# Patient Record
Sex: Female | Born: 1945 | Race: White | Hispanic: No | Marital: Married | Smoking: Never smoker
Health system: Southern US, Community
[De-identification: ages and names within clinical notes are randomized; demographics above are authoritative.]

## PROBLEM LIST (undated history)

## (undated) DIAGNOSIS — E7800 Pure hypercholesterolemia, unspecified: Secondary | ICD-10-CM

## (undated) DIAGNOSIS — C801 Malignant (primary) neoplasm, unspecified: Secondary | ICD-10-CM

## (undated) DIAGNOSIS — S85001A Unspecified injury of popliteal artery, right leg, initial encounter: Secondary | ICD-10-CM

## (undated) DIAGNOSIS — R7303 Prediabetes: Secondary | ICD-10-CM

## (undated) DIAGNOSIS — K631 Perforation of intestine (nontraumatic): Secondary | ICD-10-CM

## (undated) DIAGNOSIS — I480 Paroxysmal atrial fibrillation: Secondary | ICD-10-CM

## (undated) DIAGNOSIS — I10 Essential (primary) hypertension: Secondary | ICD-10-CM

## (undated) DIAGNOSIS — IMO0002 Reserved for concepts with insufficient information to code with codable children: Secondary | ICD-10-CM

## (undated) DIAGNOSIS — E876 Hypokalemia: Secondary | ICD-10-CM

## (undated) DIAGNOSIS — I4891 Unspecified atrial fibrillation: Secondary | ICD-10-CM

## (undated) DIAGNOSIS — R600 Localized edema: Secondary | ICD-10-CM

## (undated) DIAGNOSIS — S83106A Unspecified dislocation of unspecified knee, initial encounter: Secondary | ICD-10-CM

## (undated) DIAGNOSIS — E669 Obesity, unspecified: Secondary | ICD-10-CM

## (undated) DIAGNOSIS — A4902 Methicillin resistant Staphylococcus aureus infection, unspecified site: Secondary | ICD-10-CM

## (undated) DIAGNOSIS — K5792 Diverticulitis of intestine, part unspecified, without perforation or abscess without bleeding: Secondary | ICD-10-CM

## (undated) DIAGNOSIS — E785 Hyperlipidemia, unspecified: Secondary | ICD-10-CM

## (undated) DIAGNOSIS — K219 Gastro-esophageal reflux disease without esophagitis: Secondary | ICD-10-CM

## (undated) DIAGNOSIS — S85009A Unspecified injury of popliteal artery, unspecified leg, initial encounter: Secondary | ICD-10-CM

## (undated) DIAGNOSIS — I82409 Acute embolism and thrombosis of unspecified deep veins of unspecified lower extremity: Secondary | ICD-10-CM

## (undated) DIAGNOSIS — R519 Headache, unspecified: Secondary | ICD-10-CM

## (undated) DIAGNOSIS — E119 Type 2 diabetes mellitus without complications: Secondary | ICD-10-CM

## (undated) DIAGNOSIS — D72829 Elevated white blood cell count, unspecified: Secondary | ICD-10-CM

## (undated) DIAGNOSIS — M7989 Other specified soft tissue disorders: Secondary | ICD-10-CM

## (undated) DIAGNOSIS — R87619 Unspecified abnormal cytological findings in specimens from cervix uteri: Secondary | ICD-10-CM

## (undated) DIAGNOSIS — M1990 Unspecified osteoarthritis, unspecified site: Secondary | ICD-10-CM

## (undated) DIAGNOSIS — R739 Hyperglycemia, unspecified: Secondary | ICD-10-CM

## (undated) HISTORY — DX: Other specified soft tissue disorders: M79.89

## (undated) HISTORY — PX: NASAL SINUS SURGERY: SHX719

## (undated) HISTORY — DX: Diverticulitis of intestine, part unspecified, without perforation or abscess without bleeding: K57.92

## (undated) HISTORY — PX: FINGER SURGERY: SHX640

## (undated) HISTORY — DX: Unspecified injury of popliteal artery, right leg, initial encounter: S85.001A

## (undated) HISTORY — PX: TONSILLECTOMY: SUR1361

## (undated) HISTORY — DX: Localized edema: R60.0

## (undated) HISTORY — DX: Paroxysmal atrial fibrillation: I48.0

## (undated) HISTORY — DX: Pure hypercholesterolemia, unspecified: E78.00

## (undated) HISTORY — DX: Elevated white blood cell count, unspecified: D72.829

## (undated) HISTORY — PX: OTHER SURGICAL HISTORY: SHX169

## (undated) HISTORY — DX: Perforation of intestine (nontraumatic): K63.1

## (undated) HISTORY — DX: Unspecified injury of popliteal artery, unspecified leg, initial encounter: S85.009A

## (undated) HISTORY — PX: NEUROMA SURGERY: SHX722

## (undated) HISTORY — DX: Essential (primary) hypertension: I10

## (undated) HISTORY — DX: Reserved for concepts with insufficient information to code with codable children: IMO0002

## (undated) HISTORY — DX: Type 2 diabetes mellitus without complications: E11.9

## (undated) HISTORY — PX: MELANOMA EXCISION: SHX5266

## (undated) HISTORY — PX: ADENOIDECTOMY: SUR15

## (undated) HISTORY — PX: CATARACT EXTRACTION, BILATERAL: SHX1313

## (undated) HISTORY — DX: Unspecified atrial fibrillation: I48.91

## (undated) HISTORY — DX: Hyperglycemia, unspecified: R73.9

## (undated) HISTORY — DX: Malignant (primary) neoplasm, unspecified: C80.1

## (undated) HISTORY — DX: Hyperlipidemia, unspecified: E78.5

## (undated) HISTORY — PX: ABDOMINAL HYSTERECTOMY: SHX81

## (undated) HISTORY — DX: Unspecified abnormal cytological findings in specimens from cervix uteri: R87.619

## (undated) HISTORY — DX: Unspecified dislocation of unspecified knee, initial encounter: S83.106A

## (undated) HISTORY — DX: Obesity, unspecified: E66.9

## (undated) HISTORY — DX: Hypokalemia: E87.6

## (undated) HISTORY — DX: Unspecified osteoarthritis, unspecified site: M19.90

## (undated) HISTORY — PX: CHOLECYSTECTOMY: SHX55

---

## 1998-01-11 ENCOUNTER — Ambulatory Visit (HOSPITAL_COMMUNITY): Admission: RE | Admit: 1998-01-11 | Discharge: 1998-01-11 | Payer: Self-pay | Admitting: Gastroenterology

## 1998-01-20 ENCOUNTER — Observation Stay (HOSPITAL_COMMUNITY): Admission: EM | Admit: 1998-01-20 | Discharge: 1998-01-20 | Payer: Self-pay | Admitting: Emergency Medicine

## 1998-11-12 ENCOUNTER — Other Ambulatory Visit: Admission: RE | Admit: 1998-11-12 | Discharge: 1998-11-12 | Payer: Self-pay | Admitting: Gynecology

## 1999-04-18 ENCOUNTER — Encounter: Admission: RE | Admit: 1999-04-18 | Discharge: 1999-04-18 | Payer: Self-pay | Admitting: Gynecology

## 1999-04-18 ENCOUNTER — Encounter: Payer: Self-pay | Admitting: Gynecology

## 1999-05-07 ENCOUNTER — Encounter: Admission: RE | Admit: 1999-05-07 | Discharge: 1999-05-07 | Payer: Self-pay | Admitting: Family Medicine

## 1999-05-07 ENCOUNTER — Encounter: Payer: Self-pay | Admitting: Family Medicine

## 1999-06-10 ENCOUNTER — Other Ambulatory Visit: Admission: RE | Admit: 1999-06-10 | Discharge: 1999-06-10 | Payer: Self-pay | Admitting: Gynecology

## 1999-07-14 ENCOUNTER — Encounter: Payer: Self-pay | Admitting: *Deleted

## 1999-07-14 ENCOUNTER — Encounter: Admission: RE | Admit: 1999-07-14 | Discharge: 1999-07-14 | Payer: Self-pay | Admitting: *Deleted

## 2000-06-04 ENCOUNTER — Encounter: Admission: RE | Admit: 2000-06-04 | Discharge: 2000-06-04 | Payer: Self-pay | Admitting: Gynecology

## 2000-06-04 ENCOUNTER — Encounter: Payer: Self-pay | Admitting: Gynecology

## 2000-06-09 ENCOUNTER — Other Ambulatory Visit: Admission: RE | Admit: 2000-06-09 | Discharge: 2000-06-09 | Payer: Self-pay | Admitting: Gynecology

## 2001-06-07 ENCOUNTER — Encounter: Admission: RE | Admit: 2001-06-07 | Discharge: 2001-06-07 | Payer: Self-pay | Admitting: Gynecology

## 2001-06-07 ENCOUNTER — Encounter: Payer: Self-pay | Admitting: Gynecology

## 2001-06-21 ENCOUNTER — Other Ambulatory Visit: Admission: RE | Admit: 2001-06-21 | Discharge: 2001-06-21 | Payer: Self-pay | Admitting: Gynecology

## 2001-11-11 ENCOUNTER — Encounter (INDEPENDENT_AMBULATORY_CARE_PROVIDER_SITE_OTHER): Payer: Self-pay | Admitting: Specialist

## 2001-11-11 ENCOUNTER — Observation Stay (HOSPITAL_COMMUNITY): Admission: RE | Admit: 2001-11-11 | Discharge: 2001-11-12 | Payer: Self-pay

## 2002-06-09 ENCOUNTER — Encounter: Admission: RE | Admit: 2002-06-09 | Discharge: 2002-06-09 | Payer: Self-pay | Admitting: Gynecology

## 2002-06-09 ENCOUNTER — Encounter: Payer: Self-pay | Admitting: Gynecology

## 2002-07-25 ENCOUNTER — Other Ambulatory Visit: Admission: RE | Admit: 2002-07-25 | Discharge: 2002-07-25 | Payer: Self-pay | Admitting: Gynecology

## 2002-09-28 ENCOUNTER — Encounter: Payer: Self-pay | Admitting: Gynecology

## 2002-09-28 ENCOUNTER — Encounter: Admission: RE | Admit: 2002-09-28 | Discharge: 2002-09-28 | Payer: Self-pay | Admitting: Gynecology

## 2003-06-26 ENCOUNTER — Encounter: Admission: RE | Admit: 2003-06-26 | Discharge: 2003-06-26 | Payer: Self-pay | Admitting: Gynecology

## 2003-07-06 ENCOUNTER — Encounter: Admission: RE | Admit: 2003-07-06 | Discharge: 2003-07-06 | Payer: Self-pay | Admitting: Gynecology

## 2003-07-23 ENCOUNTER — Ambulatory Visit (HOSPITAL_COMMUNITY): Admission: RE | Admit: 2003-07-23 | Discharge: 2003-07-23 | Payer: Self-pay | Admitting: Gastroenterology

## 2003-09-11 ENCOUNTER — Other Ambulatory Visit: Admission: RE | Admit: 2003-09-11 | Discharge: 2003-09-11 | Payer: Self-pay | Admitting: Gynecology

## 2004-07-09 ENCOUNTER — Encounter: Admission: RE | Admit: 2004-07-09 | Discharge: 2004-07-09 | Payer: Self-pay | Admitting: Gynecology

## 2004-09-17 ENCOUNTER — Other Ambulatory Visit: Admission: RE | Admit: 2004-09-17 | Discharge: 2004-09-17 | Payer: Self-pay | Admitting: Gynecology

## 2005-07-29 ENCOUNTER — Encounter: Admission: RE | Admit: 2005-07-29 | Discharge: 2005-07-29 | Payer: Self-pay | Admitting: Gynecology

## 2005-11-18 ENCOUNTER — Other Ambulatory Visit: Admission: RE | Admit: 2005-11-18 | Discharge: 2005-11-18 | Payer: Self-pay | Admitting: Gynecology

## 2006-06-16 ENCOUNTER — Emergency Department (HOSPITAL_COMMUNITY): Admission: EM | Admit: 2006-06-16 | Discharge: 2006-06-17 | Payer: Self-pay | Admitting: Emergency Medicine

## 2006-08-20 ENCOUNTER — Encounter: Admission: RE | Admit: 2006-08-20 | Discharge: 2006-08-20 | Payer: Self-pay | Admitting: Family Medicine

## 2006-12-23 ENCOUNTER — Other Ambulatory Visit: Admission: RE | Admit: 2006-12-23 | Discharge: 2006-12-23 | Payer: Self-pay | Admitting: Gynecology

## 2007-09-01 ENCOUNTER — Encounter: Admission: RE | Admit: 2007-09-01 | Discharge: 2007-09-01 | Payer: Self-pay | Admitting: Family Medicine

## 2007-12-14 ENCOUNTER — Other Ambulatory Visit: Admission: RE | Admit: 2007-12-14 | Discharge: 2007-12-14 | Payer: Self-pay | Admitting: Obstetrics and Gynecology

## 2008-09-04 ENCOUNTER — Encounter: Admission: RE | Admit: 2008-09-04 | Discharge: 2008-09-04 | Payer: Self-pay | Admitting: Family Medicine

## 2008-12-27 ENCOUNTER — Inpatient Hospital Stay (HOSPITAL_COMMUNITY): Admission: RE | Admit: 2008-12-27 | Discharge: 2008-12-31 | Payer: Self-pay | Admitting: Orthopedic Surgery

## 2008-12-27 HISTORY — PX: TOTAL KNEE ARTHROPLASTY: SHX125

## 2009-05-22 ENCOUNTER — Encounter: Admission: RE | Admit: 2009-05-22 | Discharge: 2009-05-22 | Payer: Self-pay | Admitting: Obstetrics and Gynecology

## 2009-06-20 ENCOUNTER — Inpatient Hospital Stay (HOSPITAL_COMMUNITY): Admission: RE | Admit: 2009-06-20 | Discharge: 2009-06-23 | Payer: Self-pay | Admitting: Orthopedic Surgery

## 2009-06-20 HISTORY — PX: TOTAL KNEE ARTHROPLASTY: SHX125

## 2009-09-10 ENCOUNTER — Encounter: Admission: RE | Admit: 2009-09-10 | Discharge: 2009-09-10 | Payer: Self-pay | Admitting: Obstetrics and Gynecology

## 2010-02-19 ENCOUNTER — Ambulatory Visit: Payer: Self-pay | Admitting: Cardiovascular Disease

## 2010-06-24 ENCOUNTER — Telehealth (INDEPENDENT_AMBULATORY_CARE_PROVIDER_SITE_OTHER): Payer: Self-pay | Admitting: Radiology

## 2010-06-25 ENCOUNTER — Encounter (HOSPITAL_COMMUNITY)
Admission: RE | Admit: 2010-06-25 | Discharge: 2010-07-08 | Payer: Self-pay | Source: Home / Self Care | Attending: Cardiovascular Disease | Admitting: Cardiovascular Disease

## 2010-06-25 ENCOUNTER — Encounter: Payer: Self-pay | Admitting: Internal Medicine

## 2010-06-25 ENCOUNTER — Ambulatory Visit: Admission: RE | Admit: 2010-06-25 | Discharge: 2010-06-25 | Payer: Self-pay | Source: Home / Self Care

## 2010-06-26 ENCOUNTER — Ambulatory Visit: Admission: RE | Admit: 2010-06-26 | Discharge: 2010-06-26 | Payer: Self-pay | Source: Home / Self Care

## 2010-07-08 ENCOUNTER — Ambulatory Visit: Payer: Self-pay | Admitting: Cardiovascular Disease

## 2010-07-10 NOTE — Assessment & Plan Note (Signed)
Summary: Cardiology Nuclear Testing  Nuclear Med Background Indications for Stress Test: Evaluation for Ischemia, Surgical Clearance  Indications Comments: Pending hysterectomy on 07/22/10 by Dr. Meredeth Ide (GYN)  History: Echo, Myocardial Perfusion Study  History Comments: '03 MPS nml.-EF=63%  '08Echo=55-60%/mild AS. HX. PAF  Symptoms: Chest Pain, Chest Tightness, Chest Tightness with Exertion, DOE, Nausea, Rapid HR, SOB  Symptoms Comments: Rt sided CP, last episode one week ago.   Nuclear Pre-Procedure Cardiac Risk Factors: Hypertension, NIDDM Caffeine/Decaff Intake: None NPO After: 7:30 PM Lungs: Clear IV 0.9% NS with Angio Cath: 22g     IV Site: R Antecubital IV Started by: Irean Hong, RN Chest Size (in) 46     Cup Size D     Height (in): 70 Weight (lb): 283 BMI: 40.75  Nuclear Med Study 1 or 2 day study:  2 day     Stress Test Type:  Treadmill/Lexiscan Reading MD:  Dietrich Pates, MD     Referring MD:  Katherina Right Resting Radionuclide:  Technetium 9m Tetrofosmin     Resting Radionuclide Dose:  33 mCi  Stress Radionuclide:  Technetium 9m Tetrofosmin     Stress Radionuclide Dose:  33 mCi   Stress Protocol      Max HR:  115 bpm     Predicted Max HR:  157 bpm  Max Systolic BP: 157 mm Hg     Percent Max HR:  73.25 %Rate Pressure Product:  16109  Lexiscan: 0.4 mg   Stress Test Technologist:  Irean Hong,  RN     Nuclear Technologist:  Doyne Keel, CNMT  Rest Procedure  Myocardial perfusion imaging was performed at rest 45 minutes following the intravenous administration of Technetium 16m Tetrofosmin.  Stress Procedure  The patient received IV Lexiscan 0.4 mg over 15-seconds with concurrent low level exercise and then Technetium 95m Tetrofosmin was injected at 30-seconds. There were nonspecific T wave changes. The patient had mild right sided chest pain with lexiscan. Quantitative spect images were obtained after a 45 minute delay.  QPS Raw Data Images:  Images  were motion corrected.  SOft tissue (diaphragm, breast) surround heart. Stress Images:  Defect in the inferior wall (base, mid, distal); minimally apex.  Otherwise normal perfusion. Rest Images:  Improvemnet with increased counts in the inferior wall (mid (minimally), distal and apex. Transient Ischemic Dilatation:  .91  (Normal <1.22)  Lung/Heart Ratio:  .31  (Normal <0.45)  Quantitative Gated Spect Images QGS EDV:  115 ml QGS ESV:  41 ml QGS EF:  64 % QGS cine images:  Normal wall motion.   Overall Impression  Exercise Capacity: Lexiscan with low level exercise BP Response: Normal blood pressure response. Clinical Symptoms: Right sided chest pain. ECG Impression: No significant ST segment change suggestive of ischemia. Overall Impression Comments: Inferior changes consistent with soft tissue attenuation and possible subendocardial scar with concomitant mid/distal inferior and apical ischemia.   Appended Document: Cardiology Nuclear Testing COPY SENT TO DR. Melburn Popper

## 2010-07-10 NOTE — Progress Notes (Signed)
Summary: nuc pre-procedure  Phone Note Outgoing Call   Call placed by: Domenic Polite, CNMT,  June 24, 2010 3:59 PM Call placed to: Patient Reason for Call: Confirm/change Appt Summary of Call: Left message with information on Myoview Information Sheet (see scanned document for details).      Nuclear Med Background Indications for Stress Test: Evaluation for Ischemia   History: Echo, Myocardial Perfusion Study  History Comments: '03 MPS nml.-EF=63%; '08 Echo=55-60%/mild AS; PAF     Nuclear Pre-Procedure Cardiac Risk Factors: Hypertension

## 2010-07-11 ENCOUNTER — Inpatient Hospital Stay (HOSPITAL_BASED_OUTPATIENT_CLINIC_OR_DEPARTMENT_OTHER)
Admission: RE | Admit: 2010-07-11 | Discharge: 2010-07-11 | Disposition: A | Discharge status: Home / Self Care | Payer: BC Managed Care – PPO | Attending: Cardiovascular Disease | Admitting: Cardiovascular Disease

## 2010-07-11 DIAGNOSIS — R079 Chest pain, unspecified: Secondary | ICD-10-CM | POA: Insufficient documentation

## 2010-07-11 DIAGNOSIS — E669 Obesity, unspecified: Secondary | ICD-10-CM | POA: Insufficient documentation

## 2010-07-11 DIAGNOSIS — I10 Essential (primary) hypertension: Secondary | ICD-10-CM | POA: Insufficient documentation

## 2010-07-11 DIAGNOSIS — Z96659 Presence of unspecified artificial knee joint: Secondary | ICD-10-CM | POA: Insufficient documentation

## 2010-07-11 DIAGNOSIS — R9430 Abnormal result of cardiovascular function study, unspecified: Secondary | ICD-10-CM | POA: Insufficient documentation

## 2010-07-11 DIAGNOSIS — I4891 Unspecified atrial fibrillation: Secondary | ICD-10-CM | POA: Insufficient documentation

## 2010-07-11 HISTORY — PX: CARDIAC CATHETERIZATION: SHX172

## 2010-07-14 LAB — POCT I-STAT GLUCOSE
Glucose, Bld: 110 mg/dL — ABNORMAL HIGH (ref 70–99)
Operator id: 221371

## 2010-07-15 ENCOUNTER — Encounter (HOSPITAL_COMMUNITY)
Admission: RE | Admit: 2010-07-15 | Discharge: 2010-07-15 | Disposition: A | Discharge status: Home / Self Care | Payer: BC Managed Care – PPO | Source: Ambulatory Visit

## 2010-07-15 DIAGNOSIS — Z01812 Encounter for preprocedural laboratory examination: Secondary | ICD-10-CM | POA: Insufficient documentation

## 2010-07-15 LAB — COMPREHENSIVE METABOLIC PANEL
ALT: 26 U/L (ref 0–35)
AST: 24 U/L (ref 0–37)
Albumin: 3.9 g/dL (ref 3.5–5.2)
Alkaline Phosphatase: 75 U/L (ref 39–117)
Calcium: 9.6 mg/dL (ref 8.4–10.5)
GFR calc Af Amer: >60 mL/min (ref >60)
Potassium: 3.1 mEq/L — ABNORMAL LOW (ref 3.5–5.1)
Sodium: 138 mEq/L (ref 135–145)
Total Protein: 6.9 g/dL (ref 6.0–8.3)

## 2010-07-15 LAB — CBC
MCHC: 33.5 g/dL (ref 30.0–36.0)
Platelets: 236 10*3/uL (ref 150–400)
RDW: 14.5 % (ref 11.5–15.5)
WBC: 6.2 10*3/uL (ref 4.0–10.5)

## 2010-07-15 LAB — SURGICAL PCR SCREEN: Staphylococcus aureus: NEGATIVE

## 2010-07-18 NOTE — H&P (Signed)
NAME:  AALINA, BREGE NO.:  [id number]  MEDICAL RECORD NO.:  [id number]           PATIENT TYPE:  LOCATION:                                 FACILITY:  PHYSICIAN:  Vesta Mixer, M.D. DATE OF BIRTH:  [birth year]  DATE OF ADMISSION:  07/11/2010 DATE OF DISCHARGE:                             HISTORY & PHYSICAL   [name] is a middle-aged female with a history of some chest discomfort.  She recently had an abnormal stress Myoview study and is now admitted for cardiac catheterization.  [name] is a middle-aged female with a history of hypertension, obesity, arthritis, chest pain, intermittent atrial fibrillation, and a knee replacement.  She has been having some episodes of right chest and right shoulder pain.  The pain radiates out to her right arm.  It also is associated with some back pain.  She denies any shortness of breath. She has had some nausea.  The pain also makes her fatigued, and she just does not feel right.  She is scheduled to have a hysterectomy next month.  We scheduled her for a stress Myoview study for the purpose of preoperative evaluation and also to evaluate her episodes of chest pain. The stress Myoview study revealed inferior wall ischemia.  She has normal left ventricular systolic function with an ejection fraction of 64%.  CURRENT MEDICATIONS: 1. Maxzide 75 mg/50 mg once a day. 2. Calcium and vitamin D twice a day. 3. Aspirin 325 mg a day. 4. Multivitamin once a day. 5. Fish oil once a day. 6. Diltiazem slow release 240 mg a day. 7. Vitamin D 50,000 International Units a week.  ALLERGIES:  She is allergic to SULFA.  PLAVIX causes itching. ERYTHROMYCIN causes nausea.  PAST MEDICAL HISTORY: 1. Hypertension. 2. Obesity. 3. Arthritis. 4. History of chest pain. 5. Intermittent atrial fibrillation. 6. History of left knee replacement.  FAMILY HISTORY:  Her father died at age 33 of emphysema.  Her mother has a history of  pacemaker.  SOCIAL HISTORY:  The patient is a nonsmoker.  She is a [employer] scheme manager.  REVIEW OF SYSTEMS:  Reported in the HPI.  All other systems are negative.  PHYSICAL EXAMINATION:  GENERAL:  She is a middle-aged female, in no acute distress.  She is alert and oriented x3.  Mood and affect are normal. VITAL SIGNS:  Her weight is 283.  Her blood pressure is 130/78.  Her heart rate is 76. HEENT AND NECK:  Her carotids exam is normal.  She has no bruits.  Her neck is supple.  Her sclerae are nonicteric.  There is no thyromegaly. No JVD. LUNGS:  Clear. HEART:  Regular rate S1 and S2.  There are no murmurs.  The PMI is nondisplaced. ABDOMEN:  Good bowel sounds and is nontender.  There is no hepatosplenomegaly. EXTREMITIES:  She has no edema.  There are no palpable cords.  Her pulses are very deep. NEUROLOGIC:  Cranial nerves II through XII are intact, and motor and sensory function intact.  Stress Myoview reveals inferior wall ischemia.  She has normal left ventricular systolic function.  Laboratory  data is pending.  [name] presents with a rather atypical episode of chest pain but an abnormal stress Myoview study.  I cannot completely exclude diaphragmatic attenuation.  We will schedule her for heart catheterization for further evaluation.  I discussed the risks, benefits, and options of heart catheterization.  She understands and agrees to proceed.     Vesta Mixer, M.D.     PJN/MEDQ  D:  07/08/2010  T:  07/09/2010  Job:  [id number]  cc:   Windle Guard, M.D.  Electronically Signed by Kristeen Miss M.D. on 07/18/2010 04:50:57 PM

## 2010-07-20 NOTE — Procedures (Signed)
  NAME:  TIPHANY, FAYSON NO.:  [id number]  MEDICAL RECORD NO.:  [id number]           PATIENT TYPE:  LOCATION:                                 FACILITY:  PHYSICIAN:  Vesta Mixer, M.D. DATE OF BIRTH:  April 10, 1946  DATE OF PROCEDURE:  07/11/2010 DATE OF DISCHARGE:                           CARDIAC CATHETERIZATION   [name] is a middle-aged female with a history of atrial fibrillation, hypertension, obesity, and status post knee replacement. She has had some episodes of chest pain.  She is scheduled to have a hysterectomy.  She had a preoperative stress Myoview study which revealed some abnormalities.  She is scheduled for heart catheterization for further evaluation.  PROCEDURE:  Left heart catheterization with coronary angiography.  The right femoral artery was easily cannulated using the modified Seldinger technique.  HEMODYNAMICS:  The LV pressure is 138/13.  The aortic pressure is 139/75.  ANGIOGRAPHY: 1. Left main:  The left main is smooth and normal. 2. Left anterior descending artery:  The LAD is smooth and normal.     There is a large first diagonal artery which is also smooth and     normal. 3. Ramus intermediate branch:  The ramus intermediate branch is smooth     and normal. 4. Left circumflex artery:  Left circumflex artery is smooth and     normal.  It primarily provides flow to an obtuse marginal branch.     The continuation branch is a very tiny branch that does not give     off any additional marginal branches. 5. Right coronary artery:  The right coronary artery is smooth and     normal.  It is dominant.  The posterior descending artery and the     posterolateral segment artery are normal. 6. Left ventriculogram:  The left ventriculogram was performed in 30     RAO position.  It reveals normal left ventricular systolic     function.  The ejection fraction is around 60% to 65%.  COMPLICATIONS:  None.  CONCLUSION: 1. Smooth and  normal coronary arteries. 2. Normal left ventricular systolic function.  She should be at low     risk for her upcoming surgery.     Vesta Mixer, M.D.     PJN/MEDQ  D:  07/11/2010  T:  07/11/2010  Job:  [id number]  cc:   Windle Guard, M.D.  Electronically Signed by Kristeen Miss M.D. on 07/18/2010 04:50:56 PM

## 2010-07-22 ENCOUNTER — Other Ambulatory Visit: Payer: Self-pay | Admitting: Obstetrics and Gynecology

## 2010-07-22 ENCOUNTER — Ambulatory Visit (HOSPITAL_COMMUNITY)
Admission: RE | Admit: 2010-07-22 | Discharge: 2010-07-23 | Disposition: A | Discharge status: Home / Self Care | Payer: BC Managed Care – PPO | Source: Ambulatory Visit | Attending: Obstetrics and Gynecology | Admitting: Obstetrics and Gynecology

## 2010-07-22 DIAGNOSIS — I4891 Unspecified atrial fibrillation: Secondary | ICD-10-CM | POA: Insufficient documentation

## 2010-07-22 DIAGNOSIS — I10 Essential (primary) hypertension: Secondary | ICD-10-CM | POA: Insufficient documentation

## 2010-07-22 DIAGNOSIS — N812 Incomplete uterovaginal prolapse: Secondary | ICD-10-CM | POA: Insufficient documentation

## 2010-07-22 DIAGNOSIS — E669 Obesity, unspecified: Secondary | ICD-10-CM | POA: Insufficient documentation

## 2010-07-22 DIAGNOSIS — Z01812 Encounter for preprocedural laboratory examination: Secondary | ICD-10-CM | POA: Insufficient documentation

## 2010-07-22 HISTORY — PX: CYSTOCELE REPAIR: SHX163

## 2010-07-22 HISTORY — PX: TOTAL VAGINAL HYSTERECTOMY: SHX2548

## 2010-07-22 LAB — BASIC METABOLIC PANEL
Calcium: 9.2 mg/dL (ref 8.4–10.5)
GFR calc Af Amer: >60 mL/min (ref >60)
GFR calc non Af Amer: >60 mL/min (ref >60)
Glucose, Bld: 154 mg/dL — ABNORMAL HIGH (ref 70–99)
Sodium: 139 mEq/L (ref 135–145)

## 2010-07-22 LAB — GLUCOSE, CAPILLARY

## 2010-07-22 LAB — HEMOGLOBIN AND HEMATOCRIT, BLOOD
HCT: 42.9 % (ref 36.0–46.0)
Hemoglobin: 13.9 g/dL (ref 12.0–15.0)

## 2010-07-23 LAB — BASIC METABOLIC PANEL
BUN: 7 mg/dL (ref 6–23)
CO2: 35 mEq/L — ABNORMAL HIGH (ref 19–32)
Calcium: 9 mg/dL (ref 8.4–10.5)
Chloride: 101 mEq/L (ref 96–112)
Creatinine, Ser: 0.73 mg/dL (ref 0.4–1.2)
GFR calc Af Amer: >60 mL/min (ref >60)
GFR calc non Af Amer: >60 mL/min (ref >60)
Glucose, Bld: 121 mg/dL — ABNORMAL HIGH (ref 70–99)
Potassium: 3.2 mEq/L — ABNORMAL LOW (ref 3.5–5.1)
Sodium: 140 mEq/L (ref 135–145)

## 2010-07-23 LAB — CBC
HCT: 37.8 % (ref 36.0–46.0)
Platelets: 188 10*3/uL (ref 150–400)
RBC: 4.17 MIL/uL (ref 3.87–5.11)
RDW: 13.8 % (ref 11.5–15.5)
WBC: 10.1 10*3/uL (ref 4.0–10.5)

## 2010-07-23 NOTE — Op Note (Signed)
NAMENA, WALDRIP NO.:  [id number]  MEDICAL RECORD NO.:  [id number]           PATIENT TYPE:  I  LOCATION:  9304                          FACILITY:  [facility]  PHYSICIAN:  Cynthia P. Romine, M.D.DATE OF BIRTH:  05-Sep-1945  DATE OF PROCEDURE:  07/22/2010 DATE OF DISCHARGE:                              OPERATIVE REPORT   PREOPERATIVE DIAGNOSIS:  Pelvic prolapse.  POSTOPERATIVE DIAGNOSIS:  Pelvic prolapse, pathology pending.  PROCEDURE:  Laparoscopically-assisted total vaginal hysterectomy with bilateral salpingo-oophorectomy by Dr. Tresa Res.  Pelvic repair done by Dr. Lorin Picket MacDiarmid to be dictated separately.  SURGEON:  Cynthia P. Romine, MD  ASSISTANT:  Lum Keas, MD  ANESTHESIA:  General endotracheal.  ESTIMATED BLOOD LOSS:  150 mL.  COMPLICATIONS:  None.  PROCEDURE:  The patient was taken to the operating room and after induction of adequate general anesthesia was placed in the low dorsal lithotomy position and prepped and draped in the usual fashion.  A posterior weighted and anterior Deaver was placed.  The cervix was grasped at the anterior lip with a single-tooth tenaculum.  A Hulka uterine manipulator was placed.  The bladder was then drained with a Foley catheter.  Attention was then turned to the abdomen.  An area vertically just below the umbilicus was anesthetized with 1% Xylocaine, incised with a knife.  The Veress needle was inserted into peritoneal space.  Proper placement was tested using the hanging drop technique. When the gas was turned on, the pressure was over 15 and it was felt that the Veress needle was probably in the subcutaneous space since this patient had a rather obese abdomen.  The Veress needle was removed and it was reinserted making very certain to keep the angle of the Veress needle rather vertical and at this time the Veress needle was indeed in the peritoneal space.  A hanging drop test was done and the gas  was turned on and pneumoperitoneum was achieved using 3 liters of CO2.  A 10/11 mm bladed trocar was then introduced, but likely Veress needle was again found in the preperitoneal space, it was removed and a long 11/12 mm trocar was reinserted with good placement in the peritoneal cavity and placement documented with the laparoscope.  The abdominal wall was then transilluminated and 2 small areas in the right and left lower quadrant were identified that were free of vessels that were anesthetized with 1% Xylocaine, incised with a knife, and two 5-mm trocars were inserted under direct visualization.  The pelvis was inspected.  The uterus, tubes, and ovaries were all freely mobile.  The anterior and posterior cul-de-sacs were normal.  The ureter on the patient's right was identified.  The infundibulopelvic ligament was put on stretch, cauterized with a gyrus and cut.  The mesosalpinx and the round ligament were likewise cauterized and cut sequentially.  The procedure was then repeated on the patient's left identifying the ureter, cauterizing and cutting the infundibulopelvic ligament, mesosalpinx, and round ligament.  A small amount of broad ligament was also taken down with the gyrus.  The pedicles were inspected.  There was some small amount  of bleeding from the round ligament on the patient's right and this was re-cauterized.  There was also a very small amount of bleeding from the infundibulopelvic on the patient's left, which was re- cauterized and the pedicles were absolutely hemostatic.  The pneumoperitoneum was allowed to partially escape and the pedicles were reinspected and were still hemostatic.  The instruments were all removed from the abdomen including the sleeves.  Before the umbilical sleeve was removed, the patient was given a couple of manual deep breaths and the abdomen was left with the belly gas.  The sleeve was removed.  The deep stitch was placed in the umbilicus and  then the skin and the umbilicus was closed with 4-0 Vicryl Rapide and then all incisions were closed with off the field.  Attention was then turned vaginally.  The Hulka manipulator was removed.  A posterior weighted and anterior Deaver were placed.  The cervix was grasped on its anterior lip with a Jacobs tenaculum.  The mucosa over the cervix was infiltrated with 1% Xylocaine with epinephrine and incised with a knife from 9-3 o'clock.  Mayo scissors were used to dissect the bladder off the cervix as was blunt dissection using a sponge.  A posterior colpotomy incision was made next and the banana was placed in the posterior peritoneal space.  The uterosacral ligaments were clamped, cut and tied as were the pedicles going up the cardinal ligament.  The uterine arteries on either side were clamped, cut, and doubly tied.  There was a small amount of broad ligament left on either side that was clamped, cut, and tied and the specimen was then removed, uterus, cervix, tubes, and ovaries.  The vaginal cuff was then run with a running locking suture of 0 Vicryl from 3 o'clock to 9 o'clock.  An antienterocele stitch was placed to bring the uterosacral ligaments together going into the posterior peritoneal space from vagina, suturing through the left uterosacral ligament, skimming across peritoneum, suturing through the right, uterosacral ligament coming out again in the midline and tying.  The wound was irrigated.  It was felt to be hemostatic and then Dr. Sherron Monday came in to proceed with his part of the procedure which will be dictated separately.  Estimated blood loss for this part of the procedure was 150 mL.  There were no complications, and sponge, needle, and instrument counts were correct.     Cynthia P. Romine, M.D.     CPR/MEDQ  D:  07/22/2010  T:  07/23/2010  Job:  [id number]  Electronically Signed by Meredeth Ide M.D. on 07/23/2010 10:38:21 AM

## 2010-07-28 NOTE — Op Note (Signed)
[name], [name] NO.:  [id number]  MEDICAL RECORD NO.:  [id number]           PATIENT TYPE:  I  LOCATION:  9304                          FACILITY:  [facility]  PHYSICIAN:  Martina Sinner, MD DATE OF BIRTH:  05-May-1946  DATE OF PROCEDURE:  07/22/2010 DATE OF DISCHARGE:                              OPERATIVE REPORT   ASSISTANT:  Delia Chimes, NP  PREOPERATIVE DIAGNOSES:  Prolapse, cystocele repair and small rectocele.  POSTOPERATIVE DIAGNOSES:  Prolapse, cystocele repair and small rectocele.  SURGERY:  Vault suspension, cystocele repair, graft and cystoscopy.  Ms. Kolk underwent a transvaginal hysterectomy by Dr. Tresa Res.  She had run the posterior cuff between approximately 4 and 8 o'clock.  She had commented that there was some bulging at the apex posteriorly, where she had done the [facility].  Leg position was good.  Preoperative antibiotics were given.  Preoperative laboratory tests were normal.  I placed 2 Allis clamps at 2 and 10 o'clock and made a midline incision appropriate depth after instilling 25 mL of lidocaine epinephrine mixture.  I dissected the underlying pubocervical fascia from the overlying thick vaginal wall mucosa to nearly the white line bilaterally with sharp and blunt dissection.  There was clumping of tissue near her right apex that they do not take down since it was more posterior.  I could finger dissect to the ischial spine and sacrospinous ligament bilaterally.  There was very little bleeding.  She really had a short anterior vaginal wall with a small central defect and I did a two-layer repair with running 2-0 Vicryl not imbricating the bladder neck.  I then cystoscoped the patient.  Cystoscopically, the cystocele repair looked good.  There was excellent blue jets bilaterally with no distortion of the ureteral orifices and no distortion to the floor of the bladder on either side.  With a Capio device, I placed  a 0 Ethibond one fingerbreadth medial to the ischial spine in a straight line between the 2 spines.  I did 2 or 3 rectal examinations and I was very happy that there was no rectal injury and the suture was outside the rectum with no blood in the gloves.  I placed a 0 Vicryl on the UR6 needle into the pelvic sidewall near the urethrovesical angle.  I used my typical 10 x 6 dermal graft and cut in the shape of a trapezoid and sewed in place not under tension.  It fit very nicely.  I trimmed a minimal amount of anterior vaginal wall mucosa and closed it with a running 2-0 Vicryl on a CT1 needle.  She had a very heaped up vaginal cuff and I closed this with 0 Vicryl on a CT1 needle.  I repeated the rectal examination and she had minimal rectocele.  She lost a little bit of vaginal length and I did not want to narrow her vagina down further.  She did not have a distal rectocele.  I was very pleased with the anatomic repair.  Vaginal pack with Estrace cream was applied.  Leg position was the same at the end of the case.  ______________________________ Martina Sinner, MD     SAM/MEDQ  D:  07/22/2010  T:  07/23/2010  Job:  [id number]  Electronically Signed by Alfredo Martinez MD on 07/28/2010 05:19:22 PM

## 2010-08-24 LAB — COMPREHENSIVE METABOLIC PANEL
ALT: 21 U/L (ref 0–35)
Alkaline Phosphatase: 74 U/L (ref 39–117)
CO2: 30 mEq/L (ref 19–32)
GFR calc non Af Amer: >60 mL/min (ref >60)
Glucose, Bld: 99 mg/dL (ref 70–99)
Potassium: 3.5 mEq/L (ref 3.5–5.1)
Sodium: 140 mEq/L (ref 135–145)
Total Bilirubin: 0.4 mg/dL (ref 0.3–1.2)

## 2010-08-24 LAB — CBC
HCT: 32.2 % — ABNORMAL LOW (ref 36.0–46.0)
HCT: 27.1 % — ABNORMAL LOW (ref 36.0–46.0)
Hemoglobin: 11 g/dL — ABNORMAL LOW (ref 12.0–15.0)
Hemoglobin: 14.1 g/dL (ref 12.0–15.0)
Hemoglobin: 9.7 g/dL — ABNORMAL LOW (ref 12.0–15.0)
Hemoglobin: 9.3 g/dL — ABNORMAL LOW (ref 12.0–15.0)
MCHC: 34 g/dL (ref 30.0–36.0)
MCHC: 34.2 g/dL (ref 30.0–36.0)
MCV: 88.5 fL (ref 78.0–100.0)
MCV: 88.9 fL (ref 78.0–100.0)
RBC: 3.22 MIL/uL — ABNORMAL LOW (ref 3.87–5.11)
RBC: 3.63 MIL/uL — ABNORMAL LOW (ref 3.87–5.11)
RBC: 4.65 MIL/uL (ref 3.87–5.11)
RBC: 3.07 MIL/uL — ABNORMAL LOW (ref 3.87–5.11)
RDW: 14.7 % (ref 11.5–15.5)
WBC: 9.7 10*3/uL (ref 4.0–10.5)

## 2010-08-24 LAB — PROTIME-INR
INR: 1.03 (ref 0.00–1.49)
INR: 1.51 — ABNORMAL HIGH (ref 0.00–1.49)
Prothrombin Time: 12.6 seconds (ref 11.6–15.2)
Prothrombin Time: 14.8 seconds (ref 11.6–15.2)

## 2010-08-24 LAB — GLUCOSE, CAPILLARY: Glucose-Capillary: 99 mg/dL (ref 70–99)

## 2010-08-24 LAB — BASIC METABOLIC PANEL
CO2: 28 mEq/L (ref 19–32)
CO2: 29 mEq/L (ref 19–32)
Chloride: 104 mEq/L (ref 96–112)
Chloride: 106 mEq/L (ref 96–112)
Creatinine, Ser: 0.75 mg/dL (ref 0.4–1.2)
GFR calc Af Amer: >60 mL/min (ref >60)
GFR calc Af Amer: >60 mL/min (ref >60)
Glucose, Bld: 132 mg/dL — ABNORMAL HIGH (ref 70–99)
Potassium: 4.2 mEq/L (ref 3.5–5.1)
Sodium: 138 mEq/L (ref 135–145)
Sodium: 141 mEq/L (ref 135–145)

## 2010-08-24 LAB — URINALYSIS, ROUTINE W REFLEX MICROSCOPIC
Glucose, UA: NEGATIVE mg/dL
Hgb urine dipstick: NEGATIVE
Ketones, ur: NEGATIVE mg/dL
Protein, ur: NEGATIVE mg/dL

## 2010-08-25 ENCOUNTER — Other Ambulatory Visit: Payer: Self-pay | Admitting: Obstetrics and Gynecology

## 2010-08-25 DIAGNOSIS — Z1231 Encounter for screening mammogram for malignant neoplasm of breast: Secondary | ICD-10-CM

## 2010-09-14 LAB — PROTIME-INR
INR: 0.9 (ref 0.00–1.49)
INR: 1 (ref 0.00–1.49)
INR: 1.2 (ref 0.00–1.49)
INR: 1.2 (ref 0.00–1.49)
INR: 1.4 (ref 0.00–1.49)
Prothrombin Time: 12.3 seconds (ref 11.6–15.2)
Prothrombin Time: 17.4 seconds — ABNORMAL HIGH (ref 11.6–15.2)

## 2010-09-14 LAB — GLUCOSE, CAPILLARY
Glucose-Capillary: 101 mg/dL — ABNORMAL HIGH (ref 70–99)
Glucose-Capillary: 113 mg/dL — ABNORMAL HIGH (ref 70–99)
Glucose-Capillary: 113 mg/dL — ABNORMAL HIGH (ref 70–99)
Glucose-Capillary: 114 mg/dL — ABNORMAL HIGH (ref 70–99)
Glucose-Capillary: 119 mg/dL — ABNORMAL HIGH (ref 70–99)
Glucose-Capillary: 124 mg/dL — ABNORMAL HIGH (ref 70–99)
Glucose-Capillary: 128 mg/dL — ABNORMAL HIGH (ref 70–99)
Glucose-Capillary: 137 mg/dL — ABNORMAL HIGH (ref 70–99)
Glucose-Capillary: 148 mg/dL — ABNORMAL HIGH (ref 70–99)

## 2010-09-14 LAB — COMPREHENSIVE METABOLIC PANEL
Alkaline Phosphatase: 67 U/L (ref 39–117)
BUN: 23 mg/dL (ref 6–23)
Chloride: 105 mEq/L (ref 96–112)
Glucose, Bld: 97 mg/dL (ref 70–99)
Potassium: 4.4 mEq/L (ref 3.5–5.1)
Total Bilirubin: 0.8 mg/dL (ref 0.3–1.2)

## 2010-09-14 LAB — CBC
HCT: 28.9 % — ABNORMAL LOW (ref 36.0–46.0)
HCT: 29.8 % — ABNORMAL LOW (ref 36.0–46.0)
HCT: 41.5 % (ref 36.0–46.0)
Hemoglobin: 10.3 g/dL — ABNORMAL LOW (ref 12.0–15.0)
Hemoglobin: 11.1 g/dL — ABNORMAL LOW (ref 12.0–15.0)
Hemoglobin: 14.2 g/dL (ref 12.0–15.0)
Hemoglobin: 9.8 g/dL — ABNORMAL LOW (ref 12.0–15.0)
MCHC: 34.1 g/dL (ref 30.0–36.0)
MCV: 91.6 fL (ref 78.0–100.0)
RBC: 3.26 MIL/uL — ABNORMAL LOW (ref 3.87–5.11)
RBC: 3.57 MIL/uL — ABNORMAL LOW (ref 3.87–5.11)
RDW: 13.4 % (ref 11.5–15.5)
RDW: 13.7 % (ref 11.5–15.5)
WBC: 7.2 10*3/uL (ref 4.0–10.5)
WBC: 9.4 10*3/uL (ref 4.0–10.5)

## 2010-09-14 LAB — BASIC METABOLIC PANEL
CO2: 28 mEq/L (ref 19–32)
CO2: 32 mEq/L (ref 19–32)
Calcium: 8.5 mg/dL (ref 8.4–10.5)
Calcium: 8.7 mg/dL (ref 8.4–10.5)
Calcium: 9 mg/dL (ref 8.4–10.5)
Chloride: 98 mEq/L (ref 96–112)
GFR calc Af Amer: >60 mL/min (ref >60)
GFR calc Af Amer: >60 mL/min (ref >60)
GFR calc Af Amer: >60 mL/min (ref >60)
GFR calc non Af Amer: >60 mL/min (ref >60)
GFR calc non Af Amer: >60 mL/min (ref >60)
Glucose, Bld: 125 mg/dL — ABNORMAL HIGH (ref 70–99)
Potassium: 3.2 mEq/L — ABNORMAL LOW (ref 3.5–5.1)
Potassium: 3.6 mEq/L (ref 3.5–5.1)
Potassium: 4.2 mEq/L (ref 3.5–5.1)
Sodium: 135 mEq/L (ref 135–145)
Sodium: 137 mEq/L (ref 135–145)
Sodium: 138 mEq/L (ref 135–145)

## 2010-09-14 LAB — URINALYSIS, ROUTINE W REFLEX MICROSCOPIC
Bilirubin Urine: NEGATIVE
Hgb urine dipstick: NEGATIVE
Ketones, ur: NEGATIVE mg/dL
Protein, ur: NEGATIVE mg/dL
Urobilinogen, UA: 0.2 mg/dL (ref 0.0–1.0)

## 2010-09-23 ENCOUNTER — Ambulatory Visit
Admission: RE | Admit: 2010-09-23 | Discharge: 2010-09-23 | Disposition: A | Discharge status: Home / Self Care | Payer: BC Managed Care – PPO | Source: Ambulatory Visit | Attending: Obstetrics and Gynecology | Admitting: Obstetrics and Gynecology

## 2010-09-23 DIAGNOSIS — Z1231 Encounter for screening mammogram for malignant neoplasm of breast: Secondary | ICD-10-CM

## 2010-10-21 NOTE — Op Note (Signed)
[name], [name] NO.:  [id number]   MEDICAL RECORD NO.:  [id number]          PATIENT TYPE:  INP   LOCATION:  5036                         FACILITY:  [facility]   PHYSICIAN:  Vania Rea. Supple, M.D.  DATE OF BIRTH:  08-29-1945   DATE OF PROCEDURE:  12/27/2008  DATE OF DISCHARGE:                               OPERATIVE REPORT   PREOPERATIVE DIAGNOSIS:  End-stage right knee osteoarthrosis.   POSTOPERATIVE DIAGNOSIS:  End-stage right knee osteoarthrosis.   PROCEDURE:  Cemented right total knee arthroplasty utilizing a DePuy  Sigma implant, size 3 femur, size 4 tibia, 15-mm thick rotating platform  polyethylene insert, and a 35-mm patellar button.   SURGEON:  Vania Rea. Supple, MD   ASSISTANT:  Lucita Lora. Shuford, PA-C   ANESTHESIA:  General endotracheal as well as a femoral nerve block.   TOURNIQUET TIME:  61 minutes.   ESTIMATED BLOOD LOSS:  150 mL.   DRAINS:  Hemovac x1.   HISTORY:  [name] is a 65 year old female who has had chronic and  progressively increasing bilateral knee pain secondary to end-stage  arthrosis.  Due to her ongoing pain and functional limitations, she is  brought to the operating room at this time for planned right total knee  arthroplasty.   Preoperatively, I counseled [name] on treatment options as well as  risks versus benefits thereof.  Possible surgical complications of  bleeding, infection, neurovascular injury, DVT, PE as well as persistent  pain and possible need for revision surgery were all reviewed.  She  understands and accepts and agrees with our planned procedure.   PROCEDURE IN DETAIL:  After undergoing routine preop evaluation, the  patient received prophylactic antibiotics.  Femoral nerve block was  established in holding area by the Anesthesia Department.  Placed supine  on the operative table and underwent smooth induction of a general  endotracheal anesthesia.  A Foley catheter was placed.  Tourniquet was  applied in the right thigh.  Right leg was sterilely prepped and draped  in standard fashion.  Time-out was called.  Leg was exsanguinated with a  tourniquet inflated to 350 mmHg.  An anterior midline incision was then  made approximately 20 cm in length centered over the patella.  Skin  flaps were mobilized.  Medial parapatellar arthrotomy was performed.  The patella was everted.  Fat pad excised.  Knee was flexed up.  Drill  was then used to gain access to the femoral medullary canal and  intramedullary canal guide was then passed.  We made a 11-mm resection 5  degrees valgus alignment from the distal femur.  The distal femur was  then sized with the size 3 having the best fit.  The size 3 cutting  guide was then placed and the anterior-posterior chamfer cuts were then  made with care taken to protect the periarticular soft tissues.  Trial  implant showed excellent fit.  Attention was then turned to the proximal  tibia where we removed the remnants of the menisci and cruciate  ligaments.  A femoral extramedullary guide was then placed and a 10-mm  resection was then performed measured from the lateral plateau.  The  proximal tibia was then incised with a size 4 providing the best  coverage.  Trial size 4 was pinned into position and trial reduction was  performed.  Of note, we did perform a significant medial release because  she had severe varus deformity and this allowed excellent correction  with symmetric extension gap.  Trial reductions showed implants with  good position, good stability, and excellent knee motion.  We then  completed preparation of the proximal tibia using the reamer and broach.  We then returned to the distal femur where the box cutting guide was  then used to create a box cut with oscillating saw.  The box trial was  then placed again showing excellent fit and soft tissue balance.  Attention was then turned to the patella where an oscillating saw was  used to  resect approximately 8.5 mm of bone and stabilizing drill holes  were then placed for the 35-mm patellar button.  At this point, we then  used an osteotome to remove osteophytes from posterior aspect of the  femoral condyles and residual soft tissue debrided from posterior  compartment was removed.  Final inspection and irrigation was completed  and pulsatile lavage irrigation was performed with meticulous cleaning  of all cut surfaces.  Cement was mixed at the appropriate consistency.  All implants were then cemented into position.  Meticulous removal of  all extra cement was completed.  We then performed additional trial  reductions and the 15-mm poly implant showed the best soft tissue  balance.  The final 15-mm tray was then introduced and final reduction  performed.  Knee was taken through range of motion showed again full  extension and excellent stability.  A Hemovac drain was brought out  superolaterally.  The tourniquet was let down.  Hemostasis was obtained.  The parapatellar arthrotomy was closed with a series of interrupted  figure-of-eight #1 Vicryl sutures, 2-0 Vicryl was used for the subcu  layer, and intracuticular 3-0 Monocryl to the skin, followed by Steri-  Strips.  Bulky dry dressing was wrapped about the knee and leg was  wrapped with an Ace bandage, thigh support stocking, and ice-packs.  The  patient was then awakened, extubated, and taken to the recovery room in  stable condition.      Vania Rea. Supple, M.D.  Electronically Signed     KMS/MEDQ  D:  12/27/2008  T:  12/28/2008  Job:  [id number]

## 2010-10-24 NOTE — Op Note (Signed)
NAME:  [name], BRAFFORD                          ACCOUNT NO.:  [id number]   MEDICAL RECORD NO.:  [id number]                   PATIENT TYPE:  AMB   LOCATION:  ENDO                                 FACILITY:  [facility]   PHYSICIAN:  John C. Madilyn Fireman, M.D.                 DATE OF BIRTH:  1945/08/15   DATE OF PROCEDURE:  07/23/2003  DATE OF DISCHARGE:                                 OPERATIVE REPORT   PROCEDURE:  Esophagogastroduodenoscopy.   INDICATIONS FOR PROCEDURE:  Patient with ongoing chest pain of unclear  etiology, who was also undergoing screening colonoscopy today.   DESCRIPTION OF PROCEDURE:  The patient was placed in the left lateral  decubitus position and placed on the pulse monitor with continuous low flow  oxygen delivered by nasal cannula.  She was sedated with 75 mcg IV Fentanyl  and 6 mg IV Versed. The Olympus video endoscope was advanced under direct  vision into the oropharynx and esophagus. The esophagus was straight and of  normal caliber with the squamocolumnar line at 38 cm.  There was no visible  hiatal hernia, ring, stricture or other abnormalities at the EG-junction.  Stomach was entered and a small amount of liquid secretions were suctioned  from the fundus.  Retroflex view of the cardia was unremarkable, the fundus,  body, antrum and pylorus. The duodenum was entered and both the bulb and  second portion were well inspected and appeared to be within normal limits.  The scope was then withdrawn, and the patient prepared for colonoscopy. She  tolerated the procedure well.  There were no immediate complications.   IMPRESSION:  Normal colonoscopy.   PLAN:  Will proceed with colonoscopy.                                               John C. Madilyn Fireman, M.D.    JCH/MEDQ  D:  07/23/2003  T:  07/23/2003  Job:  [id number]   cc:   Windle Guard, M.D.  [address]  [place], [place] [address]  Fax: [phone]

## 2010-10-24 NOTE — Op Note (Signed)
[facility]  Patient:    YOLANI, VO Visit Number: [id number] MRN: [id number]          Service Type: SUR Location: 4W [id number] [id number] Attending Physician:  Meredith Leeds Dictated by:   Zigmund Daniel, M.D. Proc. Date: 11/11/01 Admit Date:  11/11/2001 Discharge Date: 11/12/2001                             Operative Report  PREOPERATIVE DIAGNOSIS:  Symptomatic gallstones.  POSTOPERATIVE DIAGNOSIS:  Symptomatic gallstones.  OPERATION PERFORMED:  Laparoscopic cholecystectomy.  SURGEON:  Zigmund Daniel, M.D.  ANESTHESIA:  General.  DESCRIPTION OF PROCEDURE:  After the patient was monitored and anesthetized and had routine preparation and draping of the abdomen, I liberally infused local anesthetic just below the umbilicus and made a short transverse incision. I dissected down to the fascia and opened it longitudinally and then carefully opened the peritoneum bluntly. There were no significant adhesions present. I put an #0 Vicryl suture as a pursestring in the fascia and secured the Hasson cannula and inflated the abdomen with CO2. I then put in the laparoscope and saw no abnormalities evident in the abdomen. I anesthetized three additional port sites and after putting them in under direct vision, positioned the patient head up foot down and tilted to the left. This produced visibility of the gallbladder. I grasped the fundus of the gallbladder and elevated it toward the right shoulder and took down the few adhesions which were present and then grasped the infundibulum of the gallbladder and pulled it to the right. I dissected down the gallbladder until the cystic duct emerged and once it was clear that the cystic duct was emerging from the infundibulum of the gallbladder, I clipped it with four clips and cut between the two that were closest to the gallbladder. I then dissected a little bit medial to that and identified the cystic artery  and similarly clipped and divided it. I then used the hook cautery and spatula cautery instruments to separate the gallbladder from the liver gaining hemostasis with the cautery. Near the fundus of the gallbladder, I inadvertently made a small hole in it and removed as much bile as I could. I then detached the gallbladder from the liver, checked for bleeding and for leakage of bile and checked to see that the clips were secure. I then placed the gallbladder in a plastic pouch. I copiously irrigated the right upper quadrant until the irrigant came out clear when it was removed. I then removed the gallbladder from the body through the umbilical incision and tied the pursestring suture. I took one more look in the right upper quadrant and saw no continued bleeding or other problem. I removed the lateral ports under direct vision and then allowed the CO2 to escape and removed the epigastric port. I closed all of the skin incisions with intracuticular 4-0 Vicryl and Steri-Strips and applied bandages. The patient tolerated the operation well. Dictated by:   Zigmund Daniel, M.D. Attending Physician:  Meredith Leeds DD:  11/11/01 TD:  11/14/01 Job: [id number]

## 2010-10-24 NOTE — Op Note (Signed)
NAME:  [name], [name]                          ACCOUNT NO.:  [id number]   MEDICAL RECORD NO.:  [id number]                   PATIENT TYPE:  AMB   LOCATION:  ENDO                                 FACILITY:  [facility]   PHYSICIAN:  John C. Madilyn Fireman, M.D.                 DATE OF BIRTH:  December 11, 1945   DATE OF PROCEDURE:  07/23/2003  DATE OF DISCHARGE:                                 OPERATIVE REPORT   PROCEDURE:  Colonoscopy.   INDICATIONS FOR PROCEDURE:  Occasional rectal bleeding in a 65 year old  patient with no prior colon screening.   DESCRIPTION OF PROCEDURE:  The patient was placed in the left lateral  decubitus position and placed on the pulse monitor with continuous low flow  oxygen delivered by nasal cannula. She was sedated with 2 mg IV Fentanyl and  25 mcg IV Fentanyl, in addition to the medicine given for the previous EGD.  The Olympus video colonoscope was inserted into the rectum and advanced to  the cecum, confirmed by transillumination of McBurney's point and  visualization of the ileocecal valve and appendiceal orifice. The prep was  excellent. The cecum, ascending, transverse and descending colon all  appeared normal with no masses, polyps, diverticula or other mucosal  abnormalities.  In the sigmoid colon, there were some increased scattered  diverticula, no other abnormalities.  The rectum appeared normal, on  retroflex view of the anus, this revealed no obvious internal hemorrhoids.  Scope was then withdrawn, the patient returned to the recovery room in  stable condition. She tolerated the procedure well and there were no  immediate complications.   IMPRESSION:  Diverticulosis, otherwise normal study.   PLAN:  Next colon screening by sigmoidoscopy in 5 years.                                               John C. Madilyn Fireman, M.D.    JCH/MEDQ  D:  07/23/2003  T:  07/23/2003  Job:  [id number]   cc:   Windle Guard, M.D.  [address]  [place], [place] [address]  Fax:  [phone]

## 2010-10-24 NOTE — Discharge Summary (Signed)
[name], [name] NO.:  [id number]   MEDICAL RECORD NO.:  [id number]          PATIENT TYPE:  INP   LOCATION:  5036                         FACILITY:  [facility]   PHYSICIAN:  Vania Rea. Supple, M.D.  DATE OF BIRTH:  November 29, 1945   DATE OF ADMISSION:  12/27/2008  DATE OF DISCHARGE:  12/31/2008                               DISCHARGE SUMMARY   ADMISSION DIAGNOSES:  1. Right knee end-stage osteoarthrosis.  2. Hypertension.  3. History of atrial fibrillation, paroxysmal.  4. History of peripheral edema.   DISCHARGE DIAGNOSES:  1. Right knee end-stage osteoarthrosis.  2. Hypertension.  3. History of atrial fibrillation, paroxysmal.  4. History of peripheral edema.  5. Status post right total knee arthroplasty.   OPERATIONS:  Right total knee arthroplasty.   SURGEON:  Vania Rea. Supple, MD   ASSISTANT:  Lucita Lora. Shuford, PA-C   ANESTHESIA:  Under general anesthetic with a femoral nerve block.   CONSULTANT:  None.   BRIEF HISTORY:  [name] is a pleasant 65 year old female who has been  followed by [name] outpatient for ongoing management of her bilateral knee  osteoarthrosis.  Her right is currently more symptomatic than her left.  She has failed outpatient conservative measures and x-rays have  demonstrated bone-on-bone deformity.  At this time, we have discussed  total knee arthroplasty as well as risks and benefits at length and she  wished to proceed.   HOSPITAL COURSE:  The patient was admitted and underwent the above-named  procedure and tolerated this well.  All appropriate IV antibiotics and  analgesics were provided.  Postoperatively, she was placed on Coumadin  protocol for DVT and PE prophylaxis as well as placed on physical  therapy.  Weightbearing as tolerated with CPM usage and total knee  replacement protocol.  Postoperatively, she remained hemodynamically  stable.  She did have some mild elevated temperatures on postop day #3.  She had slow  progress initially with physical therapy, however, by  postoperative day 4, her fever had resolved and her mobilization had  improved significantly such that on December 31, 2008, she was afebrile, her  incision was clean and dry, and she had met all of her therapy goals.  At this time, she was felt to be stable for discharge to home.  Postoperatively, her hemoglobin remained stable at 9.4 on December 29, 2008.  Home health arrangements and occupational therapy were all arranged.  At  this time, she was felt medically and orthopedically stable for  discharge to home.   LABORATORY DATA:  Admission hemoglobin of 14.2, postoperatively 11.1,  9.8, and 10.3.  Chemistries within normal limits except for mild  hyperkalemia on admission at 3.2.  This normalized postoperatively.  Urinalysis was negative.  EKG shows normal sinus rhythm.   CONDITION ON DISCHARGE:  Stable, improved.   DISCHARGE MEDICATIONS AND PLANS:  The patient is being discharged to  home with Home Health PT, OT, and RN.  She should follow up with Dr.  Rennis Chris in 2 weeks.  Call [phone] to arrange for transportation.  She  may shower on day  5.  Dry dressing changes daily.  Prescriptions for  Percocet, Coumadin, and Robaxin are provided.  She may continue with the  over-the-counter Zyrtec for itching and stool softeners.  Resume her  other home medications and home diet.  Call the office for any other  problems.      Tracy A. Shuford, P.A.-C.      Vania Rea. Supple, M.D.  Electronically Signed    TAS/MEDQ  D:  01/10/2009  T:  01/11/2009  Job:  [id number]

## 2010-11-17 ENCOUNTER — Encounter: Payer: Self-pay | Admitting: Cardiovascular Disease

## 2011-01-12 ENCOUNTER — Other Ambulatory Visit: Payer: Self-pay | Admitting: *Deleted

## 2011-01-12 MED ORDER — DILTIAZEM HCL ER 240 MG PO CP24: 240.0000 mg | ORAL_CAPSULE | Freq: Every day | ORAL | Status: DC

## 2011-01-12 NOTE — Telephone Encounter (Signed)
Fax received from pharmacy. Refill completed. Jodette Nevaya Nagele RN  

## 2011-02-02 ENCOUNTER — Other Ambulatory Visit: Payer: Self-pay | Admitting: Cardiovascular Disease

## 2011-02-02 ENCOUNTER — Encounter: Payer: Self-pay | Admitting: *Deleted

## 2011-02-02 DIAGNOSIS — E669 Obesity, unspecified: Secondary | ICD-10-CM

## 2011-02-02 DIAGNOSIS — I480 Paroxysmal atrial fibrillation: Secondary | ICD-10-CM | POA: Insufficient documentation

## 2011-02-02 DIAGNOSIS — M7989 Other specified soft tissue disorders: Secondary | ICD-10-CM | POA: Insufficient documentation

## 2011-02-02 DIAGNOSIS — I4891 Unspecified atrial fibrillation: Secondary | ICD-10-CM

## 2011-02-02 DIAGNOSIS — I10 Essential (primary) hypertension: Secondary | ICD-10-CM

## 2011-02-02 HISTORY — DX: Other specified soft tissue disorders: M79.89

## 2011-02-02 HISTORY — DX: Paroxysmal atrial fibrillation: I48.0

## 2011-02-04 ENCOUNTER — Ambulatory Visit (INDEPENDENT_AMBULATORY_CARE_PROVIDER_SITE_OTHER): Payer: BC Managed Care – PPO | Admitting: Cardiovascular Disease

## 2011-02-04 ENCOUNTER — Other Ambulatory Visit (INDEPENDENT_AMBULATORY_CARE_PROVIDER_SITE_OTHER): Payer: BC Managed Care – PPO | Admitting: *Deleted

## 2011-02-04 ENCOUNTER — Encounter: Payer: Self-pay | Admitting: Cardiovascular Disease

## 2011-02-04 DIAGNOSIS — I10 Essential (primary) hypertension: Secondary | ICD-10-CM

## 2011-02-04 DIAGNOSIS — R739 Hyperglycemia, unspecified: Secondary | ICD-10-CM | POA: Insufficient documentation

## 2011-02-04 DIAGNOSIS — I4891 Unspecified atrial fibrillation: Secondary | ICD-10-CM

## 2011-02-04 DIAGNOSIS — E785 Hyperlipidemia, unspecified: Secondary | ICD-10-CM | POA: Insufficient documentation

## 2011-02-04 DIAGNOSIS — R609 Edema, unspecified: Secondary | ICD-10-CM

## 2011-02-04 DIAGNOSIS — R7309 Other abnormal glucose: Secondary | ICD-10-CM

## 2011-02-04 DIAGNOSIS — R600 Localized edema: Secondary | ICD-10-CM

## 2011-02-04 DIAGNOSIS — E669 Obesity, unspecified: Secondary | ICD-10-CM

## 2011-02-04 HISTORY — DX: Hyperlipidemia, unspecified: E78.5

## 2011-02-04 HISTORY — DX: Localized edema: R60.0

## 2011-02-04 HISTORY — DX: Hyperglycemia, unspecified: R73.9

## 2011-02-04 LAB — BASIC METABOLIC PANEL WITH GFR
BUN: 24 mg/dL — ABNORMAL HIGH (ref 6–23)
CO2: 31 meq/L (ref 19–32)
Calcium: 9.8 mg/dL (ref 8.4–10.5)
Chloride: 103 meq/L (ref 96–112)
Creatinine, Ser: 0.9 mg/dL (ref 0.4–1.2)
GFR: 70.51 mL/min
Glucose, Bld: 106 mg/dL — ABNORMAL HIGH (ref 70–99)
Potassium: 4.5 meq/L (ref 3.5–5.1)
Sodium: 143 meq/L (ref 135–145)

## 2011-02-04 LAB — HEPATIC FUNCTION PANEL
ALT: 23 U/L (ref 0–35)
AST: 23 U/L (ref 0–37)
Albumin: 4.1 g/dL (ref 3.5–5.2)
Alkaline Phosphatase: 85 U/L (ref 39–117)
Bilirubin, Direct: 0.1 mg/dL (ref 0.0–0.3)
Total Bilirubin: 0.5 mg/dL (ref 0.3–1.2)
Total Protein: 6.5 g/dL (ref 6.0–8.3)

## 2011-02-04 LAB — LIPID PANEL
Cholesterol: 184 mg/dL (ref 0–200)
HDL: 56.1 mg/dL
LDL Cholesterol: 113 mg/dL — ABNORMAL HIGH (ref 0–99)
Total CHOL/HDL Ratio: 3
Triglycerides: 77 mg/dL (ref 0.0–149.0)
VLDL: 15.4 mg/dL (ref 0.0–40.0)

## 2011-02-04 NOTE — Assessment & Plan Note (Addendum)
She's been noted to have some episodes of hyperglycemia at her medical doctor's office. Per his request we have ordered a hemoglobin A1c.

## 2011-02-04 NOTE — Progress Notes (Signed)
[name] Date of Birth  Sep 14, 1945 [facility] / [facility] [address]     [address] [place], [place]  [address] [phone]  Fax  [phone]  History of Present Illness:    Current Outpatient Prescriptions on File Prior to Visit  Medication Sig Dispense Refill  . aspirin 325 MG tablet Take 325 mg by mouth daily.        . Calcium Carbonate-Vit D-Min (GNP CALCIUM PLUS 600 +D PO) Take 1 tablet by mouth 2 (two) times daily.        Marland Kitchen diltiazem (DILACOR XR) 240 MG 24 hr capsule Take 1 capsule (240 mg total) by mouth daily.  90 capsule  1  . ergocalciferol (VITAMIN D2) 50000 UNITS capsule Take 50,000 Units by mouth once a week.        Marland Kitchen ibuprofen (ADVIL,MOTRIN) 200 MG tablet Take 600 mg by mouth as needed.        . multivitamin (THERAGRAN) per tablet Take 1 tablet by mouth daily.        . niacin 500 MG tablet Take 500 mg by mouth at bedtime.        . Olopatadine HCl (PATADAY OP) Apply 1 drop to eye daily.        . Omega-3 Fatty Acids (FISH OIL PO) Take 1 capsule by mouth daily.        . propranolol (INDERAL) 10 MG tablet Take 10 mg by mouth as needed.        . triamterene-hydrochlorothiazide (DYAZIDE) 50-25 MG per capsule Take 1 capsule by mouth daily.        Marland Kitchen omeprazole (PRILOSEC) 20 MG capsule Take 20 mg by mouth as needed.          Allergies  Allergen Reactions  . Erythromycin     REACTION: Reaction not known  . Plavix (Clopidogrel Bisulfate)   . Sulfonamide Derivatives     REACTION: Reaction not known    Past Medical History  Diagnosis Date  . Hypertension   . Atrial fibrillation   . Obesity   . Chest pain   . Arthritis     Past Surgical History  Procedure Date  . Cardiac catheterization 07/11/2010    Est. EF of 60-65% -- Smooth and normal coronary arteries  -- Normal LV systolic function   . Total knee arthroplasty 06/20/2009    left knee  . Total vaginal hysterectomy 07/22/2010    with bilateral salpingo-oophorectomy by Dr.  Tresa Res  . Cystocele repair 07/22/2010    Vault suspension, cystocele repair, graft and cystoscopy  . Total knee arthroplasty 12/27/2008    right knee    History  Smoking status  . Never Smoker   Smokeless tobacco  . Never Used    History  Alcohol Use No    Family History  Problem Relation Age of Onset  . Emphysema Father   . Atrial fibrillation Mother     has pacemaker  . Hypertension Brother     Reviw of Systems:  Reviewed in the HPI.  All other systems are negative.  Physical Exam: Pulse 58  Ht 5\' 8"  (1.727 m)  Wt 283 lb 9.6 oz (128.64 kg)  BMI 43.12 kg/m2 The patient is alert and oriented x 3.  The mood and affect are normal.   Skin: warm and dry.  Color is normal.    HEENT:   the sclera are nonicteric.  The mucous membranes are moist.  The carotids are 2+ without  bruits.  There is no thyromegaly.  There is no JVD.    Lungs: clear.  The chest wall is non tender.    Heart: regular rate with a normal S1 and S2.  There are no murmurs, gallops, or rubs. The PMI is not displaced.     Abdomen: good bowel sounds.  There is no guarding or rebound.  There is no hepatosplenomegaly or tenderness.  There are no masses.   Ext:   Trace edema  The legs are without rashes.  The distal pulses are intact.   Neuro:  Cranial nerves II - XII are intact.  Motor and sensory functions are intact.    The gait is normal.  ECG:  Assessment / Plan:

## 2011-02-04 NOTE — Assessment & Plan Note (Signed)
I think most of her leg edema is due to venous insufficiency because of her weight. I've encouraged her to exercise on a regular basis. She'll continue with her diuretics. We checked a basic metabolic profile today.

## 2011-02-04 NOTE — Assessment & Plan Note (Signed)
She remains in normal sinus rhythm. 

## 2011-02-04 NOTE — Assessment & Plan Note (Signed)
She has been doing fairly well. She's gained a little bit of weight since her surgery this past winter. She's been slowly losing weight. We are unable to measure her blood pressure today because of lack of a large cuff. I have encouraged her to continue to watch her salt intake and exercise.

## 2011-02-04 NOTE — Assessment & Plan Note (Signed)
We will be checking a lipid profile today.

## 2011-02-05 ENCOUNTER — Encounter: Payer: Self-pay | Admitting: Cardiovascular Disease

## 2011-07-16 ENCOUNTER — Telehealth: Payer: Self-pay | Admitting: Cardiovascular Disease

## 2011-07-16 MED ORDER — DILTIAZEM HCL ER 240 MG PO CP24: 240.0000 mg | ORAL_CAPSULE | Freq: Every day | ORAL | Status: DC

## 2011-07-16 NOTE — Telephone Encounter (Signed)
PT NEEDS REFILL OF DILITIAZEM [facility] FAXED Monday AND NOW OUT NEEDS ASAP

## 2011-08-20 ENCOUNTER — Other Ambulatory Visit: Payer: Self-pay | Admitting: Obstetrics and Gynecology

## 2011-08-20 DIAGNOSIS — Z1231 Encounter for screening mammogram for malignant neoplasm of breast: Secondary | ICD-10-CM

## 2011-09-24 ENCOUNTER — Ambulatory Visit
Admission: RE | Admit: 2011-09-24 | Discharge: 2011-09-24 | Disposition: A | Discharge status: Home / Self Care | Payer: Medicare Other | Source: Ambulatory Visit | Attending: Obstetrics and Gynecology | Admitting: Obstetrics and Gynecology

## 2011-09-24 DIAGNOSIS — Z1231 Encounter for screening mammogram for malignant neoplasm of breast: Secondary | ICD-10-CM

## 2012-01-29 ENCOUNTER — Other Ambulatory Visit: Payer: Self-pay | Admitting: Obstetrics and Gynecology

## 2012-01-29 DIAGNOSIS — M8580 Other specified disorders of bone density and structure, unspecified site: Secondary | ICD-10-CM

## 2012-02-03 ENCOUNTER — Ambulatory Visit
Admission: RE | Admit: 2012-02-03 | Discharge: 2012-02-03 | Disposition: A | Discharge status: Home / Self Care | Payer: Medicare Other | Source: Ambulatory Visit | Attending: Obstetrics and Gynecology | Admitting: Obstetrics and Gynecology

## 2012-02-03 DIAGNOSIS — M8580 Other specified disorders of bone density and structure, unspecified site: Secondary | ICD-10-CM

## 2012-05-23 ENCOUNTER — Encounter: Payer: Self-pay | Admitting: Cardiovascular Disease

## 2012-05-23 ENCOUNTER — Ambulatory Visit: Payer: Medicare Other | Attending: Orthopedic Surgery | Admitting: Physical Therapy

## 2012-05-23 DIAGNOSIS — M6281 Muscle weakness (generalized): Secondary | ICD-10-CM | POA: Insufficient documentation

## 2012-05-23 DIAGNOSIS — IMO0001 Reserved for inherently not codable concepts without codable children: Secondary | ICD-10-CM | POA: Insufficient documentation

## 2012-05-23 DIAGNOSIS — M25569 Pain in unspecified knee: Secondary | ICD-10-CM | POA: Insufficient documentation

## 2012-05-23 DIAGNOSIS — M25669 Stiffness of unspecified knee, not elsewhere classified: Secondary | ICD-10-CM | POA: Insufficient documentation

## 2012-05-23 DIAGNOSIS — R293 Abnormal posture: Secondary | ICD-10-CM | POA: Insufficient documentation

## 2012-05-25 ENCOUNTER — Ambulatory Visit: Payer: Medicare Other | Admitting: Physical Therapy

## 2012-05-30 ENCOUNTER — Ambulatory Visit: Payer: Medicare Other | Admitting: Physical Therapy

## 2012-06-03 ENCOUNTER — Ambulatory Visit: Payer: Medicare Other | Admitting: Rehabilitation

## 2012-06-06 ENCOUNTER — Ambulatory Visit: Payer: Medicare Other | Admitting: Physical Therapy

## 2012-06-09 ENCOUNTER — Ambulatory Visit: Payer: Medicare Other | Attending: Orthopedic Surgery | Admitting: Physical Therapy

## 2012-06-09 DIAGNOSIS — M25569 Pain in unspecified knee: Secondary | ICD-10-CM | POA: Insufficient documentation

## 2012-06-09 DIAGNOSIS — R293 Abnormal posture: Secondary | ICD-10-CM | POA: Insufficient documentation

## 2012-06-09 DIAGNOSIS — M6281 Muscle weakness (generalized): Secondary | ICD-10-CM | POA: Insufficient documentation

## 2012-06-09 DIAGNOSIS — IMO0001 Reserved for inherently not codable concepts without codable children: Secondary | ICD-10-CM | POA: Insufficient documentation

## 2012-06-09 DIAGNOSIS — M25669 Stiffness of unspecified knee, not elsewhere classified: Secondary | ICD-10-CM | POA: Insufficient documentation

## 2012-06-13 ENCOUNTER — Encounter: Payer: Medicare Other | Admitting: Physical Therapy

## 2012-06-16 ENCOUNTER — Ambulatory Visit: Payer: Medicare Other | Admitting: Physical Therapy

## 2012-06-23 ENCOUNTER — Encounter: Payer: Medicare Other | Admitting: Physical Therapy

## 2012-06-29 ENCOUNTER — Encounter: Payer: Self-pay | Admitting: Cardiovascular Disease

## 2012-06-29 ENCOUNTER — Ambulatory Visit (INDEPENDENT_AMBULATORY_CARE_PROVIDER_SITE_OTHER): Payer: Medicare Other | Admitting: Cardiovascular Disease

## 2012-06-29 VITALS — BP 148/96 | HR 66 | Ht 68.0 in | Wt 295.1 lb

## 2012-06-29 DIAGNOSIS — I4891 Unspecified atrial fibrillation: Secondary | ICD-10-CM

## 2012-06-29 DIAGNOSIS — I10 Essential (primary) hypertension: Secondary | ICD-10-CM

## 2012-06-29 DIAGNOSIS — E785 Hyperlipidemia, unspecified: Secondary | ICD-10-CM

## 2012-06-29 MED ORDER — PROPRANOLOL HCL 10 MG PO TABS: 10.0000 mg | ORAL_TABLET | ORAL | Status: DC | PRN

## 2012-06-29 NOTE — Assessment & Plan Note (Signed)
[name] feels well. She has not been as careful with her diet and has gained some weight. In addition, her blood pressure is elevated. She admits to eating extra salt in the past several days.  I've asked her to cut  back on her salt intake. I'll see her again in 6 months.

## 2012-06-29 NOTE — Patient Instructions (Addendum)
Use Niacin that has nicotinic acid   Nicotinamide does not cause fluhsing because it is not active and is not effective.  Your physician wants you to follow-up in: 6 months with ekg  You will receive a reminder letter in the mail two months in advance. If you don't receive a letter, please call our office to schedule the follow-up appointment.  Your physician recommends that you return for a FASTING lipid profile: 6 months so come fasting to appointment.  REDUCE HIGH SODIUM FOODS LIKE CANNED SOUP, GRAVY, SAUCES, READY PREPARED FOODS LIKE FROZEN FOODS; LEAN CUISINE, LASAGNA. BACON, SAUSAGE, LUNCH MEAT, FAST FOODS.Marland Kitchen   DASH Diet The DASH diet stands for "Dietary Approaches to Stop Hypertension." It is a healthy eating plan that has been shown to reduce high blood pressure (hypertension) in as little as 14 days, while also possibly providing other significant health benefits. These other health benefits include reducing the risk of breast cancer after menopause and reducing the risk of type 2 diabetes, heart disease, colon cancer, and stroke. Health benefits also include weight loss and slowing kidney failure in patients with chronic kidney disease.  DIET GUIDELINES  Limit salt (sodium). Your diet should contain less than 1500 mg of sodium daily.  Limit refined or processed carbohydrates. Your diet should include mostly whole grains. Desserts and added sugars should be used sparingly.  Include small amounts of heart-healthy fats. These types of fats include nuts, oils, and tub margarine. Limit saturated and trans fats. These fats have been shown to be harmful in the body. CHOOSING FOODS  The following food groups are based on a 2000 calorie diet. See your Registered Dietitian for individual calorie needs. Grains and Grain Products (6 to 8 servings daily)  Eat More Often: Whole-wheat bread, brown rice, whole-grain or wheat pasta, quinoa, popcorn without added fat or salt (air popped).  Eat Less  Often: White bread, white pasta, white rice, cornbread. Vegetables (4 to 5 servings daily)  Eat More Often: Fresh, frozen, and canned vegetables. Vegetables may be raw, steamed, roasted, or grilled with a minimal amount of fat.  Eat Less Often/Avoid: Creamed or fried vegetables. Vegetables in a cheese sauce. Fruit (4 to 5 servings daily)  Eat More Often: All fresh, canned (in natural juice), or frozen fruits. Dried fruits without added sugar. One hundred percent fruit juice ( cup [237 mL] daily).  Eat Less Often: Dried fruits with added sugar. Canned fruit in light or heavy syrup. [employer], Fish, and Poultry (2 servings or less daily. One serving is 3 to 4 oz [85-114 g]).  Eat More Often: Ninety percent or leaner ground beef, tenderloin, sirloin. Round cuts of beef, chicken breast, [place] breast. All fish. Grill, bake, or broil your meat. Nothing should be fried.  Eat Less Often/Avoid: Fatty cuts of meat, [place], or chicken leg, thigh, or wing. Fried cuts of meat or fish. Dairy (2 to 3 servings)  Eat More Often: Low-fat or fat-free milk, low-fat plain or light yogurt, reduced-fat or part-skim cheese.  Eat Less Often/Avoid: Milk (whole, 2%).Whole milk yogurt. Full-fat cheeses. Nuts, Seeds, and Legumes (4 to 5 servings per week)  Eat More Often: All without added salt.  Eat Less Often/Avoid: Salted nuts and seeds, canned beans with added salt. Fats and Sweets (limited)  Eat More Often: Vegetable oils, tub margarines without trans fats, sugar-free gelatin. Mayonnaise and salad dressings.  Eat Less Often/Avoid: Coconut oils, palm oils, butter, stick margarine, cream, half and half, cookies, candy, pie. FOR MORE INFORMATION  The Dash Diet Eating Plan: www.dashdiet.org Document Released: 05/14/2011 Document Revised: 08/17/2011 Document Reviewed: 05/14/2011 [facility] Patient Information 2013 [place], [place].

## 2012-06-29 NOTE — Assessment & Plan Note (Signed)
She has a history of atrial he remains in normal sinus rhythm as of now.

## 2012-06-29 NOTE — Progress Notes (Signed)
[name] Date of Birth  [birth year] [facility] [address]     [address] [place], [place]  [address] [phone]  Fax  [phone]  Problem list: 1. Hypertension 2. History of atrial fibrillation 3. Hyperlipidemia   History of Present Illness:  [name] is doing well.  She has decreased her dose of Maxzide and her BP has been elevated.  She has not been exercising on a regular basis but has been trying to exercise more since New's Years Day.  She has lost 6 pounds.  She has not had any chest pain or dyspnea.   She did feel a little bit of extra salt recently.   Current Outpatient Prescriptions on File Prior to Visit  Medication Sig Dispense Refill  . Calcium Carbonate-Vit D-Min (GNP CALCIUM PLUS 600 +D PO) Take 1 tablet by mouth 2 (two) times daily.        Marland Kitchen diltiazem (DILACOR XR) 240 MG 24 hr capsule Take 1 capsule (240 mg total) by mouth daily.  90 capsule  3  . ibuprofen (ADVIL,MOTRIN) 200 MG tablet Take 600 mg by mouth as needed.        . multivitamin (THERAGRAN) per tablet Take 1 tablet by mouth daily.        . niacin 500 MG tablet Take 500 mg by mouth at bedtime.        . Omega-3 Fatty Acids (FISH OIL PO) Take 1 capsule by mouth daily.        . propranolol (INDERAL) 10 MG tablet Take 1 tablet (10 mg total) by mouth as needed.  30 tablet  5    Allergies  Allergen Reactions  . Erythromycin     Nausea  . Plavix (Clopidogrel Bisulfate)   . Sulfonamide Derivatives     Rash    Past Medical History  Diagnosis Date  . Hypertension   . Atrial fibrillation   . Obesity   . Chest pain   . Arthritis     Past Surgical History  Procedure Date  . Cardiac catheterization 07/11/2010    Est. EF of 60-65% -- Smooth and normal coronary arteries  -- Normal LV systolic function   . Total knee arthroplasty 06/20/2009    left knee  . Total vaginal hysterectomy 07/22/2010    with bilateral salpingo-oophorectomy by Dr. Tresa Res  .  Cystocele repair 07/22/2010    Vault suspension, cystocele repair, graft and cystoscopy  . Total knee arthroplasty 12/27/2008    right knee    History  Smoking status  . Never Smoker   Smokeless tobacco  . Never Used    History  Alcohol Use No    Family History  Problem Relation Age of Onset  . Emphysema Father   . Atrial fibrillation Mother     has pacemaker  . Hypertension Brother     Reviw of Systems:  Reviewed in the HPI.  All other systems are negative.  Physical Exam: BP 148/96  Pulse 66  Ht 5\' 8"  (1.727 m)  Wt 295 lb 1.9 oz (133.866 kg)  BMI 44.87 kg/m2 The patient is alert and oriented x 3.  The mood and affect are normal.   Skin: warm and dry.  Color is normal.    HEENT:   the sclera are nonicteric.  The mucous membranes are moist.  The carotids are 2+ without bruits.  There is no thyromegaly.  There is no JVD.    Lungs: clear.  The chest  wall is non tender.    Heart: regular rate with a normal S1 and S2.  There are no murmurs, gallops, or rubs. The PMI is not displaced.     Abdomen: good bowel sounds.  There is no guarding or rebound.  There is no hepatosplenomegaly or tenderness.  There are no masses.   Ext:   Trace edema  The legs are without rashes.  The distal pulses are intact.   Neuro:  Cranial nerves II - XII are intact.  Motor and sensory functions are intact.    The gait is normal.  ECG: June 29, 2012: NSR at 66 bpm. No ST or T wave changes.  Assessment / Plan:

## 2012-07-07 ENCOUNTER — Other Ambulatory Visit: Payer: Self-pay | Admitting: *Deleted

## 2012-07-07 MED ORDER — DILTIAZEM HCL ER 240 MG PO CP24: 240.0000 mg | ORAL_CAPSULE | Freq: Every day | ORAL | Status: DC

## 2012-07-07 NOTE — Telephone Encounter (Signed)
Fax Received. Refill Completed. [name] (R.M.A)   

## 2012-07-25 ENCOUNTER — Other Ambulatory Visit (HOSPITAL_COMMUNITY): Payer: Self-pay | Admitting: Family Medicine

## 2012-07-25 DIAGNOSIS — R110 Nausea: Secondary | ICD-10-CM

## 2012-08-02 ENCOUNTER — Encounter (HOSPITAL_COMMUNITY)
Admission: RE | Admit: 2012-08-02 | Discharge: 2012-08-02 | Disposition: A | Discharge status: Home / Self Care | Payer: Medicare Other | Source: Ambulatory Visit | Attending: Family Medicine | Admitting: Family Medicine

## 2012-08-02 DIAGNOSIS — R110 Nausea: Secondary | ICD-10-CM | POA: Insufficient documentation

## 2012-08-02 DIAGNOSIS — Z9089 Acquired absence of other organs: Secondary | ICD-10-CM | POA: Insufficient documentation

## 2012-08-02 MED ORDER — TECHNETIUM TC 99M MEBROFENIN IV KIT
5.0000 | PACK | Freq: Once | INTRAVENOUS | Status: AC | PRN
  Administered 2012-08-02: 5 via INTRAVENOUS

## 2012-09-29 ENCOUNTER — Other Ambulatory Visit: Payer: Self-pay

## 2012-09-29 DIAGNOSIS — Z1231 Encounter for screening mammogram for malignant neoplasm of breast: Secondary | ICD-10-CM

## 2012-10-12 ENCOUNTER — Ambulatory Visit
Admission: RE | Admit: 2012-10-12 | Discharge: 2012-10-12 | Disposition: A | Discharge status: Home / Self Care | Payer: Medicare Other | Source: Ambulatory Visit

## 2012-10-12 DIAGNOSIS — Z1231 Encounter for screening mammogram for malignant neoplasm of breast: Secondary | ICD-10-CM

## 2012-12-28 ENCOUNTER — Other Ambulatory Visit: Payer: Medicare Other

## 2012-12-29 ENCOUNTER — Ambulatory Visit (INDEPENDENT_AMBULATORY_CARE_PROVIDER_SITE_OTHER): Payer: Medicare Other | Admitting: *Deleted

## 2012-12-29 DIAGNOSIS — I4891 Unspecified atrial fibrillation: Secondary | ICD-10-CM

## 2012-12-29 DIAGNOSIS — I10 Essential (primary) hypertension: Secondary | ICD-10-CM

## 2012-12-29 DIAGNOSIS — E785 Hyperlipidemia, unspecified: Secondary | ICD-10-CM

## 2012-12-29 DIAGNOSIS — R739 Hyperglycemia, unspecified: Secondary | ICD-10-CM

## 2012-12-29 DIAGNOSIS — R7309 Other abnormal glucose: Secondary | ICD-10-CM

## 2012-12-29 LAB — LIPID PANEL
Cholesterol: 180 mg/dL (ref 0–200)
HDL: 44.7 mg/dL (ref >39.00)
VLDL: 18.4 mg/dL (ref 0.0–40.0)

## 2012-12-29 LAB — BASIC METABOLIC PANEL
Calcium: 9.3 mg/dL (ref 8.4–10.5)
GFR: 69.16 mL/min (ref >60.00)
Glucose, Bld: 96 mg/dL (ref 70–99)
Potassium: 3.5 mEq/L (ref 3.5–5.1)
Sodium: 141 mEq/L (ref 135–145)

## 2012-12-29 LAB — HEPATIC FUNCTION PANEL
ALT: 24 U/L (ref 0–35)
AST: 22 U/L (ref 0–37)
Total Bilirubin: 0.8 mg/dL (ref 0.3–1.2)
Total Protein: 6.3 g/dL (ref 6.0–8.3)

## 2012-12-29 LAB — HEMOGLOBIN A1C: Hgb A1c MFr Bld: 5.8 % (ref 4.6–6.5)

## 2013-01-02 ENCOUNTER — Encounter: Payer: Self-pay | Admitting: Cardiovascular Disease

## 2013-01-02 ENCOUNTER — Ambulatory Visit (INDEPENDENT_AMBULATORY_CARE_PROVIDER_SITE_OTHER): Payer: Medicare Other | Admitting: Cardiovascular Disease

## 2013-01-02 VITALS — BP 118/76 | HR 66 | Ht 68.5 in | Wt 289.4 lb

## 2013-01-02 DIAGNOSIS — I4891 Unspecified atrial fibrillation: Secondary | ICD-10-CM

## 2013-01-02 DIAGNOSIS — E785 Hyperlipidemia, unspecified: Secondary | ICD-10-CM

## 2013-01-02 DIAGNOSIS — I10 Essential (primary) hypertension: Secondary | ICD-10-CM

## 2013-01-02 NOTE — Progress Notes (Signed)
[name] Date of Birth  Jan 17, 1946 [facility] / [facility] [address]     [address] [place], [place]  [address] [phone]  Fax  [phone]  Problem list: 1. Hypertension 2. History of atrial fibrillation 3. Hyperlipidemia   History of Present Illness:  [name] is doing well.  She has decreased her dose of Maxzide and her BP has been elevated.  She has not been exercising on a regular basis but has been trying to exercise more since New's Years Day.  She has lost 6 pounds.  She has not had any chest pain or dyspnea.   She did feel a little bit of extra salt recently.   January 02, 2013:  [name] has done well.  She had some various aches and pains (chest , back)   in Feb.  The pains improved with omeprazol but it caused excessive gas and bloating.  Her lipids have been minimally elevated.    Current Outpatient Prescriptions on File Prior to Visit  Medication Sig Dispense Refill  . Calcium Carbonate-Vit D-Min (GNP CALCIUM PLUS 600 +D PO) Take 1 tablet by mouth 2 (two) times daily.        . Cholecalciferol (VITAMIN D-3) 1000 UNITS CAPS Take 2,000 Units by mouth daily.      Marland Kitchen diltiazem (DILACOR XR) 240 MG 24 hr capsule Take 1 capsule (240 mg total) by mouth daily.  90 capsule  3  . ibuprofen (ADVIL,MOTRIN) 200 MG tablet Take 600 mg by mouth as needed.        Marland Kitchen KETOTIFEN FUMARATE OP Apply to eye 2 (two) times daily.      . multivitamin (THERAGRAN) per tablet Take 1 tablet by mouth daily.        . niacin 500 MG tablet Take 500 mg by mouth daily. Take 2 tabs at bedtime      . NON FORMULARY Non Flushing Niacin (inositel)      . Omega-3 Fatty Acids (FISH OIL PO) Take 1 capsule by mouth daily. 1200 mg daily      . propranolol (INDERAL) 10 MG tablet Take 1 tablet (10 mg total) by mouth as needed.  30 tablet  5  . triamterene-hydrochlorothiazide (MAXZIDE) 75-50 MG per tablet Take 1 tablet by mouth daily.       No current facility-administered medications  on file prior to visit.    Allergies  Allergen Reactions  . Erythromycin     Nausea  . Plavix (Clopidogrel Bisulfate)   . Sulfonamide Derivatives     Rash    Past Medical History  Diagnosis Date  . Hypertension   . Atrial fibrillation   . Obesity   . Chest pain   . Arthritis     Past Surgical History  Procedure Laterality Date  . Cardiac catheterization  07/11/2010    Est. EF of 60-65% -- Smooth and normal coronary arteries  -- Normal LV systolic function   . Total knee arthroplasty  06/20/2009    left knee  . Total vaginal hysterectomy  07/22/2010    with bilateral salpingo-oophorectomy by Dr. Tresa Res  . Cystocele repair  07/22/2010    Vault suspension, cystocele repair, graft and cystoscopy  . Total knee arthroplasty  12/27/2008    right knee    History  Smoking status  . Never Smoker   Smokeless tobacco  . Never Used    History  Alcohol Use No    Family History  Problem Relation Age of Onset  .  Emphysema Father   . Atrial fibrillation Mother     has pacemaker  . Hypertension Brother     Reviw of Systems:  Reviewed in the HPI.  All other systems are negative.  Physical Exam: BP 118/76  Pulse 66  Ht 5' 8.5" (1.74 m)  Wt 289 lb 6.4 oz (131.271 kg)  BMI 43.36 kg/m2 The patient is alert and oriented x 3.  The mood and affect are normal.   Skin: warm and dry.  Color is normal.    HEENT:   the sclera are nonicteric.  The mucous membranes are moist.  The carotids are 2+ without bruits.  There is no thyromegaly.  There is no JVD.    Lungs: clear.  The chest wall is non tender.    Heart: regular rate with a normal S1 and S2.  There are no murmurs, gallops, or rubs. The PMI is not displaced.     Abdomen: good bowel sounds.  There is no guarding or rebound.  There is no hepatosplenomegaly or tenderness.  There are no masses.   Ext:   Trace edema  The legs are without rashes.  The distal pulses are intact.   Neuro:  Cranial nerves II - XII are intact.   Motor and sensory functions are intact.    The gait is normal.  ECG: January 02, 2013:  NSR at 66.  normal ECG.  Assessment / Plan:

## 2013-01-02 NOTE — Assessment & Plan Note (Signed)
She has maintained NSR

## 2013-01-02 NOTE — Patient Instructions (Addendum)
Your physician wants you to follow-up in: 6 months You will receive a reminder letter in the mail two months in advance. If you don't receive a letter, please call our office to schedule the follow-up appointment.  Your physician recommends that you return for a FASTING lipid profile: 6 months   Your physician recommends that you continue on your current medications as directed. Please refer to the Current Medication list given to you today.  

## 2013-01-02 NOTE — Assessment & Plan Note (Signed)
Her lipids are mildly elevated.  She does not want to start atorvastatin at this time.  Will check lipids again in 6 months.

## 2013-01-02 NOTE — Assessment & Plan Note (Signed)
[name]'s BP is well controlled.  She should continue with her weight loss efforts.

## 2013-01-24 ENCOUNTER — Encounter: Payer: Self-pay | Admitting: Certified Nurse Midwife

## 2013-01-25 ENCOUNTER — Ambulatory Visit (INDEPENDENT_AMBULATORY_CARE_PROVIDER_SITE_OTHER): Payer: MEDICARE | Admitting: Certified Nurse Midwife

## 2013-01-25 ENCOUNTER — Encounter: Payer: Self-pay | Admitting: Certified Nurse Midwife

## 2013-01-25 VITALS — BP 122/78 | HR 60 | Resp 16 | Ht 68.25 in | Wt 295.0 lb

## 2013-01-25 DIAGNOSIS — Z0000 Encounter for general adult medical examination without abnormal findings: Secondary | ICD-10-CM

## 2013-01-25 DIAGNOSIS — Z01419 Encounter for gynecological examination (general) (routine) without abnormal findings: Secondary | ICD-10-CM

## 2013-01-25 DIAGNOSIS — E559 Vitamin D deficiency, unspecified: Secondary | ICD-10-CM

## 2013-01-25 LAB — POCT URINALYSIS DIPSTICK
Bilirubin, UA: NEGATIVE
Blood, UA: NEGATIVE
Ketones, UA: NEGATIVE
Nitrite, UA: NEGATIVE
Protein, UA: NEGATIVE
pH, UA: 5

## 2013-01-25 NOTE — Progress Notes (Signed)
67 y.o. G90P2002 Married Caucasian Fe here for annual exam.  Menopausal no HRT. Denies vaginal bleeding or vaginal dryness. Recent cardiologist visit all normal, except for elevated cholesterol, treating with diet. Colonoscopy due in one year per GI. No health issues today.   Patient's last menstrual period was 07/22/2010.          Sexually active: yes  The current method of family planning is status post hysterectomy.    Exercising: yes  stationary bike,house/yardwork Smoker:  no  Health Maintenance: Pap:  01-02-10 rare ASCUS-H Colpo-neg MMG: 10-13-11 Colonoscopy: 2003 BMD:  02-03-12 TDaP: 2005 Labs: Poct urine-neg Self breast exam: done occ   reports that she has never smoked. She has never used smokeless tobacco. She reports that she does not drink alcohol or use illicit drugs.  Past Medical History  Diagnosis Date  . Atrial fibrillation   . Obesity   . Chest pain   . Arthritis   . Hypercholesteremia   . Diabetes mellitus without complication   . Abnormal Pap smear     Rare ASCUS-H  . Hypertension     per pt no htn but needs meds for other reason    Past Surgical History  Procedure Laterality Date  . Cardiac catheterization  07/11/2010    Est. EF of 60-65% -- Smooth and normal coronary arteries  -- Normal LV systolic function   . Total knee arthroplasty  06/20/2009    left knee  . Total vaginal hysterectomy  07/22/2010    with bilateral salpingo-oophorectomy by Dr. Tresa Res  . Cystocele repair  07/22/2010    Vault suspension, cystocele repair, graft and cystoscopy  . Total knee arthroplasty  12/27/2008    right knee  . Tonsillectomy    . Nasal sinus surgery    . Cholecystectomy    . Neuroma surgery      rt foot    Current Outpatient Prescriptions  Medication Sig Dispense Refill  . aspirin 81 MG tablet Take 81 mg by mouth daily.      . Calcium Carbonate-Vit D-Min (GNP CALCIUM PLUS 600 +D PO) Take 1 tablet by mouth daily.       . Cholecalciferol (VITAMIN D PO) Take  4,000 Int'l Units by mouth daily.      Marland Kitchen diltiazem (DILACOR XR) 240 MG 24 hr capsule Take 1 capsule (240 mg total) by mouth daily.  90 capsule  3  . ibuprofen (ADVIL,MOTRIN) 200 MG tablet Take 600 mg by mouth as needed.        Marland Kitchen KETOTIFEN FUMARATE OP Apply to eye 2 (two) times daily.      . multivitamin (THERAGRAN) per tablet Take 1 tablet by mouth daily.        . niacin 500 MG tablet Take 500 mg by mouth daily.       . Omega-3 Fatty Acids (FISH OIL PO) Take 1 capsule by mouth daily. 1200 mg daily      . propranolol (INDERAL) 10 MG tablet Take 1 tablet (10 mg total) by mouth as needed.  30 tablet  5  . ranitidine (ZANTAC) 150 MG capsule Take 150 mg by mouth daily.      Marland Kitchen triamterene-hydrochlorothiazide (MAXZIDE) 75-50 MG per tablet Take 1 tablet by mouth daily.      Marland Kitchen amoxicillin (AMOXIL) 500 MG capsule        No current facility-administered medications for this visit.    Family History  Problem Relation Age of Onset  . Emphysema Father   .  Atrial fibrillation Mother     has pacemaker  . Diabetes Mother   . Hypertension Mother   . Other Brother     bulbar palsy    ROS:  Pertinent items are noted in HPI.  Otherwise, a comprehensive ROS was negative.  Exam:   BP 122/78  Pulse 60  Resp 16  Ht 5' 8.25" (1.734 m)  Wt 295 lb (133.811 kg)  BMI 44.5 kg/m2  LMP 07/22/2010 Height: 5' 8.25" (173.4 cm)  Ht Readings from Last 3 Encounters:  01/25/13 5' 8.25" (1.734 m)  01/02/13 5' 8.5" (1.74 m)  06/29/12 5\' 8"  (1.727 m)    General appearance: alert, cooperative and appears stated age Head: Normocephalic, without obvious abnormality, atraumatic Neck: no adenopathy, supple, symmetrical, trachea midline and thyroid normal to inspection and palpation Lungs: clear to auscultation bilaterally Breasts: normal appearance, no masses or tenderness, No nipple retraction or dimpling, No nipple discharge or bleeding, No axillary or supraclavicular adenopathy Heart: regular rate and  rhythm Abdomen: soft, non-tender; no masses,  no organomegaly Extremities: extremities normal, atraumatic, no cyanosis or edema Skin: Skin color, texture, turgor normal. No rashes or lesions Lymph nodes: Cervical, supraclavicular, and axillary nodes normal. No abnormal inguinal nodes palpated Neurologic: Grossly normal   Pelvic: External genitalia:  no lesions              Urethra:  normal appearing urethra with no masses, tenderness or lesions              Bartholin's and Skene's: normal                 Vagina: normal appearing vagina with normal color and discharge, no lesions              Cervix: absent              Pap taken: no Bimanual Exam:  Uterus:  uterus absent              Adnexa: no mass, fullness, tenderness and absent adnexa               Rectovaginal: Confirms               Anus:  normal sphincter tone, no lesions  A:  Well Woman with normal exam  Menopausal no HRT, LAVH/BSO and prolapse repair  History of Vitamin D deficiency   P:   Reviewed health and wellness pertinent to exam  Lab Vit. D  Pap smear as per guidelines   Mammogram yearly pap smear not taken today  counseled on breast self exam, mammography screening, adequate intake of calcium and vitamin D, diet and exercise, Kegel's exercises  return annually or prn  An After Visit Summary was printed and given to the patient.

## 2013-01-25 NOTE — Patient Instructions (Signed)

## 2013-01-26 ENCOUNTER — Telehealth: Payer: Self-pay

## 2013-01-26 LAB — VITAMIN D 25 HYDROXY (VIT D DEFICIENCY, FRACTURES): Vit D, 25-Hydroxy: 61 ng/mL (ref 30–89)

## 2013-01-26 NOTE — Telephone Encounter (Signed)
[email or web address]

## 2013-01-26 NOTE — Telephone Encounter (Signed)
Message copied by Eliezer Bottom on Thu Jan 26, 2013 10:49 AM ------      Message from: Verner Chol      Created: Thu Jan 26, 2013  8:30 AM       Notify patient Vitamin D very good needs to start on daily now 1000 IU Vit. D3  OTC  instead of RX ------

## 2013-01-27 ENCOUNTER — Telehealth: Payer: Self-pay | Admitting: Certified Nurse Midwife

## 2013-01-27 NOTE — Telephone Encounter (Signed)
Left message to call back  

## 2013-01-27 NOTE — Telephone Encounter (Signed)
Patient called about her results 

## 2013-01-27 NOTE — Telephone Encounter (Signed)
Patient calling about her results for her Vit D

## 2013-01-27 NOTE — Telephone Encounter (Signed)
Left message for call back.

## 2013-01-30 NOTE — Telephone Encounter (Signed)
Patient notified

## 2013-01-30 NOTE — Telephone Encounter (Signed)
Patient notified of results.

## 2013-01-30 NOTE — Telephone Encounter (Signed)
Patient ask you to call her back on her home phone and leave a message. She will be out running errands.

## 2013-01-31 NOTE — Progress Notes (Signed)
Note reviewed, agree with plan.  Anterrio Mccleery, MD  

## 2013-02-23 ENCOUNTER — Other Ambulatory Visit: Payer: Self-pay | Admitting: Family Medicine

## 2013-02-23 ENCOUNTER — Ambulatory Visit
Admission: RE | Admit: 2013-02-23 | Discharge: 2013-02-23 | Disposition: A | Discharge status: Home / Self Care | Payer: Medicare Other | Source: Ambulatory Visit | Attending: Family Medicine | Admitting: Family Medicine

## 2013-02-23 DIAGNOSIS — R609 Edema, unspecified: Secondary | ICD-10-CM

## 2013-06-20 ENCOUNTER — Other Ambulatory Visit: Payer: Self-pay | Admitting: Dermatology

## 2013-07-03 ENCOUNTER — Ambulatory Visit (INDEPENDENT_AMBULATORY_CARE_PROVIDER_SITE_OTHER): Payer: Medicare Other | Admitting: *Deleted

## 2013-07-03 DIAGNOSIS — I4891 Unspecified atrial fibrillation: Secondary | ICD-10-CM

## 2013-07-03 DIAGNOSIS — I10 Essential (primary) hypertension: Secondary | ICD-10-CM

## 2013-07-03 DIAGNOSIS — E785 Hyperlipidemia, unspecified: Secondary | ICD-10-CM

## 2013-07-03 LAB — LIPID PANEL
CHOLESTEROL: 166 mg/dL (ref 0–200)
HDL: 45.3 mg/dL (ref >39.00)
LDL Cholesterol: 102 mg/dL — ABNORMAL HIGH (ref 0–99)
TRIGLYCERIDES: 94 mg/dL (ref 0.0–149.0)
Total CHOL/HDL Ratio: 4
VLDL: 18.8 mg/dL (ref 0.0–40.0)

## 2013-07-05 ENCOUNTER — Other Ambulatory Visit: Payer: Self-pay | Admitting: Cardiovascular Disease

## 2013-07-05 ENCOUNTER — Ambulatory Visit (INDEPENDENT_AMBULATORY_CARE_PROVIDER_SITE_OTHER): Payer: Medicare Other | Admitting: Cardiovascular Disease

## 2013-07-05 ENCOUNTER — Encounter: Payer: Self-pay | Admitting: Cardiovascular Disease

## 2013-07-05 VITALS — BP 124/80 | HR 80 | Ht 69.5 in | Wt 293.0 lb

## 2013-07-05 DIAGNOSIS — I4891 Unspecified atrial fibrillation: Secondary | ICD-10-CM

## 2013-07-05 DIAGNOSIS — I10 Essential (primary) hypertension: Secondary | ICD-10-CM

## 2013-07-05 DIAGNOSIS — Z79899 Other long term (current) drug therapy: Secondary | ICD-10-CM

## 2013-07-05 NOTE — Assessment & Plan Note (Signed)
She remains in normal sinus rhythm.  Continue current medicines.

## 2013-07-05 NOTE — Assessment & Plan Note (Signed)
Her BP remains normal - she is on HCTZ that was started for leg swelling and she is on diltiazem for A-fib.

## 2013-07-05 NOTE — Assessment & Plan Note (Signed)
She's been working on an improved diet. She started exercising. Her LDL is now 106. We'll continue to follow this for now. She does not want to take a statin.

## 2013-07-05 NOTE — Patient Instructions (Signed)
Your physician wants you to follow-up in: 6 months  You will receive a reminder letter in the mail two months in advance. If you don't receive a letter, please call our office to schedule the follow-up appointment.   Your physician recommends that you return for a FASTING lipid profile: 6 months lipid/bmet  Your physician recommends that you continue on your current medications as directed. Please refer to the Current Medication list given to you today.

## 2013-07-05 NOTE — Progress Notes (Signed)
[name] Date of Birth  February 12, 1946 [facility] / [facility] [address]     [place], [place]  [address] [phone]  Fax  [phone]  Problem list: 1. Hypertension 2. History of atrial fibrillation 3. Hyperlipidemia   History of Present Illness:  [name] is doing well.  She has decreased her dose of Maxzide and her BP has been elevated.  She has not been exercising on a regular basis but has been trying to exercise more since New's Years Day.  She has lost 6 pounds.  She has not had any chest pain or dyspnea.   She did feel a little bit of extra salt recently.   January 02, 2013:  [name] has done well.  She had some various aches and pains (chest , back)   in Feb.  The pains improved with omeprazol but it caused excessive gas and bloating.  Her lipids have been minimally elevated.    07/05/2013:  [name] is doing well.   She has a sinus infection recently.   She   Current Outpatient Prescriptions on File Prior to Visit  Medication Sig Dispense Refill  . amoxicillin (AMOXIL) 500 MG capsule       . aspirin 81 MG tablet Take 81 mg by mouth daily.      . Calcium Carbonate-Vit D-Min (GNP CALCIUM PLUS 600 +D PO) Take 1 tablet by mouth daily.       . Cholecalciferol (VITAMIN D PO) Take 4,000 Int'l Units by mouth daily.      Marland Kitchen diltiazem (DILACOR XR) 240 MG 24 hr capsule Take 1 capsule (240 mg total) by mouth daily.  90 capsule  3  . ibuprofen (ADVIL,MOTRIN) 200 MG tablet Take 600 mg by mouth as needed.        Marland Kitchen KETOTIFEN FUMARATE OP Apply to eye 2 (two) times daily.      . multivitamin (THERAGRAN) per tablet Take 1 tablet by mouth daily.        . niacin 500 MG tablet Take 500 mg by mouth daily.       . Omega-3 Fatty Acids (FISH OIL PO) Take 1 capsule by mouth daily. 1200 mg daily      . propranolol (INDERAL) 10 MG tablet Take 1 tablet (10 mg total) by mouth as needed.  30 tablet  5  . ranitidine (ZANTAC) 150 MG capsule Take 150 mg by mouth  daily.      Marland Kitchen triamterene-hydrochlorothiazide (MAXZIDE) 75-50 MG per tablet Take 1 tablet by mouth daily.       No current facility-administered medications on file prior to visit.    Allergies  Allergen Reactions  . Erythromycin     Nausea  . Plavix [Clopidogrel Bisulfate]   . Sulfonamide Derivatives     Rash    Past Medical History  Diagnosis Date  . Atrial fibrillation   . Obesity   . Chest pain   . Arthritis   . Hypercholesteremia   . Diabetes mellitus without complication   . Abnormal Pap smear     Rare ASCUS-H  . Hypertension     per pt no htn but needs meds for other reason    Past Surgical History  Procedure Laterality Date  . Cardiac catheterization  07/11/2010    Est. EF of 60-65% -- Smooth and normal coronary arteries  -- Normal LV systolic function   . Total knee arthroplasty  06/20/2009    left knee  . Total vaginal  hysterectomy  07/22/2010    with bilateral salpingo-oophorectomy by Dr. Joan Flores  . Cystocele repair  07/22/2010    Vault suspension, cystocele repair, graft and cystoscopy  . Total knee arthroplasty  12/27/2008    right knee  . Tonsillectomy    . Nasal sinus surgery    . Cholecystectomy    . Neuroma surgery      rt foot    History  Smoking status  . Never Smoker   Smokeless tobacco  . Never Used    History  Alcohol Use No    Family History  Problem Relation Age of Onset  . Emphysema Father   . Atrial fibrillation Mother     has pacemaker  . Diabetes Mother   . Hypertension Mother   . Other Brother     bulbar palsy    Reviw of Systems:  Reviewed in the HPI.  All other systems are negative.  Physical Exam: BP 124/80  Pulse 80  Ht 5' 9.5" (1.765 m)  Wt 293 lb (132.904 kg)  BMI 42.66 kg/m2  LMP 07/22/2010 The patient is alert and oriented x 3.  The mood and affect are normal.   Skin: warm and dry.  Color is normal.    HEENT:   the sclera are nonicteric.  The mucous membranes are moist.  The carotids are 2+ without  bruits.  There is no thyromegaly.  There is no JVD.    Lungs: clear.  The chest wall is non tender.    Heart: regular rate with a normal S1 and S2.  There are no murmurs, gallops, or rubs. The PMI is not displaced.     Abdomen: good bowel sounds.  There is no guarding or rebound.  There is no hepatosplenomegaly or tenderness.  There are no masses.   Ext:   Trace edema  The legs are without rashes.  The distal pulses are intact.   Neuro:  Cranial nerves II - XII are intact.  Motor and sensory functions are intact.    The gait is normal.  ECG: 06/27/2013: Normal sinus rhythm. She has occasional premature atrial contractions.  Assessment / Plan:

## 2013-07-21 ENCOUNTER — Other Ambulatory Visit: Payer: Self-pay | Admitting: Cardiovascular Disease

## 2013-09-20 ENCOUNTER — Other Ambulatory Visit: Payer: Self-pay

## 2013-09-20 DIAGNOSIS — Z1231 Encounter for screening mammogram for malignant neoplasm of breast: Secondary | ICD-10-CM

## 2013-10-13 ENCOUNTER — Ambulatory Visit: Payer: Medicare Other

## 2013-11-01 ENCOUNTER — Telehealth: Payer: Self-pay | Admitting: Certified Nurse Midwife

## 2013-11-01 ENCOUNTER — Ambulatory Visit
Admission: RE | Admit: 2013-11-01 | Discharge: 2013-11-01 | Disposition: A | Discharge status: Home / Self Care | Payer: Medicare Other | Source: Ambulatory Visit

## 2013-11-01 DIAGNOSIS — Z1231 Encounter for screening mammogram for malignant neoplasm of breast: Secondary | ICD-10-CM

## 2013-11-01 NOTE — Telephone Encounter (Signed)
Patient is calling regarding the status of her refund. Patient says she believes all her claims have been paid and is due a refund.

## 2013-11-07 ENCOUNTER — Telehealth: Payer: Self-pay | Admitting: Cardiovascular Disease

## 2013-11-07 NOTE — Telephone Encounter (Signed)
Left message for patient informing her that MyChart sends the automatic notifications regarding immunizations and that if she would like me to add that information into MyChart she may bring a print out from Dr. Arelia Sneddon' office to her next office visit.  I advised the patient to call back with questions or concerns.

## 2013-11-07 NOTE — Telephone Encounter (Signed)
New message     According to mychart---she is due for shots---she had the shots at Dr Claris Gower office.  Please call his office to get the dates

## 2013-12-01 ENCOUNTER — Encounter: Payer: Self-pay | Admitting: Cardiovascular Disease

## 2013-12-26 ENCOUNTER — Ambulatory Visit (INDEPENDENT_AMBULATORY_CARE_PROVIDER_SITE_OTHER): Payer: Medicare Other | Admitting: *Deleted

## 2013-12-26 DIAGNOSIS — I4891 Unspecified atrial fibrillation: Secondary | ICD-10-CM

## 2013-12-26 DIAGNOSIS — R739 Hyperglycemia, unspecified: Secondary | ICD-10-CM

## 2013-12-26 DIAGNOSIS — Z79899 Other long term (current) drug therapy: Secondary | ICD-10-CM

## 2013-12-26 DIAGNOSIS — E785 Hyperlipidemia, unspecified: Secondary | ICD-10-CM

## 2013-12-26 DIAGNOSIS — R7309 Other abnormal glucose: Secondary | ICD-10-CM

## 2013-12-26 LAB — HEMOGLOBIN A1C: Hgb A1c MFr Bld: 6 % (ref 4.6–6.5)

## 2013-12-26 LAB — LIPID PANEL
Cholesterol: 158 mg/dL (ref 0–200)
HDL: 42.3 mg/dL (ref >39.00)
LDL Cholesterol: 96 mg/dL (ref 0–99)
NONHDL: 115.7
Total CHOL/HDL Ratio: 4
Triglycerides: 98 mg/dL (ref 0.0–149.0)
VLDL: 19.6 mg/dL (ref 0.0–40.0)

## 2013-12-26 LAB — BASIC METABOLIC PANEL
BUN: 22 mg/dL (ref 6–23)
CALCIUM: 9.3 mg/dL (ref 8.4–10.5)
CO2: 30 mEq/L (ref 19–32)
Chloride: 105 mEq/L (ref 96–112)
Creatinine, Ser: 0.8 mg/dL (ref 0.4–1.2)
GFR: 71.81 mL/min (ref >60.00)
Glucose, Bld: 116 mg/dL — ABNORMAL HIGH (ref 70–99)
POTASSIUM: 3.4 meq/L — AB (ref 3.5–5.1)
Sodium: 141 mEq/L (ref 135–145)

## 2013-12-27 ENCOUNTER — Telehealth: Payer: Self-pay | Admitting: *Deleted

## 2013-12-27 DIAGNOSIS — R600 Localized edema: Secondary | ICD-10-CM

## 2013-12-27 DIAGNOSIS — I4891 Unspecified atrial fibrillation: Secondary | ICD-10-CM

## 2013-12-27 DIAGNOSIS — E785 Hyperlipidemia, unspecified: Secondary | ICD-10-CM

## 2013-12-27 NOTE — Telephone Encounter (Signed)
Message copied by Nuala Alpha on Wed Dec 27, 2013 12:05 PM ------      Message from: Verna Czech      Created: Tue Dec 26, 2013  4:53 PM                   ----- Message -----         From: Thayer Headings, MD         Sent: 12/26/2013   4:36 PM           To: Verna Czech, RN            Increase intake of potassium       Recheck in 1 month       ------

## 2013-12-27 NOTE — Telephone Encounter (Signed)
Called pt to give her results per Dr Acie Fredrickson.  Informed her that per Dr Acie Fredrickson she should increase the K in her intake of foods and have repeat labs in 1 month.  Pt verbalized understanding and stated she will discuss this matter more thoroughly with Dr Acie Fredrickson at [facility] on Monday 7/27.  Will forward this message to Dr Elmarie Shiley nurse to make her aware.

## 2014-01-01 ENCOUNTER — Ambulatory Visit (INDEPENDENT_AMBULATORY_CARE_PROVIDER_SITE_OTHER): Payer: Medicare Other | Admitting: Cardiovascular Disease

## 2014-01-01 ENCOUNTER — Other Ambulatory Visit: Payer: Self-pay | Admitting: Cardiovascular Disease

## 2014-01-01 ENCOUNTER — Encounter: Payer: Self-pay | Admitting: Cardiovascular Disease

## 2014-01-01 VITALS — BP 122/82 | HR 68 | Ht 69.0 in | Wt 297.0 lb

## 2014-01-01 DIAGNOSIS — I480 Paroxysmal atrial fibrillation: Secondary | ICD-10-CM

## 2014-01-01 DIAGNOSIS — E785 Hyperlipidemia, unspecified: Secondary | ICD-10-CM

## 2014-01-01 DIAGNOSIS — I4891 Unspecified atrial fibrillation: Secondary | ICD-10-CM

## 2014-01-01 NOTE — Progress Notes (Signed)
[name] Date of Birth  [birth year] [facility] / [facility] [address]     [place], [place]  [address] [phone]  Fax  [phone]  Problem list: 1. Hypertension 2. History of atrial fibrillation 3. Hyperlipidemia   History of Present Illness:  [name] is doing well.  She has decreased her dose of Maxzide and her BP has been elevated.  She has not been exercising on a regular basis but has been trying to exercise more since New's Years Day.  She has lost 6 pounds.  She has not had any chest pain or dyspnea.   She did feel a little bit of extra salt recently.   January 02, 2013:  [name] has done well.  She had some various aches and pains (chest , back)   in Feb.  The pains improved with omeprazol but it caused excessive gas and bloating.  Her lipids have been minimally elevated.    07/05/2013:  [name] is doing well.   She has a sinus infection recently.     January 01, 2014:  [name] is doing well.  She has had some sinus infections.    Current Outpatient Prescriptions on File Prior to Visit  Medication Sig Dispense Refill  . aspirin 325 MG EC tablet Take 325 mg by mouth daily.      . Calcium Carbonate-Vit D-Min (GNP CALCIUM PLUS 600 +D PO) Take 1 tablet by mouth daily.       . Cholecalciferol (VITAMIN D PO) Take 1,000 Int'l Units by mouth daily.       Marland Kitchen diltiazem (CARDIZEM CD) 240 MG 24 hr capsule TAKE 1 CAPSULE BY MOUTH DAILY.  90 capsule  1  . ibuprofen (ADVIL,MOTRIN) 200 MG tablet Take 600 mg by mouth as needed.        Marland Kitchen KETOTIFEN FUMARATE OP Apply to eye 2 (two) times daily.      . multivitamin (THERAGRAN) per tablet Take 1 tablet by mouth daily.        . niacin 500 MG tablet Take 500 mg by mouth daily.       . Omega-3 Fatty Acids (FISH OIL PO) Take 1 capsule by mouth daily. 1200 mg daily      . propranolol (INDERAL) 10 MG tablet TAKE 1 TABLET BY MOUTH AS NEEDED.  30 tablet  5  . triamterene-hydrochlorothiazide (MAXZIDE) 75-50  MG per tablet Take 1 tablet by mouth daily.      . [DISCONTINUED] diltiazem (DILACOR XR) 240 MG 24 hr capsule Take 1 capsule (240 mg total) by mouth daily.  90 capsule  3   No current facility-administered medications on file prior to visit.    Allergies  Allergen Reactions  . Erythromycin     Nausea  . Plavix [Clopidogrel Bisulfate]   . Sulfonamide Derivatives     Rash    Past Medical History  Diagnosis Date  . Atrial fibrillation   . Obesity   . Chest pain   . Arthritis   . Hypercholesteremia   . Diabetes mellitus without complication   . Abnormal Pap smear     Rare ASCUS-H  . Hypertension     per pt no htn but needs meds for other reason    Past Surgical History  Procedure Laterality Date  . Cardiac catheterization  07/11/2010    Est. EF of 60-65% -- Smooth and normal coronary arteries  -- Normal LV systolic function   . Total knee arthroplasty  06/20/2009  left knee  . Total vaginal hysterectomy  07/22/2010    with bilateral salpingo-oophorectomy by Dr. Joan Flores  . Cystocele repair  07/22/2010    Vault suspension, cystocele repair, graft and cystoscopy  . Total knee arthroplasty  12/27/2008    right knee  . Tonsillectomy    . Nasal sinus surgery    . Cholecystectomy    . Neuroma surgery      rt foot    History  Smoking status  . Never Smoker   Smokeless tobacco  . Never Used    History  Alcohol Use No    Family History  Problem Relation Age of Onset  . Emphysema Father   . Atrial fibrillation Mother     has pacemaker  . Diabetes Mother   . Hypertension Mother   . Other Brother     bulbar palsy    Reviw of Systems:  Reviewed in the HPI.  All other systems are negative.  Physical Exam: BP 122/82  Pulse 68  Ht 5\' 9"  (1.753 m)  Wt 297 lb (134.718 kg)  BMI 43.84 kg/m2  LMP 07/22/2010 The patient is alert and oriented x 3.  The mood and affect are normal.   Skin: warm and dry.  Color is normal.    HEENT:   the sclera are nonicteric.   The mucous membranes are moist.  The carotids are 2+ without bruits.  There is no thyromegaly.  There is no JVD.    Lungs: clear.  The chest wall is non tender.    Heart: regular rate with a normal S1 and S2.  There are no murmurs, gallops, or rubs. The PMI is not displaced.     Abdomen: good bowel sounds.  There is no guarding or rebound.  There is no hepatosplenomegaly or tenderness.  There are no masses.   Ext:   Trace edema  The legs are without rashes.  The distal pulses are intact.  Early chronic stasis changes.  Neuro:  Cranial nerves II - XII are intact.  Motor and sensory functions are intact.    The gait is normal.  ECG: 06/27/2013: Normal sinus rhythm. She has occasional premature atrial contractions.  Assessment / Plan:

## 2014-01-01 NOTE — Patient Instructions (Signed)
Your physician wants you to follow-up in: 6 months with Dr. Acie Fredrickson. You will receive a reminder letter in the mail two months in advance. If you don't receive a letter, please call our office to schedule the follow-up appointment @ [phone].  Your physician recommends that you continue on your current medications as directed. Please refer to the Current Medication list given to you today.  Copy of labs were given at office visit today.

## 2014-01-01 NOTE — Assessment & Plan Note (Signed)
[name] has improved her diet an her lipids are better.   Continue to exercise.

## 2014-01-01 NOTE — Assessment & Plan Note (Signed)
She remains in NSR.

## 2014-01-29 ENCOUNTER — Ambulatory Visit: Payer: BC Managed Care – PPO | Admitting: Certified Nurse Midwife

## 2014-04-09 ENCOUNTER — Encounter: Payer: Self-pay | Admitting: Cardiovascular Disease

## 2014-05-14 ENCOUNTER — Telehealth: Payer: Self-pay | Admitting: Cardiovascular Disease

## 2014-05-14 NOTE — Telephone Encounter (Signed)
Patient's last office visit does not state time of necessary fasting lab recheck.  I am sending message to Dr. Acie Fredrickson to clarify whether he would like 6 month or 1 year recheck on this patient's fasting labs

## 2014-05-14 NOTE — Telephone Encounter (Signed)
New Message  Pt made recall appt for 1/23 @ 3:00 and wanted to speak with Nurse about possible lab testing that same day. No order was placed for that timeframe.  Please call back and discuss.

## 2014-05-15 NOTE — Telephone Encounter (Signed)
Advised patient that fasting labs not needed at 6 mo office visit; patient verbalized understanding and agreement.

## 2014-05-15 NOTE — Telephone Encounter (Signed)
Will check fasting labs at her 1 year office visit.

## 2014-06-25 ENCOUNTER — Other Ambulatory Visit: Payer: Self-pay | Admitting: Dermatology

## 2014-06-26 ENCOUNTER — Other Ambulatory Visit: Payer: Self-pay | Admitting: Cardiovascular Disease

## 2014-07-02 ENCOUNTER — Encounter: Payer: Self-pay | Admitting: Cardiovascular Disease

## 2014-07-02 ENCOUNTER — Ambulatory Visit (INDEPENDENT_AMBULATORY_CARE_PROVIDER_SITE_OTHER): Payer: Medicare Other | Admitting: Cardiovascular Disease

## 2014-07-02 VITALS — BP 136/68 | HR 71 | Ht 69.0 in | Wt 302.0 lb

## 2014-07-02 DIAGNOSIS — E785 Hyperlipidemia, unspecified: Secondary | ICD-10-CM

## 2014-07-02 DIAGNOSIS — I480 Paroxysmal atrial fibrillation: Secondary | ICD-10-CM

## 2014-07-02 NOTE — Progress Notes (Signed)
Cardiology Office Note   Date:  07/02/2014   ID:  [name], DOB 10-06-1945, MRN [id number]  PCP:  Leonard Downing, MD  Cardiologist:   Thayer Headings, MD   Chief Complaint  Patient presents with  . Atrial Fibrillation      History of Present Illness: [name] is a 69 y.o. female who presents for follow up of her paroxysmal atrial fib  1. Hypertension 2. History of atrial fibrillation 3. Hyperlipidemia   History of Present Illness:  [name] is doing well. She has decreased her dose of Maxzide and her BP has been elevated. She has not been exercising on a regular basis but has been trying to exercise more since New's Years Day. She has lost 6 pounds. She has not had any chest pain or dyspnea.   She did feel a little bit of extra salt recently.   January 02, 2013:  [name] has done well. She had some various aches and pains (chest , back) in Feb. The pains improved with omeprazol but it caused excessive gas and bloating. Her lipids have been minimally elevated.   07/05/2013:  [name] is doing well. She has a sinus infection recently.   January 01, 2014:  [name] is doing well. She has had some sinus infections.   Jan. 25, 2016:  [name] is doing well. No cardiac issues.  Has had a few palpitations - heart racing.  But her HR would be normal if she takes her pulse.  Rides her stationary bike 30 mninutes a day.      Past Medical History  Diagnosis Date  . Atrial fibrillation   . Obesity   . Chest pain   . Arthritis   . Hypercholesteremia   . Diabetes mellitus without complication   . Abnormal Pap smear     Rare ASCUS-H  . Hypertension     per pt no htn but needs meds for other reason    Past Surgical History  Procedure Laterality Date  . Cardiac catheterization  07/11/2010    Est. EF of 60-65% -- Smooth and normal coronary arteries  -- Normal LV systolic function   . Total knee arthroplasty  06/20/2009    left knee  . Total  vaginal hysterectomy  07/22/2010    with bilateral salpingo-oophorectomy by Dr. Joan Flores  . Cystocele repair  07/22/2010    Vault suspension, cystocele repair, graft and cystoscopy  . Total knee arthroplasty  12/27/2008    right knee  . Tonsillectomy    . Nasal sinus surgery    . Cholecystectomy    . Neuroma surgery      rt foot     Current Outpatient Prescriptions  Medication Sig Dispense Refill  . aspirin 325 MG EC tablet Take 325 mg by mouth daily.    . Calcium Carbonate-Vit D-Min (GNP CALCIUM PLUS 600 +D PO) Take 1 tablet by mouth daily.     . Cholecalciferol (VITAMIN D PO) Take 1,000 Int'l Units by mouth daily.     Marland Kitchen diltiazem (CARDIZEM CD) 240 MG 24 hr capsule TAKE 1 CAPSULE BY MOUTH DAILY. 90 capsule 0  . Flaxseed, Linseed, (FLAX SEED OIL) 1000 MG CAPS Take by mouth.    Marland Kitchen ibuprofen (ADVIL,MOTRIN) 200 MG tablet Take 600 mg by mouth as needed.      Marland Kitchen KETOTIFEN FUMARATE OP Apply to eye 2 (two) times daily.    . multivitamin (THERAGRAN) per tablet Take 1 tablet by mouth daily.      Marland Kitchen  niacin 500 MG tablet Take 500 mg by mouth daily.     . Omega-3 Fatty Acids (FISH OIL PO) Take 1 capsule by mouth daily. 1200 mg daily    . propranolol (INDERAL) 10 MG tablet TAKE 1 TABLET BY MOUTH AS NEEDED. 30 tablet 5  . ranitidine (ZANTAC) 150 MG tablet Take 75 mg by mouth daily.    Marland Kitchen triamterene-hydrochlorothiazide (MAXZIDE) 75-50 MG per tablet Take 1 tablet by mouth daily. Patient takes 1/2 tablet daily.    . [DISCONTINUED] diltiazem (DILACOR XR) 240 MG 24 hr capsule Take 1 capsule (240 mg total) by mouth daily. 90 capsule 3   No current facility-administered medications for this visit.    Allergies:   Erythromycin; Plavix; and Sulfonamide derivatives    Social History:  The patient  reports that she has never smoked. She has never used smokeless tobacco. She reports that she does not drink alcohol or use illicit drugs.   Family History:  The patient's family history includes Atrial  fibrillation in her mother; Diabetes in her mother; Emphysema in her father; Hypertension in her mother; Other in her brother.    ROS:  Please see the history of present illness.   Otherwise, review of systems are positive for none.   All other systems are reviewed and negative.    PHYSICAL EXAM: VS:  BP 136/68 mmHg  Pulse 71  Ht 5\' 9"  (1.753 m)  Wt 302 lb (136.986 kg)  BMI 44.58 kg/m2  LMP 07/22/2010 , BMI Body mass index is 44.58 kg/(m^2). GEN: Well nourished, well developed, in no acute distress HEENT: normal Neck: no JVD, carotid bruits, or masses Cardiac: RRR; no murmurs, rubs, or gallops,no edema  Respiratory:  clear to auscultation bilaterally, normal work of breathing GI: soft, nontender, nondistended, + BS MS: no deformity or atrophy Skin: warm and dry, no rash Neuro:  Strength and sensation are intact Psych: normal   EKG:  EKG is ordered today. The ekg ordered today demonstrates NSR at 72.  No St or T wave changes    Recent Labs: 12/26/2013: BUN 22; Creatinine 0.8; Potassium 3.4*; Sodium 141    Lipid Panel    Component Value Date/Time   CHOL 158 12/26/2013 0820   TRIG 98.0 12/26/2013 0820   HDL 42.30 12/26/2013 0820   CHOLHDL 4 12/26/2013 0820   VLDL 19.6 12/26/2013 0820   LDLCALC 96 12/26/2013 0820      Wt Readings from Last 3 Encounters:  07/02/14 302 lb (136.986 kg)  01/01/14 297 lb (134.718 kg)  07/05/13 293 lb (132.904 kg)      Other studies Reviewed: Additional studies/ records that were reviewed today include: . Review of the above records demonstrates:    ASSESSMENT AND PLAN:  1.  Paroxysmal atrial fib:  She's doing very well. She is maintaining normal sinus rhythm.  2. Hypertension: Her blood pressure is well-controlled. Continue current medications. 3. Hyperlipidemia: Continue current medications. We'll check a fasting lipids, liver enzymes, and BMP    Current medicines are reviewed at length with the patient today.  The patient  does not have concerns regarding medicines.  The following changes have been made:  no change  Labs/ tests ordered today include:  No orders of the defined types were placed in this encounter.     Disposition:   FU with me in 1 year    Signed, Nahser, Wonda Cheng, MD  07/02/2014 3:11 PM    [address] [place],  [place]  [address] Phone: [phone]; Fax: [phone]

## 2014-07-02 NOTE — Patient Instructions (Signed)
Your physician recommends that you continue on your current medications as directed. Please refer to the Current Medication list given to you today.  Your physician recommends that you return for lab work in: soon  Your physician wants you to follow-up in: 1 year. You will receive a reminder letter in the mail two months in advance. If you don't receive a letter, please call our office to schedule the follow-up appointment.

## 2014-07-13 ENCOUNTER — Encounter: Payer: Self-pay | Admitting: Cardiovascular Disease

## 2014-10-01 ENCOUNTER — Other Ambulatory Visit: Payer: Self-pay | Admitting: Cardiovascular Disease

## 2014-10-22 ENCOUNTER — Other Ambulatory Visit: Payer: Self-pay

## 2014-10-22 DIAGNOSIS — Z1231 Encounter for screening mammogram for malignant neoplasm of breast: Secondary | ICD-10-CM

## 2014-11-06 ENCOUNTER — Ambulatory Visit
Admission: RE | Admit: 2014-11-06 | Discharge: 2014-11-06 | Disposition: A | Discharge status: Home / Self Care | Payer: Medicare Other | Source: Ambulatory Visit

## 2014-11-06 DIAGNOSIS — Z1231 Encounter for screening mammogram for malignant neoplasm of breast: Secondary | ICD-10-CM

## 2014-11-20 ENCOUNTER — Encounter: Payer: Self-pay | Admitting: Certified Nurse Midwife

## 2014-12-03 ENCOUNTER — Other Ambulatory Visit: Payer: Self-pay

## 2014-12-12 ENCOUNTER — Encounter: Payer: Self-pay | Admitting: Cardiovascular Disease

## 2015-03-13 ENCOUNTER — Ambulatory Visit: Payer: Self-pay | Admitting: Certified Nurse Midwife

## 2015-04-03 ENCOUNTER — Encounter: Payer: Self-pay | Admitting: Certified Nurse Midwife

## 2015-04-03 ENCOUNTER — Ambulatory Visit (INDEPENDENT_AMBULATORY_CARE_PROVIDER_SITE_OTHER): Payer: Medicare Other | Admitting: Certified Nurse Midwife

## 2015-04-03 VITALS — BP 118/82 | HR 76 | Resp 20 | Ht 67.75 in | Wt 297.0 lb

## 2015-04-03 DIAGNOSIS — Z01419 Encounter for gynecological examination (general) (routine) without abnormal findings: Secondary | ICD-10-CM

## 2015-04-03 DIAGNOSIS — E559 Vitamin D deficiency, unspecified: Secondary | ICD-10-CM

## 2015-04-03 NOTE — Patient Instructions (Signed)

## 2015-04-03 NOTE — Progress Notes (Signed)
69 y.o. G33P2002 Married  Caucasian Fe here for annual exam. Menopausal no HRT. Denies vaginal bleeding or vaginal dryness. Sees Dr. Acie Fredrickson for aex/labs/ medication management.Patient checking blood sugar and noticed an elevation with Niacin use. Plans to discuss with Dr.Nahser for advice.Patient needs Vitamin D level check today. Patient has noted more vaginal pressure in the past few months. Works outside frequently, no heavy lifting. Denies any urinary incontinence or leaking or bowel incontinence. No other health concerns  today.  Patient's last menstrual period was 07/22/2010.          Sexually active: Yes.    The current method of family planning is status post hysterectomy.    Exercising: Yes.    yardwork,housework, stationary bike Smoker:  no  Health Maintenance: Pap:  01-02-10 rare ASCUS H colpo done 2011 neg MMG:  11-06-14 category b density,birads 1:neg Colonoscopy:  2016 BMD:   02-03-12 TDaP:  2008 Labs: pcp Self breast exam: done   reports that she has never smoked. She has never used smokeless tobacco. She reports that she drinks alcohol. She reports that she does not use illicit drugs.  Past Medical History  Diagnosis Date  . Atrial fibrillation ([place])   . Obesity   . Chest pain   . Arthritis   . Hypercholesteremia   . Diabetes mellitus without complication ([place])   . Abnormal Pap smear     Rare ASCUS-H  . Hypertension     per pt no htn but needs meds for other reason    Past Surgical History  Procedure Laterality Date  . Cardiac catheterization  07/11/2010    Est. EF of 60-65% -- Smooth and normal coronary arteries  -- Normal LV systolic function   . Total knee arthroplasty  06/20/2009    left knee  . Total vaginal hysterectomy  07/22/2010    with bilateral salpingo-oophorectomy by Dr. Joan Flores  . Cystocele repair  07/22/2010    Vault suspension, cystocele repair, graft and cystoscopy  . Total knee arthroplasty  12/27/2008    right knee  . Tonsillectomy    .  Nasal sinus surgery    . Cholecystectomy    . Neuroma surgery      rt foot    Current Outpatient Prescriptions  Medication Sig Dispense Refill  . amoxicillin (AMOXIL) 500 MG capsule   0  . aspirin 325 MG EC tablet Take 325 mg by mouth daily.    . Calcium Carbonate-Vit D-Min (GNP CALCIUM PLUS 600 +D PO) Take 1 tablet by mouth daily.     . Cholecalciferol (VITAMIN D PO) Take 1,000 Int'l Units by mouth daily.     Marland Kitchen diltiazem (CARDIZEM CD) 240 MG 24 hr capsule TAKE 1 CAPSULE BY MOUTH DAILY. 90 capsule 3  . Flaxseed, Linseed, (FLAX SEED OIL) 1000 MG CAPS Take by mouth.    Marland Kitchen ibuprofen (ADVIL,MOTRIN) 200 MG tablet Take 600 mg by mouth as needed.      Marland Kitchen KETOTIFEN FUMARATE OP Apply to eye 2 (two) times daily.    . multivitamin (THERAGRAN) per tablet Take 1 tablet by mouth daily.      . niacin 500 MG tablet Take 500 mg by mouth daily.     . Omega-3 Fatty Acids (FISH OIL PO) Take 1 capsule by mouth daily. 1200 mg daily    . propranolol (INDERAL) 10 MG tablet TAKE 1 TABLET BY MOUTH AS NEEDED. 30 tablet 5  . ranitidine (ZANTAC) 150 MG tablet Take 75 mg by mouth daily.    Marland Kitchen  triamterene-hydrochlorothiazide (MAXZIDE) 75-50 MG per tablet Take 1 tablet by mouth daily. Patient takes 1/2 tablet daily.    . [DISCONTINUED] diltiazem (DILACOR XR) 240 MG 24 hr capsule Take 1 capsule (240 mg total) by mouth daily. 90 capsule 3   No current facility-administered medications for this visit.    Family History  Problem Relation Age of Onset  . Emphysema Father   . Atrial fibrillation Mother     has pacemaker  . Diabetes Mother   . Hypertension Mother   . Other Brother     bulbar palsy    ROS:  Pertinent items are noted in HPI.  Otherwise, a comprehensive ROS was negative.  Exam:   BP 118/82 mmHg  Pulse 76  Resp 20  Ht 5' 7.75" (1.721 m)  Wt 297 lb (134.718 kg)  BMI 45.48 kg/m2  LMP 07/22/2010 Height: 5' 7.75" (172.1 cm) Ht Readings from Last 3 Encounters:  04/03/15 5' 7.75" (1.721 m)  07/02/14  5\' 9"  (1.753 m)  01/01/14 5\' 9"  (1.753 m)    General appearance: alert, cooperative and appears stated age Head: Normocephalic, without obvious abnormality, atraumatic Neck: no adenopathy, supple, symmetrical, trachea midline and thyroid normal to inspection and palpation Lungs: clear to auscultation bilaterally Breasts: normal appearance, no masses or tenderness, No nipple retraction or dimpling, No nipple discharge or bleeding, No axillary or supraclavicular adenopathy, pendulous Heart: regular rate and rhythm Abdomen: soft, non-tender; no masses,  no organomegaly Extremities: extremities normal, atraumatic, no cyanosis or edema Skin: Skin color, texture, turgor normal. No rashes or lesions Lymph nodes: Cervical, supraclavicular, and axillary nodes normal. No abnormal inguinal nodes palpated Neurologic: Grossly normal   Pelvic: External genitalia:  no lesions              Urethra:  normal appearing urethra with no masses, tenderness or lesions              Bartholin's and Skene's: normal                 Vagina: normal appearing vagina with normal color and discharge, no lesions              Cervix: absent              Pap taken: No. Bimanual Exam:  Uterus:  uterus absent              Adnexa: no mass, fullness, tenderness and adnexa absent bilateral               Rectovaginal: Confirms               Anus:  normal sphincter tone, no lesions  Chaperone present: yes   A:  Well Woman with normal exam  Menopausal no HRT, S/P TAH with BSO for bleeding             History of ASCUS H pap with negative colposcopy, HPVHR negative per chart  Hypertension/hyperlipidemia with PCP stable medications   History of vitamin D deficiency  Obesity  P:   Reviewed health and wellness pertinent to exam  Continue follow up with MD as indicated  Lab: Vitamin D  Pap smear as above not taken  counseled on breast self exam, diet and exercise to help reduce other health issues, calcium and Vitamin D  needs.  return annually or prn  An After Visit Summary was printed and given to the patient.

## 2015-04-04 LAB — VITAMIN D 25 HYDROXY (VIT D DEFICIENCY, FRACTURES): VIT D 25 HYDROXY: 42 ng/mL (ref 30–100)

## 2015-04-07 NOTE — Progress Notes (Signed)
Reviewed personally.  M. Suzanne Dakiya Puopolo, MD.  

## 2015-07-02 ENCOUNTER — Other Ambulatory Visit (INDEPENDENT_AMBULATORY_CARE_PROVIDER_SITE_OTHER): Payer: Medicare Other | Admitting: *Deleted

## 2015-07-02 ENCOUNTER — Ambulatory Visit (INDEPENDENT_AMBULATORY_CARE_PROVIDER_SITE_OTHER): Payer: Medicare Other | Admitting: Cardiovascular Disease

## 2015-07-02 ENCOUNTER — Encounter: Payer: Self-pay | Admitting: Cardiovascular Disease

## 2015-07-02 ENCOUNTER — Telehealth: Payer: Self-pay

## 2015-07-02 VITALS — BP 132/84 | HR 88 | Ht 67.75 in | Wt 296.0 lb

## 2015-07-02 DIAGNOSIS — I10 Essential (primary) hypertension: Secondary | ICD-10-CM | POA: Diagnosis not present

## 2015-07-02 DIAGNOSIS — E785 Hyperlipidemia, unspecified: Secondary | ICD-10-CM

## 2015-07-02 DIAGNOSIS — I119 Hypertensive heart disease without heart failure: Secondary | ICD-10-CM | POA: Diagnosis not present

## 2015-07-02 DIAGNOSIS — I480 Paroxysmal atrial fibrillation: Secondary | ICD-10-CM

## 2015-07-02 HISTORY — DX: Essential (primary) hypertension: I10

## 2015-07-02 LAB — BASIC METABOLIC PANEL
BUN: 18 mg/dL (ref 7–25)
CHLORIDE: 103 mmol/L (ref 98–110)
CO2: 26 mmol/L (ref 20–31)
Calcium: 9.3 mg/dL (ref 8.6–10.4)
Creat: 0.78 mg/dL (ref 0.50–0.99)
Glucose, Bld: 116 mg/dL — ABNORMAL HIGH (ref 65–99)
POTASSIUM: 3.3 mmol/L — AB (ref 3.5–5.3)
SODIUM: 141 mmol/L (ref 135–146)

## 2015-07-02 LAB — LIPID PANEL
CHOLESTEROL: 160 mg/dL (ref 125–200)
HDL: 47 mg/dL (ref >=46)
LDL Cholesterol: 97 mg/dL (ref <130)
Total CHOL/HDL Ratio: 3.4 Ratio (ref <=5.0)
Triglycerides: 79 mg/dL (ref <150)
VLDL: 16 mg/dL (ref <30)

## 2015-07-02 LAB — HEPATIC FUNCTION PANEL
ALT: 20 U/L (ref 6–29)
AST: 18 U/L (ref 10–35)
Albumin: 3.8 g/dL (ref 3.6–5.1)
Alkaline Phosphatase: 63 U/L (ref 33–130)
BILIRUBIN INDIRECT: 0.5 mg/dL (ref 0.2–1.2)
Bilirubin, Direct: 0.1 mg/dL (ref <=0.2)
Total Bilirubin: 0.6 mg/dL (ref 0.2–1.2)
Total Protein: 6.2 g/dL (ref 6.1–8.1)

## 2015-07-02 MED ORDER — APIXABAN 5 MG PO TABS: 5.0000 mg | ORAL_TABLET | Freq: Two times a day (BID) | ORAL | Status: DC

## 2015-07-02 NOTE — Telephone Encounter (Signed)
Prior auth for Eliquis sent to [facility] 

## 2015-07-02 NOTE — Patient Instructions (Signed)
Medication Instructions:  STOP Aspirin STOP Niacin START Eliquis 5 mg twice daily - take 12 hours apart  Labwork: Your physician recommends that you return for lab work (CBC, basic metabolic panel) in: 6 months at your appointment with Dr. Acie Fredrickson   Testing/Procedures: None Ordered   Follow-Up: Your physician wants you to follow-up in: 6 months with Dr. Acie Fredrickson.  You will receive a reminder letter in the mail two months in advance. If you don't receive a letter, please call our office to schedule the follow-up appointment.   If you need a refill on your cardiac medications before your next appointment, please call your pharmacy.   Thank you for choosing [facility]! Christen Bame, RN [phone]

## 2015-07-02 NOTE — Progress Notes (Signed)
Cardiology Office Note   Date:  07/02/2015   ID:  [name], DOB 23-Mar-1946, MRN [id number]  PCP:  Leonard Downing, MD  Cardiologist:   Thayer Headings, MD   Chief Complaint  Patient presents with  . Follow-up    atrial fib      History of Present Illness: [name] is a 70 y.o. female who presents for follow up of her paroxysmal atrial fib  1. Hypertension 2. History of atrial fibrillation 3. Hyperlipidemia   History of Present Illness:  [name] is doing well. She has decreased her dose of Maxzide and her BP has been elevated. She has not been exercising on a regular basis but has been trying to exercise more since New's Years Day. She has lost 6 pounds. She has not had any chest pain or dyspnea.   She did feel a little bit of extra salt recently.   January 02, 2013:  [name] has done well. She had some various aches and pains (chest , back) in Feb. The pains improved with omeprazol but it caused excessive gas and bloating. Her lipids have been minimally elevated.   07/05/2013:  [name] is doing well. She has a sinus infection recently.   January 01, 2014:  [name] is doing well. She has had some sinus infections.   Jan. 25, 2016:  [name] is doing well. No cardiac issues.  Has had a few palpitations - heart racing.  But her HR would be normal if she takes her pulse.  Rides her stationary bike 30 mninutes a day.   Jan. 24, 2017:  [name] is seen for follow up of her atrial fib.  CHADS2VASC = 3 ( female, HTN, AGE > 56)  She has PAF - was in NSR at her last visit. Is back in A-Fib today .  Asymptomatic.  No CP or dyspnea  Has occasional right sided chest pain .   Has right shoulder pain and is seeing Dr. Onnie Graham soon Had a Myoivew in 2012 - ( inf. Defect) .  Subsequent cath showed normal coronary arteries.     Past Medical History  Diagnosis Date  . Atrial fibrillation ([place])   . Obesity   . Chest pain   . Arthritis   .  Hypercholesteremia   . Diabetes mellitus without complication ([place])   . Abnormal Pap smear     Rare ASCUS-H  . Hypertension     per pt no htn but needs meds for other reason    Past Surgical History  Procedure Laterality Date  . Cardiac catheterization  07/11/2010    Est. EF of 60-65% -- Smooth and normal coronary arteries  -- Normal LV systolic function   . Total knee arthroplasty  06/20/2009    left knee  . Total vaginal hysterectomy  07/22/2010    with bilateral salpingo-oophorectomy by Dr. Joan Flores  . Cystocele repair  07/22/2010    Vault suspension, cystocele repair, graft and cystoscopy  . Total knee arthroplasty  12/27/2008    right knee  . Tonsillectomy    . Nasal sinus surgery    . Cholecystectomy    . Neuroma surgery      rt foot     Current Outpatient Prescriptions  Medication Sig Dispense Refill  . amoxicillin (AMOXIL) 500 MG capsule 1,000 mg. Take 2000mg   prior dental procedures  0  . aspirin 325 MG EC tablet Take 325 mg by mouth daily.    . Calcium Carbonate-Vit D-Min (GNP CALCIUM PLUS  600 +D PO) Take 1 tablet by mouth daily.     Marland Kitchen diltiazem (CARDIZEM CD) 240 MG 24 hr capsule TAKE 1 CAPSULE BY MOUTH DAILY. 90 capsule 3  . Flaxseed, Linseed, (FLAX SEED OIL) 1000 MG CAPS Take by mouth.    Marland Kitchen ibuprofen (ADVIL,MOTRIN) 200 MG tablet Take 600 mg by mouth as needed.      Marland Kitchen KETOTIFEN FUMARATE OP Apply to eye 2 (two) times daily.    . multivitamin (THERAGRAN) per tablet Take 1 tablet by mouth daily.      . niacin 500 MG tablet Take 500 mg by mouth daily.     . Omega-3 Fatty Acids (FISH OIL PO) Take 1 capsule by mouth daily. 1200 mg daily    . propranolol (INDERAL) 10 MG tablet TAKE 1 TABLET BY MOUTH AS NEEDED. 30 tablet 5  . ranitidine (ZANTAC) 150 MG tablet Take 75 mg by mouth daily.    Marland Kitchen triamterene-hydrochlorothiazide (MAXZIDE) 75-50 MG per tablet Take 1 tablet by mouth daily. Patient takes 1/2 tablet daily.    . [DISCONTINUED] diltiazem (DILACOR XR) 240 MG 24 hr  capsule Take 1 capsule (240 mg total) by mouth daily. 90 capsule 3   No current facility-administered medications for this visit.    Allergies:   Erythromycin; Plavix; and Sulfonamide derivatives    Social History:  The patient  reports that she has never smoked. She has never used smokeless tobacco. She reports that she drinks alcohol. She reports that she does not use illicit drugs.   Family History:  The patient's family history includes Atrial fibrillation in her mother; Diabetes in her mother; Emphysema in her father; Hypertension in her mother; Other in her brother.    ROS:  Please see the history of present illness.   Otherwise, review of systems are positive for none.   All other systems are reviewed and negative.    PHYSICAL EXAM: VS:  BP 132/84 mmHg  Pulse 88  Ht 5' 7.75" (1.721 m)  Wt 296 lb (134.265 kg)  BMI 45.33 kg/m2  LMP 07/22/2010 , BMI Body mass index is 45.33 kg/(m^2). GEN: Well nourished, well developed, in no acute distress HEENT: normal Neck: no JVD, carotid bruits, or masses Cardiac: RRR; no murmurs, rubs, or gallops,no edema  Respiratory:  clear to auscultation bilaterally, normal work of breathing GI: soft, nontender, nondistended, + BS MS: no deformity or atrophy Skin: warm and dry, no rash Neuro:  Strength and sensation are intact Psych: normal   EKG:  EKG is ordered today. The ekg ordered today demonstrates Atrial fib at 88.  NS ST abn.    Recent Labs: No results found for requested labs within last 365 days.    Lipid Panel    Component Value Date/Time   CHOL 158 12/26/2013 0820   TRIG 98.0 12/26/2013 0820   HDL 42.30 12/26/2013 0820   CHOLHDL 4 12/26/2013 0820   VLDL 19.6 12/26/2013 0820   LDLCALC 96 12/26/2013 0820      Wt Readings from Last 3 Encounters:  07/02/15 296 lb (134.265 kg)  04/03/15 297 lb (134.718 kg)  07/02/14 302 lb (136.986 kg)      Other studies Reviewed: Additional studies/ records that were reviewed today  include: . Review of the above records demonstrates:    ASSESSMENT AND PLAN:  1.  Paroxysmal atrial fib:  She's doing very well. She is back in A-Fib. Will DC ASA, start Eliquis 5 BID. Refill Propranolol  Will see her in 6 months,  check BMP and CBC in 6 months.   2. Hypertension: Her blood pressure is well-controlled. Continue current medications. 3. Hyperlipidemia: Continue current medications. We'll check a fasting lipids, liver enzymes, and BMP    Current medicines are reviewed at length with the patient today.  The patient does not have concerns regarding medicines.  The following changes have been made:  no change  Labs/ tests ordered today include:  No orders of the defined types were placed in this encounter.     Disposition:   FU with me in 6 months    Signed, Nahser, Wonda Cheng, MD  07/02/2015 8:16 AM    [facility] [address], [place], [place]  [address] Phone: [phone]; Fax: [phone]

## 2015-07-04 ENCOUNTER — Telehealth: Payer: Self-pay

## 2015-07-04 NOTE — Telephone Encounter (Signed)
Eliquis approved through 06/07/2016. [id number]

## 2015-07-06 ENCOUNTER — Other Ambulatory Visit: Payer: Self-pay | Admitting: Cardiovascular Disease

## 2015-09-14 ENCOUNTER — Other Ambulatory Visit: Payer: Self-pay | Admitting: Cardiovascular Disease

## 2015-10-07 ENCOUNTER — Other Ambulatory Visit: Payer: Self-pay

## 2015-10-07 DIAGNOSIS — Z1231 Encounter for screening mammogram for malignant neoplasm of breast: Secondary | ICD-10-CM

## 2015-11-07 ENCOUNTER — Ambulatory Visit
Admission: RE | Admit: 2015-11-07 | Discharge: 2015-11-07 | Disposition: A | Discharge status: Home / Self Care | Payer: Medicare Other | Source: Ambulatory Visit

## 2015-11-07 DIAGNOSIS — Z1231 Encounter for screening mammogram for malignant neoplasm of breast: Secondary | ICD-10-CM

## 2015-11-11 ENCOUNTER — Telehealth: Payer: Self-pay | Admitting: Cardiovascular Disease

## 2015-11-11 NOTE — Telephone Encounter (Signed)
Spoke with patient who states she is scheduled for finger surgery on June 19 and received instruction from Dr. Lequita Asal office to stop Eliquis.  I advised her that Dr. Acie Fredrickson has already signed clearance from [facility] and it has been faxed.  She thanked me for the call.

## 2015-11-11 NOTE — Telephone Encounter (Signed)
Pt having surgery and needs to come off Eliquis for 5 days -pls call [phone]

## 2015-12-16 ENCOUNTER — Other Ambulatory Visit (INDEPENDENT_AMBULATORY_CARE_PROVIDER_SITE_OTHER): Payer: Medicare Other | Admitting: *Deleted

## 2015-12-16 DIAGNOSIS — I480 Paroxysmal atrial fibrillation: Secondary | ICD-10-CM | POA: Diagnosis not present

## 2015-12-16 DIAGNOSIS — I119 Hypertensive heart disease without heart failure: Secondary | ICD-10-CM

## 2015-12-16 LAB — BASIC METABOLIC PANEL
BUN: 19 mg/dL (ref 7–25)
CALCIUM: 8.9 mg/dL (ref 8.6–10.4)
CHLORIDE: 103 mmol/L (ref 98–110)
CO2: 29 mmol/L (ref 20–31)
CREATININE: 0.83 mg/dL (ref 0.50–0.99)
Glucose, Bld: 106 mg/dL — ABNORMAL HIGH (ref 65–99)
Potassium: 3.1 mmol/L — ABNORMAL LOW (ref 3.5–5.3)
SODIUM: 140 mmol/L (ref 135–146)

## 2015-12-16 LAB — CBC WITH DIFFERENTIAL/PLATELET
Basophils Absolute: 0 cells/uL (ref 0–200)
Basophils Relative: 0 %
EOS ABS: 166 {cells}/uL (ref 15–500)
Eosinophils Relative: 2 %
HEMATOCRIT: 42.5 % (ref 35.0–45.0)
Hemoglobin: 14.2 g/dL (ref 11.7–15.5)
LYMPHS PCT: 32 %
Lymphs Abs: 2656 cells/uL (ref 850–3900)
MCH: 29.3 pg (ref 27.0–33.0)
MCHC: 33.4 g/dL (ref 32.0–36.0)
MCV: 87.6 fL (ref 80.0–100.0)
MONO ABS: 747 {cells}/uL (ref 200–950)
MPV: 10.6 fL (ref 7.5–12.5)
Monocytes Relative: 9 %
NEUTROS PCT: 57 %
Neutro Abs: 4731 cells/uL (ref 1500–7800)
Platelets: 233 10*3/uL (ref 140–400)
RBC: 4.85 MIL/uL (ref 3.80–5.10)
RDW: 14.4 % (ref 11.0–15.0)
WBC: 8.3 10*3/uL (ref 3.8–10.8)

## 2015-12-17 ENCOUNTER — Other Ambulatory Visit: Payer: Medicare Other

## 2015-12-18 ENCOUNTER — Telehealth: Payer: Self-pay | Admitting: Nurse Practitioner

## 2015-12-18 DIAGNOSIS — E876 Hypokalemia: Secondary | ICD-10-CM

## 2015-12-18 MED ORDER — POTASSIUM CHLORIDE ER 10 MEQ PO TBCR: 10.0000 meq | EXTENDED_RELEASE_TABLET | Freq: Every day | ORAL | Status: DC

## 2015-12-18 NOTE — Telephone Encounter (Signed)
Results and plan of care reviewed with patient who verbalized understanding and agreement to start kdur 10 meq once daily.  She is coming into the office tomorrow to see Dr. Acie Fredrickson and we will schedule her repeat bmet at that time.

## 2015-12-18 NOTE — Telephone Encounter (Signed)
-----   Message from Thayer Headings, MD sent at 12/17/2015  8:53 AM EDT ----- Potassium is low ( 3.1)  Add Kdur 10 meq a day , Recheck BMP in 1 months

## 2015-12-19 ENCOUNTER — Ambulatory Visit (INDEPENDENT_AMBULATORY_CARE_PROVIDER_SITE_OTHER): Payer: Medicare Other | Admitting: Cardiovascular Disease

## 2015-12-19 ENCOUNTER — Encounter: Payer: Self-pay | Admitting: Cardiovascular Disease

## 2015-12-19 VITALS — BP 132/84 | HR 77 | Ht 67.75 in | Wt 289.4 lb

## 2015-12-19 DIAGNOSIS — I480 Paroxysmal atrial fibrillation: Secondary | ICD-10-CM

## 2015-12-19 DIAGNOSIS — I10 Essential (primary) hypertension: Secondary | ICD-10-CM | POA: Diagnosis not present

## 2015-12-19 NOTE — Progress Notes (Signed)
Cardiology Office Note   Date:  12/19/2015   ID:  [name], DOB [birth year], MRN [id number]  PCP:  Leonard Downing, MD  Cardiologist:   Mertie Moores, MD   Chief Complaint  Patient presents with  . Follow-up    paroxysmal atrial fib       History of Present Illness: [name] is a 70 y.o. female who presents for follow up of her paroxysmal atrial fib  1. Hypertension 2. History of atrial fibrillation 3. Hyperlipidemia   History of Present Illness:  [name] is doing well. She has decreased her dose of Maxzide and her BP has been elevated. She has not been exercising on a regular basis but has been trying to exercise more since New's Years Day. She has lost 6 pounds. She has not had any chest pain or dyspnea.   She did feel a little bit of extra salt recently.   January 02, 2013:  [name] has done well. She had some various aches and pains (chest , back) in Feb. The pains improved with omeprazol but it caused excessive gas and bloating. Her lipids have been minimally elevated.   07/05/2013:  [name] is doing well. She has a sinus infection recently.   January 01, 2014:  [name] is doing well. She has had some sinus infections.   Jan. 25, 2016:  [name] is doing well. No cardiac issues.  Has had a few palpitations - heart racing.  But her HR would be normal if she takes her pulse.  Rides her stationary bike 30 mninutes a day.   Jan. 24, 2017:  [name] is seen for follow up of her atrial fib.  CHADS2VASC = 3 ( female, HTN, AGE > 18)  She has PAF - was in NSR at her last visit. Is back in A-Fib today .  Asymptomatic.  No CP or dyspnea  Has occasional right sided chest pain .   Has right shoulder pain and is seeing Dr. Onnie Graham soon Had a Myoivew in 2012 - ( inf. Defect) .  Subsequent cath showed normal coronary arteries.   December 19, 2015: [name] is back in NSR today  Has PAF , has never needed cardioversion. Takes propranolol as needed. Has  some dizziness / orthostasis  Some of her dizziness is not related to orthostasis   Past Medical History  Diagnosis Date  . Atrial fibrillation ([place])   . Obesity   . Chest pain   . Arthritis   . Hypercholesteremia   . Diabetes mellitus without complication ([name])   . Abnormal Pap smear     Rare ASCUS-H  . Hypertension     per pt no htn but needs meds for other reason    Past Surgical History  Procedure Laterality Date  . Cardiac catheterization  07/11/2010    Est. EF of 60-65% -- Smooth and normal coronary arteries  -- Normal LV systolic function   . Total knee arthroplasty  06/20/2009    left knee  . Total vaginal hysterectomy  07/22/2010    with bilateral salpingo-oophorectomy by Dr. Joan Flores  . Cystocele repair  07/22/2010    Vault suspension, cystocele repair, graft and cystoscopy  . Total knee arthroplasty  12/27/2008    right knee  . Tonsillectomy    . Nasal sinus surgery    . Cholecystectomy    . Neuroma surgery      rt foot     Current Outpatient Prescriptions  Medication Sig Dispense Refill  . amoxicillin (  AMOXIL) 500 MG capsule 1,000 mg. Take 2000mg   prior dental procedures  0  . apixaban (ELIQUIS) 5 MG TABS tablet Take 1 tablet (5 mg total) by mouth 2 (two) times daily. 60 tablet 11  . Ascorbic Acid (VITAMIN C) 1000 MG tablet Take 1,000 mg by mouth daily.    . Calcium Carbonate-Vit D-Min (GNP CALCIUM PLUS 600 +D PO) Take 1 tablet by mouth daily.     Marland Kitchen diltiazem (CARDIZEM CD) 240 MG 24 hr capsule TAKE 1 CAPSULE BY MOUTH DAILY. 90 capsule 2  . Flaxseed, Linseed, (FLAX SEED OIL) 1000 MG CAPS Take 1,000 mg by mouth daily.     . multivitamin (THERAGRAN) per tablet Take 1 tablet by mouth daily.      . Omega-3 Fatty Acids (FISH OIL PO) Take 1 capsule by mouth daily. 1200 mg daily    . potassium chloride (K-DUR) 10 MEQ tablet Take 1 tablet (10 mEq total) by mouth daily. 30 tablet 11  . propranolol (INDERAL) 10 MG tablet TAKE 1 TABLET BY MOUTH AS NEEDED. (Patient taking  differently: TAKE 1 TABLET BY MOUTH AS NEEDED FOR PAPITATIONS) 30 tablet 5  . pyridOXINE (VITAMIN B-6) 100 MG tablet Take 100 mg by mouth 2 (two) times daily.    . ranitidine (ZANTAC) 150 MG tablet Take 75 mg by mouth daily.    Marland Kitchen triamterene-hydrochlorothiazide (MAXZIDE) 75-50 MG per tablet Take 1 tablet by mouth daily. Patient takes 1/2 tablet daily.    . [DISCONTINUED] diltiazem (DILACOR XR) 240 MG 24 hr capsule Take 1 capsule (240 mg total) by mouth daily. 90 capsule 3   No current facility-administered medications for this visit.    Allergies:   Erythromycin; Plavix; and Sulfonamide derivatives    Social History:  The patient  reports that she has never smoked. She has never used smokeless tobacco. She reports that she drinks alcohol. She reports that she does not use illicit drugs.   Family History:  The patient's family history includes Atrial fibrillation in her mother; Diabetes in her mother; Emphysema in her father; Hypertension in her mother; Other in her brother.    ROS:  Please see the history of present illness.   Otherwise, review of systems are positive for none.   All other systems are reviewed and negative.    PHYSICAL EXAM: VS:  BP 132/84 mmHg  Pulse 77  Ht 5' 7.75" (1.721 m)  Wt 289 lb 6.4 oz (131.271 kg)  BMI 44.32 kg/m2  LMP 07/22/2010 , BMI Body mass index is 44.32 kg/(m^2). GEN: Well nourished, well developed, in no acute distress HEENT: normal Neck: no JVD, carotid bruits, or masses Cardiac: RRR; no murmurs, rubs, or gallops, mild leg  edema  Respiratory:  clear to auscultation bilaterally, normal work of breathing GI: soft, nontender, nondistended, + BS MS: no deformity or atrophy, Small dark spot on right lower leg ( advised her to see Derm)  Skin: warm and dry, no rash Neuro:  Strength and sensation are intact Psych: normal  EKG:  EKG is ordered today. The ekg ordered today demonstrates NSR at 77.   Normal ECG    Recent Labs: 07/02/2015: ALT  20 12/16/2015: BUN 19; Creat 0.83; Hemoglobin 14.2; Platelets 233; Potassium 3.1*; Sodium 140    Lipid Panel    Component Value Date/Time   CHOL 160 07/02/2015 0737   TRIG 79 07/02/2015 0737   HDL 47 07/02/2015 0737   CHOLHDL 3.4 07/02/2015 0737   VLDL 16 07/02/2015 0737   LDLCALC  97 07/02/2015 0737      Wt Readings from Last 3 Encounters:  12/19/15 289 lb 6.4 oz (131.271 kg)  07/02/15 296 lb (134.265 kg)  04/03/15 297 lb (134.718 kg)      Other studies Reviewed: Additional studies/ records that were reviewed today include: . Review of the above records demonstrates:    ASSESSMENT AND PLAN:  1.  Paroxysmal atrial fib:  She's doing very well.   On Eliquis 5 BID. Refill Propranolol  Will see her in 6 months, check BMP    2. Hypertension: Her blood pressure is well-controlled. Continue current medications.  3. Hyperlipidemia: Continue current medications. We'll check a fasting lipids, liver enzymes, and BMP  4. Obesity:   Advised watching her diet  Current medicines are reviewed at length with the patient today.  The patient does not have concerns regarding medicines.  The following changes have been made:  no change  Labs/ tests ordered today include:  No orders of the defined types were placed in this encounter.    Disposition:   FU with me in 6 months   Signed, Mertie Moores, MD  12/19/2015 10:37 AM    [facility] [address], [place], [place]  [address] Phone: [phone]; Fax: [phone]

## 2015-12-19 NOTE — Patient Instructions (Addendum)
Medication Instructions:  Your physician recommends that you continue on your current medications as directed. Please refer to the Current Medication list given to you today.   Labwork: Your physician recommends that you return for lab work in: 1 month for basic metabolic panel - on Tuesday August 15 - anytime between 7:30 am and 4:30 pm   Testing/Procedures: None Ordered   Follow-Up: Your physician wants you to follow-up in: 6 months with Dr. Acie Fredrickson.  You will receive a reminder letter in the mail two months in advance. If you don't receive a letter, please call our office to schedule the follow-up appointment.   If you need a refill on your cardiac medications before your next appointment, please call your pharmacy.   Thank you for choosing [facility]! Christen Bame, RN [phone]

## 2016-01-21 ENCOUNTER — Other Ambulatory Visit (INDEPENDENT_AMBULATORY_CARE_PROVIDER_SITE_OTHER): Payer: Medicare Other

## 2016-01-21 DIAGNOSIS — E876 Hypokalemia: Secondary | ICD-10-CM

## 2016-01-21 LAB — BASIC METABOLIC PANEL
BUN: 23 mg/dL (ref 7–25)
CHLORIDE: 106 mmol/L (ref 98–110)
CO2: 30 mmol/L (ref 20–31)
Calcium: 9.1 mg/dL (ref 8.6–10.4)
Creat: 0.79 mg/dL (ref 0.50–0.99)
Glucose, Bld: 115 mg/dL — ABNORMAL HIGH (ref 65–99)
POTASSIUM: 3.6 mmol/L (ref 3.5–5.3)
Sodium: 142 mmol/L (ref 135–146)

## 2016-02-28 ENCOUNTER — Ambulatory Visit (HOSPITAL_COMMUNITY)
Admission: EM | Admit: 2016-02-28 | Discharge: 2016-02-28 | Disposition: A | Discharge status: Home / Self Care | Payer: Medicare Other | Attending: Internal Medicine | Admitting: Internal Medicine

## 2016-02-28 ENCOUNTER — Ambulatory Visit (INDEPENDENT_AMBULATORY_CARE_PROVIDER_SITE_OTHER): Payer: Medicare Other

## 2016-02-28 ENCOUNTER — Encounter (HOSPITAL_COMMUNITY): Payer: Self-pay | Admitting: *Deleted

## 2016-02-28 DIAGNOSIS — S63501A Unspecified sprain of right wrist, initial encounter: Secondary | ICD-10-CM

## 2016-02-28 DIAGNOSIS — W19XXXA Unspecified fall, initial encounter: Secondary | ICD-10-CM

## 2016-02-28 DIAGNOSIS — S8002XA Contusion of left knee, initial encounter: Secondary | ICD-10-CM | POA: Diagnosis not present

## 2016-02-28 NOTE — ED Notes (Signed)
Splint

## 2016-02-28 NOTE — ED Triage Notes (Signed)
Pt reports  He  Felled     And   inj     Her  r    Wrist and  l  Knee     She  Reports  -     She felled   At  [employer]     She  Did  Not  Black  Out  She  Remembers  -      Everything       she    Is  Able  To  Walk

## 2016-02-28 NOTE — ED Notes (Signed)
Medium   r  Arm  Splint applied

## 2016-02-28 NOTE — Discharge Instructions (Signed)
Luckily no fractures or damage to your knee replacement. Treat wrist and knee with ice over the next 24 hours. Wear wrist splint for at least 1 week to allow healing, ok to wear with anything repetitive after that if needed. May use tylenol as directed for pain. F/U with your orthopedist if you are not improving.

## 2016-02-28 NOTE — ED Provider Notes (Signed)
CSN: [id number]     Arrival date & time 02/28/16  1627 History   None    Chief Complaint  Patient presents with  . Fall   (Consider location/radiation/quality/duration/timing/severity/associated sxs/prior Treatment) Patient presents after a fall today. She tripped and landed on left anterior knee replacement and right wrist hyperextended to brace fall. Pain in both joints. Mild swelling. Pain along radial side of right wrist and anterior left knee.       Past Medical History:  Diagnosis Date  . Abnormal Pap smear    Rare ASCUS-H  . Arthritis   . Atrial fibrillation ([place])   . Chest pain   . Diabetes mellitus without complication ([place])   . Hypercholesteremia   . Hypertension    per pt no htn but needs meds for other reason  . Obesity    Past Surgical History:  Procedure Laterality Date  . CARDIAC CATHETERIZATION  07/11/2010   Est. EF of 60-65% -- Smooth and normal coronary arteries  -- Normal LV systolic function   . CHOLECYSTECTOMY    . CYSTOCELE REPAIR  07/22/2010   Vault suspension, cystocele repair, graft and cystoscopy  . NASAL SINUS SURGERY    . NEUROMA SURGERY     rt foot  . TONSILLECTOMY    . TOTAL KNEE ARTHROPLASTY  06/20/2009   left knee  . TOTAL KNEE ARTHROPLASTY  12/27/2008   right knee  . TOTAL VAGINAL HYSTERECTOMY  07/22/2010   with bilateral salpingo-oophorectomy by Dr. Joan Flores   Family History  Problem Relation Age of Onset  . Emphysema Father   . Atrial fibrillation Mother     has pacemaker  . Diabetes Mother   . Hypertension Mother   . Other Brother     bulbar palsy   Social History  Substance Use Topics  . Smoking status: Never Smoker  . Smokeless tobacco: Never Used  . Alcohol use 0.0 - 0.6 oz/week   OB History    Gravida Para Term Preterm AB Living   2 2 2     2    SAB TAB Ectopic Multiple Live Births           2     Review of Systems  Allergies  Erythromycin; Plavix [clopidogrel bisulfate]; and Sulfonamide derivatives  Home  Medications   Prior to Admission medications   Medication Sig Start Date End Date Taking? Authorizing Provider  amoxicillin (AMOXIL) 500 MG capsule 1,000 mg. Take 2000mg   prior dental procedures 03/28/15   Historical Provider, MD  apixaban (ELIQUIS) 5 MG TABS tablet Take 1 tablet (5 mg total) by mouth 2 (two) times daily. 07/02/15   Thayer Headings, MD  Ascorbic Acid (VITAMIN C) 1000 MG tablet Take 1,000 mg by mouth daily.    Historical Provider, MD  Calcium Carbonate-Vit D-Min (GNP CALCIUM PLUS 600 +D PO) Take 1 tablet by mouth daily.     Historical Provider, MD  diltiazem (CARDIZEM CD) 240 MG 24 hr capsule TAKE 1 CAPSULE BY MOUTH DAILY. 09/16/15   Thayer Headings, MD  Flaxseed, Linseed, (FLAX SEED OIL) 1000 MG CAPS Take 1,000 mg by mouth daily.     Historical Provider, MD  multivitamin [facility]) per tablet Take 1 tablet by mouth daily.      Historical Provider, MD  Omega-3 Fatty Acids (FISH OIL PO) Take 1 capsule by mouth daily. 1200 mg daily    Historical Provider, MD  potassium chloride (K-DUR) 10 MEQ tablet Take 1 tablet (10 mEq total) by mouth daily.  12/18/15   Thayer Headings, MD  propranolol (INDERAL) 10 MG tablet TAKE 1 TABLET BY MOUTH AS NEEDED. Patient taking differently: TAKE 1 TABLET BY MOUTH AS NEEDED FOR PAPITATIONS 07/08/15   Thayer Headings, MD  pyridOXINE (VITAMIN B-6) 100 MG tablet Take 100 mg by mouth 2 (two) times daily.    Historical Provider, MD  ranitidine (ZANTAC) 150 MG tablet Take 75 mg by mouth daily.    Historical Provider, MD  triamterene-hydrochlorothiazide (MAXZIDE) 75-50 MG per tablet Take 1 tablet by mouth daily. Patient takes 1/2 tablet daily.    Historical Provider, MD   Meds Ordered and Administered this Visit  Medications - No data to display  BP 133/69 (BP Location: Left Arm)   Pulse 77   Temp 98.8 F (37.1 C) (Oral)   Resp 12   LMP 07/22/2010   SpO2 98%  No data found.   Physical Exam  Constitutional: She appears well-developed and  well-nourished.  Musculoskeletal: Normal range of motion. She exhibits edema and tenderness. She exhibits no deformity.  Left wrist without deformity, mild swelling along the radial aspect, full ROM and normal strength. Left TKR without swelling, full ROM with mild crepitus.   Skin: Skin is warm.  Psychiatric: Her behavior is normal.  Nursing note and vitals reviewed.   Urgent Care Course   Clinical Course    Procedures (including critical care time)  Labs Review Labs Reviewed - No data to display  Imaging Review Dg Wrist Complete Right  Result Date: 02/28/2016 CLINICAL DATA:  [place] today and injured right wrist. EXAM: RIGHT WRIST - COMPLETE 3+ VIEW COMPARISON:  None. FINDINGS: The joint spaces are maintained.  No acute fracture is identified. IMPRESSION: No acute fracture. Electronically Signed   By: Marijo Sanes M.D.   On: 02/28/2016 18:32   Dg Knee Complete 4 Views Left  Result Date: 02/28/2016 CLINICAL DATA:  [place] today and injured knee. EXAM: LEFT KNEE - COMPLETE 4+ VIEW COMPARISON:  None. FINDINGS: The femoral and tibial components are well seated. No complicating features. No periprosthetic fracture. No joint effusion. IMPRESSION: No acute bony findings. Electronically Signed   By: Marijo Sanes M.D.   On: 02/28/2016 18:39     Visual Acuity Review  Right Eye Distance:   Left Eye Distance:   Bilateral Distance:    Right Eye Near:   Left Eye Near:    Bilateral Near:         MDM   1. Wrist sprain, right, initial encounter   2. Knee contusion, left, initial encounter   3. Fall, initial encounter    Treat conservatively with wrist splint, ice, elevation and rest. tylenol as needed.  F/U with her orthopedist if worsens.     Bjorn Pippin, PA-C 02/28/16 1900

## 2016-04-14 ENCOUNTER — Telehealth: Payer: Self-pay | Admitting: Cardiovascular Disease

## 2016-04-14 DIAGNOSIS — E7849 Other hyperlipidemia: Secondary | ICD-10-CM

## 2016-04-14 DIAGNOSIS — I10 Essential (primary) hypertension: Secondary | ICD-10-CM

## 2016-04-14 DIAGNOSIS — Z79899 Other long term (current) drug therapy: Secondary | ICD-10-CM

## 2016-04-14 NOTE — Telephone Encounter (Signed)
New message      Pt is scheduled to see Dr Acie Fredrickson 06-16-16.  Will she need labs drawn prior to appt?  Ok to leave msg on vm and also let pt know if she needs to be fasting for labs

## 2016-04-14 NOTE — Telephone Encounter (Signed)
Will forward pts request to have labs done prior to her OV, to Dr Acie Fredrickson and RN to review and advise on.  Will follow-up with pt accordingly, after recommendations provided.

## 2016-04-15 NOTE — Telephone Encounter (Signed)
Lets get a CMP and lipid profile Thanks

## 2016-04-15 NOTE — Telephone Encounter (Signed)
Patient will come in a day before her appointment to have lab work done.

## 2016-04-22 ENCOUNTER — Encounter: Payer: Self-pay | Admitting: Certified Nurse Midwife

## 2016-04-22 ENCOUNTER — Ambulatory Visit (INDEPENDENT_AMBULATORY_CARE_PROVIDER_SITE_OTHER): Payer: Medicare Other | Admitting: Certified Nurse Midwife

## 2016-04-22 VITALS — BP 124/88 | HR 72 | Resp 16 | Ht 67.75 in | Wt 296.0 lb

## 2016-04-22 DIAGNOSIS — Z01419 Encounter for gynecological examination (general) (routine) without abnormal findings: Secondary | ICD-10-CM

## 2016-04-22 NOTE — Patient Instructions (Signed)

## 2016-04-22 NOTE — Progress Notes (Signed)
70 y.o. [id number] Married  Caucasian Fe here for annual exam. Menopausal no HRT. Crocheting for craft shows and has injured thumb and wrist with fall. Healing now. Denies vaginal bleeding or vaginal dryness.Sees Dr. Arelia Sneddon PCP for hypertension, glucose management and cholesterol management/labs. Sees Cardiology for Atrial Fib, all stable. Busy also with 4 grandchildren! No health issues today.  Patient's last menstrual period was 07/22/2010.          Sexually active: Yes.    The current method of family planning is status post hysterectomy.    Exercising: Yes.    bike, house & yardwork Smoker:  no  Health Maintenance: Pap:  01-02-10 rare ASCUS H colpo done 2011 neg MMG:  11-07-15 category b density birads 1:neg Colonoscopy: 2016 f/u 14yrs BMD:   2013 TDaP:  2008 will do with PCP Shingles: 2008 Pneumonia: 2017 Hep C and HIV: Hep c done neg Labs: cardiologist Self breast exam: done occ  reports that she has never smoked. She has never used smokeless tobacco. She reports that she does not drink alcohol or use drugs.  Past Medical History:  Diagnosis Date  . Abnormal Pap smear    Rare ASCUS-H  . Arthritis   . Atrial fibrillation ([place])   . Chest pain   . Diabetes mellitus without complication ([place])   . Hypercholesteremia   . Hypertension    per pt no htn but needs meds for other reason  . Obesity     Past Surgical History:  Procedure Laterality Date  . CARDIAC CATHETERIZATION  07/11/2010   Est. EF of 60-65% -- Smooth and normal coronary arteries  -- Normal LV systolic function   . CHOLECYSTECTOMY    . CYSTOCELE REPAIR  07/22/2010   Vault suspension, cystocele repair, graft and cystoscopy  . FINGER SURGERY    . NASAL SINUS SURGERY    . NEUROMA SURGERY     rt foot  . TONSILLECTOMY    . TOTAL KNEE ARTHROPLASTY  06/20/2009   left knee  . TOTAL KNEE ARTHROPLASTY  12/27/2008   right knee  . TOTAL VAGINAL HYSTERECTOMY  07/22/2010   with bilateral salpingo-oophorectomy by Dr. Joan Flores     Current Outpatient Prescriptions  Medication Sig Dispense Refill  . apixaban (ELIQUIS) 5 MG TABS tablet Take 1 tablet (5 mg total) by mouth 2 (two) times daily. 60 tablet 11  . Ascorbic Acid (VITAMIN C) 1000 MG tablet Take 1,000 mg by mouth daily.    Marland Kitchen BIOTIN PO Take by mouth daily.    . Calcium Carbonate-Vit D-Min (GNP CALCIUM PLUS 600 +D PO) Take 1 tablet by mouth daily.     Marland Kitchen diltiazem (CARDIZEM CD) 240 MG 24 hr capsule TAKE 1 CAPSULE BY MOUTH DAILY. 90 capsule 2  . Docusate Calcium (STOOL SOFTENER PO) Take by mouth.    . Flaxseed, Linseed, (FLAX SEED OIL) 1000 MG CAPS Take 1,000 mg by mouth daily.     . multivitamin (THERAGRAN) per tablet Take 1 tablet by mouth daily.      . Omega-3 Fatty Acids (FISH OIL PO) Take 1 capsule by mouth daily. 1200 mg daily    . potassium chloride (K-DUR) 10 MEQ tablet Take 1 tablet (10 mEq total) by mouth daily. 30 tablet 11  . propranolol (INDERAL) 10 MG tablet TAKE 1 TABLET BY MOUTH AS NEEDED. (Patient taking differently: TAKE 1 TABLET BY MOUTH AS NEEDED FOR PAPITATIONS) 30 tablet 5  . pyridOXINE (VITAMIN B-6) 100 MG tablet Take 100 mg by mouth 2 (  two) times daily.    . ranitidine (ZANTAC) 150 MG tablet Take 75 mg by mouth daily.    Marland Kitchen triamterene-hydrochlorothiazide (MAXZIDE) 75-50 MG per tablet Take 1 tablet by mouth daily. Patient takes 1/2 tablet daily.    Marland Kitchen amoxicillin (AMOXIL) 500 MG capsule 1,000 mg. Take 2000mg   prior dental procedures  0   No current facility-administered medications for this visit.     Family History  Problem Relation Age of Onset  . Emphysema Father   . Atrial fibrillation Mother     has pacemaker  . Diabetes Mother   . Hypertension Mother   . Other Brother     bulbar palsy    ROS:  Pertinent items are noted in HPI.  Otherwise, a comprehensive ROS was negative.  Exam:   BP 124/88   Pulse 72   Resp 16   Ht 5' 7.75" (1.721 m)   Wt 296 lb (134.3 kg)   LMP 07/22/2010   BMI 45.34 kg/m  Height: 5' 7.75" (172.1  cm) Ht Readings from Last 3 Encounters:  04/22/16 5' 7.75" (1.721 m)  12/19/15 5' 7.75" (1.721 m)  07/02/15 5' 7.75" (1.721 m)    General appearance: alert, cooperative and appears stated age Head: Normocephalic, without obvious abnormality, atraumatic Neck: no adenopathy, supple, symmetrical, trachea midline and thyroid normal to inspection and palpation Lungs: clear to auscultation bilaterally Breasts: normal appearance, no masses or tenderness, No nipple retraction or dimpling, No nipple discharge or bleeding, No axillary or supraclavicular adenopathy Heart: regular rate and rhythm Abdomen: soft, non-tender; no masses,  no organomegaly Extremities: extremities normal, atraumatic, no cyanosis or edema Skin: Skin color, texture, turgor normal. No rashes or lesions Lymph nodes: Cervical, supraclavicular, and axillary nodes normal. No abnormal inguinal nodes palpated Neurologic: Grossly normal   Pelvic: External genitalia:  no lesions              Urethra:  normal appearing urethra with no masses, tenderness or lesions              Bartholin's and Skene's: normal                 Vagina: normal appearing vagina with normal color and discharge, no lesions              Cervix: absent              Pap taken: No. Bimanual Exam:  Uterus:  uterus absent              Adnexa: no mass, fullness, tenderness and adnexa surgically absent               Rectovaginal: Confirms               Anus:  normal sphincter tone, no lesions  Chaperone present: yes  A:  Well Woman with normal exam  Menopausal no HRT S/P TVH with BSO  History of Ascus H last pap in 2011  Sees MD for hypertension,hypercholesterol, Diabetes and Atrial Fib    P:   Reviewed health and wellness pertinent to exam  Discussed if vaginal dryness occurs to advise.  Continue follow up with PCP as indicated.  Pap smear as above not taken   counseled on breast self exam, mammography screening, feminine hygiene, adequate intake of  calcium and vitamin D, diet and exercise  return annually or prn  An After Visit Summary was printed and given to the patient.

## 2016-04-28 NOTE — Progress Notes (Signed)
Encounter reviewed Sayvon Arterberry, MD   

## 2016-06-15 ENCOUNTER — Other Ambulatory Visit: Payer: Medicare Other | Admitting: *Deleted

## 2016-06-15 DIAGNOSIS — Z79899 Other long term (current) drug therapy: Secondary | ICD-10-CM

## 2016-06-15 DIAGNOSIS — I10 Essential (primary) hypertension: Secondary | ICD-10-CM

## 2016-06-15 DIAGNOSIS — E7849 Other hyperlipidemia: Secondary | ICD-10-CM

## 2016-06-15 NOTE — Addendum Note (Signed)
Addended by: Eulis Foster on: 06/15/2016 10:15 AM   Modules accepted: Orders

## 2016-06-15 NOTE — Addendum Note (Signed)
Addended by: Eulis Foster on: 06/15/2016 10:14 AM   Modules accepted: Orders

## 2016-06-16 ENCOUNTER — Ambulatory Visit: Payer: Medicare Other | Admitting: Cardiovascular Disease

## 2016-06-16 ENCOUNTER — Other Ambulatory Visit: Payer: Self-pay | Admitting: Cardiovascular Disease

## 2016-06-16 LAB — LIPID PANEL
CHOL/HDL RATIO: 3.9 ratio (ref 0.0–4.4)
Cholesterol, Total: 182 mg/dL (ref 100–199)
HDL: 47 mg/dL (ref >39)
LDL Calculated: 113 mg/dL — ABNORMAL HIGH (ref 0–99)
Triglycerides: 108 mg/dL (ref 0–149)
VLDL Cholesterol Cal: 22 mg/dL (ref 5–40)

## 2016-06-16 LAB — COMPREHENSIVE METABOLIC PANEL
A/G RATIO: 1.7 (ref 1.2–2.2)
ALT: 22 IU/L (ref 0–32)
AST: 18 IU/L (ref 0–40)
Albumin: 3.8 g/dL (ref 3.6–4.8)
Alkaline Phosphatase: 84 IU/L (ref 39–117)
BILIRUBIN TOTAL: 0.6 mg/dL (ref 0.0–1.2)
BUN / CREAT RATIO: 28 (ref 12–28)
BUN: 19 mg/dL (ref 8–27)
CO2: 28 mmol/L (ref 18–29)
Calcium: 9.2 mg/dL (ref 8.7–10.3)
Chloride: 102 mmol/L (ref 96–106)
Creatinine, Ser: 0.69 mg/dL (ref 0.57–1.00)
GFR, EST AFRICAN AMERICAN: 103 mL/min/{1.73_m2} (ref >59)
GFR, EST NON AFRICAN AMERICAN: 89 mL/min/{1.73_m2} (ref >59)
GLOBULIN, TOTAL: 2.3 g/dL (ref 1.5–4.5)
Glucose: 107 mg/dL — ABNORMAL HIGH (ref 65–99)
POTASSIUM: 3.6 mmol/L (ref 3.5–5.2)
SODIUM: 143 mmol/L (ref 134–144)
TOTAL PROTEIN: 6.1 g/dL (ref 6.0–8.5)

## 2016-06-30 ENCOUNTER — Encounter: Payer: Self-pay | Admitting: Cardiovascular Disease

## 2016-07-06 ENCOUNTER — Ambulatory Visit (INDEPENDENT_AMBULATORY_CARE_PROVIDER_SITE_OTHER): Payer: Medicare Other | Admitting: Cardiovascular Disease

## 2016-07-06 ENCOUNTER — Encounter: Payer: Self-pay | Admitting: Cardiovascular Disease

## 2016-07-06 VITALS — BP 140/88 | HR 68 | Ht 67.75 in | Wt 291.0 lb

## 2016-07-06 DIAGNOSIS — I480 Paroxysmal atrial fibrillation: Secondary | ICD-10-CM

## 2016-07-06 DIAGNOSIS — I10 Essential (primary) hypertension: Secondary | ICD-10-CM | POA: Diagnosis not present

## 2016-07-06 NOTE — Progress Notes (Signed)
Cardiology Office Note   Date:  07/06/2016   ID:  [name], DOB 14-Jun-1945, MRN [id number]  PCP:  Robin Downing, MD  Cardiologist:   Robin Moores, MD   Chief Complaint  Patient presents with  . Follow-up    PAF       History of Present Illness: [name] is a 71 y.o. female who presents for follow up of her paroxysmal atrial fib  1. Hypertension 2. History of atrial fibrillation 3. Hyperlipidemia   History of Present Illness:  [name] is doing well. She has decreased her dose of Maxzide and her BP has been elevated. She has not been exercising on a regular basis but has been trying to exercise more since New's Years Day. She has lost 6 pounds. She has not had any chest pain or dyspnea.   She did feel a little bit of extra salt recently.   January 02, 2013:  [name] has done well. She had some various aches and pains (chest , back) in Feb. The pains improved with omeprazol but it caused excessive gas and bloating. Her lipids have been minimally elevated.   07/05/2013:  [name] is doing well. She has a sinus infection recently.   January 01, 2014:  [name] is doing well. She has had some sinus infections.   Jan. 25, 2016:  [name] is doing well. No cardiac issues.  Has had a few palpitations - heart racing.  But her HR would be normal if she takes her pulse.  Rides her stationary bike 30 mninutes a day.   Jan. 24, 2017:  [name] is seen for follow up of her atrial fib.  CHADS2VASC = 3 ( female, HTN, AGE > 37)  She has PAF - was in NSR at her last visit. Is back in A-Fib today .  Asymptomatic.  No CP or dyspnea  Has occasional right sided chest pain .   Has right shoulder pain and is seeing Dr. Onnie Graham soon Had a Myoivew in 2012 - ( inf. Defect) .  Subsequent cath showed normal coronary arteries.   December 19, 2015: [name] is back in NSR today  Has PAF , has never needed cardioversion. Takes propranolol as needed. Has some dizziness /  orthostasis  Some of her dizziness is not related to orthostasis   Jan. 29, 2018:  [name] is seen today .    Has been having problems with her hemmorhoids.   Asked about watchman  I have suggested that she get her hemorrhoids fixed and we resume anticoagulation shortly thereafter.  She has occasional episodes of brief presyncope. Typically occur if she sitting down. Has never had complete syncope   Past Medical History:  Diagnosis Date  . Abnormal Pap smear    Rare ASCUS-H  . Arthritis   . Atrial fibrillation ([place])   . Chest pain   . Diabetes mellitus without complication ([place])   . Hypercholesteremia   . Hypertension    per pt no htn but needs meds for other reason  . Obesity     Past Surgical History:  Procedure Laterality Date  . CARDIAC CATHETERIZATION  07/11/2010   Est. EF of 60-65% -- Smooth and normal coronary arteries  -- Normal LV systolic function   . CHOLECYSTECTOMY    . CYSTOCELE REPAIR  07/22/2010   Vault suspension, cystocele repair, graft and cystoscopy  . FINGER SURGERY    . NASAL SINUS SURGERY    . NEUROMA SURGERY     rt foot  .  TONSILLECTOMY    . TOTAL KNEE ARTHROPLASTY  06/20/2009   left knee  . TOTAL KNEE ARTHROPLASTY  12/27/2008   right knee  . TOTAL VAGINAL HYSTERECTOMY  07/22/2010   with bilateral salpingo-oophorectomy by Dr. Joan Flores     Current Outpatient Prescriptions  Medication Sig Dispense Refill  . ACCU-CHEK AVIVA PLUS test strip   0  . amoxicillin (AMOXIL) 500 MG capsule 1,000 mg. Take 2000mg   prior dental procedures  0  . apixaban (ELIQUIS) 5 MG TABS tablet Take 1 tablet (5 mg total) by mouth 2 (two) times daily. 60 tablet 11  . Ascorbic Acid (VITAMIN C) 1000 MG tablet Take 1,000 mg by mouth daily.    Marland Kitchen BIOTIN PO Take by mouth daily.    . Calcium Carbonate-Vit D-Min (GNP CALCIUM PLUS 600 +D PO) Take 1 tablet by mouth daily.     Marland Kitchen diltiazem (CARDIZEM CD) 240 MG 24 hr capsule TAKE 1 CAPSULE BY MOUTH DAILY. 90 capsule 3  . Docusate  Calcium (STOOL SOFTENER PO) Take by mouth.    . Flaxseed, Linseed, (FLAX SEED OIL) 1000 MG CAPS Take 1,000 mg by mouth daily.     . multivitamin (THERAGRAN) per tablet Take 1 tablet by mouth daily.      . Omega-3 Fatty Acids (FISH OIL PO) Take 1 capsule by mouth daily. 1200 mg daily    . PATADAY 0.2 % SOLN   4  . potassium chloride (K-DUR) 10 MEQ tablet Take 1 tablet (10 mEq total) by mouth daily. 30 tablet 11  . propranolol (INDERAL) 10 MG tablet Take 10 mg by mouth daily as needed.    . pyridOXINE (VITAMIN B-6) 100 MG tablet Take 100 mg by mouth 2 (two) times daily.    . ranitidine (ZANTAC) 150 MG tablet Take 75 mg by mouth daily.    Marland Kitchen triamterene-hydrochlorothiazide (MAXZIDE) 75-50 MG per tablet Take 1 tablet by mouth daily. Patient takes 1/2 tablet daily.     No current facility-administered medications for this visit.     Allergies:   Erythromycin; Plavix [clopidogrel bisulfate]; and Sulfonamide derivatives    Social History:  The patient  reports that she has never smoked. She has never used smokeless tobacco. She reports that she does not drink alcohol or use drugs.   Family History:  The patient's family history includes Atrial fibrillation in her mother; Diabetes in her mother; Emphysema in her father; Hypertension in her mother; Other in her brother.    ROS:  Please see the history of present illness.   Otherwise, review of systems are positive for none.   All other systems are reviewed and negative.    PHYSICAL EXAM: VS:  BP 140/88 (BP Location: Left Arm, Patient Position: Sitting, Cuff Size: Large)   Pulse 68   Ht 5' 7.75" (1.721 m)   Wt 291 lb (132 kg)   LMP 07/22/2010   BMI 44.57 kg/m  , BMI Body mass index is 44.57 kg/m. GEN: Well nourished, well developed, in no acute distress  HEENT: normal  Neck: no JVD, carotid bruits, or masses Cardiac: RRR; no murmurs, rubs, or gallops, mild leg  edema  Respiratory:  clear to auscultation bilaterally, normal work of  breathing GI: soft, nontender, nondistended, + BS MS: no deformity or atrophy , Small dark spot on right lower leg   Skin: warm and dry, no rash Neuro:  Strength and sensation are intact Psych: normal  EKG:  EKG is ordered today.   Recent Labs: 12/16/2015: Hemoglobin  14.2; Platelets 233 06/15/2016: ALT 22; BUN 19; Creatinine, Ser 0.69; Potassium 3.6; Sodium 143    Lipid Panel    Component Value Date/Time   CHOL 182 06/15/2016 1015   TRIG 108 06/15/2016 1015   HDL 47 06/15/2016 1015   CHOLHDL 3.9 06/15/2016 1015   CHOLHDL 3.4 07/02/2015 0737   VLDL 16 07/02/2015 0737   LDLCALC 113 (H) 06/15/2016 1015      Wt Readings from Last 3 Encounters:  07/06/16 291 lb (132 kg)  04/22/16 296 lb (134.3 kg)  12/19/15 289 lb 6.4 oz (131.3 kg)      Other studies Reviewed: Additional studies/ records that were reviewed today include: . Review of the above records demonstrates:    ASSESSMENT AND PLAN:  1.  Paroxysmal atrial fib:  She's doing very well.   On Eliquis 5 BID. Refill Propranolol  Will see her in 6 months, check BMP    2. Hypertension: Her blood pressure is well-controlled. Continue current medications.  3. Hyperlipidemia: Continue current medications.  Lipids several weeks ago look ok.  4. Obesity:   Advised watching her diet  Current medicines are reviewed at length with the patient today.  The patient does not have concerns regarding medicines.  The following changes have been made:  no change  Labs/ tests ordered today include:  No orders of the defined types were placed in this encounter.   Disposition:   FU with me in 6 months   Signed, Robin Moores, MD  07/06/2016 3:12 PM    [facility] [place], [place], [place]  [address] Phone: [phone]; Fax: [phone]

## 2016-07-06 NOTE — Patient Instructions (Signed)
Medication Instructions:  Your physician recommends that you continue on your current medications as directed. Please refer to the Current Medication list given to you today.   Labwork: Your physician recommends that you return for lab work in: 6 months at your next office visit for basic metabolic panel   Testing/Procedures: None Ordered   Follow-Up: Your physician wants you to follow-up in: 6 months with Dr. Acie Fredrickson.  You will receive a reminder letter in the mail two months in advance. If you don't receive a letter, please call our office to schedule the follow-up appointment.   If you need a refill on your cardiac medications before your next appointment, please call your pharmacy.   Thank you for choosing [facility]! Christen Bame, RN [phone]

## 2016-07-25 ENCOUNTER — Other Ambulatory Visit: Payer: Self-pay | Admitting: Cardiovascular Disease

## 2016-12-24 ENCOUNTER — Other Ambulatory Visit: Payer: Self-pay | Admitting: Cardiovascular Disease

## 2017-01-20 ENCOUNTER — Encounter: Payer: Self-pay | Admitting: Cardiovascular Disease

## 2017-02-10 ENCOUNTER — Other Ambulatory Visit: Payer: Medicare Other | Admitting: *Deleted

## 2017-02-10 DIAGNOSIS — I480 Paroxysmal atrial fibrillation: Secondary | ICD-10-CM

## 2017-02-10 DIAGNOSIS — I10 Essential (primary) hypertension: Secondary | ICD-10-CM

## 2017-02-10 LAB — BASIC METABOLIC PANEL
BUN/Creatinine Ratio: 26 (ref 12–28)
BUN: 20 mg/dL (ref 8–27)
CALCIUM: 9.4 mg/dL (ref 8.7–10.3)
CHLORIDE: 101 mmol/L (ref 96–106)
CO2: 26 mmol/L (ref 20–29)
CREATININE: 0.78 mg/dL (ref 0.57–1.00)
GFR calc Af Amer: 89 mL/min/{1.73_m2} (ref >59)
GFR calc non Af Amer: 77 mL/min/{1.73_m2} (ref >59)
Glucose: 113 mg/dL — ABNORMAL HIGH (ref 65–99)
POTASSIUM: 3.9 mmol/L (ref 3.5–5.2)
SODIUM: 143 mmol/L (ref 134–144)

## 2017-02-11 ENCOUNTER — Other Ambulatory Visit: Payer: Medicare Other

## 2017-02-12 ENCOUNTER — Encounter: Payer: Self-pay | Admitting: Cardiovascular Disease

## 2017-02-12 ENCOUNTER — Ambulatory Visit (INDEPENDENT_AMBULATORY_CARE_PROVIDER_SITE_OTHER): Payer: Medicare Other | Admitting: Cardiovascular Disease

## 2017-02-12 VITALS — BP 128/80 | HR 69 | Ht 68.0 in | Wt 297.4 lb

## 2017-02-12 DIAGNOSIS — E782 Mixed hyperlipidemia: Secondary | ICD-10-CM | POA: Diagnosis not present

## 2017-02-12 DIAGNOSIS — I480 Paroxysmal atrial fibrillation: Secondary | ICD-10-CM

## 2017-02-12 DIAGNOSIS — I10 Essential (primary) hypertension: Secondary | ICD-10-CM | POA: Diagnosis not present

## 2017-02-12 MED ORDER — POTASSIUM CHLORIDE ER 10 MEQ PO TBCR: 10.0000 meq | EXTENDED_RELEASE_TABLET | Freq: Every day | ORAL | 3 refills | Status: DC

## 2017-02-12 NOTE — Progress Notes (Signed)
Cardiology Office Note   Date:  02/12/2017   ID:  [name], DOB Oct 18, 1945, MRN [id number]  PCP:  Leonard Downing, MD  Cardiologist:   Mertie Moores, MD   Chief Complaint  Patient presents with  . Follow-up    essential hypertension      History of Present Illness: [name] is a 71 y.o. female who presents for follow up of her paroxysmal atrial fib  1. Hypertension 2. History of atrial fibrillation 3. Hyperlipidemia   History of Present Illness:  [name] is doing well. She has decreased her dose of Maxzide and her BP has been elevated. She has not been exercising on a regular basis but has been trying to exercise more since New's Years Day. She has lost 6 pounds. She has not had any chest pain or dyspnea.   She did feel a little bit of extra salt recently.   January 02, 2013:  [name] has done well. She had some various aches and pains (chest , back) in Feb. The pains improved with omeprazol but it caused excessive gas and bloating. Her lipids have been minimally elevated.   07/05/2013:  [name] is doing well. She has a sinus infection recently.   January 01, 2014:  [name] is doing well. She has had some sinus infections.   Jan. 25, 2016:  [name] is doing well. No cardiac issues.  Has had a few palpitations - heart racing.  But her HR would be normal if she takes her pulse.  Rides her stationary bike 30 mninutes a day.   Jan. 24, 2017:  [name] is seen for follow up of her atrial fib.  CHADS2VASC = 3 ( female, HTN, AGE > 78)  She has PAF - was in NSR at her last visit. Is back in A-Fib today .  Asymptomatic.  No CP or dyspnea  Has occasional right sided chest pain .   Has right shoulder pain and is seeing Dr. Onnie Graham soon Had a Myoivew in 2012 - ( inf. Defect) .  Subsequent cath showed normal coronary arteries.   December 19, 2015: [name] is back in NSR today  Has PAF , has never needed cardioversion. Takes propranolol as needed. Has  some dizziness / orthostasis  Some of her dizziness is not related to orthostasis   Jan. 29, 2018:  [name] is seen today .    Has been having problems with her hemmorhoids.   Asked about watchman  I have suggested that she get her hemorrhoids fixed and we resume anticoagulation shortly thereafter.  She has occasional episodes of brief presyncope. Typically occur if she sitting down. Has never had complete syncope   Sept. 7 , 2018:  Has had several episodes of lightneadedness. Typically when she just stands up.   Also can have lightheadedness when she turns her head Has not had true syncope Has run out of potassium 2 weeks ago   Past Medical History:  Diagnosis Date  . Abnormal Pap smear    Rare ASCUS-H  . Arthritis   . Atrial fibrillation ([place])   . Chest pain   . Diabetes mellitus without complication ([place])   . Hypercholesteremia   . Hypertension    per pt no htn but needs meds for other reason  . Obesity     Past Surgical History:  Procedure Laterality Date  . CARDIAC CATHETERIZATION  07/11/2010   Est. EF of 60-65% -- Smooth and normal coronary arteries  -- Normal LV systolic function   .  CHOLECYSTECTOMY    . CYSTOCELE REPAIR  07/22/2010   Vault suspension, cystocele repair, graft and cystoscopy  . FINGER SURGERY    . NASAL SINUS SURGERY    . NEUROMA SURGERY     rt foot  . TONSILLECTOMY    . TOTAL KNEE ARTHROPLASTY  06/20/2009   left knee  . TOTAL KNEE ARTHROPLASTY  12/27/2008   right knee  . TOTAL VAGINAL HYSTERECTOMY  07/22/2010   with bilateral salpingo-oophorectomy by Dr. Joan Flores     Current Outpatient Prescriptions  Medication Sig Dispense Refill  . ACCU-CHEK AVIVA PLUS test strip   0  . amoxicillin (AMOXIL) 500 MG capsule 1,000 mg. Take 2000mg   prior dental procedures  0  . Ascorbic Acid (VITAMIN C) 1000 MG tablet Take 1,000 mg by mouth daily.    [name] BIOTIN PO Take 1 capsule by mouth daily.     . Calcium Carbonate-Vit D-Min (GNP CALCIUM PLUS 600 +D  PO) Take 1 tablet by mouth daily.     [name] diltiazem (CARDIZEM CD) 240 MG 24 hr capsule TAKE 1 CAPSULE BY MOUTH DAILY. 90 capsule 3  . Docusate Calcium (STOOL SOFTENER PO) Take 1 capsule by mouth daily.     [name] ELIQUIS 5 MG TABS tablet TAKE 1 TABLET BY MOUTH 2 TIMES DAILY. 60 tablet 5  . Flaxseed, Linseed, (FLAX SEED OIL) 1000 MG CAPS Take 1,000 mg by mouth daily.     [name] loratadine (CLARITIN) 10 MG tablet Take 10 mg by mouth daily.    . mometasone (NASONEX) 50 MCG/ACT nasal spray Place 1 spray into the nose daily as needed for congestion.  0  . multivitamin (THERAGRAN) per tablet Take 1 tablet by mouth daily.      . Omega-3 Fatty Acids (FISH OIL PO) Take 1 capsule by mouth daily. 1200 mg daily    . PATADAY 0.2 % SOLN Place 1 drop into both eyes daily.   4  . potassium chloride (K-DUR) 10 MEQ tablet Take 1 tablet (10 mEq total) by mouth daily. Please keep 02/12/17 Appt for further refills 30 tablet 0  . propranolol (INDERAL) 10 MG tablet Take 10 mg by mouth daily as needed.    . pyridOXINE (VITAMIN B-6) 100 MG tablet Take 100 mg by mouth 2 (two) times daily.    . ranitidine (ZANTAC) 150 MG tablet Take 75 mg by mouth daily.    [name] triamterene-hydrochlorothiazide (MAXZIDE) 75-50 MG per tablet Take 1 tablet by mouth daily. Patient takes 1/2 tablet daily.     No current facility-administered medications for this visit.     Allergies:   Erythromycin; Plavix [clopidogrel bisulfate]; and Sulfonamide derivatives    Social History:  The patient  reports that she has never smoked. She has never used smokeless tobacco. She reports that she does not drink alcohol or use drugs.   Family History:  The patient's family history includes Atrial fibrillation in her mother; Diabetes in her mother; Emphysema in her father; Hypertension in her mother; Other in her brother.    ROS:  Please see the history of present illness.   Otherwise, review of systems are positive for none.   All other systems are reviewed and  negative.    PHYSICAL EXAM: VS:  BP 128/80   Pulse 69   Ht 5\' 8"  (1.727 m)   Wt 297 lb 6.4 oz (134.9 kg)   LMP 07/22/2010   BMI 45.22 kg/m  , BMI Body mass index is 45.22 kg/m. GEN: Well nourished, well  developed, in no acute distress  HEENT: normal  Neck: no JVD, carotid bruits, or masses Cardiac: RR; no murmurs, rubs, or gallops, 1+ leg  edema  Respiratory:  clear to auscultation bilaterally, normal work of breathing GI: soft, nontender, nondistended, + BS MS: no deformity or atrophy ,  Chronic stasis changes Small dark spot on right lower leg   Skin: warm and dry, no rash Neuro:  Strength and sensation are intact Psych: normal  EKG:  EKG is ordered today. Sept. 7, 2018:   NSR at 67.   Normal ECG   Recent Labs: 06/15/2016: ALT 22 02/10/2017: BUN 20; Creatinine, Ser 0.78; Potassium 3.9; Sodium 143    Lipid Panel    Component Value Date/Time   CHOL 182 06/15/2016 1015   TRIG 108 06/15/2016 1015   HDL 47 06/15/2016 1015   CHOLHDL 3.9 06/15/2016 1015   CHOLHDL 3.4 07/02/2015 0737   VLDL 16 07/02/2015 0737   LDLCALC 113 (H) 06/15/2016 1015      Wt Readings from Last 3 Encounters:  02/12/17 297 lb 6.4 oz (134.9 kg)  07/06/16 291 lb (132 kg)  04/22/16 296 lb (134.3 kg)      Other studies Reviewed: Additional studies/ records that were reviewed today include: . Review of the above records demonstrates:    ASSESSMENT AND PLAN:  1.  Paroxysmal atrial fib:  She's doing very well.   On Eliquis 5 BID.   Will see her in 6 months, check BMP    2. Hypertension: Her blood pressure is well-controlled. Continue current medications.  3. Hyperlipidemia: Continue current medications.  Her lipids are mildly elevated Encourage diet, exercise and weight loss.  4. Obesity:   Advised watching her diet  Current medicines are reviewed at length with the patient today.  The patient does not have concerns regarding medicines.  The following changes have been made:  no  change  Labs/ tests ordered today include:  No orders of the defined types were placed in this encounter.  Disposition:   FU with me in 6 months   Signed, Mertie Moores, MD  02/12/2017 8:15 AM    [facility] [place], [place], [place]  [address] Phone: [phone]; Fax: [phone]

## 2017-02-12 NOTE — Patient Instructions (Signed)

## 2017-02-24 ENCOUNTER — Other Ambulatory Visit: Payer: Self-pay | Admitting: Cardiovascular Disease

## 2017-02-24 NOTE — Telephone Encounter (Signed)
Eliquis 5mg  refill received; pt is 71 yrs old, wt-134.9kg, Crea-0.78 on 02/10/17, last seen by Dr. Acie Fredrickson on 02/12/17. Will send in refill request tro requested pharmacy.

## 2017-05-12 ENCOUNTER — Ambulatory Visit (INDEPENDENT_AMBULATORY_CARE_PROVIDER_SITE_OTHER): Payer: Medicare Other | Admitting: Certified Nurse Midwife

## 2017-05-12 ENCOUNTER — Other Ambulatory Visit: Payer: Self-pay

## 2017-05-12 ENCOUNTER — Encounter: Payer: Self-pay | Admitting: Certified Nurse Midwife

## 2017-05-12 VITALS — BP 126/80 | HR 70 | Resp 16 | Ht 67.75 in | Wt 299.0 lb

## 2017-05-12 DIAGNOSIS — Z01419 Encounter for gynecological examination (general) (routine) without abnormal findings: Secondary | ICD-10-CM

## 2017-05-12 DIAGNOSIS — N951 Menopausal and female climacteric states: Secondary | ICD-10-CM

## 2017-05-12 NOTE — Patient Instructions (Signed)

## 2017-05-12 NOTE — Progress Notes (Signed)
71 y.o. G64P2002 Married  Caucasian Fe here for annual exam. Menopausal no HRT. No vaginal dryness, or vaginal bleeding. Sees Dr.Elkins for hypertension, hyperlipidemia and  Diabetes management, labs and aex yearly. Sees cardiology every 6 months for Eliquis management. All stable so far, still having edema of feet and legs but wears compression hose, which helps. Still working with her own business with hand crafts. No health issues today.  Patient's last menstrual period was 07/22/2010.          Sexually active: Yes.    The current method of family planning is status post hysterectomy.    Exercising: Yes.    yardwork, bike, housework Smoker:  no  Health Maintenance: Pap:  01-02-10 rare ASCUS H colpo done 2011 neg History of Abnormal Pap: yes MMG:  11-07-15 category b density birads 1:neg, has scheduled Self Breast exams: occ Colonoscopy:  2016 f/u 74yrs BMD:   2013 will call and schedule TDaP:  6/18 Shingles: 2008 Pneumonia: 2017 Hep C and HIV: hep c neg per patient Labs: if needed   reports that  has never smoked. she has never used smokeless tobacco. She reports that she does not drink alcohol or use drugs.  Past Medical History:  Diagnosis Date  . Abnormal Pap smear    Rare ASCUS-H  . Arthritis   . Atrial fibrillation ([place])   . Chest pain   . Diabetes mellitus without complication ([place])   . Hypercholesteremia   . Hypertension    per pt no htn but needs meds for other reason  . Obesity     Past Surgical History:  Procedure Laterality Date  . CARDIAC CATHETERIZATION  07/11/2010   Est. EF of 60-65% -- Smooth and normal coronary arteries  -- Normal LV systolic function   . CHOLECYSTECTOMY    . CYSTOCELE REPAIR  07/22/2010   Vault suspension, cystocele repair, graft and cystoscopy  . FINGER SURGERY    . NASAL SINUS SURGERY    . NEUROMA SURGERY     rt foot  . TONSILLECTOMY    . TOTAL KNEE ARTHROPLASTY  06/20/2009   left knee  . TOTAL KNEE ARTHROPLASTY  12/27/2008    right knee  . TOTAL VAGINAL HYSTERECTOMY  07/22/2010   with bilateral salpingo-oophorectomy by Dr. Joan Flores    Current Outpatient Medications  Medication Sig Dispense Refill  . ACCU-CHEK AVIVA PLUS test strip   0  . Ascorbic Acid (VITAMIN C) 1000 MG tablet Take 1,000 mg by mouth daily.    Marland Kitchen BIOTIN PO Take 1 capsule by mouth daily.     . Calcium Carbonate-Vit D-Min (GNP CALCIUM PLUS 600 +D PO) Take 1 tablet by mouth daily.     Marland Kitchen diltiazem (CARDIZEM CD) 240 MG 24 hr capsule TAKE 1 CAPSULE BY MOUTH DAILY. 90 capsule 3  . Docusate Calcium (STOOL SOFTENER PO) Take 1 capsule by mouth daily.     Marland Kitchen ELIQUIS 5 MG TABS tablet TAKE 1 TABLET BY MOUTH 2 TIMES DAILY. 60 tablet 6  . Flaxseed, Linseed, (FLAX SEED OIL) 1000 MG CAPS Take 1,000 mg by mouth daily.     Marland Kitchen loratadine (CLARITIN) 10 MG tablet Take 10 mg by mouth daily.    . mometasone (NASONEX) 50 MCG/ACT nasal spray Place 1 spray into the nose daily as needed for congestion.  0  . multivitamin (THERAGRAN) per tablet Take 1 tablet by mouth daily.      . Omega-3 Fatty Acids (FISH OIL PO) Take 1 capsule by mouth daily.  1200 mg daily    . PATADAY 0.2 % SOLN Place 1 drop into both eyes daily.   4  . potassium chloride (K-DUR) 10 MEQ tablet Take 1 tablet (10 mEq total) by mouth daily. Please keep 02/12/17 Appt for further refills 90 tablet 3  . pyridOXINE (VITAMIN B-6) 100 MG tablet Take 100 mg by mouth 2 (two) times daily.    . ranitidine (ZANTAC) 150 MG tablet Take 75 mg by mouth daily.    Marland Kitchen triamterene-hydrochlorothiazide (MAXZIDE) 75-50 MG per tablet Take 1 tablet by mouth daily. Patient takes 1/2 tablet daily.    Marland Kitchen amoxicillin (AMOXIL) 500 MG capsule 1,000 mg. Take 2000mg   prior dental procedures  0  . propranolol (INDERAL) 10 MG tablet Take 10 mg by mouth daily as needed.     No current facility-administered medications for this visit.     Family History  Problem Relation Age of Onset  . Emphysema Father   . Atrial fibrillation Mother         has pacemaker  . Diabetes Mother   . Hypertension Mother   . Other Brother        bulbar palsy    ROS:  Pertinent items are noted in HPI.  Otherwise, a comprehensive ROS was negative.  Exam:   BP 126/80   Pulse 70   Resp 16   Ht 5' 7.75" (1.721 m)   Wt 299 lb (135.6 kg)   LMP 07/22/2010   BMI 45.80 kg/m  Height: 5' 7.75" (172.1 cm) Ht Readings from Last 3 Encounters:  05/12/17 5' 7.75" (1.721 m)  02/12/17 5\' 8"  (1.727 m)  07/06/16 5' 7.75" (1.721 m)    General appearance: alert, cooperative and appears stated age Head: Normocephalic, without obvious abnormality, atraumatic Neck: no adenopathy, supple, symmetrical, trachea midline and thyroid normal to inspection and palpation Lungs: clear to auscultation bilaterally Breasts: normal appearance, no masses or tenderness, No nipple retraction or dimpling, No nipple discharge or bleeding, No axillary or supraclavicular adenopathy Heart: regular rate and rhythm Abdomen: soft, non-tender; no masses,  no organomegaly Extremities: extremities normal, atraumatic, no cyanosis or edema Skin: Skin color, texture, turgor normal. No rashes or lesions Lymph nodes: Cervical, supraclavicular, and axillary nodes normal. No abnormal inguinal nodes palpated Neurologic: Grossly normal   Pelvic: External genitalia:  no lesions              Urethra:  normal appearing urethra with no masses, tenderness or lesions              Bartholin's and Skene's: normal                 Vagina: normal appearing vagina with normal color and discharge, no lesions              Cervix: absent              Pap taken: No. Bimanual Exam:  Uterus:  uterus absent              Adnexa: no mass, fullness, tenderness and adnexa bilateral surgically absent               Rectovaginal: Confirms               Anus:  normal sphincter tone, no lesions  Chaperone present: yes  A:  Well Woman with normal exam  Menopausal no HRT s/p TVH with BSO due to bleeding and  prolapse  Morbid obesity on weight loss  Hypertension/cholesterol/diabetes/atrial fib  with MD management  Mammogram and BMD due  P:   Reviewed health and wellness pertinent to exam  Discussed importance of weight loss and leg and feet health, in addition to overall health.  Continue MD management as indicated.  Patient will call and schedule  Pap smear: no  counseled on breast self exam, mammography screening, feminine hygiene, adequate intake of calcium and vitamin D, diet and exercise  return annually or prn  An After Visit Summary was printed and given to the patient.

## 2017-05-14 ENCOUNTER — Other Ambulatory Visit: Payer: Self-pay | Admitting: Certified Nurse Midwife

## 2017-05-14 DIAGNOSIS — Z1231 Encounter for screening mammogram for malignant neoplasm of breast: Secondary | ICD-10-CM

## 2017-05-17 ENCOUNTER — Inpatient Hospital Stay (HOSPITAL_COMMUNITY)
Admission: EM | Admit: 2017-05-17 | Discharge: 2017-05-22 | DRG: 253 | Disposition: A | Discharge status: Skilled Nursing Facility | Payer: Medicare Other | Attending: Internal Medicine | Admitting: Internal Medicine

## 2017-05-17 ENCOUNTER — Inpatient Hospital Stay (HOSPITAL_COMMUNITY): Payer: Medicare Other | Admitting: Certified Registered"

## 2017-05-17 ENCOUNTER — Emergency Department (HOSPITAL_COMMUNITY): Payer: Medicare Other

## 2017-05-17 ENCOUNTER — Encounter (HOSPITAL_COMMUNITY)
Admission: EM | Disposition: A | Discharge status: Skilled Nursing Facility | Payer: Self-pay | Source: Home / Self Care | Attending: Internal Medicine

## 2017-05-17 ENCOUNTER — Encounter (HOSPITAL_COMMUNITY): Payer: Self-pay | Admitting: Internal Medicine

## 2017-05-17 DIAGNOSIS — Z96653 Presence of artificial knee joint, bilateral: Secondary | ICD-10-CM | POA: Diagnosis present

## 2017-05-17 DIAGNOSIS — S85001A Unspecified injury of popliteal artery, right leg, initial encounter: Secondary | ICD-10-CM

## 2017-05-17 DIAGNOSIS — Y792 Prosthetic and other implants, materials and accessory orthopedic devices associated with adverse incidents: Secondary | ICD-10-CM | POA: Diagnosis present

## 2017-05-17 DIAGNOSIS — S83106A Unspecified dislocation of unspecified knee, initial encounter: Secondary | ICD-10-CM

## 2017-05-17 DIAGNOSIS — W000XXA Fall on same level due to ice and snow, initial encounter: Secondary | ICD-10-CM | POA: Diagnosis present

## 2017-05-17 DIAGNOSIS — I10 Essential (primary) hypertension: Secondary | ICD-10-CM | POA: Diagnosis present

## 2017-05-17 DIAGNOSIS — S85009A Unspecified injury of popliteal artery, unspecified leg, initial encounter: Secondary | ICD-10-CM

## 2017-05-17 DIAGNOSIS — S83104A Unspecified dislocation of right knee, initial encounter: Secondary | ICD-10-CM

## 2017-05-17 DIAGNOSIS — S80821A Blister (nonthermal), right lower leg, initial encounter: Secondary | ICD-10-CM | POA: Diagnosis present

## 2017-05-17 DIAGNOSIS — I480 Paroxysmal atrial fibrillation: Secondary | ICD-10-CM | POA: Diagnosis not present

## 2017-05-17 DIAGNOSIS — S85001D Unspecified injury of popliteal artery, right leg, subsequent encounter: Secondary | ICD-10-CM

## 2017-05-17 DIAGNOSIS — E669 Obesity, unspecified: Secondary | ICD-10-CM | POA: Diagnosis present

## 2017-05-17 DIAGNOSIS — Z7901 Long term (current) use of anticoagulants: Secondary | ICD-10-CM

## 2017-05-17 DIAGNOSIS — Z6841 Body Mass Index (BMI) 40.0 and over, adult: Secondary | ICD-10-CM | POA: Diagnosis not present

## 2017-05-17 DIAGNOSIS — I998 Other disorder of circulatory system: Secondary | ICD-10-CM | POA: Diagnosis not present

## 2017-05-17 DIAGNOSIS — I482 Chronic atrial fibrillation: Secondary | ICD-10-CM | POA: Diagnosis present

## 2017-05-17 DIAGNOSIS — K30 Functional dyspepsia: Secondary | ICD-10-CM | POA: Diagnosis not present

## 2017-05-17 DIAGNOSIS — E1165 Type 2 diabetes mellitus with hyperglycemia: Secondary | ICD-10-CM | POA: Diagnosis present

## 2017-05-17 DIAGNOSIS — D72829 Elevated white blood cell count, unspecified: Secondary | ICD-10-CM | POA: Diagnosis not present

## 2017-05-17 DIAGNOSIS — E7800 Pure hypercholesterolemia, unspecified: Secondary | ICD-10-CM | POA: Diagnosis present

## 2017-05-17 DIAGNOSIS — T84022A Instability of internal right knee prosthesis, initial encounter: Secondary | ICD-10-CM | POA: Diagnosis present

## 2017-05-17 DIAGNOSIS — I7777 Dissection of artery of lower extremity: Secondary | ICD-10-CM | POA: Diagnosis present

## 2017-05-17 DIAGNOSIS — Z79899 Other long term (current) drug therapy: Secondary | ICD-10-CM

## 2017-05-17 DIAGNOSIS — Z90710 Acquired absence of both cervix and uterus: Secondary | ICD-10-CM | POA: Diagnosis not present

## 2017-05-17 DIAGNOSIS — D62 Acute posthemorrhagic anemia: Secondary | ICD-10-CM | POA: Diagnosis not present

## 2017-05-17 DIAGNOSIS — X509XXA Other and unspecified overexertion or strenuous movements or postures, initial encounter: Secondary | ICD-10-CM

## 2017-05-17 DIAGNOSIS — E11649 Type 2 diabetes mellitus with hypoglycemia without coma: Secondary | ICD-10-CM | POA: Diagnosis present

## 2017-05-17 DIAGNOSIS — E876 Hypokalemia: Secondary | ICD-10-CM | POA: Diagnosis not present

## 2017-05-17 DIAGNOSIS — M1612 Unilateral primary osteoarthritis, left hip: Secondary | ICD-10-CM | POA: Diagnosis present

## 2017-05-17 HISTORY — DX: Unspecified injury of popliteal artery, right leg, initial encounter: S85.001A

## 2017-05-17 HISTORY — DX: Unspecified dislocation of unspecified knee, initial encounter: S83.106A

## 2017-05-17 HISTORY — PX: VEIN HARVEST: SHX6363

## 2017-05-17 HISTORY — DX: Elevated white blood cell count, unspecified: D72.829

## 2017-05-17 HISTORY — PX: BYPASS GRAFT POPLITEAL TO POPLITEAL: SHX5763

## 2017-05-17 HISTORY — DX: Unspecified injury of popliteal artery, unspecified leg, initial encounter: S85.009A

## 2017-05-17 HISTORY — DX: Hypokalemia: E87.6

## 2017-05-17 LAB — BASIC METABOLIC PANEL
ANION GAP: 10 (ref 5–15)
BUN: 22 mg/dL — ABNORMAL HIGH (ref 6–20)
CO2: 28 mmol/L (ref 22–32)
Calcium: 9.5 mg/dL (ref 8.9–10.3)
Chloride: 103 mmol/L (ref 101–111)
Creatinine, Ser: 0.72 mg/dL (ref 0.44–1.00)
GFR calc Af Amer: >60 mL/min (ref >60)
GFR calc non Af Amer: >60 mL/min (ref >60)
GLUCOSE: 145 mg/dL — AB (ref 65–99)
POTASSIUM: 2.9 mmol/L — AB (ref 3.5–5.1)
Sodium: 141 mmol/L (ref 135–145)

## 2017-05-17 LAB — HEMOGLOBIN A1C
Hgb A1c MFr Bld: 5.7 % — ABNORMAL HIGH (ref 4.8–5.6)
MEAN PLASMA GLUCOSE: 116.89 mg/dL

## 2017-05-17 LAB — CBC WITH DIFFERENTIAL/PLATELET
Basophils Absolute: 0 10*3/uL (ref 0.0–0.1)
Basophils Relative: 0 %
Eosinophils Absolute: 0.1 10*3/uL (ref 0.0–0.7)
Eosinophils Relative: 0 %
HCT: 46.1 % — ABNORMAL HIGH (ref 36.0–46.0)
HEMOGLOBIN: 15.6 g/dL — AB (ref 12.0–15.0)
LYMPHS PCT: 14 %
Lymphs Abs: 2 10*3/uL (ref 0.7–4.0)
MCH: 30.5 pg (ref 26.0–34.0)
MCHC: 33.8 g/dL (ref 30.0–36.0)
MCV: 90.2 fL (ref 78.0–100.0)
MONO ABS: 1.2 10*3/uL — AB (ref 0.1–1.0)
MONOS PCT: 8 %
NEUTROS ABS: 11.6 10*3/uL — AB (ref 1.7–7.7)
Neutrophils Relative %: 78 %
Platelets: 234 10*3/uL (ref 150–400)
RBC: 5.11 MIL/uL (ref 3.87–5.11)
RDW: 13.9 % (ref 11.5–15.5)
WBC: 14.9 10*3/uL — ABNORMAL HIGH (ref 4.0–10.5)

## 2017-05-17 LAB — MAGNESIUM: MAGNESIUM: 1.9 mg/dL (ref 1.7–2.4)

## 2017-05-17 LAB — ABO/RH: ABO/RH(D): A POS

## 2017-05-17 LAB — PROTIME-INR
INR: 1.04
Prothrombin Time: 13.5 seconds (ref 11.4–15.2)

## 2017-05-17 LAB — PREPARE RBC (CROSSMATCH)

## 2017-05-17 SURGERY — CREATION, BYPASS, ARTERIAL, POPLITEAL
Anesthesia: General | Laterality: Right

## 2017-05-17 MED ORDER — PROPOFOL 10 MG/ML IV BOLUS
0.5000 mg/kg | Freq: Once | INTRAVENOUS | Status: AC
  Administered 2017-05-17: 70 mg via INTRAVENOUS
  Filled 2017-05-17: qty 20

## 2017-05-17 MED ORDER — SODIUM CHLORIDE 0.9 % IV SOLN
INTRAVENOUS | Status: DC | PRN
  Administered 2017-05-17: 21:00:00

## 2017-05-17 MED ORDER — ONDANSETRON HCL 4 MG/2ML IJ SOLN
4.0000 mg | Freq: Once | INTRAMUSCULAR | Status: AC
  Administered 2017-05-17: 4 mg via INTRAVENOUS
  Filled 2017-05-17: qty 2

## 2017-05-17 MED ORDER — POTASSIUM CHLORIDE IN NACL 20-0.9 MEQ/L-% IV SOLN
INTRAVENOUS | Status: DC
  Filled 2017-05-17: qty 1000

## 2017-05-17 MED ORDER — HYDROMORPHONE HCL 1 MG/ML IJ SOLN
1.0000 mg | Freq: Once | INTRAMUSCULAR | Status: AC
Start: 2017-05-17 — End: 2017-05-17
  Administered 2017-05-17: 1 mg via INTRAVENOUS
  Filled 2017-05-17: qty 1

## 2017-05-17 MED ORDER — FENTANYL CITRATE (PF) 250 MCG/5ML IJ SOLN
INTRAMUSCULAR | Status: DC | PRN
  Administered 2017-05-17 (×2): 50 ug via INTRAVENOUS
  Administered 2017-05-17: 100 ug via INTRAVENOUS
  Administered 2017-05-17: 50 ug via INTRAVENOUS

## 2017-05-17 MED ORDER — PROTAMINE SULFATE 10 MG/ML IV SOLN
INTRAVENOUS | Status: DC | PRN
  Administered 2017-05-17: 50 mg via INTRAVENOUS

## 2017-05-17 MED ORDER — LACTATED RINGERS IV SOLN
INTRAVENOUS | Status: DC | PRN
  Administered 2017-05-17 (×2): via INTRAVENOUS

## 2017-05-17 MED ORDER — 0.9 % SODIUM CHLORIDE (POUR BTL) OPTIME
TOPICAL | Status: DC | PRN
  Administered 2017-05-17: 2000 mL

## 2017-05-17 MED ORDER — FENTANYL CITRATE (PF) 100 MCG/2ML IJ SOLN
INTRAMUSCULAR | Status: AC
  Filled 2017-05-17: qty 2

## 2017-05-17 MED ORDER — MIDAZOLAM HCL 5 MG/5ML IJ SOLN
INTRAMUSCULAR | Status: DC | PRN
  Administered 2017-05-17: 2 mg via INTRAVENOUS

## 2017-05-17 MED ORDER — HEPARIN SODIUM (PORCINE) 1000 UNIT/ML IJ SOLN
INTRAMUSCULAR | Status: DC | PRN
  Administered 2017-05-17: 10000 [IU] via INTRAVENOUS
  Administered 2017-05-17: 5000 [IU] via INTRAVENOUS

## 2017-05-17 MED ORDER — SODIUM CHLORIDE 0.9 % IV SOLN: Freq: Once | INTRAVENOUS | Status: DC

## 2017-05-17 MED ORDER — ONDANSETRON HCL 4 MG/2ML IJ SOLN
INTRAMUSCULAR | Status: DC | PRN
  Administered 2017-05-17: 4 mg via INTRAVENOUS

## 2017-05-17 MED ORDER — IOPAMIDOL (ISOVUE-370) INJECTION 76%
INTRAVENOUS | Status: AC
  Filled 2017-05-17: qty 100

## 2017-05-17 MED ORDER — PROPOFOL 10 MG/ML IV BOLUS
INTRAVENOUS | Status: AC | PRN
  Administered 2017-05-17: 70 mg via INTRAVENOUS

## 2017-05-17 MED ORDER — CEFAZOLIN SODIUM-DEXTROSE 1-4 GM/50ML-% IV SOLN
INTRAVENOUS | Status: DC | PRN
  Administered 2017-05-17: 2 g via INTRAVENOUS

## 2017-05-17 MED ORDER — SUGAMMADEX SODIUM 200 MG/2ML IV SOLN
INTRAVENOUS | Status: DC | PRN
  Administered 2017-05-17: 200 mg via INTRAVENOUS

## 2017-05-17 MED ORDER — POTASSIUM CHLORIDE 10 MEQ/100ML IV SOLN: 10.0000 meq | INTRAVENOUS | Status: AC

## 2017-05-17 MED ORDER — SUCCINYLCHOLINE CHLORIDE 20 MG/ML IJ SOLN
INTRAMUSCULAR | Status: DC | PRN
  Administered 2017-05-17: 100 mg via INTRAVENOUS

## 2017-05-17 MED ORDER — DEXAMETHASONE SODIUM PHOSPHATE 10 MG/ML IJ SOLN
INTRAMUSCULAR | Status: DC | PRN
  Administered 2017-05-17: 10 mg via INTRAVENOUS

## 2017-05-17 MED ORDER — HYDROMORPHONE HCL 1 MG/ML IJ SOLN: 0.5000 mg | Freq: Once | INTRAMUSCULAR | Status: DC

## 2017-05-17 MED ORDER — PHENYLEPHRINE HCL 10 MG/ML IJ SOLN
INTRAMUSCULAR | Status: DC | PRN
  Administered 2017-05-17 (×3): 40 ug via INTRAVENOUS

## 2017-05-17 MED ORDER — LIDOCAINE HCL (CARDIAC) 20 MG/ML IV SOLN
INTRAVENOUS | Status: DC | PRN
  Administered 2017-05-17: 60 mg via INTRATRACHEAL

## 2017-05-17 MED ORDER — PHENYLEPHRINE HCL 10 MG/ML IJ SOLN
INTRAMUSCULAR | Status: DC | PRN
  Administered 2017-05-17: 25 ug/min via INTRAVENOUS

## 2017-05-17 MED ORDER — EPHEDRINE SULFATE 50 MG/ML IJ SOLN
INTRAMUSCULAR | Status: DC | PRN
  Administered 2017-05-17: 5 mg via INTRAVENOUS

## 2017-05-17 MED ORDER — PROPOFOL 10 MG/ML IV BOLUS
INTRAVENOUS | Status: DC | PRN
  Administered 2017-05-17: 140 mg via INTRAVENOUS
  Administered 2017-05-17: 40 mg via INTRAVENOUS

## 2017-05-17 MED ORDER — FENTANYL CITRATE (PF) 100 MCG/2ML IJ SOLN
25.0000 ug | INTRAMUSCULAR | Status: DC | PRN
  Administered 2017-05-18: 50 ug via INTRAVENOUS

## 2017-05-17 MED ORDER — IOPAMIDOL (ISOVUE-370) INJECTION 76%
100.0000 mL | Freq: Once | INTRAVENOUS | Status: AC | PRN
  Administered 2017-05-17: 100 mL via INTRAVENOUS

## 2017-05-17 MED ORDER — ROCURONIUM BROMIDE 100 MG/10ML IV SOLN
INTRAVENOUS | Status: DC | PRN
  Administered 2017-05-17: 20 mg via INTRAVENOUS
  Administered 2017-05-17: 50 mg via INTRAVENOUS

## 2017-05-17 SURGICAL SUPPLY — 46 items
BAG ISL DRAPE 18X18 STRL (DRAPES) ×1
BAG ISOLATION DRAPE 18X18 (DRAPES) IMPLANT
CANISTER SUCT 3000ML PPV (MISCELLANEOUS) ×3 IMPLANT
CLIP VESOCCLUDE MED 24/CT (CLIP) ×3 IMPLANT
CLIP VESOCCLUDE SM WIDE 24/CT (CLIP) ×3 IMPLANT
DRAIN CHANNEL 15F RND FF W/TCR (WOUND CARE) ×2 IMPLANT
DRAPE ISOLATION BAG 18X18 (DRAPES) ×2
DRSG COVADERM 4X10 (GAUZE/BANDAGES/DRESSINGS) ×4 IMPLANT
ELECT REM PT RETURN 9FT ADLT (ELECTROSURGICAL) ×3
ELECTRODE REM PT RTRN 9FT ADLT (ELECTROSURGICAL) ×1 IMPLANT
EVACUATOR SILICONE 100CC (DRAIN) ×2 IMPLANT
GAUZE SPONGE 4X4 12PLY STRL LF (GAUZE/BANDAGES/DRESSINGS) ×2 IMPLANT
GLOVE BIO SURGEON STRL SZ7 (GLOVE) ×2 IMPLANT
GLOVE BIO SURGEON STRL SZ7.5 (GLOVE) ×3 IMPLANT
GLOVE BIOGEL PI IND STRL 6.5 (GLOVE) IMPLANT
GLOVE BIOGEL PI IND STRL 7.0 (GLOVE) IMPLANT
GLOVE BIOGEL PI IND STRL 7.5 (GLOVE) IMPLANT
GLOVE BIOGEL PI INDICATOR 6.5 (GLOVE) ×6
GLOVE BIOGEL PI INDICATOR 7.0 (GLOVE) ×12
GLOVE BIOGEL PI INDICATOR 7.5 (GLOVE) ×2
GOWN STRL REUS W/ TWL LRG LVL3 (GOWN DISPOSABLE) ×2 IMPLANT
GOWN STRL REUS W/ TWL XL LVL3 (GOWN DISPOSABLE) ×1 IMPLANT
GOWN STRL REUS W/TWL LRG LVL3 (GOWN DISPOSABLE) ×3
GOWN STRL REUS W/TWL XL LVL3 (GOWN DISPOSABLE) ×6
KIT BASIN OR (CUSTOM PROCEDURE TRAY) ×3 IMPLANT
KIT ROOM TURNOVER OR (KITS) ×3 IMPLANT
NS IRRIG 1000ML POUR BTL (IV SOLUTION) ×6 IMPLANT
PACK PERIPHERAL VASCULAR (CUSTOM PROCEDURE TRAY) ×3 IMPLANT
PAD ARMBOARD 7.5X6 YLW CONV (MISCELLANEOUS) ×6 IMPLANT
SPONGE LAP 18X18 X RAY DECT (DISPOSABLE) ×4 IMPLANT
STAPLER VISISTAT 35W (STAPLE) ×4 IMPLANT
SUT ETHILON 3 0 PS 1 (SUTURE) ×2 IMPLANT
SUT MNCRL AB 4-0 PS2 18 (SUTURE) ×6 IMPLANT
SUT PROLENE 5 0 C 1 24 (SUTURE) ×3 IMPLANT
SUT PROLENE 6 0 BV (SUTURE) ×7 IMPLANT
SUT SILK 2 0 SH (SUTURE) ×3 IMPLANT
SUT VIC AB 2-0 CT1 27 (SUTURE) ×15
SUT VIC AB 2-0 CT1 TAPERPNT 27 (SUTURE) ×2 IMPLANT
SUT VIC AB 3-0 SH 27 (SUTURE) ×6
SUT VIC AB 3-0 SH 27X BRD (SUTURE) ×2 IMPLANT
TAPE CLOTH SURG 4X10 WHT LF (GAUZE/BANDAGES/DRESSINGS) ×2 IMPLANT
TAPE UMBILICAL COTTON 1/8X30 (MISCELLANEOUS) ×2 IMPLANT
TOWEL GREEN STERILE (TOWEL DISPOSABLE) ×3 IMPLANT
TRAY FOLEY W/METER SILVER 16FR (SET/KITS/TRAYS/PACK) ×3 IMPLANT
UNDERPAD 30X30 (UNDERPADS AND DIAPERS) ×3 IMPLANT
WATER STERILE IRR 1000ML POUR (IV SOLUTION) ×3 IMPLANT

## 2017-05-17 NOTE — Op Note (Signed)
Patient name: [name] MRN: [id number] DOB: 02/23/1946 Sex: female  05/17/2017 Pre-operative Diagnosis: acute right lower extremity ischemia Post-operative diagnosis:  Same Surgeon:  Erlene Quan C. Donzetta Matters, MD Assistant: Arlee Muslim, PA Procedure Performed: 1.  Harvest of right greater saphenous vein 2.  Right above knee to below knee popliteal artery bypass with non-reversed greater saphenous vein  Indications: 71 year old female history of bilateral knee replacements had a fall in the snow today with a knee dislocation requiring reduction in the emergency department.  She had CT angios evidence of popliteal artery occlusion had sensory loss and no discernible signals at the level of the foot.  She was therefore indicated for the above operation.  Findings: She had significant hematoma in the below-knee popliteal space.  She was extremely oozy given that she was on Eliquis last dose this morning.  The saphenous vein measured approximately 4 mm in the mid thigh 3 mm below the knee.  Following bypass with non-reversed saphenous vein there was a strong signal in the anterior tibial artery at the ankle that augmented with compression of the bypass graft.  Given that she only has sensory loss fasciotomies were not performed.   Procedure:  The patient was identified in the holding area and taken to the operating room where she was placed supine on the operating table and general endotracheal anesthesia was induced.  She was sterilely prepped and draped in the bilateral lower extremities given antibiotics and timeout called.  We began with ultrasound-guided evaluation of her greater saphenous vein on the right leg this was marked.  We then began with incision above the knee equidistant between standard above-knee exposure in the vein.  We dissected down to the level of the fascia and divided this.  We did note that her tissue was quite friable.  We dissected out until we found the popliteal vein and  then identified our popliteal artery deeper than that.  Vesseloops were placed around this.  We also identified the greater saphenous vein within the wound.  Standard below the knee popliteal incision was then made and we dissected down to the level of the fascia opened this for there is significant hematoma.  We identified our popliteal vein placed a vessel loop around this retracted and then placed 2 Vesseloops around our popliteal artery.  We then traced out our saphenous vein in the below-knee incision.  We divided the branches between clips and ties traced this under the skin bridge up cephalad where it became much bigger.  We then extended our above-knee incision a few centimeters cephalad.  Ultimately we had enough vein for bypass.  We created a tunnel from the above-knee to below-knee popliteal arteries and then passed an umbilical tape measured the distance.  We had significant vein more than this.  Patient was then given 10,000units heparin and a separate umbilical tape was placed in the tunnel.  The vein was then prepared and repaired with 6-0 Prolene suture.  It was then spatulated at the end which was the most cephalad area.  Above-knee popliteal artery was clamped proximally and distally opened longitudinally.  The vein was then sewn end to side with 6-0 Prolene suture.  We then released the clamps and flushed through the vein which had competent valves.  The valves were then lysed and there was strong antegrade flow through the vein.  We then marked for orientation and tunneled it maintaining the orientation.  We then clamped the below-knee popliteal artery proximally distally opened this  longitudinally.  The leg was straightened and the vein was trimmed to size and spatulated.  It was then sewn end-to-side with 6-0 Prolene suture.  Prior to completing we allowed flushing maneuvers from the native artery as well as the vein.  Anesthesia was notified.  We completed the anastomosis and open.  With a  strong signal of the anterior tibial artery at the level of the ankle augmented with compression of the vein graft.  Satisfied the patient was given 50 mg of protamine which she tolerated well.  We then irrigated the wounds placed a Blake drain in the below-knee popliteal space through a separate stab incision.  We then closed our wounds with 2-0 Vicryl suture although was noted to be very friable and difficult to close.  Above the knee we were able to get few running layers but below the knee we had to use fascia with interrupted sutures.  Skin was then stapled.  Patient was Y from anesthesia having tolerated procedure well without immediate complication.  All counts were correct at completion.  EBL 500 cc.   [name] C. Donzetta Matters, MD [facility] Office: [phone] Pager: [phone]

## 2017-05-17 NOTE — ED Notes (Signed)
[facility] called for transport. 

## 2017-05-17 NOTE — Anesthesia Preprocedure Evaluation (Addendum)
Anesthesia Evaluation  Patient identified by MRN, date of birth, ID band Patient awake    Reviewed: Allergy & Precautions, H&P , NPO status , Patient's Chart, lab work & pertinent test results, reviewed documented beta blocker date and time   Airway Mallampati: III  TM Distance: >3 FB Neck ROM: Full    Dental no notable dental hx. (+) Teeth Intact, Dental Advisory Given   Pulmonary neg pulmonary ROS,    Pulmonary exam normal breath sounds clear to auscultation       Cardiovascular hypertension, Pt. on medications and Pt. on home beta blockers + dysrhythmias Atrial Fibrillation  Rhythm:Regular Rate:Normal     Neuro/Psych negative neurological ROS  negative psych ROS   GI/Hepatic negative GI ROS, Neg liver ROS,   Endo/Other  diabetesMorbid obesity  Renal/GU negative Renal ROS  negative genitourinary   Musculoskeletal  (+) Arthritis , Osteoarthritis,    Abdominal   Peds  Hematology negative hematology ROS (+)   Anesthesia Other Findings   Reproductive/Obstetrics negative OB ROS                            Anesthesia Physical Anesthesia Plan  ASA: III and emergent  Anesthesia Plan: General   Post-op Pain Management:    Induction: Intravenous, Rapid sequence and Cricoid pressure planned  PONV Risk Score and Plan: 4 or greater and Ondansetron, Dexamethasone and Midazolam  Airway Management Planned: Oral ETT  Additional Equipment:   Intra-op Plan:   Post-operative Plan: Extubation in OR  Informed Consent: I have reviewed the patients History and Physical, chart, labs and discussed the procedure including the risks, benefits and alternatives for the proposed anesthesia with the patient or authorized representative who has indicated his/her understanding and acceptance.   Dental advisory given  Plan Discussed with: CRNA  Anesthesia Plan Comments:         Anesthesia Quick  Evaluation

## 2017-05-17 NOTE — Sedation Documentation (Signed)
Right knee reduced. X-ray called for portable view to verify placement.

## 2017-05-17 NOTE — Sedation Documentation (Signed)
Knee immobilizer placed by ortho tech.

## 2017-05-17 NOTE — ED Notes (Signed)
Bed: GK81 Expected date:  Expected time:  Means of arrival:  Comments: EMS-knee deformity

## 2017-05-17 NOTE — ED Triage Notes (Signed)
Pt arrived to [facility] via [place] from home after she fell and hit her right knee. Pt does not report LOC and did not hit her head. She remembers the fall and reports past surgeries in that knee. Pt given 165mcg fentanyl by EMS. Pt is on blood thinners for chronic a-fib.

## 2017-05-17 NOTE — ED Notes (Signed)
Patient returned from CT

## 2017-05-17 NOTE — Consult Note (Signed)
Hospital Consult    Reason for Consult:  Right popliteal injury Referring Physician:  ED ([place]) MRN #:  [id number]  History of Present Illness: 71 y.o. female with history of bilateral knee replacements, slipped on ice at 11am today and leg "bent the wrong way." now complains that foot feels numb. Denies previous vascular history. Takes eliquis last at [place].  Past Medical History:  Diagnosis Date  . Abnormal Pap smear    Rare ASCUS-H  . Arthritis   . Atrial fibrillation ([place])   . Chest pain   . Diabetes mellitus without complication ([place])   . Hypercholesteremia   . Hypertension    per pt no htn but needs meds for other reason  . Obesity     Past Surgical History:  Procedure Laterality Date  . CARDIAC CATHETERIZATION  07/11/2010   Est. EF of 60-65% -- Smooth and normal coronary arteries  -- Normal LV systolic function   . CHOLECYSTECTOMY    . CYSTOCELE REPAIR  07/22/2010   Vault suspension, cystocele repair, graft and cystoscopy  . FINGER SURGERY    . NASAL SINUS SURGERY    . NEUROMA SURGERY     rt foot  . TONSILLECTOMY    . TOTAL KNEE ARTHROPLASTY  06/20/2009   left knee  . TOTAL KNEE ARTHROPLASTY  12/27/2008   right knee  . TOTAL VAGINAL HYSTERECTOMY  07/22/2010   with bilateral salpingo-oophorectomy by Dr. Joan Flores    Allergies  Allergen Reactions  . Erythromycin     Nausea  . Plavix [Clopidogrel Bisulfate]     cough  . Sulfonamide Derivatives     Rash    Prior to Admission medications   Medication Sig Start Date End Date Taking? Authorizing Provider  Ascorbic Acid (VITAMIN C) 1000 MG tablet Take 1,000 mg by mouth daily.   Yes [provider]  BIOTIN PO Take 1 capsule by mouth at bedtime.    Yes [provider]  Calcium Carbonate-Vit D-Min (GNP CALCIUM PLUS 600 +D PO) Take 1 tablet by mouth daily.    Yes [provider]  diltiazem (CARDIZEM CD) 240 MG 24 hr capsule TAKE 1 CAPSULE BY MOUTH DAILY. 06/16/16  Yes Nahser, Wonda Cheng, MD    Docusate Calcium (STOOL SOFTENER PO) Take 1 capsule by mouth at bedtime.    Yes [provider]  ELIQUIS 5 MG TABS tablet TAKE 1 TABLET BY MOUTH 2 TIMES DAILY. 02/24/17  Yes Nahser, Wonda Cheng, MD  Flaxseed, Linseed, (FLAX SEED OIL) 1000 MG CAPS Take 1,000 mg by mouth daily.    Yes [provider]  loratadine (CLARITIN) 10 MG tablet Take 10 mg by mouth daily.   Yes [provider]  multivitamin [facility]) per tablet Take 1 tablet by mouth daily.     Yes [provider]  Omega-3 Fatty Acids (FISH OIL PO) Take 1 capsule by mouth daily. 1200 mg daily   Yes [provider]  PATADAY 0.2 % SOLN Place 1 drop into both eyes daily.  04/27/16  Yes [provider]  potassium chloride (K-DUR) 10 MEQ tablet Take 1 tablet (10 mEq total) by mouth daily. Please keep 02/12/17 Appt for further refills 02/12/17  Yes Nahser, Wonda Cheng, MD  pyridOXINE (VITAMIN B-6) 100 MG tablet Take 100 mg by mouth daily.    Yes [provider]  ranitidine (ZANTAC) 150 MG tablet Take 75 mg by mouth at bedtime.    Yes [provider]  triamterene-hydrochlorothiazide (MAXZIDE) 75-50 MG per tablet  Take 0.5 tablets by mouth daily. Patient takes 1/2 tablet daily.   Yes [provider]  ACCU-CHEK AVIVA PLUS test strip  05/27/16   [provider]  amoxicillin (AMOXIL) 500 MG capsule 1,000 mg. Take 2000mg   prior dental procedures 03/28/15   [provider]  mometasone (NASONEX) 50 MCG/ACT nasal spray Place 1 spray into the nose daily as needed for congestion. 01/05/17   [provider]  propranolol (INDERAL) 10 MG tablet Take 10 mg by mouth daily as needed (irregular heartbeat).     [provider]    Social History   Socioeconomic History  . Marital status: Married    Spouse name: Not on file  . Number of children: Not on file  . Years of education: Not on file  . Highest education level: Not on file  Social Needs  .  Financial resource strain: Not on file  . Food insecurity - worry: Not on file  . Food insecurity - inability: Not on file  . Transportation needs - medical: Not on file  . Transportation needs - non-medical: Not on file  Occupational History  . Not on file  Tobacco Use  . Smoking status: Never Smoker  . Smokeless tobacco: Never Used  Substance and Sexual Activity  . Alcohol use: No  . Drug use: No  . Sexual activity: Yes    Partners: Male    Birth control/protection: Surgical    Comment: LAVH  Other Topics Concern  . Not on file  Social History Narrative  . Not on file     Family History  Problem Relation Age of Onset  . Emphysema Father   . Atrial fibrillation Mother        has pacemaker  . Diabetes Mother   . Hypertension Mother   . Other Brother        bulbar palsy    ROS: [x]  Positive   [ ]  Negative   [ ]  All sytems reviewed and are negative  Cardiovascular: []  chest pain/pressure []  palpitations []  SOB lying flat []  DOE []  pain in legs while walking []  pain in legs at rest []  pain in legs at night []  non-healing ulcers []  hx of DVT [x]  swelling in legs  Pulmonary: []  productive cough []  asthma/wheezing []  home O2  Neurologic: []  weakness in []  arms []  legs []  numbness in []  arms []  legs []  hx of CVA []  mini stroke [] difficulty speaking or slurred speech []  temporary loss of vision in one eye []  dizziness  Hematologic: []  hx of cancer []  bleeding problems []  problems with blood clotting easily  Endocrine:   []  diabetes []  thyroid disease  GI []  vomiting blood []  blood in stool  GU: []  CKD/renal failure []  HD--[]  M/W/F or []  T/T/S []  burning with urination []  blood in urine  Psychiatric: []  anxiety []  depression  Musculoskeletal: []  arthritis [x]  joint pain  Integumentary: []  rashes []  ulcers  Constitutional: []  fever []  chills   Physical Examination  Vitals:   05/17/17 1730 05/17/17 1745  BP: (!) 106/51 111/67    Pulse: 71 75  Resp: 12 (!) 24  Temp:    SpO2: 98% 100%   Body mass index is 44.7 kg/m.  General:  WDWN in NAD HENT: WNL, normocephalic Pulmonary: normal non-labored breathing, without Rales, rhonchi,  wheezing Cardiac: 2+ bilateral femoral  Palpable left dp No signals on right Abdomen: soft, NT/ND, no masses Musculoskeletal: no muscle wasting or atrophy  Neurologic: numb right foot,  motor in tact Psychiatric:  Appropriate mood and affect  CBC    Component Value Date/Time   WBC 14.9 (H) 05/17/2017 1357   RBC 5.11 05/17/2017 1357   HGB 15.6 (H) 05/17/2017 1357   HCT 46.1 (H) 05/17/2017 1357   PLT 234 05/17/2017 1357   MCV 90.2 05/17/2017 1357   MCH 30.5 05/17/2017 1357   MCHC 33.8 05/17/2017 1357   RDW 13.9 05/17/2017 1357   LYMPHSABS 2.0 05/17/2017 1357   MONOABS 1.2 (H) 05/17/2017 1357   EOSABS 0.1 05/17/2017 1357   BASOSABS 0.0 05/17/2017 1357    BMET    Component Value Date/Time   NA 141 05/17/2017 1357   NA 143 02/10/2017 1010   K 2.9 (L) 05/17/2017 1357   CL 103 05/17/2017 1357   CO2 28 05/17/2017 1357   GLUCOSE 145 (H) 05/17/2017 1357   BUN 22 (H) 05/17/2017 1357   BUN 20 02/10/2017 1010   CREATININE 0.72 05/17/2017 1357   CREATININE 0.79 01/21/2016 0947   CALCIUM 9.5 05/17/2017 1357   GFRNONAA >60 05/17/2017 1357   GFRAA >60 05/17/2017 1357    COAGS: Lab Results  Component Value Date   INR 1.04 05/17/2017   INR 1.51 (H) 06/23/2009   INR 1.17 06/22/2009     Non-Invasive Vascular Imaging:   IMPRESSION: There is occlusion of the popliteal artery likely related to focal dissection secondary to the known knee dislocation. Reconstitution of the distal popliteal artery is noted with three-vessel runoff to the mid to distal lower extremity. The timing of the contrast bolus precludes evaluation of the vasculature at the level of the ankle. Duplex evaluation of the popliteal artery but may also be helpful to delineate the length of the arterial  abnormality.  Critical Value/emergent results were called by telephone at the time of interpretation on 05/17/2017 at 4:51 pm to Dr. Carmin Muskrat , who verbally acknowledged these results.    ASSESSMENT/PLAN: This is a 71 y.o. female with ischemic right foot s/p knee dislocation. Discussed operative intervention and will take to OR tonight for right above to below knee popliteal artery bypass on right. She demonstrates good understanding. No heparin now as she has had eliquis.   Zeffie Bickert C. Donzetta Matters, MD [facility] Office: [phone] Pager: [phone]

## 2017-05-17 NOTE — Anesthesia Procedure Notes (Signed)
Procedure Name: Intubation Date/Time: 05/17/2017 8:54 PM Performed by: Clovis Cao, CRNA Pre-anesthesia Checklist: Patient identified, Emergency Drugs available, Suction available, Patient being monitored and Timeout performed Patient Re-evaluated:Patient Re-evaluated prior to induction Oxygen Delivery Method: Circle system utilized Preoxygenation: Pre-oxygenation with 100% oxygen Induction Type: IV induction, Rapid sequence and Cricoid Pressure applied Laryngoscope Size: Miller and 2 Grade View: Grade I Tube type: Oral Tube size: 7.5 mm Number of attempts: 2 (Grade 1 view first attempt- esophageal intubation. Second DL grade 1 view and easy intubation.) Airway Equipment and Method: Stylet Placement Confirmation: ETT inserted through vocal cords under direct vision,  positive ETCO2 and breath sounds checked- equal and bilateral Secured at: 22 cm Tube secured with: Tape Dental Injury: Teeth and Oropharynx as per pre-operative assessment

## 2017-05-17 NOTE — ED Provider Notes (Signed)
5:33 PM Care assumed from Dr. Vanita Panda.  At time of transfer of care, patient is awaiting admission for significant knee injury sustained earlier today.  Patient found to have dislocation of a prosthetic knee joint.  Orthopedics was able to reduce it in the emergency department with a procedural sedation by the emergency team.  After reduction, patient had a CT revealing a popliteal artery dissection.  By report, orthopedics was going to admit the patient for observation, PT/OT, and further monitoring of the knee injury however with the dissection, the vascular surgery team told the orthopedics team that they will likely need to place a stent at some point this week.  Thus, the orthopedics team is requesting patient be admitted to the hospitalist service at [facility] as this is where patient can have both surgical specialties provide the care she needs.  5:52 PM The vascular surgery team called and expressed concern that patient may need operative intervention tonight.  They requested patient be made n.p.o. and continue the plan for admission at [facility] but they will come here to see the patient right now.  N.p.o. order was placed and patient will be admitted to [facility].  Hospitalist team will be called for admission and transfer to [facility].     Clinical Impression: 1. Knee dislocation, right, initial encounter   2. Popliteal artery injury, right, initial encounter   3. Popliteal artery injury     Disposition: Admit  Prior to providing a prescription for a controlled substance, I independently reviewed the patient's recent prescription history on the [place]. The patient had no recent or regular prescriptions and was deemed appropriate for a brief, less than 3 day prescription of narcotic for acute analgesia.  This note was prepared with assistance of Systems analyst. Occasional wrong-word or sound-a-like substitutions may have  occurred due to the inherent limitations of voice recognition software.     Zoran Yankee, Gwenyth Allegra, MD 05/17/17 [id number]

## 2017-05-17 NOTE — H&P (Addendum)
ORTHOPAEDIC  H and P  REQUESTING PHYSICIAN: Carmin Muskrat, MD  PCP:  Leonard Downing, MD  Chief Complaint: Right knee injury  HPI: [name] is a 71 y.o. female who complains of right knee pain following a hyperextension injury to the right knee earlier today.  She states she stepped out onto the back deck to feed the birds, and had a slip of the right foot and that resulted in a hyperextension injury.  She had immediate pain and inability to bear weight.  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  This was performed back in 2010 by Dr. supple.  She has had no specific issues with that knee since that time.  She does have left hip arthritis that is being managed by Dr. Adriana Mccallum in my office.  She complains of a little bit of paresthesias in the right foot but no real numbness.  She is able to wiggle her toes and ankle.  Past Medical History:  Diagnosis Date  . Abnormal Pap smear    Rare ASCUS-H  . Arthritis   . Atrial fibrillation ([place])   . Chest pain   . Diabetes mellitus without complication ([place])   . Hypercholesteremia   . Hypertension    per pt no htn but needs meds for other reason  . Obesity    Past Surgical History:  Procedure Laterality Date  . CARDIAC CATHETERIZATION  07/11/2010   Est. EF of 60-65% -- Smooth and normal coronary arteries  -- Normal LV systolic function   . CHOLECYSTECTOMY    . CYSTOCELE REPAIR  07/22/2010   Vault suspension, cystocele repair, graft and cystoscopy  . FINGER SURGERY    . NASAL SINUS SURGERY    . NEUROMA SURGERY     rt foot  . TONSILLECTOMY    . TOTAL KNEE ARTHROPLASTY  06/20/2009   left knee  . TOTAL KNEE ARTHROPLASTY  12/27/2008   right knee  . TOTAL VAGINAL HYSTERECTOMY  07/22/2010   with bilateral salpingo-oophorectomy by Dr. Joan Flores   Social History   Socioeconomic History  . Marital status: Married    Spouse name: None  .  Number of children: None  . Years of education: None  . Highest education level: None  Social Needs  . Financial resource strain: None  . Food insecurity - worry: None  . Food insecurity - inability: None  . Transportation needs - medical: None  . Transportation needs - non-medical: None  Occupational History  . None  Tobacco Use  . Smoking status: Never Smoker  . Smokeless tobacco: Never Used  Substance and Sexual Activity  . Alcohol use: No  . Drug use: No  . Sexual activity: Yes    Partners: Male    Birth control/protection: Surgical    Comment: LAVH  Other Topics Concern  . None  Social History Narrative  . None   Family History  Problem Relation Age of Onset  . Emphysema Father   . Atrial fibrillation Mother        has pacemaker  . Diabetes Mother   . Hypertension Mother   . Other Brother        bulbar palsy   Allergies  Allergen Reactions  . Erythromycin     Nausea  . Plavix [Clopidogrel Bisulfate]     cough  . Sulfonamide Derivatives     Rash   Prior to Admission medications  Medication Sig Start Date End Date Taking? Authorizing Provider  ACCU-CHEK AVIVA PLUS test strip  05/27/16   [provider]  amoxicillin (AMOXIL) 500 MG capsule 1,000 mg. Take 2000mg   prior dental procedures 03/28/15   [provider]  Ascorbic Acid (VITAMIN C) 1000 MG tablet Take 1,000 mg by mouth daily.    [provider]  BIOTIN PO Take 1 capsule by mouth daily.     [provider]  Calcium Carbonate-Vit D-Min (GNP CALCIUM PLUS 600 +D PO) Take 1 tablet by mouth daily.     [provider]  diltiazem (CARDIZEM CD) 240 MG 24 hr capsule TAKE 1 CAPSULE BY MOUTH DAILY. 06/16/16   Nahser, Wonda Cheng, MD  Docusate Calcium (STOOL SOFTENER PO) Take 1 capsule by mouth daily.     [provider]  ELIQUIS 5 MG TABS tablet TAKE 1 TABLET BY MOUTH 2 TIMES DAILY. 02/24/17   Nahser, Wonda Cheng, MD  Flaxseed, Linseed, (FLAX SEED OIL) 1000 MG CAPS Take  1,000 mg by mouth daily.     [provider]  loratadine (CLARITIN) 10 MG tablet Take 10 mg by mouth daily.    [provider]  mometasone (NASONEX) 50 MCG/ACT nasal spray Place 1 spray into the nose daily as needed for congestion. 01/05/17   [provider]  multivitamin [facility]) per tablet Take 1 tablet by mouth daily.      [provider]  Omega-3 Fatty Acids (FISH OIL PO) Take 1 capsule by mouth daily. 1200 mg daily    [provider]  PATADAY 0.2 % SOLN Place 1 drop into both eyes daily.  04/27/16   [provider]  potassium chloride (K-DUR) 10 MEQ tablet Take 1 tablet (10 mEq total) by mouth daily. Please keep 02/12/17 Appt for further refills 02/12/17   Nahser, Wonda Cheng, MD  propranolol (INDERAL) 10 MG tablet Take 10 mg by mouth daily as needed.    [provider]  pyridOXINE (VITAMIN B-6) 100 MG tablet Take 100 mg by mouth 2 (two) times daily.    [provider]  ranitidine (ZANTAC) 150 MG tablet Take 75 mg by mouth daily.    [provider]  triamterene-hydrochlorothiazide (MAXZIDE) 75-50 MG per tablet Take 1 tablet by mouth daily. Patient takes 1/2 tablet daily.    [provider]   Dg Knee Right Port  Result Date: 05/17/2017 CLINICAL DATA:  Right knee pain after fall today EXAM: PORTABLE RIGHT KNEE - 1-2 VIEW COMPARISON:  None. FINDINGS: Status post right total knee arthroplasty. Anterior dislocation of the right tibia at the right knee joint. Large right knee joint effusion. Suggestion of cortical irregularity at the proximal right fibula, cannot exclude a nondisplaced fracture in this location. No additional discrete osseous fracture. No evidence of hardware fracture or loosening. No suspicious focal osseous lesion. Small superior right patellar enthesophyte. Diffuse right knee soft tissue swelling. IMPRESSION: 1. Anterior right knee dislocation with large right knee joint effusion. 2. Possible  nondisplaced proximal right fibula fracture. 3. Right total knee arthroplasty without evidence of hardware fracture or loosening. Electronically Signed   By: Ilona Sorrel M.D.   On: 05/17/2017 13:59    Positive ROS: All other systems have been reviewed and were otherwise negative with the exception of those mentioned in the HPI and as above.  Physical Exam: General: Alert, no acute distress Cardiovascular: No pedal edema Respiratory: No cyanosis, no use of accessory musculature GI: No organomegaly, abdomen is soft and non-tender  Skin: No lesions in the area of chief complaint Neurologic: Sensation intact distally Psychiatric: Patient is competent for consent with normal mood and affect Lymphatic: No axillary or cervical lymphadenopathy  MUSCULOSKELETAL:  Physical exam right leg:  Well-healed midline incision about the knee.  She has a buttonhole deformity noted on the medial aspect.  No open wounds however skin is intact.  Distally her calf is soft.  She endorses sensation intact light touch in the deep and superficial peroneal nerves as well as the sural nerve, saphenous nerve and tibial nerve.  She has a palpable 1+ dorsalis pedis and posterior tibial pulse.  Active motor is intact.  Assessment: 1.  Dislocation of right total knee arthroplasty, closed.  Plan: -Plan for emergent closed reduction in the emergency department of the right total knee.  No clinical signs of vascular injury at this point.  We will admit post reduction to follow her pulse exam. -She will be weightbearing as tolerated in a knee immobilizer post reduction and will need physical therapy before being discharged home. -We reviewed the risks, benefits and indications of the close reduction of her right knee.  She is in agreement with proceeding.      Nicholes Stairs, MD  Cell [phone]    05/17/2017 2:46 PM     Addendum:  CTA demonstrates findings concerning for popliteal artery injury but  with distal runoff.  She will likely need a vascular intervention in the upcoming days, allowing for Eliquis washout.  Given the additional issues and her medical profile, would request Hospitalist help with admission and inpatient care.  -regarding the right knee, ok for full WBAT to RLE, but knee immobilizer to be worn at all times.

## 2017-05-17 NOTE — H&P (Signed)
History and Physical    [name] IRC:[id number] DOB: March 26, 1946 DOA: 05/17/2017  PCP: Leonard Downing, MD  Patient coming from: home  I have personally briefly reviewed patient's old medical records in [place]  Chief Complaint: fall, knee pain  HPI: [name] is a 71 y.o. female with medical history significant of history of bilateral knee replacements, atrial fibrillation, HTN, and chart hx of DM (though pt notes this is incorrect) who presents after fall with hyperextension of knee.   [name] states she was outside feeding the birds when she took a step, the snow shifted below her, and her R knee hyperextended.  No CP, SOB, LOC, head trauma, LH.  She came to the ED and was found to have dislocated R knee which was reduced by orthopedics.  She notes some numbness to the bottom of her foot and pain to  Her knee at this time.  CTA was notable for occlusion of popliteal artery and vascular was consulted.         ED Course: Labs, plain films, ortho consult, CTA knee.  Vascular consult.  Hospitalist consulted for admission.   Review of Systems: As per HPI otherwise 10 point review of systems negative.   Past Medical History:  Diagnosis Date  . Abnormal Pap smear    Rare ASCUS-H  . Arthritis   . Atrial fibrillation ([place])   . Chest pain   . Diabetes mellitus without complication ([place])   . Hypercholesteremia   . Hypertension    per pt no htn but needs meds for other reason  . Obesity     Past Surgical History:  Procedure Laterality Date  . CARDIAC CATHETERIZATION  07/11/2010   Est. EF of 60-65% -- Smooth and normal coronary arteries  -- Normal LV systolic function   . CHOLECYSTECTOMY    . CYSTOCELE REPAIR  07/22/2010   Vault suspension, cystocele repair, graft and cystoscopy  . FINGER SURGERY    . NASAL SINUS SURGERY    . NEUROMA SURGERY     rt foot  . TONSILLECTOMY    . TOTAL KNEE ARTHROPLASTY  06/20/2009   left knee  . TOTAL KNEE ARTHROPLASTY   12/27/2008   right knee  . TOTAL VAGINAL HYSTERECTOMY  07/22/2010   with bilateral salpingo-oophorectomy by Dr. Joan Flores     reports that  has never smoked. she has never used smokeless tobacco. She reports that she does not drink alcohol or use drugs.  Allergies  Allergen Reactions  . Erythromycin     Nausea  . Plavix [Clopidogrel Bisulfate]     cough  . Sulfonamide Derivatives     Rash    Family History  Problem Relation Age of Onset  . Emphysema Father   . Atrial fibrillation Mother        has pacemaker  . Diabetes Mother   . Hypertension Mother   . Other Brother        bulbar palsy   Prior to Admission medications   Medication Sig Start Date End Date Taking? Authorizing Provider  Ascorbic Acid (VITAMIN C) 1000 MG tablet Take 1,000 mg by mouth daily.   Yes [provider]  BIOTIN PO Take 1 capsule by mouth at bedtime.    Yes [provider]  Calcium Carbonate-Vit D-Min (GNP CALCIUM PLUS 600 +D PO) Take 1 tablet by mouth daily.    Yes [provider]  diltiazem (CARDIZEM CD) 240 MG 24 hr capsule TAKE 1 CAPSULE  BY MOUTH DAILY. 06/16/16  Yes Nahser, Wonda Cheng, MD  Docusate Calcium (STOOL SOFTENER PO) Take 1 capsule by mouth at bedtime.    Yes [provider]  ELIQUIS 5 MG TABS tablet TAKE 1 TABLET BY MOUTH 2 TIMES DAILY. 02/24/17  Yes Nahser, Wonda Cheng, MD  Flaxseed, Linseed, (FLAX SEED OIL) 1000 MG CAPS Take 1,000 mg by mouth daily.    Yes [provider]  loratadine (CLARITIN) 10 MG tablet Take 10 mg by mouth daily.   Yes [provider]  multivitamin [facility]) per tablet Take 1 tablet by mouth daily.     Yes [provider]  Omega-3 Fatty Acids (FISH OIL PO) Take 1 capsule by mouth daily. 1200 mg daily   Yes [provider]  PATADAY 0.2 % SOLN Place 1 drop into both eyes daily.  04/27/16  Yes [provider]  potassium chloride (K-DUR) 10 MEQ tablet Take 1 tablet (10 mEq total) by mouth daily.  Please keep 02/12/17 Appt for further refills 02/12/17  Yes Nahser, Wonda Cheng, MD  pyridOXINE (VITAMIN B-6) 100 MG tablet Take 100 mg by mouth daily.    Yes [provider]  ranitidine (ZANTAC) 150 MG tablet Take 75 mg by mouth at bedtime.    Yes [provider]  triamterene-hydrochlorothiazide (MAXZIDE) 75-50 MG per tablet Take 0.5 tablets by mouth daily. Patient takes 1/2 tablet daily.   Yes [provider]  ACCU-CHEK AVIVA PLUS test strip  05/27/16   [provider]  amoxicillin (AMOXIL) 500 MG capsule 1,000 mg. Take 2000mg   prior dental procedures 03/28/15   [provider]  mometasone (NASONEX) 50 MCG/ACT nasal spray Place 1 spray into the nose daily as needed for congestion. 01/05/17   [provider]  propranolol (INDERAL) 10 MG tablet Take 10 mg by mouth daily as needed (irregular heartbeat).     [provider]    Physical Exam: Vitals:   05/17/17 1730 05/17/17 1745 05/17/17 1900 05/17/17 1915  BP: (!) 106/51 111/67 124/64 (!) 117/59  Pulse: 71 75 73 69  Resp: 12 (!) 24 15 17   Temp:      TempSrc:      SpO2: 98% 100% 100% 98%  Weight:      Height:        Constitutional: NAD, calm, comfortable Vitals:   05/17/17 1730 05/17/17 1745 05/17/17 1900 05/17/17 1915  BP: (!) 106/51 111/67 124/64 (!) 117/59  Pulse: 71 75 73 69  Resp: 12 (!) 24 15 17   Temp:      TempSrc:      SpO2: 98% 100% 100% 98%  Weight:      Height:       Eyes: PERRL, lids and conjunctivae normal ENMT: Mucous membranes are moist. Posterior pharynx clear of any exudate or lesions.Normal dentition.  Neck: normal, supple, no masses, no thyromegaly Respiratory: clear to auscultation bilaterally, no wheezing, no crackles. Normal respiratory effort. No accessory muscle use.  Cardiovascular: Regular rate and rhythm, no murmurs / rubs / gallops. No extremity edema. Pulses not palpable to RLE.  Abdomen: no tenderness, no masses palpated. No hepatosplenomegaly.  Bowel sounds positive.  Musculoskeletal: R knee with brace Skin: no rashes, lesions, ulcers. No induration Neurologic: CN 2-12 grossly intact. Sensation intact.  Able to wiggle toes.  Psychiatric: Normal judgment and insight. Alert and oriented x 3. Normal mood.   Labs on Admission: I have personally reviewed following labs and imaging studies  CBC: Recent Labs  Lab 05/17/17  1357  WBC 14.9*  NEUTROABS 11.6*  HGB 15.6*  HCT 46.1*  MCV 90.2  PLT 284   Basic Metabolic Panel: Recent Labs  Lab 05/17/17 1357  NA 141  K 2.9*  CL 103  CO2 28  GLUCOSE 145*  BUN 22*  CREATININE 0.72  CALCIUM 9.5  MG 1.9   GFR: Estimated Creatinine Clearance: 94.7 mL/min (by C-G formula based on SCr of 0.72 mg/dL). Liver Function Tests: No results for input(s): AST, ALT, ALKPHOS, BILITOT, PROT, ALBUMIN in the last 168 hours. No results for input(s): LIPASE, AMYLASE in the last 168 hours. No results for input(s): AMMONIA in the last 168 hours. Coagulation Profile: Recent Labs  Lab 05/17/17 1357  INR 1.04   Cardiac Enzymes: No results for input(s): CKTOTAL, CKMB, CKMBINDEX, TROPONINI in the last 168 hours. BNP (last 3 results) No results for input(s): PROBNP in the last 8760 hours. HbA1C: No results for input(s): HGBA1C in the last 72 hours. CBG: No results for input(s): GLUCAP in the last 168 hours. Lipid Profile: No results for input(s): CHOL, HDL, LDLCALC, TRIG, CHOLHDL, LDLDIRECT in the last 72 hours. Thyroid Function Tests: No results for input(s): TSH, T4TOTAL, FREET4, T3FREE, THYROIDAB in the last 72 hours. Anemia Panel: No results for input(s): VITAMINB12, FOLATE, FERRITIN, TIBC, IRON, RETICCTPCT in the last 72 hours. Urine analysis:    Component Value Date/Time   COLORURINE YELLOW 06/17/2009 [place] 06/17/2009 1308   LABSPEC 1.020 06/17/2009 1308   PHURINE 7.0 06/17/2009 1308   GLUCOSEU NEGATIVE 06/17/2009 1308   HGBUR NEGATIVE 06/17/2009 1308    BILIRUBINUR n 01/25/2013 1113   KETONESUR NEGATIVE 06/17/2009 1308   PROTEINUR n 01/25/2013 1113   PROTEINUR NEGATIVE 06/17/2009 1308   UROBILINOGEN negative 01/25/2013 1113   UROBILINOGEN 0.2 06/17/2009 1308   NITRITE n 01/25/2013 1113   NITRITE NEGATIVE 06/17/2009 1308   LEUKOCYTESUR Negative 01/25/2013 1113    Radiological Exams on Admission: Dg Knee 2 Views Right  Result Date: 05/17/2017 CLINICAL DATA:  Status post reduction EXAM: RIGHT KNEE - 1-2 VIEW COMPARISON:  Films from earlier in the same day FINDINGS: Previously seen knee dislocation has been reduced. No acute fractures noted. No prosthetic abnormality is seen. IMPRESSION: Reduction of previously seen knee dislocation Electronically Signed   By: Inez Catalina M.D.   On: 05/17/2017 15:42   Ct Angio Low Extrem Right W &/or Wo Contrast  Result Date: 05/17/2017 CLINICAL DATA:  Recent right knee dislocation EXAM: CT ANGIOGRAPHY OF THE RIGHT LOWEREXTREMITY TECHNIQUE: Multidetector CT imaging of the right lower extremity was performed using the standard protocol during bolus administration of intravenous contrast. Multiplanar CT image reconstructions and MIPs were obtained to evaluate the vascular anatomy. CONTRAST:  170mL ISOVUE-370 IOPAMIDOL (ISOVUE-370) INJECTION 76% COMPARISON:  None FINDINGS: Right external iliac artery and common femoral artery are widely patent. The femoral bifurcation is widely patent. Superficial femoral artery is patent. The proximal popliteal artery is patent although there is evidence of abrupt occlusion just above the level of the right knee prosthesis. Minimal reconstitution is noted of the popliteal artery posterior to the knee with more distal reconstitution of the distal popliteal artery and popliteal trifurcation. The timing of the contrast bolus limits evaluation at the level of the ankle although three-vessel runoff to the level of the mid calf is noted. No acute bony abnormality is noted. Right knee  prosthesis is seen which somewhat limits the evaluation of the popliteal artery although the level of occlusion occurs above the  prosthesis. Subcutaneous edema is noted related to the recent injury. A small joint effusion is noted. Review of the MIP images confirms the above findings. IMPRESSION: There is occlusion of the popliteal artery likely related to focal dissection secondary to the known knee dislocation. Reconstitution of the distal popliteal artery is noted with three-vessel runoff to the mid to distal lower extremity. The timing of the contrast bolus precludes evaluation of the vasculature at the level of the ankle. Duplex evaluation of the popliteal artery but may also be helpful to delineate the length of the arterial abnormality. Critical Value/emergent results were called by telephone at the time of interpretation on 05/17/2017 at 4:51 pm to Dr. Carmin Muskrat , who verbally acknowledged these results. Electronically Signed   By: Inez Catalina M.D.   On: 05/17/2017 16:54   Dg Knee Right Port  Result Date: 05/17/2017 CLINICAL DATA:  Right knee pain after fall today EXAM: PORTABLE RIGHT KNEE - 1-2 VIEW COMPARISON:  None. FINDINGS: Status post right total knee arthroplasty. Anterior dislocation of the right tibia at the right knee joint. Large right knee joint effusion. Suggestion of cortical irregularity at the proximal right fibula, cannot exclude a nondisplaced fracture in this location. No additional discrete osseous fracture. No evidence of hardware fracture or loosening. No suspicious focal osseous lesion. Small superior right patellar enthesophyte. Diffuse right knee soft tissue swelling. IMPRESSION: 1. Anterior right knee dislocation with large right knee joint effusion. 2. Possible nondisplaced proximal right fibula fracture. 3. Right total knee arthroplasty without evidence of hardware fracture or loosening. Electronically Signed   By: Ilona Sorrel M.D.   On: 05/17/2017 13:59    EKG:  Independently reviewed. none  Assessment/Plan Principal Problem:   Popliteal artery injury Active Problems:   PAF (paroxysmal atrial fibrillation) (HCC)   HTN (hypertension)   Knee dislocation   Hypokalemia   Leukocytosis  Pulseless R foot  Popliteal Artery Occlusion:  CTA with "occlusion of politeal artery likely related to focal dissection secondary to known knee dislocation".  Pulses not palpable on exam.  Vascular planning for OR tonight.  Holding eliquis.  Vascular planning for heparin post op.    Right Knee Dislocation  History of bilateral knee replacements: s/p reduction by ortho in the ED.  In knee immobilizer (wear at all times) and WBAT per ortho.  Atrial Fibrillation:  Rate controlled.  Holding eliquis as noted above.  Resume when able.   Hypertension:  BP's normal to soft at this time.  Holding maxzide.  Hypokalemia: replete, f/u mg, k in fluids  Leukocytosis: suspect reactive, no si/sx of infection, continue to monitor  Prediabetes  ? Hx T2DM: pt states A1c never diagnosed with DM.  F/u A1c.  SSI as needed.    DVT prophylaxis: SCD Code Status: full  Family Communication: husband at bedside Disposition Plan: pending surgery  Consults called: Vascular, Orthopedics  Admission status: inpatient    Fayrene Helper MD [facility] Pager [phone]  If 7PM-7AM, please contact night-coverage [email or web address] Password [facility]  05/17/2017, 8:32 PM

## 2017-05-17 NOTE — ED Notes (Signed)
Patient transported to CT 

## 2017-05-17 NOTE — Procedures (Signed)
05/17/2017  Surgeon: Geralynn Rile, MD  Assistant: None   Anesthesia: Conscious sedation provided by emergency department physician  Estimated blood loss: None   Indications:  Ms. Deemer Is a 71 year old female who had an unfortunate hyperextension injury to the right leg earlier today.  She has a history of total knee arthroplasty performed back in 2000.  She is done well following that surgery however during the course of this accident she sustained a dislocation of her prosthetic right knee.  After review of the radiographs and discussion with the emergency department providers, we decided to perform closed reduction maneuver in the emergency department to help reestablish blood flow, as she had a concerning pulse exam on presentation.  We reviewed the risk, benefits, and indications of this procedure and she did provide informed consent.   Procedure in detail:  After appropriate conscious sedation was provided by the emergency department physician we moved forward with the closed reduction maneuver of the right knee.  Utilizing longitudinal traction through the right leg and countertraction provided by stabilization of the pelvis the right knee was easily reduced with a palpable clunk.  A medial double signed resolved as well.  Post reduction I was able to range the knee from 0-90 with no issues.  Following the reduction she had a barely palpable posterior tibialis pulse, and a nonpalpable dorsalis pedis pulse, this was consistent with her prereduction exam.  The foot was warm and well-perfused, with capillary refill less than 2 seconds.  The patient was awakened from conscious sedation with no postprocedural issues.  There were no immediate complications.   Disposition:  Patient will be placed in a knee immobilizer in the emergency department and will maintain in that for the next 2-3 weeks.  She may weight-bear as tolerated in that knee immobilizer.  Given the concerning vascular  findings clinically, we are going to move forward with a emergent CT angiogram to evaluate for popliteal artery injury given the history of recent knee dislocation.  She will be admitted to the medicine service for management of the potential artery injury as well as for post reduction PT and OT.

## 2017-05-17 NOTE — Sedation Documentation (Signed)
X-ray at bedside

## 2017-05-17 NOTE — ED Notes (Signed)
ED Provider at bedside. 

## 2017-05-17 NOTE — ED Provider Notes (Addendum)
[facility] DEPT Provider Note   CSN: [id number] Arrival date & time: 05/17/17  1301     History   Chief Complaint Chief Complaint  Patient presents with  . Fall    HPI [name] is a 71 y.o. female.  HPI Patient presents immediately after a fall. Patient notes that she was in her usual state of health, when she slipped on snow, while trying to feed the birds. Her right knee hyperextended, substantially, and since that time she has had severe pain about the knee joint, inability to bend the knee, and inability to ambulate. She did not fall, denies head trauma, loss of consciousness, confusion, disorientation, pain in any other area of her body. She denies loss of sensation or strength in her toes or ankle. She is here with her husband who assists with the HPI. Past Medical History:  Diagnosis Date  . Abnormal Pap smear    Rare ASCUS-H  . Arthritis   . Atrial fibrillation ([place])   . Chest pain   . Diabetes mellitus without complication ([place])   . Hypercholesteremia   . Hypertension    per pt no htn but needs meds for other reason  . Obesity     Patient Active Problem List   Diagnosis Date Noted  . HTN (hypertension) 07/02/2015  . Hyperlipidemia 02/04/2011  . Hyperglycemia 02/04/2011  . Leg edema 02/04/2011  . PAF (paroxysmal atrial fibrillation) ([place]) 02/02/2011  . Leg swelling 02/02/2011    Past Surgical History:  Procedure Laterality Date  . CARDIAC CATHETERIZATION  07/11/2010   Est. EF of 60-65% -- Smooth and normal coronary arteries  -- Normal LV systolic function   . CHOLECYSTECTOMY    . CYSTOCELE REPAIR  07/22/2010   Vault suspension, cystocele repair, graft and cystoscopy  . FINGER SURGERY    . NASAL SINUS SURGERY    . NEUROMA SURGERY     rt foot  . TONSILLECTOMY    . TOTAL KNEE ARTHROPLASTY  06/20/2009   left knee  . TOTAL KNEE ARTHROPLASTY  12/27/2008   right knee  . TOTAL VAGINAL HYSTERECTOMY  07/22/2010   with bilateral salpingo-oophorectomy by Dr. Joan Flores    OB History    Gravida Para Term Preterm AB Living   2 2 2     2    SAB TAB Ectopic Multiple Live Births           2       Home Medications    Prior to Admission medications   Medication Sig Start Date End Date Taking? Authorizing Provider  ACCU-CHEK AVIVA PLUS test strip  05/27/16   [provider]  amoxicillin (AMOXIL) 500 MG capsule 1,000 mg. Take 2000mg   prior dental procedures 03/28/15   [provider]  Ascorbic Acid (VITAMIN C) 1000 MG tablet Take 1,000 mg by mouth daily.    [provider]  BIOTIN PO Take 1 capsule by mouth daily.     [provider]  Calcium Carbonate-Vit D-Min (GNP CALCIUM PLUS 600 +D PO) Take 1 tablet by mouth daily.     [provider]  diltiazem (CARDIZEM CD) 240 MG 24 hr capsule TAKE 1 CAPSULE BY MOUTH DAILY. 06/16/16   Nahser, Wonda Cheng, MD  Docusate Calcium (STOOL SOFTENER PO) Take 1 capsule by mouth daily.     [provider]  ELIQUIS 5 MG TABS tablet TAKE 1 TABLET BY MOUTH 2 TIMES DAILY. 02/24/17   Nahser, Wonda Cheng, MD  Flaxseed, Linseed, (  FLAX SEED OIL) 1000 MG CAPS Take 1,000 mg by mouth daily.     [provider]  loratadine (CLARITIN) 10 MG tablet Take 10 mg by mouth daily.    [provider]  mometasone (NASONEX) 50 MCG/ACT nasal spray Place 1 spray into the nose daily as needed for congestion. 01/05/17   [provider]  multivitamin [facility]) per tablet Take 1 tablet by mouth daily.      [provider]  Omega-3 Fatty Acids (FISH OIL PO) Take 1 capsule by mouth daily. 1200 mg daily    [provider]  PATADAY 0.2 % SOLN Place 1 drop into both eyes daily.  04/27/16   [provider]  potassium chloride (K-DUR) 10 MEQ tablet Take 1 tablet (10 mEq total) by mouth daily. Please keep 02/12/17 Appt for further refills 02/12/17   Nahser, Wonda Cheng, MD  propranolol (INDERAL) 10 MG tablet Take 10 mg by  mouth daily as needed.    [provider]  pyridOXINE (VITAMIN B-6) 100 MG tablet Take 100 mg by mouth 2 (two) times daily.    [provider]  ranitidine (ZANTAC) 150 MG tablet Take 75 mg by mouth daily.    [provider]  triamterene-hydrochlorothiazide (MAXZIDE) 75-50 MG per tablet Take 1 tablet by mouth daily. Patient takes 1/2 tablet daily.    [provider]    Family History Family History  Problem Relation Age of Onset  . Emphysema Father   . Atrial fibrillation Mother        has pacemaker  . Diabetes Mother   . Hypertension Mother   . Other Brother        bulbar palsy    Social History Social History   Tobacco Use  . Smoking status: Never Smoker  . Smokeless tobacco: Never Used  Substance Use Topics  . Alcohol use: No  . Drug use: No     Allergies   Erythromycin; Plavix [clopidogrel bisulfate]; and Sulfonamide derivatives   Review of Systems Review of Systems  Constitutional:       Per HPI, otherwise negative  HENT:       Per HPI, otherwise negative  Respiratory:       Per HPI, otherwise negative  Cardiovascular:       Per HPI, otherwise negative  Gastrointestinal: Negative for vomiting.  Endocrine:       Negative aside from HPI  Genitourinary:       Neg aside from HPI   Musculoskeletal:       Per HPI, otherwise negative  Skin: Negative.   Neurological: Negative for syncope.     Physical Exam Updated Vital Signs BP (!) 161/90 (BP Location: Left Wrist)   Pulse 77   Temp 99.3 F (37.4 C) (Oral)   Resp 18   Ht 5\' 8"  (1.727 m)   Wt 133.4 kg (294 lb)   LMP 07/22/2010   SpO2 98%   BMI 44.70 kg/m   Physical Exam  Constitutional: She is oriented to person, place, and time. She appears well-developed and well-nourished. No distress.  Obese elderly female awake and alert speaking clearly, but in visible discomfort from likely knee injury  HENT:  Head: Normocephalic and atraumatic.  Eyes: Conjunctivae and  EOM are normal.  Cardiovascular: Normal rate and regular rhythm.  Pulmonary/Chest: Effort normal and breath sounds normal. No stridor. No respiratory distress.  Abdominal: She exhibits no distension.  Musculoskeletal: She exhibits no edema.       Right  ankle: Normal.       Legs:      Feet:  Neurological: She is alert and oriented to person, place, and time. No cranial nerve deficit.  Skin: Skin is warm and dry.  Psychiatric: She has a normal mood and affect.  Nursing note and vitals reviewed.    ED Treatments / Results  Labs (all labs ordered are listed, but only abnormal results are displayed) Labs Reviewed  BASIC METABOLIC PANEL - Abnormal; Notable for the following components:      Result Value   Potassium 2.9 (*)    Glucose, Bld 145 (*)    BUN 22 (*)    All other components within normal limits  CBC WITH DIFFERENTIAL/PLATELET - Abnormal; Notable for the following components:   WBC 14.9 (*)    Hemoglobin 15.6 (*)    HCT 46.1 (*)    Neutro Abs 11.6 (*)    Monocytes Absolute 1.2 (*)    All other components within normal limits  PROTIME-INR    Radiology Dg Knee 2 Views Right  Result Date: 05/17/2017 CLINICAL DATA:  Status post reduction EXAM: RIGHT KNEE - 1-2 VIEW COMPARISON:  Films from earlier in the same day FINDINGS: Previously seen knee dislocation has been reduced. No acute fractures noted. No prosthetic abnormality is seen. IMPRESSION: Reduction of previously seen knee dislocation Electronically Signed   By: Inez Catalina M.D.   On: 05/17/2017 15:42   Dg Knee Right Port  Result Date: 05/17/2017 CLINICAL DATA:  Right knee pain after fall today EXAM: PORTABLE RIGHT KNEE - 1-2 VIEW COMPARISON:  None. FINDINGS: Status post right total knee arthroplasty. Anterior dislocation of the right tibia at the right knee joint. Large right knee joint effusion. Suggestion of cortical irregularity at the proximal right fibula, cannot exclude a nondisplaced fracture in this location.  No additional discrete osseous fracture. No evidence of hardware fracture or loosening. No suspicious focal osseous lesion. Small superior right patellar enthesophyte. Diffuse right knee soft tissue swelling. IMPRESSION: 1. Anterior right knee dislocation with large right knee joint effusion. 2. Possible nondisplaced proximal right fibula fracture. 3. Right total knee arthroplasty without evidence of hardware fracture or loosening. Electronically Signed   By: Ilona Sorrel M.D.   On: 05/17/2017 13:59    Procedures .Sedation Date/Time: 05/17/2017 3:19 PM Performed by: Carmin Muskrat, MD Authorized by: Carmin Muskrat, MD   Consent:    Consent obtained:  Verbal and emergent situation   Consent given by:  Patient   Risks discussed:  Inadequate sedation, nausea, vomiting, respiratory compromise necessitating ventilatory assistance and intubation, prolonged sedation necessitating reversal and prolonged hypoxia resulting in organ damage   Alternatives discussed:  Analgesia without sedation Universal protocol:    Procedure explained and questions answered to patient or proxy's satisfaction: yes     Relevant documents present and verified: yes     Imaging studies available: yes     Immediately prior to procedure a time out was called: yes   Indications:    Procedure performed:  Fracture reduction   Procedure necessitating sedation performed by:  Different physician   Intended level of sedation:  Moderate (conscious sedation) Pre-sedation assessment:    Time since last food or drink:  3   ASA classification: class 2 - patient with mild systemic disease     Neck mobility: normal     Mouth opening:  3 or more finger widths   Thyromental distance:  3 finger widths   Mallampati score:  II -  soft palate, uvula, fauces visible   Pre-sedation assessments completed and reviewed: airway patency, cardiovascular function, hydration status, mental status, nausea/vomiting, pain level and respiratory  function     Pre-sedation assessment completed:  05/17/2017 2:10 PM Immediate pre-procedure details:    Reassessment: Patient reassessed immediately prior to procedure     Reviewed: vital signs, relevant labs/tests and NPO status     Verified: bag valve mask available, emergency equipment available, intubation equipment available, IV patency confirmed and oxygen available   Procedure details (see MAR for exact dosages):    Preoxygenation:  Nasal cannula   Sedation:  Propofol   Analgesia:  Hydromorphone   Intra-procedure monitoring:  Blood pressure monitoring, cardiac monitor, continuous pulse oximetry, continuous capnometry, frequent LOC assessments and frequent vital sign checks   Intra-procedure events: none     Total Provider sedation time (minutes):  20 Post-procedure details:    Post-sedation assessment completed:  05/17/2017 3:21 PM   Attendance: Constant attendance by certified staff until patient recovered     Recovery: Patient returned to pre-procedure baseline     Post-sedation assessments completed and reviewed: airway patency, cardiovascular function, hydration status, mental status, nausea/vomiting, pain level and respiratory function     Patient is stable for discharge or admission: yes     Patient tolerance:  Tolerated well, no immediate complications Comments:     Reduction of knee dislocation performed by Dr. Stann Mainland.  We then placed the patient in a knee immobilizer.    (including critical care time)  Medications Ordered in ED Medications  HYDROmorphone (DILAUDID) injection 1 mg (not administered)  ondansetron (ZOFRAN) injection 4 mg (not administered)     Initial Impression / Assessment and Plan / ED Course  I have reviewed the triage vital signs and the nursing notes.  Pertinent labs & imaging results that were available during my care of the patient were reviewed by me and considered in my medical decision making (see chart for details).  2:13 PM Patient  aware of x-ray results. She continues to have appreciable posterior tibial pulse, less appreciable dorsalis pedis pulse. She moves the toes freely, but states that her foot is feeling slightly numb.  Patient tolerated reduction well, with no complications. Post reduction x-ray reassuring. Patient is awaiting CT angiography. She continues to have appreciable pulses, but given concern for her description of numbness, imaging indicated. Patient will be admitted by our orthopedic team for further evaluation and management.  4:56 PM I discussed the patient's CT angiography with our radiologist. With concern for popliteal dissection, ultrasound will be ordered. There is reconstituted flow distally but given concern for the vascular injury, additional imaging ordered.  This obese female with history of right knee arthroplasty presents after hyperextension injury with obvious deformity and was found to have dislocation. Patient has some numbness in her foot, but no appreciable distal pulses. After successful reduction with our orthopedic colleagues, the patient had CT angiography which demonstrated dissection of her popliteal artery. Patient required admission for further evaluation and management.  Final Clinical Impressions(s) / ED Diagnoses  Fall, initial encounter Dislocation, right knee Popliteal artery dissection   Carmin Muskrat, MD 05/17/17 1650  CRITICAL CARE Performed by: Carmin Muskrat Total critical care time: 35 minutes Critical care time was exclusive of separately billable procedures and treating other patients. Critical care was necessary to treat or prevent imminent or life-threatening deterioration. Critical care was time spent personally by me on the following activities: development of treatment plan with patient and/or surrogate as well  as nursing, discussions with consultants, evaluation of patient's response to treatment, examination of patient, obtaining history  from patient or surrogate, ordering and performing treatments and interventions, ordering and review of laboratory studies, ordering and review of radiographic studies, pulse oximetry and re-evaluation of patient's condition.     Carmin Muskrat, MD 05/17/17 1701  NEXT DAY ADDENDUM: After I spoke with the vascular team about the patient's popliteal artery dissection, Dr. Donzetta Matters, Dr. Stann Mainland and our ED team facilitated transfer to [facility] for admission with both ortho management and vascular eval.    Carmin Muskrat, MD 05/18/17 [phone]

## 2017-05-18 ENCOUNTER — Inpatient Hospital Stay (HOSPITAL_COMMUNITY): Payer: Medicare Other

## 2017-05-18 ENCOUNTER — Encounter (HOSPITAL_COMMUNITY): Payer: Self-pay | Admitting: Vascular Surgery

## 2017-05-18 ENCOUNTER — Other Ambulatory Visit: Payer: Self-pay

## 2017-05-18 ENCOUNTER — Encounter (HOSPITAL_COMMUNITY): Payer: Medicare Other

## 2017-05-18 DIAGNOSIS — S85001D Unspecified injury of popliteal artery, right leg, subsequent encounter: Secondary | ICD-10-CM

## 2017-05-18 LAB — COMPREHENSIVE METABOLIC PANEL
ALT: 23 U/L (ref 14–54)
AST: 31 U/L (ref 15–41)
Albumin: 3.5 g/dL (ref 3.5–5.0)
Alkaline Phosphatase: 71 U/L (ref 38–126)
Anion gap: 12 (ref 5–15)
BUN: 21 mg/dL — ABNORMAL HIGH (ref 6–20)
CHLORIDE: 102 mmol/L (ref 101–111)
CO2: 26 mmol/L (ref 22–32)
Calcium: 9 mg/dL (ref 8.9–10.3)
Creatinine, Ser: 0.81 mg/dL (ref 0.44–1.00)
Glucose, Bld: 171 mg/dL — ABNORMAL HIGH (ref 65–99)
POTASSIUM: 3.6 mmol/L (ref 3.5–5.1)
SODIUM: 140 mmol/L (ref 135–145)
Total Bilirubin: 1 mg/dL (ref 0.3–1.2)
Total Protein: 5.9 g/dL — ABNORMAL LOW (ref 6.5–8.1)

## 2017-05-18 LAB — GLUCOSE, CAPILLARY
GLUCOSE-CAPILLARY: 179 mg/dL — AB (ref 65–99)
GLUCOSE-CAPILLARY: 193 mg/dL — AB (ref 65–99)
GLUCOSE-CAPILLARY: 151 mg/dL — AB (ref 65–99)
GLUCOSE-CAPILLARY: 162 mg/dL — AB (ref 65–99)
Glucose-Capillary: 178 mg/dL — ABNORMAL HIGH (ref 65–99)
Glucose-Capillary: 157 mg/dL — ABNORMAL HIGH (ref 65–99)

## 2017-05-18 LAB — CBC
HEMATOCRIT: 42.9 % (ref 36.0–46.0)
HEMOGLOBIN: 13.6 g/dL (ref 12.0–15.0)
MCH: 29.8 pg (ref 26.0–34.0)
MCHC: 31.7 g/dL (ref 30.0–36.0)
MCV: 93.9 fL (ref 78.0–100.0)
Platelets: 248 10*3/uL (ref 150–400)
RBC: 4.57 MIL/uL (ref 3.87–5.11)
RDW: 14.6 % (ref 11.5–15.5)
WBC: 17 10*3/uL — AB (ref 4.0–10.5)

## 2017-05-18 LAB — PROTIME-INR
INR: 1.13
Prothrombin Time: 14.4 seconds (ref 11.4–15.2)

## 2017-05-18 MED ORDER — ACETAMINOPHEN 650 MG RE SUPP: 325.0000 mg | RECTAL | Status: DC | PRN

## 2017-05-18 MED ORDER — CEFUROXIME SODIUM 1.5 G IV SOLR
1.5000 g | Freq: Two times a day (BID) | INTRAVENOUS | Status: AC
  Administered 2017-05-18 (×2): 1.5 g via INTRAVENOUS
  Filled 2017-05-18 (×2): qty 1.5

## 2017-05-18 MED ORDER — INSULIN ASPART 100 UNIT/ML ~~LOC~~ SOLN
0.0000 [IU] | Freq: Three times a day (TID) | SUBCUTANEOUS | Status: DC
  Administered 2017-05-18: 3 [IU] via SUBCUTANEOUS
  Administered 2017-05-18: 2 [IU] via SUBCUTANEOUS
  Administered 2017-05-19: 3 [IU] via SUBCUTANEOUS
  Administered 2017-05-19: 2 [IU] via SUBCUTANEOUS
  Administered 2017-05-19: 3 [IU] via SUBCUTANEOUS
  Administered 2017-05-20 – 2017-05-22 (×4): 2 [IU] via SUBCUTANEOUS

## 2017-05-18 MED ORDER — POTASSIUM CHLORIDE CRYS ER 20 MEQ PO TBCR: 20.0000 meq | EXTENDED_RELEASE_TABLET | Freq: Every day | ORAL | Status: DC | PRN

## 2017-05-18 MED ORDER — FAMOTIDINE 20 MG PO TABS
10.0000 mg | ORAL_TABLET | Freq: Every day | ORAL | Status: DC
  Administered 2017-05-18 – 2017-05-21 (×4): 10 mg via ORAL
  Filled 2017-05-18 (×4): qty 1

## 2017-05-18 MED ORDER — SODIUM CHLORIDE 0.9 % IV SOLN: 500.0000 mL | Freq: Once | INTRAVENOUS | Status: DC | PRN

## 2017-05-18 MED ORDER — POLYETHYLENE GLYCOL 3350 17 G PO PACK: 17.0000 g | PACK | Freq: Every day | ORAL | Status: DC | PRN

## 2017-05-18 MED ORDER — HYDRALAZINE HCL 20 MG/ML IJ SOLN
5.0000 mg | INTRAMUSCULAR | Status: DC | PRN
Start: 2017-05-18 — End: 2017-05-22

## 2017-05-18 MED ORDER — SODIUM CHLORIDE 0.9 % IV SOLN
INTRAVENOUS | Status: DC
  Administered 2017-05-18: 02:00:00 via INTRAVENOUS

## 2017-05-18 MED ORDER — MAGNESIUM SULFATE 2 GM/50ML IV SOLN
2.0000 g | Freq: Every day | INTRAVENOUS | Status: DC | PRN
  Filled 2017-05-18: qty 50

## 2017-05-18 MED ORDER — ACETAMINOPHEN 325 MG PO TABS: 325.0000 mg | ORAL_TABLET | ORAL | Status: DC | PRN

## 2017-05-18 MED ORDER — HYDROMORPHONE HCL 1 MG/ML IJ SOLN
0.5000 mg | INTRAMUSCULAR | Status: DC | PRN
  Administered 2017-05-18: 0.5 mg via INTRAVENOUS
  Filled 2017-05-18: qty 0.5

## 2017-05-18 MED ORDER — ALUM & MAG HYDROXIDE-SIMETH 200-200-20 MG/5ML PO SUSP: 15.0000 mL | ORAL | Status: DC | PRN

## 2017-05-18 MED ORDER — PANTOPRAZOLE SODIUM 40 MG PO TBEC
40.0000 mg | DELAYED_RELEASE_TABLET | Freq: Every day | ORAL | Status: DC
  Administered 2017-05-18 – 2017-05-22 (×5): 40 mg via ORAL
  Filled 2017-05-18 (×5): qty 1

## 2017-05-18 MED ORDER — ONDANSETRON HCL 4 MG/2ML IJ SOLN: 4.0000 mg | Freq: Four times a day (QID) | INTRAMUSCULAR | Status: DC | PRN

## 2017-05-18 MED ORDER — DOCUSATE SODIUM 100 MG PO CAPS
100.0000 mg | ORAL_CAPSULE | Freq: Every day | ORAL | Status: DC
  Administered 2017-05-18 – 2017-05-22 (×5): 100 mg via ORAL
  Filled 2017-05-18 (×5): qty 1

## 2017-05-18 MED ORDER — DILTIAZEM HCL ER COATED BEADS 240 MG PO CP24
240.0000 mg | ORAL_CAPSULE | Freq: Every day | ORAL | Status: DC
  Administered 2017-05-18 – 2017-05-22 (×5): 240 mg via ORAL
  Filled 2017-05-18 (×5): qty 1

## 2017-05-18 MED ORDER — LABETALOL HCL 5 MG/ML IV SOLN: 10.0000 mg | INTRAVENOUS | Status: DC | PRN

## 2017-05-18 MED ORDER — PHENOL 1.4 % MT LIQD: 1.0000 | OROMUCOSAL | Status: DC | PRN

## 2017-05-18 MED ORDER — METOPROLOL TARTRATE 5 MG/5ML IV SOLN: 2.0000 mg | INTRAVENOUS | Status: DC | PRN

## 2017-05-18 MED ORDER — OXYCODONE-ACETAMINOPHEN 5-325 MG PO TABS
1.0000 | ORAL_TABLET | ORAL | Status: DC | PRN
  Administered 2017-05-18 – 2017-05-22 (×12): 1 via ORAL
  Filled 2017-05-18 (×4): qty 1
  Filled 2017-05-18 (×2): qty 2
  Filled 2017-05-18: qty 1
  Filled 2017-05-18: qty 2
  Filled 2017-05-18 (×4): qty 1

## 2017-05-18 MED ORDER — GUAIFENESIN-DM 100-10 MG/5ML PO SYRP: 15.0000 mL | ORAL_SOLUTION | ORAL | Status: DC | PRN

## 2017-05-18 MED ORDER — MORPHINE SULFATE (PF) 4 MG/ML IV SOLN: 4.0000 mg | INTRAVENOUS | Status: DC | PRN

## 2017-05-18 NOTE — Plan of Care (Signed)
  Safety: Ability to remain free from injury will improve 05/18/2017 0254 - Progressing by Lesle Reek, RN

## 2017-05-18 NOTE — Anesthesia Postprocedure Evaluation (Signed)
Anesthesia Post Note  Patient: [name]  Procedure(s) Performed: BYPASS GRAFT ABOVE KNEE POPLITEAL TO BELOW KNEE POPLITEAL USING NONREVERSED RIGHT GREATER SAPHENOUS VEIN (Right ) RIGHT GREATER SAPHENOUS VEIN HARVEST (Right )     Patient location during evaluation: PACU Anesthesia Type: General Level of consciousness: awake and alert Pain management: pain level controlled Vital Signs Assessment: post-procedure vital signs reviewed and stable Respiratory status: spontaneous breathing, nonlabored ventilation, respiratory function stable and patient connected to nasal cannula oxygen Cardiovascular status: blood pressure returned to baseline and stable Postop Assessment: no apparent nausea or vomiting Anesthetic complications: no    Last Vitals:  Vitals:   05/18/17 0030 05/18/17 0043  BP: (!) 114/57 (!) 127/58  Pulse: 68 70  Resp: 15 17  Temp:  (!) 36.4 C  SpO2: 95% 95%    Last Pain:  Vitals:   05/18/17 0030  TempSrc:   PainSc: 2                  Makenna Macaluso,W. EDMOND

## 2017-05-18 NOTE — Progress Notes (Signed)
ABI completed: WNL. Landry Mellow, RDMS, RVT

## 2017-05-18 NOTE — Progress Notes (Signed)
   Subjective:  Patient reports pain as mild.  She states that she is having some burning dysesthesias in her right foot.  However she denies any numbness at this time and motor is working as well.  She has mild knee pain.  She is wearing the knee immobilizer.  She has yet to have physical or occupational therapy.  Objective:   VITALS:   Vitals:   05/18/17 1300 05/18/17 1500 05/18/17 1630 05/18/17 1930  BP: (!) 91/57 103/70 132/71 114/63  Pulse:      Resp: 18 19 20 13   Temp:   98.3 F (36.8 C) 99 F (37.2 C)  TempSrc:   Oral Oral  SpO2: 94% 96% 95% 95%  Weight:      Height:        Right knee:  Knee immobilizer in place.  Calf is soft and nontender.  There is a Jackson-Pratt drain in place with minimal serosanguineous drainage.  At the foot she does have motor intact as well as a 2+ dorsalis pedis and posterior tibialis pulse.  She endorses sensation intact to light touch throughout the foot as well.  She does have some hypersensitivity.  No pain with passive stretch or any signs of compartment syndrome.  Lab Results  Component Value Date   WBC 17.0 (H) 05/18/2017   HGB 13.6 05/18/2017   HCT 42.9 05/18/2017   MCV 93.9 05/18/2017   PLT 248 05/18/2017   BMET    Component Value Date/Time   NA 140 05/18/2017 0433   NA 143 02/10/2017 1010   K 3.6 05/18/2017 0433   CL 102 05/18/2017 0433   CO2 26 05/18/2017 0433   GLUCOSE 171 (H) 05/18/2017 0433   BUN 21 (H) 05/18/2017 0433   BUN 20 02/10/2017 1010   CREATININE 0.81 05/18/2017 0433   CREATININE 0.79 01/21/2016 0947   CALCIUM 9.0 05/18/2017 0433   GFRNONAA >60 05/18/2017 0433   GFRAA >60 05/18/2017 0433     Assessment/Plan: 1 Day Post-Op   Principal Problem:   Popliteal artery injury Active Problems:   PAF (paroxysmal atrial fibrillation) ([facility])   HTN (hypertension)   Knee dislocation   Hypokalemia   Leukocytosis   Injury of right popliteal artery   Up with therapy Weight-bear as tolerated to the right lower  extremity, in the knee immobilizer.  She is okay for knee range of motion while not weightbearing in the bed with actively and passively. No signs of compartment syndrome at this time. We will plan for follow-up in 2-3 weeks with Dr. Adriana Mccallum For the right knee.   [name] 05/18/2017, 9:43 PM   Robin Rile, MD [phone]

## 2017-05-18 NOTE — Evaluation (Signed)
Occupational Therapy Evaluation Patient Details Name: [name] MRN: [id number] DOB: Mar 22, 1946 Today's Date: 05/18/2017    History of Present Illness Pt is a 71 y.o. female admitted 05/17/17 post-fall in snow resulting in R knee hyperextension, dislocation of R TKA, and popliteal artery occlusion. Now s/p RLE popliteal artery bypass. Peritnent PMH includes bilat TKA, L hip arthritis, a-fib, HTN, DM, obesity.     Clinical Impression   PTA, pt was independent with ADL with occasional use of cane for functional mobility when hip was bothering her. Pt currently limited by R LE pain and requiring min assist for stand-pivot toilet transfers with RW, max assist for LB ADL, and mod assist for toileting hygiene. Pt becoming lightheaded during transfer associated with drop in blood pressure as detailed in general ADL comments section and RN notified. Dizziness/lightheadedness improved once seated and recliner in recliner chair and BP increasing. Pt would benefit from continued OT services while admitted to improve independence and safety with ADL and functional mobility prior to returning home with assistance from family. Recommend home health OT follow-up post-acute D/C. Will continue to follow while admitted.     Follow Up Recommendations  Home health OT;Supervision/Assistance - 24 hour    Equipment Recommendations  3 in 1 bedside commode    Recommendations for Other Services       Precautions / Restrictions Precautions Precautions: Fall Required Braces or Orthoses: Knee Immobilizer - Right Knee Immobilizer - Right: Other (comment)(no orders provided; mobility in KI per MD note) Restrictions Weight Bearing Restrictions: Yes RLE Weight Bearing: Weight bearing as tolerated Other Position/Activity Restrictions: WBAT per MD Stann Mainland) note      Mobility Bed Mobility Overal bed mobility: Needs Assistance Bed Mobility: Supine to Sit     Supine to sit: Min assist     General bed  mobility comments: Assist to manage R LE to EOB and scoot hips forward.   Transfers Overall transfer level: Needs assistance Equipment used: Rolling walker (2 wheeled) Transfers: Sit to/from [facility] Sit to Stand: Min assist;From elevated surface Stand pivot transfers: Min assist;From elevated surface       General transfer comment: Pt requesting elevated surface to stand. Reports that her bed is high at home. VC's for safe hand placement.     Balance Overall balance assessment: Needs assistance Sitting-balance support: No upper extremity supported;Feet unsupported;Feet supported Sitting balance-Leahy Scale: Fair Sitting balance - Comments: Supervision for safety.    Standing balance support: No upper extremity supported;Bilateral upper extremity supported;During functional activity Standing balance-Leahy Scale: Poor Standing balance comment: Relies on B UE support for safety.                            ADL either performed or assessed with clinical judgement   ADL Overall ADL's : Needs assistance/impaired Eating/Feeding: Set up;Sitting   Grooming: Set up;Sitting   Upper Body Bathing: Sitting;Set up   Lower Body Bathing: Sit to/from stand;Maximal assistance   Upper Body Dressing : Set up;Sitting   Lower Body Dressing: Maximal assistance;Sit to/from stand   Toilet Transfer: Minimal assistance;Stand-pivot;RW Toilet Transfer Details (indicate cue type and reason): Simulated from bed to chair. Taking a few pivotal steps Toileting- Clothing Manipulation and Hygiene: Moderate assistance;Sit to/from stand         General ADL Comments: Pt limited by pain and lightheadedness this session. BP in supine 99/61 and increased to 132/71 seated at EOB. Pt reporting lightheadedness a few minutes after  standing and BP 102/55 once seated in chair. Up to 107/57 after 2 minutes reclined in chair.      Vision Patient Visual Report: No change from  baseline Vision Assessment?: No apparent visual deficits     Perception     Praxis      Pertinent Vitals/Pain Pain Assessment: Faces Faces Pain Scale: Hurts even more Pain Location: RLE Pain Descriptors / Indicators: Burning;Sharp Pain Intervention(s): Limited activity within patient's tolerance;Monitored during session;Repositioned     Hand Dominance     Extremity/Trunk Assessment Upper Extremity Assessment Upper Extremity Assessment: Overall WFL for tasks assessed   Lower Extremity Assessment Lower Extremity Assessment: Defer to PT evaluation       Communication Communication Communication: No difficulties   Cognition Arousal/Alertness: Awake/alert Behavior During Therapy: WFL for tasks assessed/performed Overall Cognitive Status: Within Functional Limits for tasks assessed                                     General Comments  Son present during session    Exercises     Shoulder Instructions      Home Living Family/patient expects to be discharged to:: Private residence Living Arrangements: Spouse/significant other Available Help at Discharge: Family;Available 24 hours/day Type of Home: House Home Access: Ramped entrance     Home Layout: One level(2 steps into bedroom)     Bathroom Shower/Tub: Tub/shower unit;Walk-in shower   Bathroom Toilet: Standard     Home Equipment: Clinical cytogeneticist - 2 wheels;Cane - single point;Crutches          Prior Functioning/Environment Level of Independence: Independent        Comments: Intermittent use of SPC secondary to hip arthritis. Enjoys Industrial/product designer; recently built ramp into home (initially for her dog but she uses ramp as well)        OT Problem List: Decreased strength;Decreased activity tolerance;Impaired balance (sitting and/or standing);Decreased safety awareness;Decreased knowledge of use of DME or AE;Decreased knowledge of precautions;Pain      OT  Treatment/Interventions: Self-care/ADL training;Therapeutic exercise;Energy conservation;DME and/or AE instruction;Therapeutic activities;Patient/family education;Balance training    OT Goals(Current goals can be found in the care plan section) Acute Rehab OT Goals Patient Stated Goal: go home soon OT Goal Formulation: With patient/family Time For Goal Achievement: 06/01/17 Potential to Achieve Goals: Good ADL Goals Pt Will Perform Grooming: with min guard assist;standing Pt Will Perform Lower Body Dressing: with min assist;sit to/from stand Pt Will Transfer to Toilet: with min guard assist;ambulating(elevated toilet with toilet riser) Pt Will Perform Toileting - Clothing Manipulation and hygiene: with min assist;sit to/from stand  OT Frequency: Min 2X/week   Barriers to D/C:            Co-evaluation              AM-PAC PT "6 Clicks" Daily Activity     Outcome Measure Help from another person eating meals?: A Little Help from another person taking care of personal grooming?: A Little Help from another person toileting, which includes using toliet, bedpan, or urinal?: A Lot Help from another person bathing (including washing, rinsing, drying)?: A Lot Help from another person to put on and taking off regular upper body clothing?: A Little Help from another person to put on and taking off regular lower body clothing?: A Lot 6 Click Score: 15   End of Session Equipment Utilized During Treatment: Gait belt;Rolling walker Nurse Communication: Mobility  status(BP; recommend +2 assist back to bed from low recliner)  Activity Tolerance: Patient tolerated treatment well Patient left: in chair;with call bell/phone within reach;with family/visitor present  OT Visit Diagnosis: Other abnormalities of gait and mobility (R26.89);Pain;Muscle weakness (generalized) (M62.81) Pain - Right/Left: Right Pain - part of body: Knee                Time: 3818-4037 OT Time Calculation (min): 36  min Charges:  OT General Charges $OT Visit: 1 Visit OT Evaluation $OT Eval Moderate Complexity: 1 Mod OT Treatments $Self Care/Home Management : 8-22 mins G-Codes:     Norman Herrlich, MS OTR/L  Pager: [place] A [name] 05/18/2017, 5:44 PM

## 2017-05-18 NOTE — Care Management Note (Signed)
Case Management Note  Patient Details  Name: [name] MRN: [id number] Date of Birth: 1946/03/09  Subjective/Objective:   Status post        Right above knee to below knee popliteal artery bypass          Action/Plan: PTA Pt lived at home with spouse. PCP noted.  Per PT recommendation for HH/PT, Pt will need orders prior to discharge.  NCM will continue to follow for transition discharge home needs.  Expected Discharge Date:  (unknown)               Expected Discharge Plan:  [name]  In-House Referral:  NA  Discharge planning Services  CM Consult  Post Acute Care Choice:    Choice offered to:     DME Arranged:    DME Agency:     HH Arranged:    HH Agency:     Status of Service:  In process, will continue to follow  If discussed at Long Length of Stay Meetings, dates discussed:    Additional Comments:  Kristen Cardinal, RN 05/18/2017, 2:27 PM

## 2017-05-18 NOTE — Progress Notes (Signed)
[facility] Progress Note  Patient: [name] PZW:[id number]   PCP: Leonard Downing, MD DOB: [birth year]   DOA: 05/17/2017   DOS: 05/18/2017   Date of Service: the patient was seen and examined on 05/18/2017  Subjective: Feeling better, does not have any pain.  Sensation is back in the leg with patient occasionally feeling burning.  No nausea no vomiting no chest pain abdominal pain no dizziness no lightheadedness.  Brief hospital course: Pt. with PMH of bilateral knee replacement, A. fib on Eliquis, HTN, obesity; admitted on 05/17/2017, presented with complaint of fall, was found to have dislocation of the right knee as well as critical limb ischemia.  Underwent closed reduction of the right knee followed by right popliteal artery bypass.  Has a knee immobilizer Currently further plan is follow-up on PT and orthopedics recommendation.  Assessment and Plan: 1.  Right knee dislocation. History of bilateral knee replacement. Mechanical fall. Presented with a fall with severe pain in the right knee and hematoma. X-ray in the ER was showing right knee dislocation. Orthopedic was consulted, Dr. Stann Mainland performed closed knee reduction. Patient has a knee immobilizer. To be worn all the time and weightbearing as tolerated per orthopedic PT recommends home with home health for now.  2.  Right popliteal artery occlusion. Due to severe hematoma and dislocation as well as focal dissection of the right popliteal artery seen on CT angiogram. Vascular surgery was consulted. Eliquis on hold. Underwent right above-knee to below-knee popliteal artery bypass grafting. Patient still has a JP drain. Vascular recommending to initiate patient on anticoagulation for DVT prophylaxis but hold off on therapeutic anticoagulation. ABI appears to be normal postoperatively. Appreciate input.  3.  Chronic A. fib.  CHA2DS2-VASc Score 4 On Eliquis at home. Currently rate controlled. On Cardizem at  home, continue the same for now. Regulation is on hold due to severe hematoma as well as acute postoperative blood loss anemia. Monitor recommendation from vascular surgery.  4.  Acute postoperative blood loss anemia. Expected blood loss from baseline hemoglobin of 14-15-13.6. Monitor for now.  5.  Leukocytosis. Hyperglycemia Does not appear to be having any infection. Likely reaction to stress. Monitor for now.  6.  Type 2 diabetes mellitus. Currently on sliding scale insulin. Hemoglobin A1c 5.7 appears to be well controlled at home. Hypoglycemia here is more likely secondary to acute stress and pain.  7.  Hypokalemia. Replaced. Monitor.  Diet: Carb modified diet DVT Prophylaxis: subcutaneous Heparin  Advance goals of care discussion: full code  Family Communication: family was present at bedside, at the time of interview. The pt provided permission to discuss medical plan with the family. Opportunity was given to ask question and all questions were answered satisfactorily.   Disposition:  Discharge to home.  Consultants: vascular surgery, orthopedic Procedures: Closed reduction of the right knee. Right elbow need to below-knee popliteal artery bypass with non-reversed greater saphenous vein  Antibiotics: Anti-infectives (From admission, onward)   Start     Dose/Rate Route Frequency Ordered Stop   05/18/17 0200  cefUROXime (ZINACEF) 1.5 g in dextrose 5 % 50 mL IVPB     1.5 g 100 mL/hr over 30 Minutes Intravenous Every 12 hours 05/18/17 0030 05/19/17 0159       Objective: Physical Exam: Vitals:   05/18/17 0409 05/18/17 0800 05/18/17 1300 05/18/17 1500  BP: 131/74 115/67 (!) 91/57 103/70  Pulse: 77     Resp: 17 16 18 19   Temp: 98.3 F (36.8 C)  TempSrc: Oral     SpO2: 98% 95% 94% 96%  Weight:      Height:        Intake/Output Summary (Last 24 hours) at 05/18/2017 1611 Last data filed at 05/18/2017 1336 Gross per 24 hour  Intake 2180 ml  Output 1377  ml  Net 803 ml   Filed Weights   05/17/17 1318 05/18/17 0000 05/18/17 0100  Weight: 133.4 kg (294 lb) 133.4 kg (294 lb 1.5 oz) (!) 138 kg (304 lb 3.8 oz)   General: Alert, Awake and Oriented to Time, Place and Person. Appear in moderate distress, affect appropriate Eyes: PERRL, Conjunctiva normal ENT: Oral Mucosa clear moist. Neck: no JVD, no Abnormal Mass Or lumps Cardiovascular: S1 and S2 Present, no Murmur, Peripheral Pulses Present Respiratory: normal respiratory effort, Bilateral Air entry equal and Decreased, no use of accessory muscle, Clear to Auscultation, no Crackles, no wheezes Abdomen: Bowel Sound present, Soft and no tenderness, no hernia Skin: no redness, no Rash, no induration Extremities: no Pedal edema, no calf tenderness Neurologic: Grossly no focal neuro deficit. Bilaterally Equal motor strength  Data Reviewed: CBC: Recent Labs  Lab 05/17/17 1357 05/18/17 0433  WBC 14.9* 17.0*  NEUTROABS 11.6*  --   HGB 15.6* 13.6  HCT 46.1* 42.9  MCV 90.2 93.9  PLT 234 875   Basic Metabolic Panel: Recent Labs  Lab 05/17/17 1357 05/18/17 0433  NA 141 140  K 2.9* 3.6  CL 103 102  CO2 28 26  GLUCOSE 145* 171*  BUN 22* 21*  CREATININE 0.72 0.81  CALCIUM 9.5 9.0  MG 1.9  --     Liver Function Tests: Recent Labs  Lab 05/18/17 0433  AST 31  ALT 23  ALKPHOS 71  BILITOT 1.0  PROT 5.9*  ALBUMIN 3.5   No results for input(s): LIPASE, AMYLASE in the last 168 hours. No results for input(s): AMMONIA in the last 168 hours. Coagulation Profile: Recent Labs  Lab 05/17/17 1357 05/18/17 0433  INR 1.04 1.13   Cardiac Enzymes: No results for input(s): CKTOTAL, CKMB, CKMBINDEX, TROPONINI in the last 168 hours. BNP (last 3 results) No results for input(s): PROBNP in the last 8760 hours. CBG: Recent Labs  Lab 05/18/17 0006 05/18/17 0621 05/18/17 0850 05/18/17 1124  GLUCAP 157* 179* 178* 193*   Studies: Ct Angio Low Extrem Right W &/or Wo Contrast  Result  Date: 05/17/2017 CLINICAL DATA:  Recent right knee dislocation EXAM: CT ANGIOGRAPHY OF THE RIGHT LOWEREXTREMITY TECHNIQUE: Multidetector CT imaging of the right lower extremity was performed using the standard protocol during bolus administration of intravenous contrast. Multiplanar CT image reconstructions and MIPs were obtained to evaluate the vascular anatomy. CONTRAST:  143mL ISOVUE-370 IOPAMIDOL (ISOVUE-370) INJECTION 76% COMPARISON:  None FINDINGS: Right external iliac artery and common femoral artery are widely patent. The femoral bifurcation is widely patent. Superficial femoral artery is patent. The proximal popliteal artery is patent although there is evidence of abrupt occlusion just above the level of the right knee prosthesis. Minimal reconstitution is noted of the popliteal artery posterior to the knee with more distal reconstitution of the distal popliteal artery and popliteal trifurcation. The timing of the contrast bolus limits evaluation at the level of the ankle although three-vessel runoff to the level of the mid calf is noted. No acute bony abnormality is noted. Right knee prosthesis is seen which somewhat limits the evaluation of the popliteal artery although the level of occlusion occurs above the prosthesis. Subcutaneous edema is noted related  to the recent injury. A small joint effusion is noted. Review of the MIP images confirms the above findings. IMPRESSION: There is occlusion of the popliteal artery likely related to focal dissection secondary to the known knee dislocation. Reconstitution of the distal popliteal artery is noted with three-vessel runoff to the mid to distal lower extremity. The timing of the contrast bolus precludes evaluation of the vasculature at the level of the ankle. Duplex evaluation of the popliteal artery but may also be helpful to delineate the length of the arterial abnormality. Critical Value/emergent results were called by telephone at the time of  interpretation on 05/17/2017 at 4:51 pm to Dr. Carmin Muskrat , who verbally acknowledged these results. Electronically Signed   By: Inez Catalina M.D.   On: 05/17/2017 16:54    Scheduled Meds: . diltiazem  240 mg Oral Daily  . docusate sodium  100 mg Oral Daily  . famotidine  10 mg Oral QHS  .  HYDROmorphone (DILAUDID) injection  0.5 mg Intravenous Once  . insulin aspart  0-15 Units Subcutaneous TID WC  . pantoprazole  40 mg Oral Daily   Continuous Infusions: . sodium chloride    . sodium chloride    . sodium chloride Stopped (05/18/17 1144)  . 0.9 % NaCl with KCl 20 mEq / L    . cefUROXime (ZINACEF)  IV Stopped (05/18/17 0215)  . magnesium sulfate 1 - 4 g bolus IVPB     PRN Meds: sodium chloride, acetaminophen **OR** acetaminophen, alum & mag hydroxide-simeth, guaiFENesin-dextromethorphan, hydrALAZINE, HYDROmorphone (DILAUDID) injection, labetalol, magnesium sulfate 1 - 4 g bolus IVPB, metoprolol tartrate, ondansetron, oxyCODONE-acetaminophen, phenol, polyethylene glycol, potassium chloride  Time spent: 35 minutes  Author: Berle Mull, MD [facility] Pager: [phone] 05/18/2017 4:11 PM  If 7PM-7AM, please contact night-coverage at [email or web address], password [facility]

## 2017-05-18 NOTE — Transfer of Care (Signed)
Immediate Anesthesia Transfer of Care Note  Patient: [name]  Procedure(s) Performed: BYPASS GRAFT ABOVE KNEE POPLITEAL TO BELOW KNEE POPLITEAL USING NONREVERSED RIGHT GREATER SAPHENOUS VEIN (Right ) RIGHT GREATER SAPHENOUS VEIN HARVEST (Right )  Patient Location: PACU  Anesthesia Type:General  Level of Consciousness: drowsy  Airway & Oxygen Therapy: Patient Spontanous Breathing and Patient connected to face mask oxygen  Post-op Assessment: Report given to RN and Post -op Vital signs reviewed and stable  Post vital signs: Reviewed and stable  Last Vitals:  Vitals:   05/17/17 1900 05/17/17 1915  BP: 124/64 (!) 117/59  Pulse: 73 69  Resp: 15 17  Temp:    SpO2: 100% 98%    Last Pain:  Vitals:   05/17/17 1422  TempSrc:   PainSc: 7          Complications: No apparent anesthesia complications

## 2017-05-18 NOTE — Progress Notes (Addendum)
  Progress Note    05/18/2017 7:03 AM 1 Day Post-Op  Subjective:  Says she's having a hot flash; she's having some burning in her leg just above the ankle.    Afebrile HR 50's-70's NSR 035'K-093'G systolic 18% [id number]  Vitals:   05/18/17 0100 05/18/17 0409  BP: 135/61 131/74  Pulse: 64 77  Resp: 17 17  Temp: 98.3 F (36.8 C) 98.3 F (36.8 C)  SpO2: 96% 98%    Physical Exam: Cardiac:  regular Lungs:  Non labored Incisions:  Incisions are covered and there is minimal bloody drainage on these Extremities:  Right foot with easily palpable AT pulse;  Motor and sensory are in tact.    CBC    Component Value Date/Time   WBC 17.0 (H) 05/18/2017 0433   RBC 4.57 05/18/2017 0433   HGB 13.6 05/18/2017 0433   HCT 42.9 05/18/2017 0433   PLT 248 05/18/2017 0433   MCV 93.9 05/18/2017 0433   MCH 29.8 05/18/2017 0433   MCHC 31.7 05/18/2017 0433   RDW 14.6 05/18/2017 0433   LYMPHSABS 2.0 05/17/2017 1357   MONOABS 1.2 (H) 05/17/2017 1357   EOSABS 0.1 05/17/2017 1357   BASOSABS 0.0 05/17/2017 1357    BMET    Component Value Date/Time   NA 140 05/18/2017 0433   NA 143 02/10/2017 1010   K 3.6 05/18/2017 0433   CL 102 05/18/2017 0433   CO2 26 05/18/2017 0433   GLUCOSE 171 (H) 05/18/2017 0433   BUN 21 (H) 05/18/2017 0433   BUN 20 02/10/2017 1010   CREATININE 0.81 05/18/2017 0433   CREATININE 0.79 01/21/2016 0947   CALCIUM 9.0 05/18/2017 0433   GFRNONAA >60 05/18/2017 0433   GFRAA >60 05/18/2017 0433    INR    Component Value Date/Time   INR 1.13 05/18/2017 0433     Intake/Output Summary (Last 24 hours) at 05/18/2017 0703 Last data filed at 05/18/2017 0636 Gross per 24 hour  Intake 1700 ml  Output 1127 ml  Net 573 ml     Assessment:  71 y.o. female is s/p:  1.  Harvest of right greater saphenous vein 2.  Right above knee to below knee popliteal artery bypass with non-reversed greater saphenous vein  1 Day Post-Op  Plan: -pt with easily palpable right AT  pulse; motor and sensory are in tact. -calf is soft -mild acute surgical blood loss anemia-tolerating -will leave bandage in place due to wearing leg spint -DVT prophylaxis:  Will d/w Dr. Donzetta Matters when to start either SQ heparin or re-start Eliquis -drain with minimal drainage -start SSI -transfer to telemetry   Leontine Locket, PA-C [facility] [phone] 05/18/2017 7:03 AM   I have independently interviewed and examined patient and agree with PA assessment and plan above. Activity per ortho. Ok for subq heparin and will need full dose anticoagulation likely tomorrow if h/h stable. Leave drain another day.  Marliss Buttacavoli C. Donzetta Matters, MD [facility] [place] Office: [phone] Pager: [phone]

## 2017-05-18 NOTE — Evaluation (Signed)
Physical Therapy Evaluation Patient Details Name: [name] MRN: [id number] DOB: June 25, 1945 Today's Date: 05/18/2017   History of Present Illness  Pt is a 71 y.o. female admitted 05/17/17 post-fall in snow resulting in R knee hyperextension, dislocation of R TKA, and popliteal artery occlusion. Now s/p RLE popliteal artery bypass. Peritnent PMH includes bilat TKA, L hip arthritis, a-fib, HTN, DM, obesity.      Clinical Impression  Pt presents with pain and an overall decrease in functional mobility secondary to above. PTA, pt indep with mobility and lives with husband. Today, pt required minA for bed mobility and supervision for sitting EOB. C/o increased nausea and light-headedness upon sitting, declining further mobility secondary to this and wanting to eat breakfast (see BP values below). Feel that once this is under control, pt will progress well. SpO2 remained >92% on RA. Pt would benefit from continued acute PT services to maximize functional mobility and independence prior to d/c with HHPT services.   Resting (supine) BP 116/77 Seated BP 82/49 Return to supine BP 106/59 ~2 min supine 117/68    Follow Up Recommendations Home health PT    Equipment Recommendations  None recommended by PT(owns DME)    Recommendations for Other Services OT consult     Precautions / Restrictions Precautions Precautions: Fall Required Braces or Orthoses: Knee Immobilizer - Right Knee Immobilizer - Right: Other (comment)(No orders provided) Restrictions Weight Bearing Restrictions: No      Mobility  Bed Mobility Overal bed mobility: Needs Assistance Bed Mobility: Supine to Sit;Sit to Supine     Supine to sit: Min assist Sit to supine: Mod assist   General bed mobility comments: MinA to assist RLE to EOB. ModA to assist pt back into bed secondary to c/o nausea and dizziness. Pt able to scoot up in bed with minA  Transfers Overall transfer level: Needs assistance                General transfer comment: Pt unwilling to attempt standing secondary to c/o nausea and dizziness  Ambulation/Gait                Stairs            Wheelchair Mobility    Modified Rankin (Stroke Patients Only)       Balance Overall balance assessment: Needs assistance Sitting-balance support: No upper extremity supported;Feet unsupported;Feet supported Sitting balance-Leahy Scale: Fair Sitting balance - Comments: Able to sit EOB >5 min with supervision while monitoring BP                                     Pertinent Vitals/Pain Pain Assessment: Faces Faces Pain Scale: Hurts little more Pain Location: RLE Pain Descriptors / Indicators: Burning;Sharp Pain Intervention(s): Monitored during session    Home Living Family/patient expects to be discharged to:: Private residence Living Arrangements: Spouse/significant other Available Help at Discharge: Family;Available 24 hours/day Type of Home: House Home Access: Ramped entrance     Home Layout: One level;Other (Comment)(2 steps into bedroom) Home Equipment: Shower seat;Walker - 2 wheels;Cane - single point;Crutches      Prior Function Level of Independence: Independent         Comments: Intermittent use of SPC secondary to hip arthritis. Enjoys Industrial/product designer; recently built ramp into home     Hand Dominance        Extremity/Trunk Assessment   Upper Extremity Assessment Upper Extremity  Assessment: Overall WFL for tasks assessed    Lower Extremity Assessment Lower Extremity Assessment: RLE deficits/detail RLE Deficits / Details: RLE grossly 2/5 (most likely limited by pain) RLE: Unable to fully assess due to immobilization;Unable to fully assess due to pain       Communication   Communication: No difficulties  Cognition Arousal/Alertness: Awake/alert Behavior During Therapy: WFL for tasks assessed/performed Overall Cognitive Status: Within Functional Limits for tasks  assessed                                        General Comments General comments (skin integrity, edema, etc.): Husband present during session    Exercises     Assessment/Plan    PT Assessment Patient needs continued PT services  PT Problem List Decreased strength;Decreased range of motion;Decreased activity tolerance;Decreased balance;Decreased mobility;Decreased knowledge of use of DME;Decreased knowledge of precautions;Pain       PT Treatment Interventions DME instruction;Gait training;Stair training;Functional mobility training;Therapeutic activities;Therapeutic exercise;Balance training;Patient/family education    PT Goals (Current goals can be found in the Care Plan section)  Acute Rehab PT Goals Patient Stated Goal: Eat breakfast PT Goal Formulation: With patient Time For Goal Achievement: 06/01/17 Potential to Achieve Goals: Good    Frequency Min 3X/week   Barriers to discharge        Co-evaluation               AM-PAC PT "6 Clicks" Daily Activity  Outcome Measure Difficulty turning over in bed (including adjusting bedclothes, sheets and blankets)?: A Little Difficulty moving from lying on back to sitting on the side of the bed? : A Little Difficulty sitting down on and standing up from a chair with arms (e.g., wheelchair, bedside commode, etc,.)?: Unable Help needed moving to and from a bed to chair (including a wheelchair)?: A Little Help needed walking in hospital room?: A Little Help needed climbing 3-5 steps with a railing? : A Little 6 Click Score: 16    End of Session Equipment Utilized During Treatment: Gait belt Activity Tolerance: Treatment limited secondary to medical complications (Comment)(Nausea, dizziness) Patient left: in bed;with call bell/phone within reach;with family/visitor present Nurse Communication: Mobility status PT Visit Diagnosis: Other abnormalities of gait and mobility (R26.89);Pain Pain - Right/Left:  Right Pain - part of body: Leg    Time: 3300-7622 PT Time Calculation (min) (ACUTE ONLY): 30 min   Charges:   PT Evaluation $PT Eval Moderate Complexity: 1 Mod PT Treatments $Therapeutic Activity: 8-22 mins   PT G Codes:       Mabeline Caras, PT, DPT Acute Rehab Services  Pager: [place] 05/18/2017, 9:13 AM

## 2017-05-19 DIAGNOSIS — S85001A Unspecified injury of popliteal artery, right leg, initial encounter: Secondary | ICD-10-CM

## 2017-05-19 DIAGNOSIS — S83104A Unspecified dislocation of right knee, initial encounter: Secondary | ICD-10-CM

## 2017-05-19 DIAGNOSIS — E876 Hypokalemia: Secondary | ICD-10-CM

## 2017-05-19 DIAGNOSIS — S85001D Unspecified injury of popliteal artery, right leg, subsequent encounter: Secondary | ICD-10-CM

## 2017-05-19 DIAGNOSIS — I480 Paroxysmal atrial fibrillation: Secondary | ICD-10-CM

## 2017-05-19 DIAGNOSIS — I10 Essential (primary) hypertension: Secondary | ICD-10-CM

## 2017-05-19 LAB — TYPE AND SCREEN
ABO/RH(D): A POS
ANTIBODY SCREEN: NEGATIVE
UNIT DIVISION: 0
UNIT DIVISION: 0
UNIT DIVISION: 0
Unit division: 0

## 2017-05-19 LAB — GLUCOSE, CAPILLARY
GLUCOSE-CAPILLARY: 188 mg/dL — AB (ref 65–99)
GLUCOSE-CAPILLARY: 144 mg/dL — AB (ref 65–99)
GLUCOSE-CAPILLARY: 151 mg/dL — AB (ref 65–99)
Glucose-Capillary: 162 mg/dL — ABNORMAL HIGH (ref 65–99)

## 2017-05-19 LAB — BPAM RBC
BLOOD PRODUCT EXPIRATION DATE: 201901022359
BLOOD PRODUCT EXPIRATION DATE: 201901052359
Blood Product Expiration Date: 201901022359
Blood Product Expiration Date: 201901022359
UNIT TYPE AND RH: 6200
UNIT TYPE AND RH: 6200
Unit Type and Rh: 6200
Unit Type and Rh: 6200

## 2017-05-19 NOTE — Progress Notes (Addendum)
  Progress Note    05/19/2017 7:19 AM 2 Days Post-Op  Subjective:  Says she has got up yesterday  Tm 99 now afebrile HR 70's-80's NSR 557'D-220'U systolic 54% RA  Vitals:   05/18/17 2358 05/19/17 0454  BP: 131/70 (!) 124/57  Pulse: 87 78  Resp: 17 16  Temp: 98.8 F (37.1 C) 98.4 F (36.9 C)  SpO2: 95% 91%    Physical Exam: Lungs:  Non labored Incisions:  Both incisions are clean and dry Extremities:  +palpable right DP pulse   CBC    Component Value Date/Time   WBC 17.0 (H) 05/18/2017 0433   RBC 4.57 05/18/2017 0433   HGB 13.6 05/18/2017 0433   HCT 42.9 05/18/2017 0433   PLT 248 05/18/2017 0433   MCV 93.9 05/18/2017 0433   MCH 29.8 05/18/2017 0433   MCHC 31.7 05/18/2017 0433   RDW 14.6 05/18/2017 0433   LYMPHSABS 2.0 05/17/2017 1357   MONOABS 1.2 (H) 05/17/2017 1357   EOSABS 0.1 05/17/2017 1357   BASOSABS 0.0 05/17/2017 1357    BMET    Component Value Date/Time   NA 140 05/18/2017 0433   NA 143 02/10/2017 1010   K 3.6 05/18/2017 0433   CL 102 05/18/2017 0433   CO2 26 05/18/2017 0433   GLUCOSE 171 (H) 05/18/2017 0433   BUN 21 (H) 05/18/2017 0433   BUN 20 02/10/2017 1010   CREATININE 0.81 05/18/2017 0433   CREATININE 0.79 01/21/2016 0947   CALCIUM 9.0 05/18/2017 0433   GFRNONAA >60 05/18/2017 0433   GFRAA >60 05/18/2017 0433    INR    Component Value Date/Time   INR 1.13 05/18/2017 0433     Intake/Output Summary (Last 24 hours) at 05/19/2017 0719 Last data filed at 05/19/2017 0458 Gross per 24 hour  Intake 720 ml  Output 1925 ml  Net -1205 ml     Assessment:  71 y.o. female is s/p:  1.  Harvest of right greater saphenous vein 2.  Right above knee to below knee popliteal artery bypass with non-reversed greater saphenous vein  2 Days Post-Op  Plan: -pt doing well with palpable right DP pulse.   -incisions are clean and dry -drain output 25cc/24hr-will dc drain today -DVT prophylaxis:  Most likely will restart Eliquis today-will  d/w Dr. Gregor Hams, PA-C [facility] [phone] 05/19/2017 7:19 AM   I have independently interviewed and examined patient and agree with PA assessment and plan above. Ok for restart eliquis tonight. Drain can come out today.  Aevah Stansbery C. Donzetta Matters, MD [facility] [place] Office: [phone] Pager: [phone]

## 2017-05-19 NOTE — Progress Notes (Signed)
JP drain removed from right leg, per order. Pt tolerated well. Pressure dressing applied. Will continue to monitor.    Grant Fontana BSN, RN

## 2017-05-19 NOTE — Progress Notes (Signed)
PROGRESS NOTE  [name] DVV:[id number] DOB: 09-20-1945 DOA: 05/17/2017 PCP: Leonard Downing, MD  HPI/Recap of past 24 hours: Pt. with PMH of bilateral knee replacement, A. fib on Eliquis, HTN, obesity; admitted on 05/17/2017, presented with complaint of fall, was found to have dislocation of the right knee as well as critical limb ischemia.  Underwent closed reduction of the right knee followed by right popliteal artery bypass. POD #2 Post harvest of right greater saphenous vein and right above knee to below knee popliteal artery bypass with non-reversed greater saphenous vein.  Pt seen and examined at her bedside. She reports no pain when she does not move. Admits to some indigestion that has now resolved. Vascular surgery following.   Assessment/Plan: Principal Problem:   Popliteal artery injury Active Problems:   PAF (paroxysmal atrial fibrillation) (HCC)   HTN (hypertension)   Knee dislocation   Hypokalemia   Leukocytosis   Injury of right popliteal artery  1.  Right knee dislocation post mechanical fall, poa. -History of bilateral knee replacement. -Orthopedic surgery following- closed knee reduction. -has a knee immobilizer: To be worn all the time and weightbearing as tolerated per orthopedic -PT recommends SNF  2.  Right popliteal artery occlusio POD #2 post harvest of right greater saphenous vein and right above knee to below knee popliteal artery bypass with non-reversed greater saphenous vein. -Due to severe hematoma and dislocation as well as focal dissection of the right popliteal artery seen on CT angiogram. -Vascular surgery following -JP drain with minimal output will be removed today  3.  Chronic A. fib. -CHA2DS2-VASc Score 4 -rate controlled -On Eliquis at home. -on Cardizem at home  4.  Acute postoperative blood loss anemia. -stable -hg13.6 -cbc am  5.  Leukocytosis possibly reactive from recent surgery. Wbc 17 from 14 -no sign of  active infective process -procalcitonin, cbc am  6. Hyperglycemia -persistent -a1C 5.7 (05/17/17) -ISS and hypoglycemia protocol -bmp am  7.  Hypokalemia, resolved. -BMP am   Code Status: Full  Family Communication: No family members at bedside  Disposition Plan: Will stay another midnight to continue current management   Consultants:  Vascular surgery  Procedures: POD #2 post harvest of right greater saphenous vein and right above knee to below knee popliteal artery bypass with non-reversed greater saphenous vein.    Antimicrobials:  None  DVT prophylaxis:  SCDs   Objective: Vitals:   05/18/17 1930 05/18/17 2358 05/19/17 0454 05/19/17 0843  BP: 114/63 131/70 (!) 124/57 (!) 132/55  Pulse:  87 78 70  Resp: 13 17 16 14   Temp: 99 F (37.2 C) 98.8 F (37.1 C) 98.4 F (36.9 C) 98.4 F (36.9 C)  TempSrc: Oral Oral Oral Oral  SpO2: 95% 95% 91% 96%  Weight:      Height:        Intake/Output Summary (Last 24 hours) at 05/19/2017 0950 Last data filed at 05/19/2017 0458 Gross per 24 hour  Intake 720 ml  Output 1925 ml  Net -1205 ml   Filed Weights   05/17/17 1318 05/18/17 0000 05/18/17 0100  Weight: 133.4 kg (294 lb) 133.4 kg (294 lb 1.5 oz) (!) 138 kg (304 lb 3.8 oz)    Exam:   General:  71 yo CF WD wN NAD  Cardiovascular: RRR no rubs or gallops  Respiratory: CTA no wheezes or rhonchi  Abdomen: obese NBS x4   Musculoskeletal: Non focal moves all limbs  Skin: no open lesions noted  Psychiatry: Mood is  appropriate for condition and setting   Data Reviewed: CBC: Recent Labs  Lab 05/17/17 1357 05/18/17 0433  WBC 14.9* 17.0*  NEUTROABS 11.6*  --   HGB 15.6* 13.6  HCT 46.1* 42.9  MCV 90.2 93.9  PLT 234 833   Basic Metabolic Panel: Recent Labs  Lab 05/17/17 1357 05/18/17 0433  NA 141 140  K 2.9* 3.6  CL 103 102  CO2 28 26  GLUCOSE 145* 171*  BUN 22* 21*  CREATININE 0.72 0.81  CALCIUM 9.5 9.0  MG 1.9  --     GFR: Estimated Creatinine Clearance: 95.4 mL/min (by C-G formula based on SCr of 0.81 mg/dL). Liver Function Tests: Recent Labs  Lab 05/18/17 0433  AST 31  ALT 23  ALKPHOS 71  BILITOT 1.0  PROT 5.9*  ALBUMIN 3.5   No results for input(s): LIPASE, AMYLASE in the last 168 hours. No results for input(s): AMMONIA in the last 168 hours. Coagulation Profile: Recent Labs  Lab 05/17/17 1357 05/18/17 0433  INR 1.04 1.13   Cardiac Enzymes: No results for input(s): CKTOTAL, CKMB, CKMBINDEX, TROPONINI in the last 168 hours. BNP (last 3 results) No results for input(s): PROBNP in the last 8760 hours. HbA1C: Recent Labs    05/17/17 1357  HGBA1C 5.7*   CBG: Recent Labs  Lab 05/18/17 0850 05/18/17 1124 05/18/17 1658 05/18/17 2037 05/19/17 0603  GLUCAP 178* 193* 151* 162* 188*   Lipid Profile: No results for input(s): CHOL, HDL, LDLCALC, TRIG, CHOLHDL, LDLDIRECT in the last 72 hours. Thyroid Function Tests: No results for input(s): TSH, T4TOTAL, FREET4, T3FREE, THYROIDAB in the last 72 hours. Anemia Panel: No results for input(s): VITAMINB12, FOLATE, FERRITIN, TIBC, IRON, RETICCTPCT in the last 72 hours. Urine analysis:    Component Value Date/Time   COLORURINE YELLOW 06/17/2009 Reading 06/17/2009 1308   LABSPEC 1.020 06/17/2009 1308   PHURINE 7.0 06/17/2009 1308   GLUCOSEU NEGATIVE 06/17/2009 1308   HGBUR NEGATIVE 06/17/2009 1308   BILIRUBINUR n 01/25/2013 1113   KETONESUR NEGATIVE 06/17/2009 1308   PROTEINUR n 01/25/2013 1113   PROTEINUR NEGATIVE 06/17/2009 1308   UROBILINOGEN negative 01/25/2013 1113   UROBILINOGEN 0.2 06/17/2009 1308   NITRITE n 01/25/2013 1113   NITRITE NEGATIVE 06/17/2009 1308   LEUKOCYTESUR Negative 01/25/2013 1113   Sepsis Labs: @LABRCNTIP (procalcitonin:4,lacticidven:4)  )No results found for this or any previous visit (from the past 240 hour(s)).    Studies: No results found.  Scheduled Meds: . diltiazem  240  mg Oral Daily  . docusate sodium  100 mg Oral Daily  . famotidine  10 mg Oral QHS  .  HYDROmorphone (DILAUDID) injection  0.5 mg Intravenous Once  . insulin aspart  0-15 Units Subcutaneous TID WC  . pantoprazole  40 mg Oral Daily    Continuous Infusions: . sodium chloride    . sodium chloride    . sodium chloride Stopped (05/18/17 1144)  . 0.9 % NaCl with KCl 20 mEq / L    . magnesium sulfate 1 - 4 g bolus IVPB       LOS: 2 days     Kayleen Memos, MD [facility] Pager [phone]  If 7PM-7AM, please contact night-coverage [email or web address] Password [email or web address] 05/19/2017, 9:50 AM

## 2017-05-19 NOTE — Progress Notes (Signed)
Physical Therapy Treatment Patient Details Name: LEDONNA DORMER MRN: [id number] DOB: 17-Apr-1946 Today's Date: 05/19/2017    History of Present Illness Pt is a 71 y.o. female admitted 05/17/17 post-fall in snow resulting in R knee hyperextension, dislocation of R TKA, and popliteal artery occlusion. Now s/p RLE popliteal artery bypass. Peritnent PMH includes bilat TKA, L hip arthritis, a-fib, HTN, DM, obesity.      PT Comments    Pt nervous about transferring to chair this AM as she reported requiring max A +4 to return to bed yesterday. Offered to elevate recliner chair with blankets to assist pt with return to bed and advised pt self monitor activity tolerance and call nurse to return to bed before pt fatigues. Pt agreed to transfer from bed>BSC>recliner. She required min A for all mobilities. Increased time and effort to negotiate R LE underneath body. Based on pt progress, updating d/c planning for SNF instead of HHPT. Plan discussed with PT. Plan to progress ambulation in nest session. Will continue to follow to maximize safety with mobility and functional independence.   Follow Up Recommendations  SNF     Equipment Recommendations  None recommended by PT(owns DME)    Recommendations for Other Services OT consult     Precautions / Restrictions Precautions Precautions: Fall Required Braces or Orthoses: Knee Immobilizer - Right Knee Immobilizer - Right: Other (comment)(no orders provided; mobility in [place] per MD note) Restrictions Weight Bearing Restrictions: Yes RLE Weight Bearing: Weight bearing as tolerated Other Position/Activity Restrictions: WBAT per MD Stann Mainland) note    Mobility  Bed Mobility Overal bed mobility: Needs Assistance Bed Mobility: Supine to Sit     Supine to sit: Min assist     General bed mobility comments: Assistanct for R LE management to EOB and elevate trunk. VC for technqiue  Transfers Overall transfer level: Needs assistance Equipment used:  Rolling walker (2 wheeled) Transfers: Sit to/from [facility] Sit to Stand: Min assist;From elevated surface Stand pivot transfers: Min assist;From elevated surface(x2)       General transfer comment: Pt requires increased time and effort to rise into standing in order to move R LE underneath. Once standing overall steady with bil UE support. Transfered from bed>BSC>recliner  Ambulation/Gait             General Gait Details: few shuffling steps from [facility] to recliner   Stairs            Wheelchair Mobility    Modified Rankin (Stroke Patients Only)       Balance Overall balance assessment: Needs assistance Sitting-balance support: No upper extremity supported;Feet unsupported;Feet supported Sitting balance-Leahy Scale: Fair Sitting balance - Comments: Supervision for safety.    Standing balance support: Bilateral upper extremity supported;During functional activity Standing balance-Leahy Scale: Poor Standing balance comment: Relies on B UE support for safety.                             Cognition Arousal/Alertness: Awake/alert Behavior During Therapy: WFL for tasks assessed/performed Overall Cognitive Status: Within Functional Limits for tasks assessed                                        Exercises      General Comments        Pertinent Vitals/Pain Pain Assessment: Faces Faces Pain Scale: Hurts even more Pain  Location: RLE Pain Descriptors / Indicators: Burning;Sharp Pain Intervention(s): Monitored during session;Limited activity within patient's tolerance;Repositioned;Premedicated before session    Home Living                      Prior Function            PT Goals (current goals can now be found in the care plan section) Acute Rehab PT Goals Patient Stated Goal: go home soon PT Goal Formulation: With patient Time For Goal Achievement: 06/01/17 Potential to Achieve Goals: Good Progress  towards PT goals: Progressing toward goals    Frequency    Min 3X/week      PT Plan Discharge plan needs to be updated    Co-evaluation              AM-PAC PT "6 Clicks" Daily Activity  Outcome Measure  Difficulty turning over in bed (including adjusting bedclothes, sheets and blankets)?: Unable Difficulty moving from lying on back to sitting on the side of the bed? : Unable Difficulty sitting down on and standing up from a chair with arms (e.g., wheelchair, bedside commode, etc,.)?: Unable Help needed moving to and from a bed to chair (including a wheelchair)?: A Little Help needed walking in hospital room?: A Little Help needed climbing 3-5 steps with a railing? : Total 6 Click Score: 10    End of Session Equipment Utilized During Treatment: Gait belt Activity Tolerance: Patient tolerated treatment well Patient left: in chair;with call bell/phone within reach Nurse Communication: Mobility status PT Visit Diagnosis: Other abnormalities of gait and mobility (R26.89);Pain Pain - Right/Left: Right Pain - part of body: Leg     Time: 9449-6759 PT Time Calculation (min) (ACUTE ONLY): 24 min  Charges:  $Therapeutic Activity: 8-22 mins                    G Codes:       Benjiman Core, [place] Pager [phone] Acute Rehab   Allena Katz 05/19/2017, 11:39 AM

## 2017-05-20 LAB — HEMOGLOBIN AND HEMATOCRIT, BLOOD
HCT: 29.8 % — ABNORMAL LOW (ref 36.0–46.0)
Hemoglobin: 9.6 g/dL — ABNORMAL LOW (ref 12.0–15.0)

## 2017-05-20 LAB — GLUCOSE, CAPILLARY
GLUCOSE-CAPILLARY: 106 mg/dL — AB (ref 65–99)
Glucose-Capillary: 126 mg/dL — ABNORMAL HIGH (ref 65–99)
Glucose-Capillary: 142 mg/dL — ABNORMAL HIGH (ref 65–99)
Glucose-Capillary: 118 mg/dL — ABNORMAL HIGH (ref 65–99)

## 2017-05-20 LAB — CBC
HCT: 29.2 % — ABNORMAL LOW (ref 36.0–46.0)
Hemoglobin: 9.7 g/dL — ABNORMAL LOW (ref 12.0–15.0)
MCH: 30.5 pg (ref 26.0–34.0)
MCHC: 33.2 g/dL (ref 30.0–36.0)
MCV: 91.8 fL (ref 78.0–100.0)
PLATELETS: 173 10*3/uL (ref 150–400)
RBC: 3.18 MIL/uL — AB (ref 3.87–5.11)
RDW: 14.6 % (ref 11.5–15.5)
WBC: 13.4 10*3/uL — AB (ref 4.0–10.5)

## 2017-05-20 LAB — BASIC METABOLIC PANEL
ANION GAP: 4 — AB (ref 5–15)
BUN: 21 mg/dL — ABNORMAL HIGH (ref 6–20)
CO2: 31 mmol/L (ref 22–32)
Calcium: 8.4 mg/dL — ABNORMAL LOW (ref 8.9–10.3)
Chloride: 103 mmol/L (ref 101–111)
Creatinine, Ser: 0.74 mg/dL (ref 0.44–1.00)
GFR calc Af Amer: >60 mL/min (ref >60)
Glucose, Bld: 131 mg/dL — ABNORMAL HIGH (ref 65–99)
POTASSIUM: 3.9 mmol/L (ref 3.5–5.1)
SODIUM: 138 mmol/L (ref 135–145)

## 2017-05-20 MED ORDER — APIXABAN 5 MG PO TABS
5.0000 mg | ORAL_TABLET | Freq: Two times a day (BID) | ORAL | Status: DC
  Administered 2017-05-20 – 2017-05-22 (×5): 5 mg via ORAL
  Filled 2017-05-20 (×5): qty 1

## 2017-05-20 NOTE — Progress Notes (Signed)
Occupational Therapy Treatment Patient Details Name: [name] MRN: [id number] DOB: Jan 07, 1946 Today's Date: 05/20/2017    History of present illness Pt is a 71 y.o. female admitted 05/17/17 post-fall in snow resulting in R knee hyperextension, dislocation of R TKA, and popliteal artery occlusion. Now s/p RLE popliteal artery bypass. Peritnent PMH includes bilat TKA, L hip arthritis, a-fib, HTN, DM, obesity.     OT comments  Pt making progress with functional goals, however pt limited by R LE pain, is very anxious and nervous about mobility and if her husband can provide level of care that she requires at this time. OT will continue to follow acutely  Follow Up Recommendations  SNF    Equipment Recommendations  3 in 1 bedside commode    Recommendations for Other Services      Precautions / Restrictions Precautions Precautions: Fall Required Braces or Orthoses: Knee Immobilizer - Right Restrictions Weight Bearing Restrictions: Yes RLE Weight Bearing: Weight bearing as tolerated Other Position/Activity Restrictions: WBAT per MD Stann Mainland) note       Mobility Bed Mobility Overal bed mobility: Needs Assistance Bed Mobility: Supine to Sit;Sit to Supine     Supine to sit: Min assist Sit to supine: Mod assist   General bed mobility comments: Assistanct for R LE management to EOB and back onto bed  Transfers Overall transfer level: Needs assistance Equipment used: Rolling walker (2 wheeled) Transfers: Sit to/from [facility] Sit to Stand: Min assist;From elevated surface Stand pivot transfers: Min assist;From elevated surface       General transfer comment: Pt requires increased time and effort to rise into standing in order to move R LE underneath. Once standing overall steady with bil UE support. Transfered from bed>BSC>bed, declined recliner    Balance Overall balance assessment: Needs assistance Sitting-balance support: No upper extremity  supported;Feet unsupported;Feet supported Sitting balance-Leahy Scale: Fair     Standing balance support: Bilateral upper extremity supported;During functional activity Standing balance-Leahy Scale: Poor                             ADL either performed or assessed with clinical judgement   ADL Overall ADL's : Needs assistance/impaired     Grooming: Wash/dry hands;Wash/dry face;Standing;Minimal assistance;Min guard               Lower Body Dressing: Maximal assistance;Sit to/from stand   Toilet Transfer: Minimal assistance;Stand-pivot;RW;Min guard   Toileting- Clothing Manipulation and Hygiene: Moderate assistance;Sit to/from stand;Minimal assistance       Functional mobility during ADLs: Minimal assistance;Min guard General ADL Comments: pt reports no dizziness, VSS     Vision Baseline Vision/History: Wears glasses Patient Visual Report: No change from baseline     Perception     Praxis      Cognition Arousal/Alertness: Awake/alert Behavior During Therapy: WFL for tasks assessed/performed Overall Cognitive Status: Within Functional Limits for tasks assessed                                          Exercises     Shoulder Instructions       General Comments      Pertinent Vitals/ Pain       Pain Assessment: 0-10 Pain Score: 5  Pain Location: R LE Pain Descriptors / Indicators: Sore Pain Intervention(s): Limited activity within patient's tolerance;Monitored during session;Repositioned;Relaxation  Home Living                                          Prior Functioning/Environment              Frequency  Min 2X/week        Progress Toward Goals  OT Goals(current goals can now be found in the care plan section)  Progress towards OT goals: Progressing toward goals  Acute Rehab OT Goals Patient Stated Goal: go home soon  Plan      Co-evaluation                 AM-PAC PT "6 Clicks"  Daily Activity     Outcome Measure   Help from another person eating meals?: A Little Help from another person taking care of personal grooming?: A Little Help from another person toileting, which includes using toliet, bedpan, or urinal?: A Lot Help from another person bathing (including washing, rinsing, drying)?: A Lot Help from another person to put on and taking off regular upper body clothing?: A Little Help from another person to put on and taking off regular lower body clothing?: A Lot 6 Click Score: 15    End of Session Equipment Utilized During Treatment: Gait belt;Rolling walker;Other (comment)(BSC)  Pain - Right/Left: Right Pain - part of body: Leg   Activity Tolerance Patient tolerated treatment well   Patient Left with call bell/phone within reach;in bed   Nurse Communication      Functional Assessment Tool Used: AM-PAC 6 Clicks Daily Activity   Time: 8264-1583 OT Time Calculation (min): 26 min  Charges: OT G-codes **NOT FOR INPATIENT CLASS** Functional Assessment Tool Used: AM-PAC 6 Clicks Daily Activity OT General Charges $OT Visit: 1 Visit OT Treatments $Self Care/Home Management : 8-22 mins $Therapeutic Activity: 8-22 mins     Britt Bottom 05/20/2017, 12:34 PM

## 2017-05-20 NOTE — Progress Notes (Signed)
PROGRESS NOTE  Robin RODRIGES KWI:[id number] DOB: May 26, 1946 DOA: 05/17/2017 PCP: Leonard Downing, MD  HPI/Recap of past 24 hours: Pt. with PMH of bilateral knee replacement, A. fib on Eliquis, HTN, obesity; admitted on 05/17/2017, presented with complaint of fall, was found to have dislocation of the right knee as well as critical limb ischemia.  Underwent closed reduction of the right knee followed by right popliteal artery bypass. POD #2 Post harvest of right greater saphenous vein and right above knee to below knee popliteal artery bypass with non-reversed greater saphenous vein.  Pt seen and examined at her bedside. She reports no pain when she does not move. Vascular surgery following. Blisters noted on right leg anteriorly and posteriorly. POD #3 post harvest of right greater saphenous vein and right above knee to below knee popliteal artery bypass with non-reversed greater saphenous vein.    Assessment/Plan: Principal Problem:   Popliteal artery injury Active Problems:   PAF (paroxysmal atrial fibrillation) (HCC)   HTN (hypertension)   Knee dislocation   Hypokalemia   Leukocytosis   Injury of right popliteal artery  1.  Post closed knee reduction POD # 3 Right knee dislocation post mechanical fall, poa. -History of bilateral knee replacement. -Orthopedic surgery following- closed knee reduction 05/17/17 -has a knee immobilizer: To be worn all the time and weightbearing as tolerated per orthopedic -PT recommends SNF  2.  Right popliteal artery occlusio POD #3 post harvest of right greater saphenous vein and right above knee to below knee popliteal artery bypass with non-reversed greater saphenous vein. -Due to severe hematoma and dislocation as well as focal dissection of the right popliteal artery seen on CT angiogram. -Vascular surgery following -Blisters on right leg; closely monitor  3.  Chronic A. fib. -CHA2DS2-VASc Score 4 -rate controlled; on  cardizem -Eliquis restarted  4.  Acute postoperative blood loss anemia. -stable -hg 9.7 (13.6) -cbc am  5.  Leukocytosis possibly reactive from recent surgery. -improving wbc 13 from Wbc 17 from 14 -no sign of active infective process -procalcitonin, cbc am  6. Hyperglycemia -improving -a1C 5.7 (05/17/17) -ISS and hypoglycemia protocol -bmp am  7.  Hypokalemia, resolved. -BMP am   Code Status: Full  Family Communication: No family members at bedside  Disposition Plan: Will stay another midnight to continue current management   Consultants:  Vascular surgery  Orthopedic surgery  Procedures: POD #3 post harvest of right greater saphenous vein and right above knee to below knee popliteal artery bypass with non-reversed greater saphenous vein.    Antimicrobials:  None  DVT prophylaxis:  SCDs, eliquis for chronic a-fib   Objective: Vitals:   05/19/17 2340 05/20/17 0330 05/20/17 0332 05/20/17 0758  BP: (!) 103/46 (!) 117/58 (!) 117/58 (!) 124/59  Pulse: 69 65  79  Resp:  18 17 18   Temp: 98.8 F (37.1 C) 98.4 F (36.9 C)  98.8 F (37.1 C)  TempSrc: Oral Oral  Oral  SpO2:  91% 92% 94%  Weight:      Height:        Intake/Output Summary (Last 24 hours) at 05/20/2017 1554 Last data filed at 05/20/2017 0801 Gross per 24 hour  Intake 480 ml  Output 950 ml  Net -470 ml   Filed Weights   05/17/17 1318 05/18/17 0000 05/18/17 0100  Weight: 133.4 kg (294 lb) 133.4 kg (294 lb 1.5 oz) (!) 138 kg (304 lb 3.8 oz)    Exam:   General:  71 yo CF WD wN  NAD  Cardiovascular: IRRR no rubs or gallops  Respiratory: CTA no wheezes or rhonchi  Abdomen: obese NBS x4   Musculoskeletal: Non focal moves all limbs  Skin: no open lesions noted  Psychiatry: Mood is appropriate for condition and setting   Data Reviewed: CBC: Recent Labs  Lab 05/17/17 1357 05/18/17 0433 05/20/17 0255 05/20/17 1316  WBC 14.9* 17.0* 13.4*  --   NEUTROABS 11.6*  --   --    --   HGB 15.6* 13.6 9.7* 9.6*  HCT 46.1* 42.9 29.2* 29.8*  MCV 90.2 93.9 91.8  --   PLT 234 248 173  --    Basic Metabolic Panel: Recent Labs  Lab 05/17/17 1357 05/18/17 0433 05/20/17 0255  NA 141 140 138  K 2.9* 3.6 3.9  CL 103 102 103  CO2 28 26 31   GLUCOSE 145* 171* 131*  BUN 22* 21* 21*  CREATININE 0.72 0.81 0.74  CALCIUM 9.5 9.0 8.4*  MG 1.9  --   --    GFR: Estimated Creatinine Clearance: 96.6 mL/min (by C-G formula based on SCr of 0.74 mg/dL). Liver Function Tests: Recent Labs  Lab 05/18/17 0433  AST 31  ALT 23  ALKPHOS 71  BILITOT 1.0  PROT 5.9*  ALBUMIN 3.5   No results for input(s): LIPASE, AMYLASE in the last 168 hours. No results for input(s): AMMONIA in the last 168 hours. Coagulation Profile: Recent Labs  Lab 05/17/17 1357 05/18/17 0433  INR 1.04 1.13   Cardiac Enzymes: No results for input(s): CKTOTAL, CKMB, CKMBINDEX, TROPONINI in the last 168 hours. BNP (last 3 results) No results for input(s): PROBNP in the last 8760 hours. HbA1C: No results for input(s): HGBA1C in the last 72 hours. CBG: Recent Labs  Lab 05/19/17 1128 05/19/17 1634 05/19/17 2058 05/20/17 0612 05/20/17 1144  GLUCAP 144* 162* 151* 106* 126*   Lipid Profile: No results for input(s): CHOL, HDL, LDLCALC, TRIG, CHOLHDL, LDLDIRECT in the last 72 hours. Thyroid Function Tests: No results for input(s): TSH, T4TOTAL, FREET4, T3FREE, THYROIDAB in the last 72 hours. Anemia Panel: No results for input(s): VITAMINB12, FOLATE, FERRITIN, TIBC, IRON, RETICCTPCT in the last 72 hours. Urine analysis:    Component Value Date/Time   COLORURINE YELLOW 06/17/2009 [place] 06/17/2009 1308   LABSPEC 1.020 06/17/2009 1308   PHURINE 7.0 06/17/2009 1308   GLUCOSEU NEGATIVE 06/17/2009 1308   HGBUR NEGATIVE 06/17/2009 1308   BILIRUBINUR n 01/25/2013 1113   KETONESUR NEGATIVE 06/17/2009 1308   PROTEINUR n 01/25/2013 1113   PROTEINUR NEGATIVE 06/17/2009 1308    UROBILINOGEN negative 01/25/2013 1113   UROBILINOGEN 0.2 06/17/2009 1308   NITRITE n 01/25/2013 1113   NITRITE NEGATIVE 06/17/2009 1308   LEUKOCYTESUR Negative 01/25/2013 1113   Sepsis Labs: @LABRCNTIP (procalcitonin:4,lacticidven:4)  )No results found for this or any previous visit (from the past 240 hour(s)).    Studies: No results found.  Scheduled Meds: . apixaban  5 mg Oral BID  . diltiazem  240 mg Oral Daily  . docusate sodium  100 mg Oral Daily  . famotidine  10 mg Oral QHS  .  HYDROmorphone (DILAUDID) injection  0.5 mg Intravenous Once  . insulin aspart  0-15 Units Subcutaneous TID WC  . pantoprazole  40 mg Oral Daily    Continuous Infusions: . sodium chloride    . sodium chloride    . magnesium sulfate 1 - 4 g bolus IVPB       LOS: 3 days  Kayleen Memos, MD [facility] Pager [phone]  If 7PM-7AM, please contact night-coverage [email or web address] Password [facility] 05/20/2017, 3:53 PM

## 2017-05-20 NOTE — Progress Notes (Addendum)
  Progress Note    05/20/2017 7:41 AM 3 Days Post-Op  Subjective:  No pain in R foot at rest; patient still having pain behind knee when weightbearing.   Vitals:   05/20/17 0330 05/20/17 0332  BP: (!) 117/58 (!) 117/58  Pulse: 65   Resp: 18 17  Temp: 98.4 F (36.9 C)   SpO2: 91% 92%   Physical Exam: Cardiac:  Irregular Lungs:  No increased resp effort Incisions:  Some serous collection on dressing of distal aspect of distal most incision; also some clear, yellowish drainage from proximal aspect of proximal incision; pitting edema of RLE to level of the knee Extremities:  Palpable R DP 1+ Abdomen:  soft Neurologic: A&O  CBC    Component Value Date/Time   WBC 13.4 (H) 05/20/2017 0255   RBC 3.18 (L) 05/20/2017 0255   HGB 9.7 (L) 05/20/2017 0255   HCT 29.2 (L) 05/20/2017 0255   PLT 173 05/20/2017 0255   MCV 91.8 05/20/2017 0255   MCH 30.5 05/20/2017 0255   MCHC 33.2 05/20/2017 0255   RDW 14.6 05/20/2017 0255   LYMPHSABS 2.0 05/17/2017 1357   MONOABS 1.2 (H) 05/17/2017 1357   EOSABS 0.1 05/17/2017 1357   BASOSABS 0.0 05/17/2017 1357    BMET    Component Value Date/Time   NA 138 05/20/2017 0255   NA 143 02/10/2017 1010   K 3.9 05/20/2017 0255   CL 103 05/20/2017 0255   CO2 31 05/20/2017 0255   GLUCOSE 131 (H) 05/20/2017 0255   BUN 21 (H) 05/20/2017 0255   BUN 20 02/10/2017 1010   CREATININE 0.74 05/20/2017 0255   CREATININE 0.79 01/21/2016 0947   CALCIUM 8.4 (L) 05/20/2017 0255   GFRNONAA >60 05/20/2017 0255   GFRAA >60 05/20/2017 0255    INR    Component Value Date/Time   INR 1.13 05/18/2017 0433     Intake/Output Summary (Last 24 hours) at 05/20/2017 0741 Last data filed at 05/20/2017 0335 Gross per 24 hour  Intake 600 ml  Output 350 ml  Net 250 ml     Assessment/Plan:  71 y.o. female is s/p R AK- pop to BK pop bypass with GSV bypass conduit 3 Days Post-Op   Restarted Eliquis Encouraged participation with therapy teams; elevate leg when  sitting Incisions are stable; continue to monitor for dehiscence ABL anemia: recheck CBC in am  Dagoberto Ligas, PA-C [facility] [phone] 05/20/2017 7:41 AM

## 2017-05-21 DIAGNOSIS — D72829 Elevated white blood cell count, unspecified: Secondary | ICD-10-CM

## 2017-05-21 LAB — CBC
HEMATOCRIT: 27.4 % — AB (ref 36.0–46.0)
Hemoglobin: 8.8 g/dL — ABNORMAL LOW (ref 12.0–15.0)
MCH: 29.6 pg (ref 26.0–34.0)
MCHC: 32.1 g/dL (ref 30.0–36.0)
MCV: 92.3 fL (ref 78.0–100.0)
PLATELETS: 178 10*3/uL (ref 150–400)
RBC: 2.97 MIL/uL — ABNORMAL LOW (ref 3.87–5.11)
RDW: 14.7 % (ref 11.5–15.5)
WBC: 10.5 10*3/uL (ref 4.0–10.5)

## 2017-05-21 LAB — PROCALCITONIN: Procalcitonin: <0.1 ng/mL

## 2017-05-21 LAB — BASIC METABOLIC PANEL
ANION GAP: 4 — AB (ref 5–15)
BUN: 17 mg/dL (ref 6–20)
CALCIUM: 8.1 mg/dL — AB (ref 8.9–10.3)
CO2: 30 mmol/L (ref 22–32)
CREATININE: 0.77 mg/dL (ref 0.44–1.00)
Chloride: 105 mmol/L (ref 101–111)
GFR calc Af Amer: >60 mL/min (ref >60)
GLUCOSE: 124 mg/dL — AB (ref 65–99)
Potassium: 4 mmol/L (ref 3.5–5.1)
Sodium: 139 mmol/L (ref 135–145)

## 2017-05-21 LAB — GLUCOSE, CAPILLARY
GLUCOSE-CAPILLARY: 127 mg/dL — AB (ref 65–99)
GLUCOSE-CAPILLARY: 126 mg/dL — AB (ref 65–99)
Glucose-Capillary: 113 mg/dL — ABNORMAL HIGH (ref 65–99)
Glucose-Capillary: 97 mg/dL (ref 65–99)

## 2017-05-21 MED ORDER — OXYCODONE-ACETAMINOPHEN 5-325 MG PO TABS
1.0000 | ORAL_TABLET | Freq: Every day | ORAL | Status: DC
  Administered 2017-05-22: 1 via ORAL
  Filled 2017-05-21 (×2): qty 1

## 2017-05-21 MED ORDER — KETOROLAC TROMETHAMINE 15 MG/ML IJ SOLN: 15.0000 mg | Freq: Once | INTRAMUSCULAR | Status: DC

## 2017-05-21 NOTE — Progress Notes (Addendum)
  Progress Note    05/21/2017 10:08 AM 4 Days Post-Op  Subjective:  Pain with standing in R knee   Vitals:   05/21/17 0359 05/21/17 0400  BP: 117/67 117/67  Pulse:    Resp: 17 15  Temp: 98.5 F (36.9 C)   SpO2: 96%    Physical Exam: Cardiac:  Irregular Lungs:  Clear to auscultation Incisions:  R medial thigh incisions are soft with only mild ecchymosis at proximal aspect of proximal incision; no palpable hematoma or drainage with manipulation of incisions; no bleeding from JP drain site; RLE edematous compared to L Extremities:  Palpable R AT Abdomen:  soft Neurologic: A&O  CBC    Component Value Date/Time   WBC 10.5 05/21/2017 0215   RBC 2.97 (L) 05/21/2017 0215   HGB 8.8 (L) 05/21/2017 0215   HCT 27.4 (L) 05/21/2017 0215   PLT 178 05/21/2017 0215   MCV 92.3 05/21/2017 0215   MCH 29.6 05/21/2017 0215   MCHC 32.1 05/21/2017 0215   RDW 14.7 05/21/2017 0215   LYMPHSABS 2.0 05/17/2017 1357   MONOABS 1.2 (H) 05/17/2017 1357   EOSABS 0.1 05/17/2017 1357   BASOSABS 0.0 05/17/2017 1357    BMET    Component Value Date/Time   NA 139 05/21/2017 0215   NA 143 02/10/2017 1010   K 4.0 05/21/2017 0215   CL 105 05/21/2017 0215   CO2 30 05/21/2017 0215   GLUCOSE 124 (H) 05/21/2017 0215   BUN 17 05/21/2017 0215   BUN 20 02/10/2017 1010   CREATININE 0.77 05/21/2017 0215   CREATININE 0.79 01/21/2016 0947   CALCIUM 8.1 (L) 05/21/2017 0215   GFRNONAA >60 05/21/2017 0215   GFRAA >60 05/21/2017 0215    INR    Component Value Date/Time   INR 1.13 05/18/2017 0433     Intake/Output Summary (Last 24 hours) at 05/21/2017 1008 Last data filed at 05/21/2017 0606 Gross per 24 hour  Intake 480 ml  Output 800 ml  Net -320 ml     Assessment/Plan:  71 y.o. female is s/p R AK- pop to BK pop bypass with GSV conduit  4 Days Post-Op   Continue to monitor peripheral pulses Incisions stable; no sign of continued bleeding or hematoma ABL anemia: check hemoccult; daily  CBC Encouraged ambulation with assistance    Dagoberto Ligas, PA-C [facility] [phone] 05/21/2017 10:08 AM  I have independently interviewed and examined patient and agree with PA assessment and plan above.   Milana Salay C. Donzetta Matters, MD [facility] [place] Office: [phone] Pager: [phone]

## 2017-05-21 NOTE — Progress Notes (Signed)
CSW following patient for discharge plan. CSW spoke with patient and spouse in regards to the possibility of a weekend discharge. Husband picked [facility] for patient to do short term rehab. Patient has bed available once medically cleared by MD.   Rhea Pink, MSW,  [place]

## 2017-05-21 NOTE — Clinical Social Work Note (Signed)
Clinical Social Work Assessment  Patient Details  Name: [name] MRN: [id number] Date of Birth: 1945/08/26  Date of referral:  05/21/17               Reason for consult:  Discharge Planning, Facility Placement                Permission sought to share information with:  Family Supports Permission granted to share information::  Yes, Verbal Permission Granted  Name::     [name]  Agency::  snf  Relationship::  husband  Contact Information:  [phone]  Housing/Transportation Living arrangements for the past 2 months:  [place] of Information:  Patient Patient Interpreter Needed:  None Criminal Activity/Legal Involvement Pertinent to Current Situation/Hospitalization:  No - Comment as needed Significant Relationships:  Spouse, Adult Children Lives with:  Spouse Do you feel safe going back to the place where you live?  Yes Need for family participation in patient care:  No (Coment)  Care giving concerns: Patient had spouse at bedside and his supportive.    Social Worker assessment / plan:  CSW met patient at bedside to discuss discharge needs and to offer support. Patient stated she would prefer to go to CIR but is willing to have CSW do SNF as a backup. Patient stated she is having some anxiety during this stay due to her always being independent and feeling like she's not able to be at that level.Patient wanted to talk to [place] and CSW stated she will come back later when patient is not busy. CSW to follow up with patient once bed is available   Employment status:  Retired Nurse, adult PT Recommendations:  [place] / Referral to community resources:  [place]  Patient/Family's Response to care:  Family appreciates CSW role in care  Patient/Family's Understanding of and Emotional Response to Diagnosis, Current Treatment, and Prognosis:  Patient understands that she will need short  term rehab before returning home  Emotional Assessment Appearance:  Appears stated age Attitude/Demeanor/Rapport:  Other Affect (typically observed):  Appropriate Orientation:  Oriented to Place, Oriented to Self, Oriented to Situation, Oriented to  Time Alcohol / Substance use:  Not Applicable Psych involvement (Current and /or in the community):  No (Comment)  Discharge Needs  Concerns to be addressed:  No discharge needs identified Readmission within the last 30 days:  No Current discharge risk:  None Barriers to Discharge:  No Barriers Identified   Wende Neighbors, LCSW 05/21/2017, 1:25 PM

## 2017-05-21 NOTE — NC FL2 (Signed)
[place] MEDICAID FL2 LEVEL OF CARE SCREENING TOOL     IDENTIFICATION  Patient Name: [name] Birthdate: 06-Sep-1945 Sex: female Admission Date (Current Location): 05/17/2017  [facility] and [place] Number:  [employer] and Address:  [facility], [place] [address] [place], [place] [address]      Provider Number: [id number]  Attending Physician Name and Address:  Kayleen Memos, DO  Relative Name and Phone Number:  Anett Ranker, [phone]    Current Level of Care: SNF Recommended Level of Care: [place] Prior Approval Number:    Date Approved/Denied:   PASRR Number: [id number]  Discharge Plan: SNF    Current Diagnoses: Patient Active Problem List   Diagnosis Date Noted  . Knee dislocation 05/17/2017  . Popliteal artery injury 05/17/2017  . Hypokalemia 05/17/2017  . Leukocytosis 05/17/2017  . Injury of right popliteal artery 05/17/2017  . HTN (hypertension) 07/02/2015  . Hyperlipidemia 02/04/2011  . Hyperglycemia 02/04/2011  . Leg edema 02/04/2011  . PAF (paroxysmal atrial fibrillation) ([place]) 02/02/2011  . Leg swelling 02/02/2011    Orientation RESPIRATION BLADDER Height & Weight     Time, Situation, Place, Self  Normal Continent Weight: (!) 305 lb 8.9 oz (138.6 kg) Height:  5\' 8"  (172.7 cm)  BEHAVIORAL SYMPTOMS/MOOD NEUROLOGICAL BOWEL NUTRITION STATUS      Continent Diet(Carb modified)  AMBULATORY STATUS COMMUNICATION OF NEEDS Skin   Limited Assist Verbally                         Personal Care Assistance Level of Assistance  Bathing, Feeding, Dressing Bathing Assistance: Limited assistance Feeding assistance: Independent Dressing Assistance: Limited assistance     Functional Limitations Info  Sight, Hearing, Speech Sight Info: Adequate Hearing Info: Adequate Speech Info: Adequate    SPECIAL CARE FACTORS FREQUENCY  PT (By licensed PT), OT (By licensed OT)     PT Frequency: 5x wk OT  Frequency: 5x wk            Contractures Contractures Info: Not present    Additional Factors Info  Code Status, Allergies Code Status Info: Full Code Allergies Info: ERYTHROMYCIN, PLAVIX CLOPIDOGREL BISULFATE, SULFONAMIDE DERIVATIVES           Current Medications (05/21/2017):  This is the current hospital active medication list Current Facility-Administered Medications  Medication Dose Route Frequency Provider Last Rate Last Dose  . 0.9 %  sodium chloride infusion   Intravenous Once Waynetta Sandy, MD      . 0.9 %  sodium chloride infusion  500 mL Intravenous Once PRN Dagoberto Ligas, PA-C      . acetaminophen (TYLENOL) tablet 325-650 mg  325-650 mg Oral Q4H PRN Dagoberto Ligas, PA-C       Or  . acetaminophen (TYLENOL) suppository 325-650 mg  325-650 mg Rectal Q4H PRN Dagoberto Ligas, PA-C      . alum & mag hydroxide-simeth (MAALOX/MYLANTA) 200-200-20 MG/5ML suspension 15-30 mL  15-30 mL Oral Q2H PRN Dagoberto Ligas, PA-C      . apixaban (ELIQUIS) tablet 5 mg  5 mg Oral BID Dagoberto Ligas, PA-C   5 mg at 05/21/17 1007  . diltiazem (CARDIZEM CD) 24 hr capsule 240 mg  240 mg Oral Daily Elodia Florence., MD   240 mg at 05/21/17 1007  . docusate sodium (COLACE) capsule 100 mg  100 mg Oral Daily Dagoberto Ligas, PA-C   100 mg at 05/21/17 1007  .  famotidine (PEPCID) tablet 10 mg  10 mg Oral QHS Elodia Florence., MD   10 mg at 05/20/17 2106  . guaiFENesin-dextromethorphan (ROBITUSSIN DM) 100-10 MG/5ML syrup 15 mL  15 mL Oral Q4H PRN Dagoberto Ligas, PA-C      . hydrALAZINE (APRESOLINE) injection 5 mg  5 mg Intravenous Q20 Min PRN Dagoberto Ligas, PA-C      . HYDROmorphone (DILAUDID) injection 0.5 mg  0.5 mg Intravenous Q3H PRN Elodia Florence., MD   0.5 mg at 05/18/17 6433  . insulin aspart (novoLOG) injection 0-15 Units  0-15 Units Subcutaneous TID WC Rhyne, Samantha J, PA-C   2 Units at 05/21/17 1242  . ketorolac (TORADOL) 15 MG/ML injection 15 mg   15 mg Intravenous Once Hall, Carole N, DO      . labetalol (NORMODYNE,TRANDATE) injection 10 mg  10 mg Intravenous Q10 min PRN Dagoberto Ligas, PA-C      . magnesium sulfate IVPB 2 g 50 mL  2 g Intravenous Daily PRN Dagoberto Ligas, PA-C      . metoprolol tartrate (LOPRESSOR) injection 2-5 mg  2-5 mg Intravenous Q2H PRN Dagoberto Ligas, PA-C      . ondansetron (ZOFRAN) injection 4 mg  4 mg Intravenous Q6H PRN Dagoberto Ligas, PA-C      . [START ON 05/22/2017] oxyCODONE-acetaminophen (PERCOCET/ROXICET) 5-325 MG per tablet 1 tablet  1 tablet Oral Q0600 Irene Pap N, DO      . oxyCODONE-acetaminophen (PERCOCET/ROXICET) 5-325 MG per tablet 1-2 tablet  1-2 tablet Oral Q4H PRN Dagoberto Ligas, PA-C   1 tablet at 05/21/17 1007  . pantoprazole (PROTONIX) EC tablet 40 mg  40 mg Oral Daily Dagoberto Ligas, PA-C   40 mg at 05/21/17 1007  . phenol (CHLORASEPTIC) mouth spray 1 spray  1 spray Mouth/Throat PRN Dagoberto Ligas, PA-C      . polyethylene glycol (MIRALAX / GLYCOLAX) packet 17 g  17 g Oral Daily PRN Dagoberto Ligas, PA-C      . potassium chloride SA (K-DUR,KLOR-CON) CR tablet 20-40 mEq  20-40 mEq Oral Daily PRN Dagoberto Ligas, PA-C         Discharge Medications: Please see discharge summary for a list of discharge medications.  Relevant Imaging Results:  Relevant Lab Results:   Additional Information SS#[id number]  Wende Neighbors, LCSW

## 2017-05-21 NOTE — Progress Notes (Signed)
Physical Therapy Treatment Patient Details Name: [name] MRN: [id number] DOB: [birth year] Today's Date: 05/21/2017    History of Present Illness Pt is a 71 y.o. female admitted 05/17/17 post-fall in snow resulting in R knee hyperextension, dislocation of R TKA, and popliteal artery occlusion. Now s/p RLE popliteal artery bypass. Peritnent PMH includes bilat TKA, L hip arthritis, a-fib, HTN, DM, obesity.      PT Comments     Pt performed increased activity during session but remains fearful to sit in recliner as she fear she cannot get up from a standard seat height, PTA offered transfer to chair and back out to build confidence from lower seated surface but patient refused.  All transfers performed from elevated surfaces which is not helpful to simulate transfers at home of in the community.  Plan next session for continued mobility and gait training.  Plan for SNF remains appropriate to improve strength and mobility before returning home to private residence.   Follow Up Recommendations  SNF     Equipment Recommendations  None recommended by PT(owns DME)    Recommendations for Other Services OT consult     Precautions / Restrictions Precautions Precautions: Fall Required Braces or Orthoses: Knee Immobilizer - Right Knee Immobilizer - Right: Other (comment)(no orders provided mobility in KI per MD note.  ) Restrictions Weight Bearing Restrictions: Yes RLE Weight Bearing: Weight bearing as tolerated Other Position/Activity Restrictions: WBAT per MD Stann Mainland) note    Mobility  Bed Mobility Overal bed mobility: Needs Assistance Bed Mobility: Supine to Sit;Sit to Supine       Sit to supine: Min assist   General bed mobility comments: Pt required assistance for RLE OOB. patient able to lift RLE against gravity for back to bed unassisted.    Transfers Overall transfer level: Needs assistance Equipment used: Rolling walker (2 wheeled) Transfers: Sit to/from Stand            General transfer comment: Pt required cues for hand placement to and from seated surface.  Pt requested bed raised to an elevated position as she is not confident in her ability to transfer from stancard seat height.  Cues for foot placement to improve ease.    Ambulation/Gait Ambulation/Gait assistance: Min assist Ambulation Distance (Feet): 16 Feet Assistive device: Rolling walker (2 wheeled) Gait Pattern/deviations: Step-to pattern;Decreased stride length;Trunk flexed;Decreased weight shift to right   Gait velocity interpretation: Below normal speed for age/gender General Gait Details: Pt required cues for sequencing, upper trunk control and RW safety.  Pt limited due to fatigue but did increase gait distance.  Pt refused to sit in recliner post session.     Stairs            Wheelchair Mobility    Modified Rankin (Stroke Patients Only)       Balance Overall balance assessment: Needs assistance   Sitting balance-Leahy Scale: Fair     Standing balance support: Bilateral upper extremity supported;During functional activity Standing balance-Leahy Scale: Poor Standing balance comment: Relies on B UE support for safety.                             Cognition Arousal/Alertness: Awake/alert Behavior During Therapy: WFL for tasks assessed/performed Overall Cognitive Status: Within Functional Limits for tasks assessed  Exercises      General Comments        Pertinent Vitals/Pain Pain Assessment: 0-10 Pain Score: 6  Pain Location: R LE Pain Descriptors / Indicators: Sore Pain Intervention(s): Repositioned    Home Living                      Prior Function            PT Goals (current goals can now be found in the care plan section) Acute Rehab PT Goals Patient Stated Goal: go home soon Potential to Achieve Goals: Good Progress towards PT goals: Progressing toward goals     Frequency    Min 3X/week      PT Plan Current plan remains appropriate    Co-evaluation              AM-PAC PT "6 Clicks" Daily Activity  Outcome Measure  Difficulty turning over in bed (including adjusting bedclothes, sheets and blankets)?: Unable Difficulty moving from lying on back to sitting on the side of the bed? : Unable Difficulty sitting down on and standing up from a chair with arms (e.g., wheelchair, bedside commode, etc,.)?: Unable Help needed moving to and from a bed to chair (including a wheelchair)?: A Little Help needed walking in hospital room?: A Little Help needed climbing 3-5 steps with a railing? : A Lot 6 Click Score: 11    End of Session Equipment Utilized During Treatment: Gait belt   Patient left: in chair;with call bell/phone within reach Nurse Communication: Mobility status PT Visit Diagnosis: Other abnormalities of gait and mobility (R26.89);Pain Pain - Right/Left: Right Pain - part of body: Leg     Time: 3846-6599 PT Time Calculation (min) (ACUTE ONLY): 50 min  Charges:  $Gait Training: 8-22 mins $Therapeutic Activity: 23-37 mins                    G CodesGovernor Rooks, PTA pager [phone]    [name] 05/21/2017, 11:25 AM

## 2017-05-21 NOTE — Progress Notes (Signed)
PROGRESS NOTE  OSHA RANE FBP:[id number] DOB: September 06, 1945 DOA: 05/17/2017 PCP: Leonard Downing, MD  HPI/Recap of past 24 hours: Pt. with PMH of bilateral knee replacement, A. fib on Eliquis, HTN, obesity; admitted on 05/17/2017, presented with complaint of fall, was found to have dislocation of the right knee as well as critical limb ischemia.  Underwent closed reduction of the right knee followed by right popliteal artery bypass. POD #2 Post harvest of right greater saphenous vein and right above knee to below knee popliteal artery bypass with non-reversed greater saphenous vein.  Pt seen and examined at her bedside. She reports no pain when she does not move. Vascular surgery following. Blisters noted on right leg anteriorly and posteriorly.   POD #4 post harvest of right greater saphenous vein and right above knee to below knee popliteal artery bypass with non-reversed greater saphenous vein.    Assessment/Plan: Principal Problem:   Popliteal artery injury Active Problems:   PAF (paroxysmal atrial fibrillation) (HCC)   HTN (hypertension)   Knee dislocation   Hypokalemia   Leukocytosis   Injury of right popliteal artery  1.  Post closed knee reduction POD # 4 Right knee dislocation post mechanical fall, poa. -History of bilateral knee replacement. -Orthopedic surgery following- closed knee reduction 05/17/17 -has a knee immobilizer: To be worn all the time and weightbearing as tolerated per orthopedic -PT recommends SNF -social worker consulted for placement. We appreciate recommendations.  2.  Right popliteal artery occlusio POD #4 post harvest of right greater saphenous vein and right above knee to below knee popliteal artery bypass with non-reversed greater saphenous vein. -Due to severe hematoma and dislocation as well as focal dissection of the right popliteal artery seen on CT angiogram. -Vascular surgery following -Blisters on right leg; closely monitor  3.   Chronic A. fib. -CHA2DS2-VASc Score 4 -rate controlled; on cardizem -Eliquis restarted for a-fib (05/20/17)  4.  Acute postoperative blood loss anemia. -hg continues to drop -hg 8.8 from 9.7 (13.6) -fobt today 05/21/17 -cbc am  5.  Leukocytosis possibly reactive from recent surgery, resolved. -wbc 10.5 from 13 from Wbc 17 from 14 -no sign of active infective process -procalcitonin less than 0.10  6. Hyperglycemia -improving -a1C 5.7 (05/17/17) -ISS and hypoglycemia protocol -bmp am  7.  Hypokalemia, resolved. -BMP am   Code Status: Full  Family Communication: No family members at bedside  Disposition Plan: Will stay another midnight to continue current management   Consultants:  Vascular surgery  Orthopedic surgery  Procedures: POD #4 post harvest of right greater saphenous vein and right above knee to below knee popliteal artery bypass with non-reversed greater saphenous vein.    Antimicrobials:  None  DVT prophylaxis:  SCDs, eliquis for chronic a-fib   Objective: Vitals:   05/20/17 2012 05/20/17 2357 05/21/17 0359 05/21/17 0400  BP: 127/62 (!) 119/53 117/67 117/67  Pulse:      Resp: 14 19 17 15   Temp: 99.2 F (37.3 C) 98.8 F (37.1 C) 98.5 F (36.9 C)   TempSrc: Oral Oral Oral   SpO2: 98% 96% 96%   Weight:   (!) 138.6 kg (305 lb 8.9 oz)   Height:        Intake/Output Summary (Last 24 hours) at 05/21/2017 0720 Last data filed at 05/21/2017 0606 Gross per 24 hour  Intake 600 ml  Output 1500 ml  Net -900 ml   Filed Weights   05/18/17 0000 05/18/17 0100 05/21/17 0359  Weight: 133.4 kg (294  lb 1.5 oz) (!) 138 kg (304 lb 3.8 oz) (!) 138.6 kg (305 lb 8.9 oz)    Exam:   General:  71 yo CF WD wN NAD  Cardiovascular: IRRR no rubs or gallops  Respiratory: CTA no wheezes or rhonchi  Abdomen: obese NBS x4   Musculoskeletal: Non focal moves all limbs  Skin: no open lesions noted  Psychiatry: Mood is appropriate for condition and  setting   Data Reviewed: CBC: Recent Labs  Lab 05/17/17 1357 05/18/17 0433 05/20/17 0255 05/20/17 1316 05/21/17 0215  WBC 14.9* 17.0* 13.4*  --  10.5  NEUTROABS 11.6*  --   --   --   --   HGB 15.6* 13.6 9.7* 9.6* 8.8*  HCT 46.1* 42.9 29.2* 29.8* 27.4*  MCV 90.2 93.9 91.8  --  92.3  PLT 234 248 173  --  854   Basic Metabolic Panel: Recent Labs  Lab 05/17/17 1357 05/18/17 0433 05/20/17 0255 05/21/17 0215  NA 141 140 138 139  K 2.9* 3.6 3.9 4.0  CL 103 102 103 105  CO2 28 26 31 30   GLUCOSE 145* 171* 131* 124*  BUN 22* 21* 21* 17  CREATININE 0.72 0.81 0.74 0.77  CALCIUM 9.5 9.0 8.4* 8.1*  MG 1.9  --   --   --    GFR: Estimated Creatinine Clearance: 96.9 mL/min (by C-G formula based on SCr of 0.77 mg/dL). Liver Function Tests: Recent Labs  Lab 05/18/17 0433  AST 31  ALT 23  ALKPHOS 71  BILITOT 1.0  PROT 5.9*  ALBUMIN 3.5   No results for input(s): LIPASE, AMYLASE in the last 168 hours. No results for input(s): AMMONIA in the last 168 hours. Coagulation Profile: Recent Labs  Lab 05/17/17 1357 05/18/17 0433  INR 1.04 1.13   Cardiac Enzymes: No results for input(s): CKTOTAL, CKMB, CKMBINDEX, TROPONINI in the last 168 hours. BNP (last 3 results) No results for input(s): PROBNP in the last 8760 hours. HbA1C: No results for input(s): HGBA1C in the last 72 hours. CBG: Recent Labs  Lab 05/19/17 2058 05/20/17 0612 05/20/17 1144 05/20/17 1644 05/20/17 2040  GLUCAP 151* 106* 126* 142* 118*   Lipid Profile: No results for input(s): CHOL, HDL, LDLCALC, TRIG, CHOLHDL, LDLDIRECT in the last 72 hours. Thyroid Function Tests: No results for input(s): TSH, T4TOTAL, FREET4, T3FREE, THYROIDAB in the last 72 hours. Anemia Panel: No results for input(s): VITAMINB12, FOLATE, FERRITIN, TIBC, IRON, RETICCTPCT in the last 72 hours. Urine analysis:    Component Value Date/Time   COLORURINE YELLOW 06/17/2009 [place] 06/17/2009 1308   LABSPEC 1.020  06/17/2009 1308   PHURINE 7.0 06/17/2009 1308   GLUCOSEU NEGATIVE 06/17/2009 1308   HGBUR NEGATIVE 06/17/2009 1308   BILIRUBINUR n 01/25/2013 1113   KETONESUR NEGATIVE 06/17/2009 1308   PROTEINUR n 01/25/2013 1113   PROTEINUR NEGATIVE 06/17/2009 1308   UROBILINOGEN negative 01/25/2013 1113   UROBILINOGEN 0.2 06/17/2009 1308   NITRITE n 01/25/2013 1113   NITRITE NEGATIVE 06/17/2009 1308   LEUKOCYTESUR Negative 01/25/2013 1113   Sepsis Labs: @LABRCNTIP (procalcitonin:4,lacticidven:4)  )No results found for this or any previous visit (from the past 240 hour(s)).    Studies: No results found.  Scheduled Meds: . apixaban  5 mg Oral BID  . diltiazem  240 mg Oral Daily  . docusate sodium  100 mg Oral Daily  . famotidine  10 mg Oral QHS  .  HYDROmorphone (DILAUDID) injection  0.5 mg Intravenous Once  . insulin aspart  0-15 Units Subcutaneous TID WC  . pantoprazole  40 mg Oral Daily    Continuous Infusions: . sodium chloride    . sodium chloride    . magnesium sulfate 1 - 4 g bolus IVPB       LOS: 4 days     Kayleen Memos, MD [facility] Pager [phone]  If 7PM-7AM, please contact night-coverage [email or web address] Password [email or web address] 05/21/2017, 7:20 AM

## 2017-05-21 NOTE — Care Management Important Message (Signed)
Important Message  Patient Details  Name: [name] MRN: [id number] Date of Birth: 08-Dec-1945   Medicare Important Message Given:  Yes    Lataisha Colan [address] 05/21/2017, 2:16 PM

## 2017-05-22 LAB — CBC
HCT: 29.3 % — ABNORMAL LOW (ref 36.0–46.0)
HCT: 33.7 % — ABNORMAL LOW (ref 36.0–46.0)
Hemoglobin: 9.5 g/dL — ABNORMAL LOW (ref 12.0–15.0)
Hemoglobin: 10.7 g/dL — ABNORMAL LOW (ref 12.0–15.0)
MCH: 30.1 pg (ref 26.0–34.0)
MCH: 29.5 pg (ref 26.0–34.0)
MCHC: 32.4 g/dL (ref 30.0–36.0)
MCHC: 31.8 g/dL (ref 30.0–36.0)
MCV: 92.7 fL (ref 78.0–100.0)
MCV: 92.8 fL (ref 78.0–100.0)
PLATELETS: 204 10*3/uL (ref 150–400)
PLATELETS: 273 10*3/uL (ref 150–400)
RBC: 3.16 MIL/uL — ABNORMAL LOW (ref 3.87–5.11)
RBC: 3.63 MIL/uL — AB (ref 3.87–5.11)
RDW: 14.6 % (ref 11.5–15.5)
RDW: 14.6 % (ref 11.5–15.5)
WBC: 10.2 10*3/uL (ref 4.0–10.5)
WBC: 12.8 10*3/uL — AB (ref 4.0–10.5)

## 2017-05-22 LAB — GLUCOSE, CAPILLARY
GLUCOSE-CAPILLARY: 132 mg/dL — AB (ref 65–99)
Glucose-Capillary: 115 mg/dL — ABNORMAL HIGH (ref 65–99)

## 2017-05-22 LAB — BASIC METABOLIC PANEL
Anion gap: 4 — ABNORMAL LOW (ref 5–15)
BUN: 15 mg/dL (ref 6–20)
CALCIUM: 8.1 mg/dL — AB (ref 8.9–10.3)
CO2: 28 mmol/L (ref 22–32)
CREATININE: 0.72 mg/dL (ref 0.44–1.00)
Chloride: 105 mmol/L (ref 101–111)
GFR calc non Af Amer: >60 mL/min (ref >60)
Glucose, Bld: 118 mg/dL — ABNORMAL HIGH (ref 65–99)
Potassium: 4 mmol/L (ref 3.5–5.1)
SODIUM: 137 mmol/L (ref 135–145)

## 2017-05-22 MED ORDER — OXYCODONE-ACETAMINOPHEN 5-325 MG PO TABS: 1.0000 | ORAL_TABLET | Freq: Four times a day (QID) | ORAL | 0 refills | Status: DC | PRN

## 2017-05-22 NOTE — Progress Notes (Signed)
Patient up getting a bath and using [facility] H.R. Up to 120 At. Fib. Patient did not feel her heart rate up. Patient setting in chair and H.R. Back down to 70's At. Fib.

## 2017-05-22 NOTE — Progress Notes (Signed)
Clinical Social Worker facilitated patient discharge including contacting patient family and facility to confirm patient discharge plans.  Clinical information faxed to facility and family agreeable with plan.  CSW arranged ambulance transport via [facility] to [facility] .  RN to call [phone] and ask to speak to RN Sharyn Lull (pt will go in room 208) for  report prior to discharge.  Clinical Social Worker will sign off for now as social work intervention is no longer needed. Please consult [name] again if new need arises.  Rhea Pink, MSW, [place]

## 2017-05-22 NOTE — Progress Notes (Addendum)
  Progress Note    05/22/2017 9:11 AM 5 Days Post-Op  Subjective:  R knee feeling better.  Anticipating d/c to [facility] rehab facility.   Vitals:   05/22/17 0355 05/22/17 0731  BP: 130/79 114/62  Pulse: 82 84  Resp:  18  Temp: 99 F (37.2 C) 99.1 F (37.3 C)  SpO2: 96% 98%   Physical Exam: Cardiac:  Irregular Incisions:  Blistering proximal to incisions and behind knee; incisions are soft and without sign of continued bleeding; some serous collection on dressing of proximal incision Extremities:  Palpable R AT Abdomen:  soft Neurologic: Moving all extremities well  CBC    Component Value Date/Time   WBC 12.8 (H) 05/22/2017 0616   RBC 3.63 (L) 05/22/2017 0616   HGB 10.7 (L) 05/22/2017 0616   HCT 33.7 (L) 05/22/2017 0616   PLT 273 05/22/2017 0616   MCV 92.8 05/22/2017 0616   MCH 29.5 05/22/2017 0616   MCHC 31.8 05/22/2017 0616   RDW 14.6 05/22/2017 0616   LYMPHSABS 2.0 05/17/2017 1357   MONOABS 1.2 (H) 05/17/2017 1357   EOSABS 0.1 05/17/2017 1357   BASOSABS 0.0 05/17/2017 1357    BMET    Component Value Date/Time   NA 137 05/22/2017 0227   NA 143 02/10/2017 1010   K 4.0 05/22/2017 0227   CL 105 05/22/2017 0227   CO2 28 05/22/2017 0227   GLUCOSE 118 (H) 05/22/2017 0227   BUN 15 05/22/2017 0227   BUN 20 02/10/2017 1010   CREATININE 0.72 05/22/2017 0227   CREATININE 0.79 01/21/2016 0947   CALCIUM 8.1 (L) 05/22/2017 0227   GFRNONAA >60 05/22/2017 0227   GFRAA >60 05/22/2017 0227    INR    Component Value Date/Time   INR 1.13 05/18/2017 0433     Intake/Output Summary (Last 24 hours) at 05/22/2017 0911 Last data filed at 05/22/2017 5329 Gross per 24 hour  Intake 1200 ml  Output 600 ml  Net 600 ml     Assessment/Plan:  71 y.o. female is s/p right AKA popliteal to BK popliteal bypass with GS V conduit 5 Days Post-Op   Hemoglobin stabilized Incisions remained stable with wound edges approximated well with staples Okay for discharge from vascular  surgery standpoint The patient will be called to follow-up in office with Dr. Donzetta Matters in about 2 weeks  DVT prophylaxis: Home anticoagulation   Dagoberto Ligas, PA-C [facility] [phone] 05/22/2017 9:11 AM

## 2017-05-22 NOTE — Discharge Summary (Signed)
Discharge Summary  [name] YWV:[id number] DOB: [birth year]  PCP: Leonard Downing, MD  Admit date: 05/17/2017 Discharge date: 05/22/2017  Time spent: 25 minutes  Recommendations for Outpatient Follow-up:  Follow up with PCP within a week Follow up with vascular surgery Continue physical therapy at SNF for rehab Take your medications as prescribed  Discharge Diagnoses:  Active Hospital Problems   Diagnosis Date Noted  . Popliteal artery injury 05/17/2017  . Knee dislocation 05/17/2017  . Hypokalemia 05/17/2017  . Leukocytosis 05/17/2017  . Injury of right popliteal artery 05/17/2017  . HTN (hypertension) 07/02/2015  . PAF (paroxysmal atrial fibrillation) ([facility]) 02/02/2011    Resolved Hospital Problems  No resolved problems to display.    Discharge Condition: Stable  Diet recommendation: resume previous diet  Vitals:   05/22/17 0355 05/22/17 0731  BP: 130/79 114/62  Pulse: 82 84  Resp:  18  Temp: 99 F (37.2 C) 99.1 F (37.3 C)  SpO2: 96% 98%    History of present illness:  Pt. with PMH ofbilateral knee replacement, paroxysmal A. fib on Eliquis, HTN, obesity; admitted on12/03/2017, presented with complaint offall, was found to havedislocation of the right knee as well as critical limb ischemia.Underwent closed reduction of the right knee followed by right popliteal artery bypass. POD #5 Post harvest of right greater saphenous vein and right above knee to below knee popliteal artery bypass with non-reversed greater saphenous vein.   Post surgery patient worked with PT who recommended SNF for rehab to continue physical therapy with 24 hour assistance. Vascular surgery followed and reported good progression of healing. Mild blisters of right leg noted. Started back on eliquis for her paroxysmal a-fib.  On the day of discharge the patient was hemodynamically stable. She will need to continue PT at SNF and to follow up with vascular surgery, cardiology and  PCP post hospitalization.   Hospital Course:  Principal Problem:   Popliteal artery injury Active Problems:   PAF (paroxysmal atrial fibrillation) (H[facility])   HTN (hypertension)   Knee dislocation   Hypokalemia   Leukocytosis   Injury of right popliteal artery  1.Post closed knee reduction POD # 5 Right knee dislocation post mechanical fall, poa. -History of bilateral knee replacement. -Orthopedic surgery following- closed knee reduction 05/17/17 -has a knee immobilizer: To be worn all the time and weightbearing as tolerated per orthopedic -PT recommends SNF -social worker consulted for placement. Has a bed wand will be transferred today 05/22/17 to SNF for physical rehab  2.Right popliteal artery occlusio POD #5 post harvest of right greater saphenous vein and right above knee to below knee popliteal artery bypass with non-reversed greater saphenous vein. -Due to severe hematoma and dislocation as well as focal dissection of the right popliteal artery seen on CT angiogram. -Vascular surgery following -Blisters on right leg- noted  3.Paroxysmal A. fib. -CHA2DS2-VASc Score 4 -rate controlled; on cardizem -Eliquis restarted for a-fib (05/20/17)  4.Acute postoperative blood loss anemia. -stable -hg 10.7 from 9.5 from 8.8 from 9.7 (13.6) - no sign of overt bleeding  5.Leukocytosis possibly reactive from recent surgery, resolved. -resolved wbc 10.5 from 13 from Wbc 17 from 14 -no sign of active infective process -procalcitonin less than 0.10  6. Hyperglycemia, resolved -a1C 5.7 (05/17/17) -ISS and hypoglycemia protocol  7.Hypokalemia, resolved. -K+ 4.0   Procedures: POD #5 Post harvest of right greater saphenous vein and right above knee to below knee popliteal artery bypass with non-reversed greater saphenous vein.   Consultations:  Vascular surgery  PT  Discharge Exam: BP 114/62 (BP Location: Right Arm)   Pulse 84   Temp 99.1 F (37.3 C) (Oral)    Resp 18   Ht 5\' 8"  (1.727 m)   Wt (!) 138 kg (304 lb 3.8 oz)   LMP 07/22/2010   SpO2 98%   BMI 46.26 kg/m   General: 71 yo CF obese, laying in bed in NAD. A&O x4 Cardiovascular: RRR no rubs or gallops. No JVD. 2/4 pulses in all 4 extremities. Respiratory: CTA no wheezes or rales   Discharge Instructions You were cared for by a hospitalist during your hospital stay. If you have any questions about your discharge medications or the care you received while you were in the hospital after you are discharged, you can call the unit and asked to speak with the hospitalist on call if the hospitalist that took care of you is not available. Once you are discharged, your primary care physician will handle any further medical issues. Please note that NO REFILLS for any discharge medications will be authorized once you are discharged, as it is imperative that you return to your primary care physician (or establish a relationship with a primary care physician if you do not have one) for your aftercare needs so that they can reassess your need for medications and monitor your lab values.   Allergies as of 05/22/2017      Reactions   Erythromycin    Nausea   Plavix [clopidogrel Bisulfate]    cough   Sulfonamide Derivatives    Rash      Medication List    STOP taking these medications   amoxicillin 500 MG capsule Commonly known as:  AMOXIL   propranolol 10 MG tablet Commonly known as:  INDERAL   triamterene-hydrochlorothiazide 75-50 MG tablet Commonly known as:  MAXZIDE     TAKE these medications   ACCU-CHEK AVIVA PLUS test strip Generic drug:  glucose blood   BIOTIN PO Take 1 capsule by mouth at bedtime.   diltiazem 240 MG 24 hr capsule Commonly known as:  CARDIZEM CD TAKE 1 CAPSULE BY MOUTH DAILY.   ELIQUIS 5 MG Tabs tablet Generic drug:  apixaban TAKE 1 TABLET BY MOUTH 2 TIMES DAILY.   FISH OIL PO Take 1 capsule by mouth daily. 1200 mg daily   Flax Seed Oil 1000 MG  Caps Take 1,000 mg by mouth daily.   GNP CALCIUM PLUS 600 +D PO Take 1 tablet by mouth daily.   loratadine 10 MG tablet Commonly known as:  CLARITIN Take 10 mg by mouth daily.   mometasone 50 MCG/ACT nasal spray Commonly known as:  NASONEX Place 1 spray into the nose daily as needed for congestion.   multivitamin per tablet Take 1 tablet by mouth daily.   oxyCODONE-acetaminophen 5-325 MG tablet Commonly known as:  PERCOCET/ROXICET Take 1 tablet by mouth every 6 (six) hours as needed for severe pain.   PATADAY 0.2 % Soln Generic drug:  Olopatadine HCl Place 1 drop into both eyes daily.   potassium chloride 10 MEQ tablet Commonly known as:  K-DUR Take 1 tablet (10 mEq total) by mouth daily. Please keep 02/12/17 Appt for further refills   pyridOXINE 100 MG tablet Commonly known as:  VITAMIN B-6 Take 100 mg by mouth daily.   ranitidine 150 MG tablet Commonly known as:  ZANTAC Take 75 mg by mouth at bedtime.   STOOL SOFTENER PO Take 1 capsule by mouth at bedtime.   vitamin C 1000  MG tablet Take 1,000 mg by mouth daily.      Allergies  Allergen Reactions  . Erythromycin     Nausea  . Plavix [Clopidogrel Bisulfate]     cough  . Sulfonamide Derivatives     Rash    Contact information for follow-up providers    Paralee Cancel, MD. Schedule an appointment as soon as possible for a visit in 3 week(s).   Specialty:  Orthopedic Surgery Contact information: [address] [place] [address] [phone]        Waynetta Sandy, MD Follow up in 2 week(s).   Specialties:  Vascular Surgery, Cardiology Contact information: [address] [place] [address] [phone]            Contact information for after-discharge care    Destination    [facility] .   Service:  Skilled Nursing Contact information: [place] [phone]                   The  results of significant diagnostics from this hospitalization (including imaging, microbiology, ancillary and laboratory) are listed below for reference.    Significant Diagnostic Studies: Dg Knee 2 Views Right  Result Date: 05/17/2017 CLINICAL DATA:  Status post reduction EXAM: RIGHT KNEE - 1-2 VIEW COMPARISON:  Films from earlier in the same day FINDINGS: Previously seen knee dislocation has been reduced. No acute fractures noted. No prosthetic abnormality is seen. IMPRESSION: Reduction of previously seen knee dislocation Electronically Signed   By: Inez Catalina M.D.   On: 05/17/2017 15:42   Ct Angio Low Extrem Right W &/or Wo Contrast  Result Date: 05/17/2017 CLINICAL DATA:  Recent right knee dislocation EXAM: CT ANGIOGRAPHY OF THE RIGHT LOWEREXTREMITY TECHNIQUE: Multidetector CT imaging of the right lower extremity was performed using the standard protocol during bolus administration of intravenous contrast. Multiplanar CT image reconstructions and MIPs were obtained to evaluate the vascular anatomy. CONTRAST:  155mL ISOVUE-370 IOPAMIDOL (ISOVUE-370) INJECTION 76% COMPARISON:  None FINDINGS: Right external iliac artery and common femoral artery are widely patent. The femoral bifurcation is widely patent. Superficial femoral artery is patent. The proximal popliteal artery is patent although there is evidence of abrupt occlusion just above the level of the right knee prosthesis. Minimal reconstitution is noted of the popliteal artery posterior to the knee with more distal reconstitution of the distal popliteal artery and popliteal trifurcation. The timing of the contrast bolus limits evaluation at the level of the ankle although three-vessel runoff to the level of the mid calf is noted. No acute bony abnormality is noted. Right knee prosthesis is seen which somewhat limits the evaluation of the popliteal artery although the level of occlusion occurs above the prosthesis. Subcutaneous edema is noted  related to the recent injury. A small joint effusion is noted. Review of the MIP images confirms the above findings. IMPRESSION: There is occlusion of the popliteal artery likely related to focal dissection secondary to the known knee dislocation. Reconstitution of the distal popliteal artery is noted with three-vessel runoff to the mid to distal lower extremity. The timing of the contrast bolus precludes evaluation of the vasculature at the level of the ankle. Duplex evaluation of the popliteal artery but may also be helpful to delineate the length of the arterial abnormality. Critical Value/emergent results were called by telephone at the time of interpretation on 05/17/2017 at 4:51 pm to Dr. Carmin Muskrat , who verbally acknowledged these results.  Electronically Signed   By: Inez Catalina M.D.   On: 05/17/2017 16:54   Dg Knee Right Port  Result Date: 05/17/2017 CLINICAL DATA:  Right knee pain after fall today EXAM: PORTABLE RIGHT KNEE - 1-2 VIEW COMPARISON:  None. FINDINGS: Status post right total knee arthroplasty. Anterior dislocation of the right tibia at the right knee joint. Large right knee joint effusion. Suggestion of cortical irregularity at the proximal right fibula, cannot exclude a nondisplaced fracture in this location. No additional discrete osseous fracture. No evidence of hardware fracture or loosening. No suspicious focal osseous lesion. Small superior right patellar enthesophyte. Diffuse right knee soft tissue swelling. IMPRESSION: 1. Anterior right knee dislocation with large right knee joint effusion. 2. Possible nondisplaced proximal right fibula fracture. 3. Right total knee arthroplasty without evidence of hardware fracture or loosening. Electronically Signed   By: Ilona Sorrel M.D.   On: 05/17/2017 13:59    Microbiology: No results found for this or any previous visit (from the past 240 hour(s)).   Labs: Basic Metabolic Panel: Recent Labs  Lab 05/17/17 1357 05/18/17 0433  05/20/17 0255 05/21/17 0215 05/22/17 0227  NA 141 140 138 139 137  K 2.9* 3.6 3.9 4.0 4.0  CL 103 102 103 105 105  CO2 28 26 31 30 28   GLUCOSE 145* 171* 131* 124* 118*  BUN 22* 21* 21* 17 15  CREATININE 0.72 0.81 0.74 0.77 0.72  CALCIUM 9.5 9.0 8.4* 8.1* 8.1*  MG 1.9  --   --   --   --    Liver Function Tests: Recent Labs  Lab 05/18/17 0433  AST 31  ALT 23  ALKPHOS 71  BILITOT 1.0  PROT 5.9*  ALBUMIN 3.5   No results for input(s): LIPASE, AMYLASE in the last 168 hours. No results for input(s): AMMONIA in the last 168 hours. CBC: Recent Labs  Lab 05/17/17 1357 05/18/17 0433 05/20/17 0255 05/20/17 1316 05/21/17 0215 05/22/17 0227 05/22/17 0616  WBC 14.9* 17.0* 13.4*  --  10.5 10.2 12.8*  NEUTROABS 11.6*  --   --   --   --   --   --   HGB 15.6* 13.6 9.7* 9.6* 8.8* 9.5* 10.7*  HCT 46.1* 42.9 29.2* 29.8* 27.4* 29.3* 33.7*  MCV 90.2 93.9 91.8  --  92.3 92.7 92.8  PLT 234 248 173  --  178 204 273   Cardiac Enzymes: No results for input(s): CKTOTAL, CKMB, CKMBINDEX, TROPONINI in the last 168 hours. BNP: BNP (last 3 results) No results for input(s): BNP in the last 8760 hours.  ProBNP (last 3 results) No results for input(s): PROBNP in the last 8760 hours.  CBG: Recent Labs  Lab 05/21/17 1119 05/21/17 1625 05/21/17 2153 05/22/17 0622 05/22/17 1149  GLUCAP 127* 97 126* 132* 115*       Signed:  Kayleen Memos, MD [facility] 05/22/2017, 12:29 PM

## 2017-05-22 NOTE — Discharge Instructions (Signed)
 [facility]  Discharge instructions  Lower Extremity Bypass Surgery  Please refer to the following instruction for your post-procedure care. Your surgeon or physician assistant will discuss any changes with you.  Activity  You are encouraged to walk as much as you can. You can slowly return to normal activities during the month after your surgery. Avoid strenuous activity and heavy lifting until your doctor tells you it's OK. Avoid activities such as vacuuming or swinging a golf club. Do not drive until your doctor give the OK and you are no longer taking prescription pain medications. It is also normal to have difficulty with sleep habits, eating and bowel movement after surgery. These will go away with time.  Bathing/Showering  You may shower after you go home. Do not soak in a bathtub, hot tub, or swim until the incision heals completely.  Incision Care  Clean your incision with mild soap and water. Shower every day. Pat the area dry with a clean towel. You do not need a bandage unless otherwise instructed. Do not apply any ointments or creams to your incision. If you have open wounds you will be instructed how to care for them or a visiting nurse may be arranged for you. If you have staples or sutures along your incision they will be removed at your post-op appointment. You may have skin glue on your incision. Do not peel it off. It will come off on its own in about one week. If you have a great deal of moisture in your groin, use a gauze help keep this area dry.  Diet  Resume your normal diet. There are no special food restrictions following this procedure. A low fat/ low cholesterol diet is recommended for all patients with vascular disease. In order to heal from your surgery, it is CRITICAL to get adequate nutrition. Your body requires vitamins, minerals, and protein. Vegetables are the best source of vitamins and minerals. Vegetables also provide the  perfect balance of protein. Processed food has little nutritional value, so try to avoid this.  Medications  Resume taking all your medications unless your doctor or nurse practitioner tells you not to. If your incision is causing pain, you may take over-the-counter pain relievers such as acetaminophen (Tylenol). If you were prescribed a stronger pain medication, please aware these medication can cause nausea and constipation. Prevent nausea by taking the medication with a snack or meal. Avoid constipation by drinking plenty of fluids and eating foods with high amount of fiber, such as fruits, vegetables, and grains. Take Colase 100 mg (an over-the-counter stool softener) twice a day as needed for constipation. Do not take Tylenol if you are taking prescription pain medications.  Follow Up  Our office will schedule a follow up appointment 2-3 weeks following discharge.  Please call us immediately for any of the following conditions  Severe or worsening pain in your legs or feet while at rest or while walking Increase pain, redness, warmth, or drainage (pus) from your incision site(s) Fever of 101 degree or higher The swelling in your leg with the bypass suddenly worsens and becomes more painful than when you were in the hospital If you have been instructed to feel your graft pulse then you should do so every day. If you can no longer feel this pulse, call the office immediately. Not all patients are given this instruction.  Leg swelling is common after leg bypass surgery.  The swelling should improve over a few months   following surgery. To improve the swelling, you may elevate your legs above the level of your heart while you are sitting or resting. Your surgeon or physician assistant may ask you to apply an ACE wrap or wear compression (TED) stockings to help to reduce swelling.  Reduce your risk of vascular disease  Stop smoking. If you would like help call [facility] at 1-800-QUIT-NOW  (1-800-784-8669) or [place] at 336-586-4000.  Manage your cholesterol Maintain a desired weight Control your diabetes weight Control your diabetes Keep your blood pressure down  If you have any questions, please call the office at [phone]   

## 2017-05-22 NOTE — Progress Notes (Signed)
Notice patient rhythm has change to At. Fib in the 80-90 Call Central Tele. patient went into At. Fib at 20:12 and I was not notified. Patient has history of At. Fib. Cont. To monitor patient and rhythm.

## 2017-05-22 NOTE — Progress Notes (Signed)
Patient convert to [name]

## 2017-05-24 ENCOUNTER — Telehealth: Payer: Self-pay | Admitting: Vascular Surgery

## 2017-05-24 NOTE — Telephone Encounter (Signed)
Sched appt 06/18/17 at 8:45. Lm on hm#.

## 2017-05-24 NOTE — Telephone Encounter (Signed)
-----   Message from Mena Goes, RN sent at 05/22/2017  1:51 PM EST ----- Regarding: 2-3 weeks with Donzetta Matters postop bypass   ----- Message ----- From: Iline Oven Sent: 05/22/2017   9:09 AM To: [facility] Charge Pool  Can you schedule this pt an appt with Dr. Donzetta Matters for about 2-3 weeks.  PO R AK pop to BK pop bypass with vein. Thanks, [facility]

## 2017-05-28 ENCOUNTER — Other Ambulatory Visit: Payer: Self-pay

## 2017-05-28 ENCOUNTER — Ambulatory Visit (INDEPENDENT_AMBULATORY_CARE_PROVIDER_SITE_OTHER): Payer: Medicare Other | Admitting: Vascular Surgery

## 2017-05-28 ENCOUNTER — Encounter: Payer: Self-pay | Admitting: Vascular Surgery

## 2017-05-28 VITALS — BP 108/66 | HR 81 | Temp 99.0°F | Resp 16 | Ht 69.0 in | Wt 307.0 lb

## 2017-05-28 DIAGNOSIS — S85001D Unspecified injury of popliteal artery, right leg, subsequent encounter: Secondary | ICD-10-CM

## 2017-05-28 NOTE — Progress Notes (Signed)
Subjective:     Patient ID: [name], female   DOB: Dec 17, 1945, 71 y.o.   MRN: [id number]  HPI 33-year-old female status post right above-knee to below-knee popliteal artery bypass for trauma.  She is now in rehab.  She is back taking her blood thinners at this time.  She does have pain as well as swelling of the leg and some blistering.  She is otherwise progressing well with her rehab.  She is slated to see orthopedics next week.   Review of Systems Right leg swelling Incisional blistering    Objective:   Physical Exam aaox3 Right leg incisions with staples There are a few areas of blistering on the right leg Palpable dp pulse    Assessment/plan     71 year old female follows up from right above-knee to below-knee popliteal artery bypass for trauma.  I discussed with him the need to continue rehab and follow-up with orthopedics as indicated.  From my standpoint she has a palpable pulse and the incisions are intact although there is some incisional irritation from the staples I would leave them today given how thin her skin is.  Swelling at this time is expected.  She will follow-up on January 11 for staple removal.  I have offered her to see me a week or sooner should she need and they will call if necessary.    Fleurette Woolbright C. Donzetta Matters, MD [facility] Office: [phone] Pager: [phone]

## 2017-06-16 ENCOUNTER — Ambulatory Visit: Payer: Medicare Other

## 2017-06-18 ENCOUNTER — Telehealth: Payer: Self-pay | Admitting: Cardiovascular Disease

## 2017-06-18 ENCOUNTER — Other Ambulatory Visit: Payer: Self-pay

## 2017-06-18 ENCOUNTER — Encounter: Payer: Self-pay | Admitting: Vascular Surgery

## 2017-06-18 ENCOUNTER — Ambulatory Visit (INDEPENDENT_AMBULATORY_CARE_PROVIDER_SITE_OTHER): Payer: Medicare Other | Admitting: Vascular Surgery

## 2017-06-18 VITALS — BP 119/67 | HR 90 | Temp 98.9°F | Resp 18 | Ht 69.0 in | Wt 293.0 lb

## 2017-06-18 DIAGNOSIS — S85001D Unspecified injury of popliteal artery, right leg, subsequent encounter: Secondary | ICD-10-CM

## 2017-06-18 NOTE — Telephone Encounter (Signed)
Spoke with patient who states she is recovering from right knee surgery s/p fall and severing of the right popliteal artery. She has an open lesion positive for MRSA on her right leg that is draining. She states she saw the vascular surgeon today and he wants to her to take Aspirin 81 mg daily to help with the healing of the artery. She states the home health team told her they could not use tape on her leg because of the thinness of her skin due to eliquis and she is having difficulty getting a bandage to stay in place over the lesion. She asked if she can stop or decrease the eliquis. I advised that per Dr. Acie Fredrickson, she is high risk for a DVT and needs to continue eliquis 5 mg BID. I advised that the eliquis 2.5 mg would not be effective for her due to the way the drug dose is calculated. I advised her to try to use a different method for getting the bandage to stay in place, maybe by attaching to her knee immobilizer. I advised Dr. Acie Fredrickson is in agreement for her to take Aspirin 81 mg daily. She verbalized understanding and agreement and thanked me for the call.

## 2017-06-18 NOTE — Progress Notes (Signed)
Subjective:     Patient ID: [name], female   DOB: 04/07/1946, 72 y.o.   MRN: [id number]  HPI 72 year old female follows up from right above-knee popliteal to below-knee popliteal bypass for trauma.  She is walking with the help of a walker and is in a knee immobilizer at the direction of orthopedics.  She did have a small wound at her distal incision being treated with Mehdi honey and was cultured for MRSA and she is completing a course of antibiotics no longer has fevers or cellulitis.  Her pain is a 1 out of 10 at this time.  She has not showered.   Review of Systems Right leg wound    Objective:   Physical Exam aaox3 Edema in right leg much improved Palpable right dp Incisions are mostly in tact with distal incision 2cm opening with exposed subq tissue, no cellulitis, wound is superficial without tracking    Assessment/plan     72 year old female follows up from right lower extremity bypass for trauma.  Staples are removed today there was some bleeding given that she is on Eliquis and a dry dressing was placed.  She will continue the medi-honey dressing with home health care and activity per orthopedics.  Given that there is a small wound I will see her back in 4 weeks and hopefully this will have healed at that time.  We will also get a duplex and ABIs at that time.  I have asked her to start taking 81 mg aspirin as well as long as she is not having bleeding or bruising issues with her Eliquis.  I have also told her she is okay to shower and she needs lotion on her skin as it has become quite dry.  She demonstrates good understanding will call if there are issues prior to 1 month.  Shawnika Pepin C. Donzetta Matters, MD [facility] Office: [phone] Pager: [phone]

## 2017-06-18 NOTE — Telephone Encounter (Signed)
New Message  Pt call requesting to speak with RN. Pt states she would rather discuss with RN. Please call back to discuss

## 2017-06-21 NOTE — Addendum Note (Signed)
Addended by: Lianne Cure A on: 06/21/2017 01:30 PM   Modules accepted: Orders

## 2017-07-02 DIAGNOSIS — Z96659 Presence of unspecified artificial knee joint: Secondary | ICD-10-CM | POA: Insufficient documentation

## 2017-07-02 DIAGNOSIS — M7062 Trochanteric bursitis, left hip: Secondary | ICD-10-CM | POA: Insufficient documentation

## 2017-07-03 ENCOUNTER — Encounter (HOSPITAL_COMMUNITY): Payer: Self-pay

## 2017-07-03 ENCOUNTER — Emergency Department (HOSPITAL_COMMUNITY)
Admission: EM | Admit: 2017-07-03 | Discharge: 2017-07-04 | Disposition: A | Discharge status: Home / Self Care | Payer: Medicare Other | Attending: Emergency Medicine | Admitting: Emergency Medicine

## 2017-07-03 DIAGNOSIS — E119 Type 2 diabetes mellitus without complications: Secondary | ICD-10-CM | POA: Diagnosis not present

## 2017-07-03 DIAGNOSIS — Z4889 Encounter for other specified surgical aftercare: Secondary | ICD-10-CM | POA: Diagnosis not present

## 2017-07-03 DIAGNOSIS — Z7982 Long term (current) use of aspirin: Secondary | ICD-10-CM | POA: Insufficient documentation

## 2017-07-03 DIAGNOSIS — R600 Localized edema: Secondary | ICD-10-CM | POA: Diagnosis not present

## 2017-07-03 DIAGNOSIS — I10 Essential (primary) hypertension: Secondary | ICD-10-CM | POA: Insufficient documentation

## 2017-07-03 DIAGNOSIS — R202 Paresthesia of skin: Secondary | ICD-10-CM | POA: Diagnosis present

## 2017-07-03 DIAGNOSIS — Z7901 Long term (current) use of anticoagulants: Secondary | ICD-10-CM | POA: Diagnosis not present

## 2017-07-03 DIAGNOSIS — Z882 Allergy status to sulfonamides status: Secondary | ICD-10-CM | POA: Insufficient documentation

## 2017-07-03 DIAGNOSIS — Z79899 Other long term (current) drug therapy: Secondary | ICD-10-CM | POA: Diagnosis not present

## 2017-07-03 NOTE — ED Notes (Signed)
Dr.Fields enroute to see patient; will be here in ~25mins

## 2017-07-03 NOTE — Consult Note (Signed)
Patient name: [name] MRN: [id number] DOB: [birth year] Sex: female    HPI: [name] is a 72 y.o. female s/p right above to below knee popliteal bypass by Dr Donzetta Matters about 6 weeks ago.  She had numbness that developed in her right foot that started about 630 pm today.  This has improved slightly but is still present.  She does not really describe claudication symptoms but does not really walk much.  She is on chronic Eliquis and asa and reports compliance with both meds.  She does have a history of chronic back pain but has never had numbness related to this.  No numbness in the left foot. Other medical problems include diabetes, afib, elevated cholesterol, hypertension all of which have been stable.   Past Medical History:  Diagnosis Date  . Abnormal Pap smear    Rare ASCUS-H  . Arthritis   . Atrial fibrillation ([place])   . Chest pain   . Diabetes mellitus without complication ([place])   . Hypercholesteremia   . Hypertension    per pt no htn but needs meds for other reason  . Obesity    Past Surgical History:  Procedure Laterality Date  . BYPASS GRAFT POPLITEAL TO POPLITEAL Right 05/17/2017   Procedure: BYPASS GRAFT ABOVE KNEE POPLITEAL TO BELOW KNEE POPLITEAL USING NONREVERSED RIGHT GREATER SAPHENOUS VEIN;  Surgeon: Waynetta Sandy, MD;  Location: [place];  Service: Vascular;  Laterality: Right;  . CARDIAC CATHETERIZATION  07/11/2010   Est. EF of 60-65% -- Smooth and normal coronary arteries  -- Normal LV systolic function   . CHOLECYSTECTOMY    . CYSTOCELE REPAIR  07/22/2010   Vault suspension, cystocele repair, graft and cystoscopy  . FINGER SURGERY    . NASAL SINUS SURGERY    . NEUROMA SURGERY     rt foot  . TONSILLECTOMY    . TOTAL KNEE ARTHROPLASTY  06/20/2009   left knee  . TOTAL KNEE ARTHROPLASTY  12/27/2008   right knee  . TOTAL VAGINAL HYSTERECTOMY  07/22/2010   with bilateral salpingo-oophorectomy by Dr. Joan Flores  . VEIN HARVEST Right 05/17/2017   Procedure: RIGHT GREATER SAPHENOUS VEIN HARVEST;  Surgeon: Waynetta Sandy, MD;  Location: [facility];  Service: Vascular;  Laterality: Right;    Family History  Problem Relation Age of Onset  . Emphysema Father   . Atrial fibrillation Mother        has pacemaker  . Diabetes Mother   . Hypertension Mother   . Other Brother        bulbar palsy    SOCIAL HISTORY: Social History   Socioeconomic History  . Marital status: Married    Spouse name: Not on file  . Number of children: Not on file  . Years of education: Not on file  . Highest education level: Not on file  Social Needs  . Financial resource strain: Not on file  . Food insecurity - worry: Not on file  . Food insecurity - inability: Not on file  . Transportation needs - medical: Not on file  . Transportation needs - non-medical: Not on file  Occupational History  . Not on file  Tobacco Use  . Smoking status: Never Smoker  . Smokeless tobacco: Never Used  Substance and Sexual Activity  . Alcohol use: No  . Drug use: No  . Sexual activity: Yes    Partners: Male    Birth control/protection: Surgical    Comment: LAVH  Other  Topics Concern  . Not on file  Social History Narrative  . Not on file    Allergies  Allergen Reactions  . Erythromycin     Nausea  . Plavix [Clopidogrel Bisulfate]     cough  . Sulfonamide Derivatives     Rash    No current facility-administered medications for this encounter.    Current Outpatient Medications  Medication Sig Dispense Refill  . ACCU-CHEK AVIVA PLUS test strip   0  . Ascorbic Acid (VITAMIN C) 1000 MG tablet Take 1,000 mg by mouth daily.    Marland Kitchen aspirin EC 81 MG tablet Take 81 mg by mouth daily.    Marland Kitchen BIOTIN PO Take 1 capsule by mouth at bedtime.     . Calcium Carbonate-Vit D-Min (GNP CALCIUM PLUS 600 +D PO) Take 1 tablet by mouth daily.     Marland Kitchen diltiazem (CARDIZEM CD) 240 MG 24 hr capsule TAKE 1 CAPSULE BY MOUTH DAILY. 90 capsule 3  . Docusate Calcium (STOOL  SOFTENER PO) Take 1 capsule by mouth at bedtime.     . Doxycycline Hyclate (DOXY-CAPS PO) Take by mouth.    Arne Cleveland 5 MG TABS tablet TAKE 1 TABLET BY MOUTH 2 TIMES DAILY. 60 tablet 6  . Flaxseed, Linseed, (FLAX SEED OIL) 1000 MG CAPS Take 1,000 mg by mouth daily.     Marland Kitchen loratadine (CLARITIN) 10 MG tablet Take 10 mg by mouth daily.    . mometasone (NASONEX) 50 MCG/ACT nasal spray Place 1 spray into the nose daily as needed for congestion.  0  . multivitamin (THERAGRAN) per tablet Take 1 tablet by mouth daily.      . Omega-3 Fatty Acids (FISH OIL PO) Take 1 capsule by mouth daily. 1200 mg daily    . oxyCODONE-acetaminophen (PERCOCET/ROXICET) 5-325 MG tablet Take 1 tablet by mouth every 6 (six) hours as needed for severe pain. 8 tablet 0  . PATADAY 0.2 % SOLN Place 1 drop into both eyes daily.   4  . potassium chloride (K-DUR) 10 MEQ tablet Take 1 tablet (10 mEq total) by mouth daily. Please keep 02/12/17 Appt for further refills 90 tablet 3  . pyridOXINE (VITAMIN B-6) 100 MG tablet Take 100 mg by mouth daily.     . ranitidine (ZANTAC) 150 MG tablet Take 75 mg by mouth at bedtime.       ROS:   General:  No Fever, chills    Physical Examination  Vitals:   07/03/17 2106 07/03/17 2337  BP: (!) 159/107 (!) 154/76  Pulse: 89 74  Resp: 18 16  Temp: 98.9 F (37.2 C)   TempSrc: Oral   SpO2: 96% 98%    There is no height or weight on file to calculate BMI.  General:  Alert and oriented, no acute distress Cardiac: Regular Rate and Rhythm  Skin: No rash, 2 cm x 2 cm x less than 1 mm depth wound right below knee incision granulating, right and left foot symmetrically warm Extremity Pulses:  2+ radial, brachial, femoral, dorsalis pedis pulses bilaterally Musculoskeletal: right pedal edema  2+ compared to left 1+ Neurologic: Upper and lower extremity motor 5/5 and symmetric  ASSESSMENT:  Patent bypass right leg, no evidence of ischemia, persistent edema not really changed over last few  days   PLAN:  Reassured patient that bypass is working.  Offer option of admission with duplex tomorrow morning versus our office on Monday morning.  She would prefer to go home. Will d/c home now.  She will call if symptoms worsen otherwise will come to our office Monday morning to venous duplex to rule out DVT should be low likelihood on anticoagulation.  We will also do a right leg duplex and ABI since this was scheduled for surveillance in a few weeks.   Ruta Hinds, MD [facility] Office: [phone] Pager: [phone]

## 2017-07-03 NOTE — ED Notes (Signed)
Per MD skip all lab work,  Pt will be discharged.

## 2017-07-03 NOTE — ED Triage Notes (Signed)
Pt states that she had vascular surgery on 12/10 on popliteal artery. Reports off and on numbness since surgery in R foot, minimally cooler to touch, no redness or swelling, weak pulse, present with doppler.

## 2017-07-03 NOTE — ED Notes (Signed)
Delay in lab draw,  MD currently at bedside. 

## 2017-07-04 NOTE — ED Notes (Signed)
ED Provider at bedside. 

## 2017-07-04 NOTE — Discharge Instructions (Signed)
1. Medications: usual home medications 2. Treatment: rest, drink plenty of fluids,  3. Follow Up: Please followup with vascular surgery for your doppler as scheduled on Monday; Please return to the ER for worsening symptoms, cool extremity or sensation changes

## 2017-07-04 NOTE — ED Provider Notes (Signed)
[facility] EMERGENCY DEPARTMENT Provider Note   CSN: [id number] Arrival date & time: 07/03/17  2056     History   Chief Complaint Chief Complaint  Patient presents with  . Post-op Problem    HPI [name] is a 72 y.o. female with a hx of paroxysmal A. Fib (on Eliquis and aspirin), hyperlipidemia, hyperglycemia, hypertension presents to the Emergency Department complaining of gradual, persistent, progressively improving numbness in the right foot that developed around 6:30 PM today.  Patient reports that his continued to get better but has not resolved.  Patient underwent below the knee popliteal bypass by Dr. Donzetta Matters approximately 6 weeks ago after a knee dislocation resulting from a fall.  She denies new or increasing pain with walking however reports that she is not doing a lot of walking.  She denies new or increased pain in the knee or leg.  She reports no missed doses of her aspirin or Eliquis.  She denies fevers or chills.  She denies weakness in the extremity.  Patient reports no symptoms in the left leg or either arm.  No headache or changes in vision.    The history is provided by the patient and medical records. No language interpreter was used.    Past Medical History:  Diagnosis Date  . Abnormal Pap smear    Rare ASCUS-H  . Arthritis   . Atrial fibrillation ([place])   . Chest pain   . Diabetes mellitus without complication ([place])   . Hypercholesteremia   . Hypertension    per pt no htn but needs meds for other reason  . Obesity     Patient Active Problem List   Diagnosis Date Noted  . Knee dislocation 05/17/2017  . Popliteal artery injury 05/17/2017  . Hypokalemia 05/17/2017  . Leukocytosis 05/17/2017  . Injury of right popliteal artery 05/17/2017  . HTN (hypertension) 07/02/2015  . Hyperlipidemia 02/04/2011  . Hyperglycemia 02/04/2011  . Leg edema 02/04/2011  . PAF (paroxysmal atrial fibrillation) ([place]) 02/02/2011  . Leg swelling  02/02/2011    Past Surgical History:  Procedure Laterality Date  . BYPASS GRAFT POPLITEAL TO POPLITEAL Right 05/17/2017   Procedure: BYPASS GRAFT ABOVE KNEE POPLITEAL TO BELOW KNEE POPLITEAL USING NONREVERSED RIGHT GREATER SAPHENOUS VEIN;  Surgeon: Waynetta Sandy, MD;  Location: [facility];  Service: Vascular;  Laterality: Right;  . CARDIAC CATHETERIZATION  07/11/2010   Est. EF of 60-65% -- Smooth and normal coronary arteries  -- Normal LV systolic function   . CHOLECYSTECTOMY    . CYSTOCELE REPAIR  07/22/2010   Vault suspension, cystocele repair, graft and cystoscopy  . FINGER SURGERY    . NASAL SINUS SURGERY    . NEUROMA SURGERY     rt foot  . TONSILLECTOMY    . TOTAL KNEE ARTHROPLASTY  06/20/2009   left knee  . TOTAL KNEE ARTHROPLASTY  12/27/2008   right knee  . TOTAL VAGINAL HYSTERECTOMY  07/22/2010   with bilateral salpingo-oophorectomy by Dr. Joan Flores  . VEIN HARVEST Right 05/17/2017   Procedure: RIGHT GREATER SAPHENOUS VEIN HARVEST;  Surgeon: Waynetta Sandy, MD;  Location: [facility] OR;  Service: Vascular;  Laterality: Right;    OB History    Gravida Para Term Preterm AB Living   2 2 2     2    SAB TAB Ectopic Multiple Live Births           2       Home Medications    Prior  to Admission medications   Medication Sig Start Date End Date Taking? Authorizing Provider  ACCU-CHEK AVIVA PLUS test strip  05/27/16   [provider]  Ascorbic Acid (VITAMIN C) 1000 MG tablet Take 1,000 mg by mouth daily.    [provider]  aspirin EC 81 MG tablet Take 81 mg by mouth daily.    [provider]  BIOTIN PO Take 1 capsule by mouth at bedtime.     [provider]  Calcium Carbonate-Vit D-Min (GNP CALCIUM PLUS 600 +D PO) Take 1 tablet by mouth daily.     [provider]  diltiazem (CARDIZEM CD) 240 MG 24 hr capsule TAKE 1 CAPSULE BY MOUTH DAILY. 06/16/16   Nahser, Wonda Cheng, MD  Docusate Calcium (STOOL SOFTENER PO) Take 1 capsule by  mouth at bedtime.     [provider]  Doxycycline Hyclate (DOXY-CAPS PO) Take by mouth.    [provider]  ELIQUIS 5 MG TABS tablet TAKE 1 TABLET BY MOUTH 2 TIMES DAILY. 02/24/17   Nahser, Wonda Cheng, MD  Flaxseed, Linseed, (FLAX SEED OIL) 1000 MG CAPS Take 1,000 mg by mouth daily.     [provider]  loratadine (CLARITIN) 10 MG tablet Take 10 mg by mouth daily.    [provider]  mometasone (NASONEX) 50 MCG/ACT nasal spray Place 1 spray into the nose daily as needed for congestion. 01/05/17   [provider]  multivitamin [facility]) per tablet Take 1 tablet by mouth daily.      [provider]  Omega-3 Fatty Acids (FISH OIL PO) Take 1 capsule by mouth daily. 1200 mg daily    [provider]  oxyCODONE-acetaminophen (PERCOCET/ROXICET) 5-325 MG tablet Take 1 tablet by mouth every 6 (six) hours as needed for severe pain. 05/22/17   Dagoberto Ligas, PA-C  PATADAY 0.2 % SOLN Place 1 drop into both eyes daily.  04/27/16   [provider]  potassium chloride (K-DUR) 10 MEQ tablet Take 1 tablet (10 mEq total) by mouth daily. Please keep 02/12/17 Appt for further refills 02/12/17   Nahser, Wonda Cheng, MD  pyridOXINE (VITAMIN B-6) 100 MG tablet Take 100 mg by mouth daily.     [provider]  ranitidine (ZANTAC) 150 MG tablet Take 75 mg by mouth at bedtime.     [provider]  diltiazem (DILACOR XR) 240 MG 24 hr capsule Take 1 capsule (240 mg total) by mouth daily. 07/07/12 07/05/13  Nahser, Wonda Cheng, MD    Family History Family History  Problem Relation Age of Onset  . Emphysema Father   . Atrial fibrillation Mother        has pacemaker  . Diabetes Mother   . Hypertension Mother   . Other Brother        bulbar palsy    Social History Social History   Tobacco Use  . Smoking status: Never Smoker  . Smokeless tobacco: Never Used  Substance Use Topics  . Alcohol use: No  . Drug use: No     Allergies     Erythromycin; Plavix [clopidogrel bisulfate]; and Sulfonamide derivatives   Review of Systems Review of Systems  Constitutional: Negative for appetite change, diaphoresis, fatigue, fever and unexpected weight change.  HENT: Negative for mouth sores.   Eyes: Negative for visual disturbance.  Respiratory: Negative for cough, chest tightness, shortness of breath and wheezing.   Cardiovascular: Negative for chest pain.  Gastrointestinal: Negative for abdominal pain, constipation, diarrhea, nausea and vomiting.  Endocrine: Negative for polydipsia, polyphagia and polyuria.  Genitourinary: Negative for dysuria, frequency, hematuria and urgency.  Musculoskeletal: Negative for back pain and neck stiffness.  Skin: Negative for rash.  Allergic/Immunologic: Negative for immunocompromised state.  Neurological: Positive for numbness ( Right foot). Negative for syncope, light-headedness and headaches.  Hematological: Does not bruise/bleed easily.  Psychiatric/Behavioral: Negative for sleep disturbance. The patient is not nervous/anxious.      Physical Exam Updated Vital Signs BP (!) 154/76 (BP Location: Right Arm)   Pulse 74   Temp 98.9 F (37.2 C) (Oral)   Resp 16   LMP 07/22/2010   SpO2 98%   Physical Exam  Constitutional: She appears well-developed and well-nourished. No distress.  HENT:  Head: Normocephalic and atraumatic.  Eyes: Conjunctivae are normal.  Neck: Normal range of motion.  Cardiovascular: Normal rate, regular rhythm and intact distal pulses.  Pulses:      Dorsalis pedis pulses are 2+ on the right side, and 2+ on the left side.  Capillary refill < 3 sec  Pulmonary/Chest: Effort normal and breath sounds normal.  Abdominal: Soft. Bowel sounds are normal. She exhibits no distension.  Musculoskeletal: She exhibits edema. She exhibits no tenderness.  Right lower extremity with 2+ pitting edema.  Full range of motion of the hip, ankle and all toes.  Right knee with bandage  and brace in place.  Foot is warm to touch.  No erythema or streaking.  No significant tenderness to palpation.  No calf tenderness.  Left lower extremity with 1+ edema throughout the lower extremity.  Full range of motion of the hip, knee, ankle, toes.  Foot is warm to touch.  Neurological: She is alert. Coordination normal.  Sensation intact to light touch normal in the left lower extremity and at baseline since surgery in the right lower extremity Strength 5/5 in the bilateral lower extremities with dorsiflexion and plantarflexion Cranial nerves are grossly intact. Moves all extremities without ataxia.  Gross and fine motor are intact in the upper extremities.  Skin: Skin is warm and dry. She is not diaphoretic.  No tenting of the skin  Psychiatric: She has a normal mood and affect.  Nursing note and vitals reviewed.    ED Treatments / Results  Labs (all labs ordered are listed, but only abnormal results are displayed) Labs Reviewed  CBC WITH DIFFERENTIAL/PLATELET  COMPREHENSIVE METABOLIC PANEL  PROTIME-INR  I-STAT CHEM 8, ED     Procedures Procedures (including critical care time)  Medications Ordered in ED Medications - No data to display   Initial Impression / Assessment and Plan / ED Course  I have reviewed the triage vital signs and the nursing notes.  Pertinent labs & imaging results that were available during my care of the patient were reviewed by me and considered in my medical decision making (see chart for details).  Clinical Course as of Jul 04 52  Sun Jul 04, 2017  0017 Pt seen by Dr. Oneida Alar who recommends d/c home with outpatient vascular duplex on Monday  [HM]    Clinical Course User Index [HM] Desani Sprung, Jarrett Soho, [place]    Patient presents with numbness in the right foot tonight.  No signs of stroke.  No back pain.  No other focal complaints.  No weakness.  Patient is anticoagulated.  Patient with recent popliteal bypass graft.  There is no evidence  of ischemia in the leg.  Patient was evaluated by vascular surgery, Dr. fields who confirms that graft is patent.  He offered  overnight admission for venous duplex in the morning versus outpatient follow-up with venous duplex Monday morning.  Patient wished to be discharged home.  She will follow-up on Monday with vascular surgery for reevaluation.  Discussed reasons to return immediately to the emergency department.  Patient states understanding and is in agreement with this plan.  Final Clinical Impressions(s) / ED Diagnoses   Final diagnoses:  Right leg paresthesias    ED Discharge Orders    None       Loni Muse Gwenlyn Perking 07/04/17 0054    Palumbo, April, MD 07/04/17 Nicolasa Ducking, April, MD 07/04/17 1219

## 2017-07-05 ENCOUNTER — Other Ambulatory Visit: Payer: Self-pay

## 2017-07-05 ENCOUNTER — Ambulatory Visit (HOSPITAL_COMMUNITY)
Admission: RE | Admit: 2017-07-05 | Discharge: 2017-07-05 | Disposition: A | Discharge status: Home / Self Care | Payer: Medicare Other | Source: Ambulatory Visit | Attending: Vascular Surgery | Admitting: Vascular Surgery

## 2017-07-05 DIAGNOSIS — M79661 Pain in right lower leg: Secondary | ICD-10-CM

## 2017-07-05 DIAGNOSIS — Z9889 Other specified postprocedural states: Secondary | ICD-10-CM | POA: Insufficient documentation

## 2017-07-06 ENCOUNTER — Other Ambulatory Visit: Payer: Self-pay | Admitting: Cardiovascular Disease

## 2017-07-16 ENCOUNTER — Ambulatory Visit (HOSPITAL_COMMUNITY)
Admission: RE | Admit: 2017-07-16 | Discharge: 2017-07-16 | Disposition: A | Discharge status: Home / Self Care | Payer: Medicare Other | Source: Ambulatory Visit | Attending: Family | Admitting: Family

## 2017-07-16 ENCOUNTER — Ambulatory Visit (HOSPITAL_COMMUNITY): Admission: RE | Admit: 2017-07-16 | Payer: Medicare Other | Source: Ambulatory Visit

## 2017-07-16 DIAGNOSIS — S85001D Unspecified injury of popliteal artery, right leg, subsequent encounter: Secondary | ICD-10-CM

## 2017-07-16 DIAGNOSIS — Z9889 Other specified postprocedural states: Secondary | ICD-10-CM | POA: Insufficient documentation

## 2017-07-16 DIAGNOSIS — X58XXXD Exposure to other specified factors, subsequent encounter: Secondary | ICD-10-CM | POA: Diagnosis not present

## 2017-07-19 LAB — VAS US LOWER EXTREMITY BYPASS GRAFT DUPLEX: Right super femoral prox sys PSV: 110 cm/s

## 2017-07-23 ENCOUNTER — Other Ambulatory Visit: Payer: Self-pay

## 2017-07-23 ENCOUNTER — Encounter: Payer: Self-pay | Admitting: Vascular Surgery

## 2017-07-23 ENCOUNTER — Ambulatory Visit (INDEPENDENT_AMBULATORY_CARE_PROVIDER_SITE_OTHER): Payer: Medicare Other | Admitting: Vascular Surgery

## 2017-07-23 VITALS — BP 131/85 | HR 79 | Temp 97.8°F | Resp 16 | Ht 68.5 in | Wt 297.0 lb

## 2017-07-23 DIAGNOSIS — I868 Varicose veins of other specified sites: Secondary | ICD-10-CM

## 2017-07-23 DIAGNOSIS — S85001D Unspecified injury of popliteal artery, right leg, subsequent encounter: Secondary | ICD-10-CM

## 2017-07-23 NOTE — Progress Notes (Signed)
History of Present Illness:  Patient is a 72 y.o. year old female who presents for a follow up visit.  She is s/p right   Above knee to below knee bypass after traumatic injury.  She is on chronic Eliquis for A fib.  She is now ambulating without her knee brace and a straight cane.  Her below knee incisional wound is healing with continued care using Meda honey.    There are no changes in her medical history since her last visit.   Past Medical History:  Diagnosis Date  . Abnormal Pap smear    Rare ASCUS-H  . Arthritis   . Atrial fibrillation ([place])   . Chest pain   . Diabetes mellitus without complication ([place])   . Hypercholesteremia   . Hypertension    per pt no htn but needs meds for other reason  . Obesity     Past Surgical History:  Procedure Laterality Date  . BYPASS GRAFT POPLITEAL TO POPLITEAL Right 05/17/2017   Procedure: BYPASS GRAFT ABOVE KNEE POPLITEAL TO BELOW KNEE POPLITEAL USING NONREVERSED RIGHT GREATER SAPHENOUS VEIN;  Surgeon: Waynetta Sandy, MD;  Location: [place];  Service: Vascular;  Laterality: Right;  . CARDIAC CATHETERIZATION  07/11/2010   Est. EF of 60-65% -- Smooth and normal coronary arteries  -- Normal LV systolic function   . CHOLECYSTECTOMY    . CYSTOCELE REPAIR  07/22/2010   Vault suspension, cystocele repair, graft and cystoscopy  . FINGER SURGERY    . NASAL SINUS SURGERY    . NEUROMA SURGERY     rt foot  . TONSILLECTOMY    . TOTAL KNEE ARTHROPLASTY  06/20/2009   left knee  . TOTAL KNEE ARTHROPLASTY  12/27/2008   right knee  . TOTAL VAGINAL HYSTERECTOMY  07/22/2010   with bilateral salpingo-oophorectomy by Dr. Joan Flores  . VEIN HARVEST Right 05/17/2017   Procedure: RIGHT GREATER SAPHENOUS VEIN HARVEST;  Surgeon: Waynetta Sandy, MD;  Location[facility];  Service: Vascular;  Laterality: Right;     Social History Social History   Tobacco Use  . Smoking status: Never Smoker  . Smokeless tobacco: Never Used  Substance  Use Topics  . Alcohol use: No  . Drug use: No    Family History Family History  Problem Relation Age of Onset  . Emphysema Father   . Atrial fibrillation Mother        has pacemaker  . Diabetes Mother   . Hypertension Mother   . Other Brother        bulbar palsy    Allergies  Allergies  Allergen Reactions  . Erythromycin     Nausea  . Plavix [Clopidogrel Bisulfate]     cough  . Sulfonamide Derivatives     Rash     Current Outpatient Medications  Medication Sig Dispense Refill  . ACCU-CHEK AVIVA PLUS test strip   0  . Ascorbic Acid (VITAMIN C) 1000 MG tablet Take 1,000 mg by mouth daily.    Marland Kitchen aspirin EC 81 MG tablet Take 81 mg by mouth daily.    Marland Kitchen BIOTIN PO Take 1 capsule by mouth at bedtime.     . Calcium Carbonate-Vit D-Min (GNP CALCIUM PLUS 600 +D PO) Take 1 tablet by mouth daily.     Marland Kitchen diltiazem (CARDIZEM CD) 240 MG 24 hr capsule TAKE 1 CAPSULE BY MOUTH DAILY. 90 capsule 1  . Docusate Calcium (STOOL SOFTENER PO) Take 1 capsule by mouth at bedtime.     Marland Kitchen  Doxycycline Hyclate (DOXY-CAPS PO) Take by mouth.    Arne Cleveland 5 MG TABS tablet TAKE 1 TABLET BY MOUTH 2 TIMES DAILY. 60 tablet 6  . Flaxseed, Linseed, (FLAX SEED OIL) 1000 MG CAPS Take 1,000 mg by mouth daily.     Marland Kitchen loratadine (CLARITIN) 10 MG tablet Take 10 mg by mouth daily.    . mometasone (NASONEX) 50 MCG/ACT nasal spray Place 1 spray into the nose daily as needed for congestion.  0  . multivitamin (THERAGRAN) per tablet Take 1 tablet by mouth daily.      . Omega-3 Fatty Acids (FISH OIL PO) Take 1 capsule by mouth daily. 1200 mg daily    . oxyCODONE-acetaminophen (PERCOCET/ROXICET) 5-325 MG tablet Take 1 tablet by mouth every 6 (six) hours as needed for severe pain. 8 tablet 0  . PATADAY 0.2 % SOLN Place 1 drop into both eyes daily.   4  . potassium chloride (K-DUR) 10 MEQ tablet Take 1 tablet (10 mEq total) by mouth daily. Please keep 02/12/17 Appt for further refills 90 tablet 3  . pyridOXINE (VITAMIN B-6) 100  MG tablet Take 100 mg by mouth daily.     . ranitidine (ZANTAC) 150 MG tablet Take 75 mg by mouth at bedtime.      No current facility-administered medications for this visit.     ROS:   General:  No weight loss, Fever, chills  HEENT: No recent headaches, no nasal bleeding, no visual changes, no sore throat  Neurologic: No dizziness, blackouts, seizures. No recent symptoms of stroke or mini- stroke. No recent episodes of slurred speech, or temporary blindness.  Cardiac: No recent episodes of chest pain/pressure, no shortness of breath at rest.  No shortness of breath with exertion.  positive history of atrial fibrillation or irregular heartbeat  Vascular: No history of rest pain in feet.  No history of claudication.  No history of non-healing ulcer, No history of DVT   Pulmonary: No home oxygen, no productive cough, no hemoptysis,  No asthma or wheezing  Musculoskeletal:  [ x] Arthritis, [ ]  Low back pain,  [ x] Joint pain  Hematologic:No history of hypercoagulable state.  No history of easy bleeding.  No history of anemia  Gastrointestinal: No hematochezia or melena,  No gastroesophageal reflux, no trouble swallowing  Urinary: [ ]  chronic Kidney disease, [ ]  on HD - [ ]  MWF or [ ]  TTHS, [ ]  Burning with urination, [ ]  Frequent urination, [ ]  Difficulty urinating;   Skin: No rashes  Psychological: No history of anxiety,  No history of depression   Physical Examination  There were no vitals filed for this visit.  There is no height or weight on file to calculate BMI.  General:  Alert and oriented, no acute distress HEENT: Normal Neck: No bruit or JVD Pulmonary: Clear to auscultation bilaterally Cardiac: Regular Rate and Rhythm without murmur Gastrointestinal: Soft, non-tender, non-distended, no mass, no scars Skin: No rash Extremity Pulses:  2+ radial, brachial pulses bilaterally Musculoskeletal: No deformity or edema  Neurologic: Upper and left lower extremity motor  5/5 and symmetric, right lower 4/5 and decreased range of motion  DATA:  ABI 98% Bilateral  Triphasic flow B LE   ASSESSMENT:  S/P right above knee to below knee bypass after traumatic fall Fracture right TKA   PLAN: The bypass in patent and she has palpable DP pulse.  She continues to have significant edema in the right LE as well as chronic edema in the  left.  We are ordering fitted compression garments to be worn daily.  We encourage LE elevation when at rest.  She will follow up in 6 months with repeat ABI's and bypass duplex.  She is being followed by Dr. Alvan Dame for her TKA.  Roxy Horseman PA-C [facility]  The patient was seen today in conjunction with Dr. Donzetta Matters

## 2017-07-26 ENCOUNTER — Other Ambulatory Visit: Payer: Self-pay

## 2017-07-26 DIAGNOSIS — S85001A Unspecified injury of popliteal artery, right leg, initial encounter: Secondary | ICD-10-CM

## 2017-07-27 ENCOUNTER — Ambulatory Visit: Payer: Medicare Other

## 2017-07-28 DIAGNOSIS — M25661 Stiffness of right knee, not elsewhere classified: Secondary | ICD-10-CM | POA: Insufficient documentation

## 2017-08-05 ENCOUNTER — Ambulatory Visit
Admission: RE | Admit: 2017-08-05 | Discharge: 2017-08-05 | Disposition: A | Discharge status: Home / Self Care | Payer: Medicare Other | Source: Ambulatory Visit | Attending: Certified Nurse Midwife | Admitting: Certified Nurse Midwife

## 2017-08-05 ENCOUNTER — Other Ambulatory Visit: Payer: Self-pay | Admitting: Certified Nurse Midwife

## 2017-08-05 DIAGNOSIS — Z1231 Encounter for screening mammogram for malignant neoplasm of breast: Secondary | ICD-10-CM

## 2017-08-11 DIAGNOSIS — M25552 Pain in left hip: Secondary | ICD-10-CM | POA: Insufficient documentation

## 2017-09-08 ENCOUNTER — Encounter: Payer: Self-pay | Admitting: Cardiovascular Disease

## 2017-09-08 ENCOUNTER — Ambulatory Visit: Payer: Medicare Other | Admitting: Cardiovascular Disease

## 2017-09-08 ENCOUNTER — Other Ambulatory Visit: Payer: Medicare Other

## 2017-09-08 VITALS — BP 132/78 | HR 64 | Ht 68.0 in | Wt 293.8 lb

## 2017-09-08 DIAGNOSIS — I480 Paroxysmal atrial fibrillation: Secondary | ICD-10-CM

## 2017-09-08 DIAGNOSIS — I10 Essential (primary) hypertension: Secondary | ICD-10-CM

## 2017-09-08 DIAGNOSIS — E782 Mixed hyperlipidemia: Secondary | ICD-10-CM

## 2017-09-08 NOTE — Progress Notes (Signed)
Cardiology Office Note   Date:  09/08/2017   ID:  [name], DOB 13-Dec-1945, MRN [id number]  PCP:  Leonard Downing, MD  Cardiologist:   Mertie Moores, MD   Chief Complaint  Patient presents with  . Atrial Fibrillation  . Hypertension       [name] is a 72 y.o. female who presents for follow up of her paroxysmal atrial fib  1. Hypertension 2. History of atrial fibrillation 3. Hyperlipidemia 4.  Knee replacement 5.  melanoma       [name] is doing well. She has decreased her dose of Maxzide and her BP has been elevated. She has not been exercising on a regular basis but has been trying to exercise more since New's Years Day. She has lost 6 pounds. She has not had any chest pain or dyspnea.   She did feel a little bit of extra salt recently.   January 02, 2013:  [name] has done well. She had some various aches and pains (chest , back) in Feb. The pains improved with omeprazol but it caused excessive gas and bloating. Her lipids have been minimally elevated.   07/05/2013:  [name] is doing well. She has a sinus infection recently.   January 01, 2014:  [name] is doing well. She has had some sinus infections.   Jan. 25, 2016:  [name] is doing well. No cardiac issues.  Has had a few palpitations - heart racing.  But her HR would be normal if she takes her pulse.  Rides her stationary bike 30 mninutes a day.   Jan. 24, 2017:  [name] is seen for follow up of her atrial fib.  CHADS2VASC = 3 ( female, HTN, AGE > 9)  She has PAF - was in NSR at her last visit. Is back in A-Fib today .  Asymptomatic.  No CP or dyspnea  Has occasional right sided chest pain .   Has right shoulder pain and is seeing Dr. Onnie Graham soon Had a Myoivew in 2012 - ( inf. Defect) .  Subsequent cath showed normal coronary arteries.   December 19, 2015: [name] is back in NSR today  Has PAF , has never needed cardioversion. Takes propranolol as needed. Has some dizziness /  orthostasis  Some of her dizziness is not related to orthostasis   Jan. 29, 2018:  [name] is seen today .    Has been having problems with her hemmorhoids.   Asked about watchman  I have suggested that she get her hemorrhoids fixed and we resume anticoagulation shortly thereafter.  She has occasional episodes of brief presyncope. Typically occur if she sitting down. Has never had complete syncope   Sept. 7 , 2018:  Has had several episodes of lightneadedness. Typically when she just stands up.   Also can have lightheadedness when she turns her head Has not had true syncope Has run out of potassium 2 weeks ago   September 08, 2017:  [name] is seen today  Dislocated her Right knee,   Required vascular surgery ( popliteal artery rupture) Was in rehab for a month Developed MSRS Was then found to have melanoma  Bilateral cataract removal.   Needs to have left hip replacement but they will not do it until she has lost about 25 lbs.     Past Medical History:  Diagnosis Date  . Abnormal Pap smear    Rare ASCUS-H  . Arthritis   . Atrial fibrillation ([place])   . Chest pain   .  Diabetes mellitus without complication ([place])   . Hypercholesteremia   . Hypertension    per pt no htn but needs meds for other reason  . Obesity     Past Surgical History:  Procedure Laterality Date  . BYPASS GRAFT POPLITEAL TO POPLITEAL Right 05/17/2017   Procedure: BYPASS GRAFT ABOVE KNEE POPLITEAL TO BELOW KNEE POPLITEAL USING NONREVERSED RIGHT GREATER SAPHENOUS VEIN;  Surgeon: Waynetta Sandy, MD;  Location: [facility];  Service: Vascular;  Laterality: Right;  . CARDIAC CATHETERIZATION  07/11/2010   Est. EF of 60-65% -- Smooth and normal coronary arteries  -- Normal LV systolic function   . CHOLECYSTECTOMY    . CYSTOCELE REPAIR  07/22/2010   Vault suspension, cystocele repair, graft and cystoscopy  . FINGER SURGERY    . NASAL SINUS SURGERY    . NEUROMA SURGERY     rt foot  . TONSILLECTOMY      . TOTAL KNEE ARTHROPLASTY  06/20/2009   left knee  . TOTAL KNEE ARTHROPLASTY  12/27/2008   right knee  . TOTAL VAGINAL HYSTERECTOMY  07/22/2010   with bilateral salpingo-oophorectomy by Dr. Joan Flores  . VEIN HARVEST Right 05/17/2017   Procedure: RIGHT GREATER SAPHENOUS VEIN HARVEST;  Surgeon: Waynetta Sandy, MD;  Location: [facility];  Service: Vascular;  Laterality: Right;     Current Outpatient Medications  Medication Sig Dispense Refill  . ACCU-CHEK AVIVA PLUS test strip   0  . Ascorbic Acid (VITAMIN C) 1000 MG tablet Take 1,000 mg by mouth daily.    Marland Kitchen aspirin EC 81 MG tablet Take 81 mg by mouth daily.    Marland Kitchen BIOTIN PO Take 1 capsule by mouth at bedtime.     . Calcium Carbonate-Vit D-Min (GNP CALCIUM PLUS 600 +D PO) Take 1 tablet by mouth daily.     Marland Kitchen diltiazem (CARDIZEM CD) 240 MG 24 hr capsule TAKE 1 CAPSULE BY MOUTH DAILY. 90 capsule 1  . Docusate Calcium (STOOL SOFTENER PO) Take 1 capsule by mouth at bedtime.     . Doxycycline Hyclate (DOXY-CAPS PO) Take 100 mg by mouth daily.     Marland Kitchen ELIQUIS 5 MG TABS tablet TAKE 1 TABLET BY MOUTH 2 TIMES DAILY. 60 tablet 6  . Flaxseed, Linseed, (FLAX SEED OIL) 1000 MG CAPS Take 1,000 mg by mouth daily.     Marland Kitchen glucose blood test strip Accu-Chek Aviva Plus test strips    . loratadine (CLARITIN) 10 MG tablet Take 10 mg by mouth daily.    . mometasone (NASONEX) 50 MCG/ACT nasal spray Place 1 spray into the nose daily as needed for congestion.  0  . multivitamin (THERAGRAN) per tablet Take 1 tablet by mouth daily.      . Omega-3 Fatty Acids (FISH OIL PO) Take 1 capsule by mouth daily. 1200 mg daily    . oxyCODONE-acetaminophen (PERCOCET/ROXICET) 5-325 MG tablet Take 1 tablet by mouth every 6 (six) hours as needed for severe pain. 8 tablet 0  . PATADAY 0.2 % SOLN Place 1 drop into both eyes daily.   4  . potassium chloride (K-DUR) 10 MEQ tablet Take 1 tablet (10 mEq total) by mouth daily. Please keep 02/12/17 Appt for further refills 90 tablet 3  .  PREDNISOLON-GATIFLOX-BROMFENAC OP Apply 1 drop to eye 4 (four) times daily.    Marland Kitchen pyridOXINE (VITAMIN B-6) 100 MG tablet Take 100 mg by mouth daily.     . ranitidine (ZANTAC) 150 MG tablet Take 75 mg by mouth at bedtime.     Marland Kitchen  traMADol (ULTRAM) 50 MG tablet Take 50 mg by mouth every 6 (six) hours as needed for moderate pain.     No current facility-administered medications for this visit.     Allergies:   Atenolol; Erythromycin; Erythromycin base; Plavix [clopidogrel bisulfate]; Sulfa antibiotics; and Sulfonamide derivatives    Social History:  The patient  reports that she has never smoked. She has never used smokeless tobacco. She reports that she does not drink alcohol or use drugs.   Family History:  The patient's family history includes Atrial fibrillation in her mother; Diabetes in her mother; Emphysema in her father; Hypertension in her mother; Other in her brother.    ROS:  Please see the history of present illness.   Otherwise, review of systems are positive for none.   All other systems are reviewed and negative.   Physical Exam: Blood pressure 132/78, pulse 64, height 5\' 8"  (1.727 m), weight 293 lb 12 oz (133.2 kg), last menstrual period 07/22/2010.  GEN:  Middle age, obese female,  HEENT: Normal NECK: No JVD; No carotid bruits LYMPHATICS: No lymphadenopathy CARDIAC: RRR   RESPIRATORY:  Clear to auscultation without rales, wheezing or rhonchi  ABDOMEN: Soft, non-tender, non-distended MUSCULOSKELETAL:  No edema; No deformity  SKIN: Warm and dry NEUROLOGIC:  Alert and oriented x 3   EKG:     Recent Labs: 05/17/2017: Magnesium 1.9 05/18/2017: ALT 23 05/22/2017: BUN 15; Creatinine, Ser 0.72; Hemoglobin 10.7; Platelets 273; Potassium 4.0; Sodium 137    Lipid Panel    Component Value Date/Time   CHOL 182 06/15/2016 1015   TRIG 108 06/15/2016 1015   HDL 47 06/15/2016 1015   CHOLHDL 3.9 06/15/2016 1015   CHOLHDL 3.4 07/02/2015 0737   VLDL 16 07/02/2015 0737    LDLCALC 113 (H) 06/15/2016 1015      Wt Readings from Last 3 Encounters:  09/08/17 293 lb 12 oz (133.2 kg)  07/23/17 297 lb (134.7 kg)  06/18/17 293 lb (132.9 kg)      Other studies Reviewed: Additional studies/ records that were reviewed today include: . Review of the above records demonstrates:    ASSESSMENT AND PLAN:  1.  Paroxysmal atrial fib:    Has maintained normal sinus rhythm.  Continue Eliquis.  Continue diltiazem.  2. Hypertension:   -Blood pressure seems to be fairly well controlled.  Continue current medications.  She needs to lose some weight.  3. Hyperlipidemia:    4. Obesity:    Encourage diet and weight loss.   Current medicines are reviewed at length with the patient today.  The patient does not have concerns regarding medicines.  The following changes have been made:  no change  Labs/ tests ordered today include:  No orders of the defined types were placed in this encounter.  Disposition:   FU with me in 6 months   Signed, Mertie Moores, MD  09/08/2017 3:48 PM    [facility] [place], [place],   [address] Phone: [phone]; Fax: [phone]

## 2017-09-08 NOTE — Patient Instructions (Signed)
Medication Instructions:  Your physician recommends that you continue on your current medications as directed. Please refer to the Current Medication list given to you today.   Labwork: Your physician recommends that you return for lab work on Friday April 5 You will need to FAST for this appointment - nothing to eat or drink after midnight the night before except water.   Testing/Procedures: None Ordered   Follow-Up: Your physician wants you to follow-up in: 6 months with Dr. Acie Fredrickson. You will receive a reminder letter in the mail two months in advance. If you don't receive a letter, please call our office to schedule the follow-up appointment.   If you need a refill on your cardiac medications before your next appointment, please call your pharmacy.   Thank you for choosing [facility]! Christen Bame, RN [phone]

## 2017-09-10 ENCOUNTER — Other Ambulatory Visit: Payer: Medicare Other | Admitting: *Deleted

## 2017-09-10 DIAGNOSIS — E782 Mixed hyperlipidemia: Secondary | ICD-10-CM

## 2017-09-10 DIAGNOSIS — I10 Essential (primary) hypertension: Secondary | ICD-10-CM

## 2017-09-10 DIAGNOSIS — I480 Paroxysmal atrial fibrillation: Secondary | ICD-10-CM

## 2017-09-10 LAB — BASIC METABOLIC PANEL
BUN/Creatinine Ratio: 24 (ref 12–28)
BUN: 19 mg/dL (ref 8–27)
CALCIUM: 9.5 mg/dL (ref 8.7–10.3)
CO2: 26 mmol/L (ref 20–29)
Chloride: 103 mmol/L (ref 96–106)
Creatinine, Ser: 0.78 mg/dL (ref 0.57–1.00)
GFR calc Af Amer: 88 mL/min/{1.73_m2} (ref >59)
GFR, EST NON AFRICAN AMERICAN: 77 mL/min/{1.73_m2} (ref >59)
GLUCOSE: 111 mg/dL — AB (ref 65–99)
Potassium: 3.4 mmol/L — ABNORMAL LOW (ref 3.5–5.2)
Sodium: 142 mmol/L (ref 134–144)

## 2017-09-10 LAB — LIPID PANEL
CHOLESTEROL TOTAL: 191 mg/dL (ref 100–199)
Chol/HDL Ratio: 3.5 ratio (ref 0.0–4.4)
HDL: 54 mg/dL (ref >39)
LDL Calculated: 115 mg/dL — ABNORMAL HIGH (ref 0–99)
Triglycerides: 112 mg/dL (ref 0–149)
VLDL Cholesterol Cal: 22 mg/dL (ref 5–40)

## 2017-09-10 LAB — HEPATIC FUNCTION PANEL
ALK PHOS: 74 IU/L (ref 39–117)
ALT: 22 IU/L (ref 0–32)
AST: 21 IU/L (ref 0–40)
Albumin: 3.9 g/dL (ref 3.5–4.8)
BILIRUBIN TOTAL: 0.4 mg/dL (ref 0.0–1.2)
BILIRUBIN, DIRECT: 0.15 mg/dL (ref 0.00–0.40)
TOTAL PROTEIN: 6.1 g/dL (ref 6.0–8.5)

## 2017-09-10 LAB — TSH: TSH: 2.96 u[IU]/mL (ref 0.450–4.500)

## 2017-09-13 ENCOUNTER — Telehealth: Payer: Self-pay | Admitting: Nurse Practitioner

## 2017-09-13 NOTE — Telephone Encounter (Signed)
Called patient regarding a notification received from [facility] that had triamterene/HCTZ listed for patient which was not on my list. I reviewed her medication list with her and reviewed her lab results and she thanked me for the call.

## 2017-10-12 ENCOUNTER — Other Ambulatory Visit: Payer: Self-pay | Admitting: Cardiovascular Disease

## 2017-10-21 ENCOUNTER — Telehealth: Payer: Self-pay | Admitting: Cardiovascular Disease

## 2017-10-21 DIAGNOSIS — E782 Mixed hyperlipidemia: Secondary | ICD-10-CM

## 2017-10-21 DIAGNOSIS — I480 Paroxysmal atrial fibrillation: Secondary | ICD-10-CM

## 2017-10-21 NOTE — Telephone Encounter (Signed)
New Message  Pt wants to know if she needs labs and if so can she have it for a Monday prior to her appt on 9/25. Please call

## 2017-10-26 NOTE — Telephone Encounter (Signed)
Lab appointment scheduled for September 23 and orders placed in epic

## 2018-01-03 ENCOUNTER — Other Ambulatory Visit: Payer: Self-pay | Admitting: Cardiovascular Disease

## 2018-01-21 ENCOUNTER — Other Ambulatory Visit: Payer: Self-pay

## 2018-01-21 ENCOUNTER — Ambulatory Visit (INDEPENDENT_AMBULATORY_CARE_PROVIDER_SITE_OTHER)
Admission: RE | Admit: 2018-01-21 | Discharge: 2018-01-21 | Disposition: A | Discharge status: Home / Self Care | Payer: Medicare Other | Source: Ambulatory Visit | Attending: Vascular Surgery | Admitting: Vascular Surgery

## 2018-01-21 ENCOUNTER — Ambulatory Visit (HOSPITAL_COMMUNITY)
Admission: RE | Admit: 2018-01-21 | Discharge: 2018-01-21 | Disposition: A | Discharge status: Home / Self Care | Payer: Medicare Other | Source: Ambulatory Visit | Attending: Vascular Surgery | Admitting: Vascular Surgery

## 2018-01-21 ENCOUNTER — Encounter: Payer: Self-pay | Admitting: Vascular Surgery

## 2018-01-21 ENCOUNTER — Ambulatory Visit (INDEPENDENT_AMBULATORY_CARE_PROVIDER_SITE_OTHER): Payer: Medicare Other | Admitting: Vascular Surgery

## 2018-01-21 VITALS — BP 134/80 | HR 73 | Resp 18 | Ht 68.0 in | Wt 291.0 lb

## 2018-01-21 DIAGNOSIS — S85001D Unspecified injury of popliteal artery, right leg, subsequent encounter: Secondary | ICD-10-CM | POA: Diagnosis not present

## 2018-01-21 DIAGNOSIS — R0989 Other specified symptoms and signs involving the circulatory and respiratory systems: Secondary | ICD-10-CM | POA: Diagnosis present

## 2018-01-21 DIAGNOSIS — E785 Hyperlipidemia, unspecified: Secondary | ICD-10-CM | POA: Diagnosis not present

## 2018-01-21 DIAGNOSIS — I10 Essential (primary) hypertension: Secondary | ICD-10-CM | POA: Insufficient documentation

## 2018-01-21 DIAGNOSIS — S85001A Unspecified injury of popliteal artery, right leg, initial encounter: Secondary | ICD-10-CM

## 2018-01-21 DIAGNOSIS — X58XXXA Exposure to other specified factors, initial encounter: Secondary | ICD-10-CM | POA: Insufficient documentation

## 2018-01-21 NOTE — Progress Notes (Signed)
Patient ID: [name], female   DOB: [birth year], 72 y.o.   MRN: [id number]  Reason for Consult: Follow-up (6 month f/u )   Referred by Leonard Downing, *  Subjective:     HPI:  [name] is a 72 y.o. female presents for evaluation of her right popliteal to popliteal bypass for popliteal artery transection following low impact fall.  She has had some swelling is wearing compression stockings religiously.  She is able to walk now without the use of any device but she does use a cane for stabilization.  She continues on her blood thinners for A. fib atrial fibrillation.  She has questions regarding stopping aspirin at this time.  Her chief complaint today is left hip pain.  She has lost 5 pounds since her last visit.  Past Medical History:  Diagnosis Date  . Abnormal Pap smear    Rare ASCUS-H  . Arthritis   . Atrial fibrillation ([place])   . Chest pain   . Diabetes mellitus without complication ([place])   . Hypercholesteremia   . Hypertension    per pt no htn but needs meds for other reason  . Obesity    Family History  Problem Relation Age of Onset  . Emphysema Father   . Atrial fibrillation Mother        has pacemaker  . Diabetes Mother   . Hypertension Mother   . Other Brother        bulbar palsy   Past Surgical History:  Procedure Laterality Date  . BYPASS GRAFT POPLITEAL TO POPLITEAL Right 05/17/2017   Procedure: BYPASS GRAFT ABOVE KNEE POPLITEAL TO BELOW KNEE POPLITEAL USING NONREVERSED RIGHT GREATER SAPHENOUS VEIN;  Surgeon: Waynetta Sandy, MD;  Location: [facility];  Service: Vascular;  Laterality: Right;  . CARDIAC CATHETERIZATION  07/11/2010   Est. EF of 60-65% -- Smooth and normal coronary arteries  -- Normal LV systolic function   . CHOLECYSTECTOMY    . CYSTOCELE REPAIR  07/22/2010   Vault suspension, cystocele repair, graft and cystoscopy  . FINGER SURGERY    . NASAL SINUS SURGERY    . NEUROMA SURGERY     rt foot  . TONSILLECTOMY    . TOTAL  KNEE ARTHROPLASTY  06/20/2009   left knee  . TOTAL KNEE ARTHROPLASTY  12/27/2008   right knee  . TOTAL VAGINAL HYSTERECTOMY  07/22/2010   with bilateral salpingo-oophorectomy by Dr. Joan Flores  . VEIN HARVEST Right 05/17/2017   Procedure: RIGHT GREATER SAPHENOUS VEIN HARVEST;  Surgeon: Waynetta Sandy, MD;  Location: [facility];  Service: Vascular;  Laterality: Right;    Short Social History:  Social History   Tobacco Use  . Smoking status: Never Smoker  . Smokeless tobacco: Never Used  Substance Use Topics  . Alcohol use: No    Allergies  Allergen Reactions  . Atenolol   . Erythromycin     Nausea  . Erythromycin Base   . Plavix [Clopidogrel Bisulfate]     cough  . Sulfa Antibiotics   . Sulfonamide Derivatives     Rash    Current Outpatient Medications  Medication Sig Dispense Refill  . ACCU-CHEK AVIVA PLUS test strip   0  . Ascorbic Acid (VITAMIN C) 1000 MG tablet Take 1,000 mg by mouth daily.    Marland Kitchen aspirin EC 81 MG tablet Take 81 mg by mouth daily.    Marland Kitchen BIOTIN PO Take 1 capsule by mouth at bedtime.     Marland Kitchen  Calcium Carbonate-Vit D-Min (GNP CALCIUM PLUS 600 +D PO) Take 1 tablet by mouth every other day.     . diltiazem (CARDIZEM CD) 240 MG 24 hr capsule TAKE 1 CAPSULE BY MOUTH DAILY. 90 capsule 2  . Docusate Calcium (STOOL SOFTENER PO) Take 1 capsule by mouth at bedtime.     . docusate sodium (COLACE) 100 MG capsule Take 100 mg by mouth 2 (two) times daily.    Marland Kitchen ELIQUIS 5 MG TABS tablet TAKE 1 TABLET BY MOUTH 2 TIMES DAILY. 60 tablet 6  . Flaxseed, Linseed, (FLAX SEED OIL) 1000 MG CAPS Take 1,000 mg by mouth daily.     Marland Kitchen glucose blood test strip Accu-Chek Aviva Plus test strips    . loratadine (CLARITIN) 10 MG tablet Take 10 mg by mouth daily.    . mometasone (NASONEX) 50 MCG/ACT nasal spray Place 1 spray into the nose daily as needed for congestion.  0  . multivitamin (THERAGRAN) per tablet Take 1 tablet by mouth daily.      . Omega-3 Fatty Acids (FISH OIL PO) Take 1  capsule by mouth daily. 1200 mg daily    . PATADAY 0.2 % SOLN Place 1 drop into both eyes daily.   4  . potassium chloride (K-DUR) 10 MEQ tablet Take 1 tablet (10 mEq total) by mouth daily. Please keep 02/12/17 Appt for further refills 90 tablet 3  . pyridOXINE (VITAMIN B-6) 100 MG tablet Take 100 mg by mouth daily.     . ranitidine (ZANTAC) 150 MG tablet Take 75 mg by mouth at bedtime.     . triamterene-hydrochlorothiazide (MAXZIDE) 75-50 MG tablet Take 1 tablet by mouth daily.    . Doxycycline Hyclate (DOXY-CAPS PO) Take 100 mg by mouth daily.     Marland Kitchen oxyCODONE-acetaminophen (PERCOCET/ROXICET) 5-325 MG tablet Take 1 tablet by mouth every 6 (six) hours as needed for severe pain. (Patient not taking: Reported on 01/21/2018) 8 tablet 0  . PREDNISOLON-GATIFLOX-BROMFENAC OP Apply 1 drop to eye 4 (four) times daily.    . traMADol (ULTRAM) 50 MG tablet Take 50 mg by mouth every 6 (six) hours as needed for moderate pain.     No current facility-administered medications for this visit.     Review of Systems  Constitutional:  Constitutional negative. HENT: HENT negative.  Eyes: Eyes negative.  Cardiovascular: Positive for leg swelling.  GI: Gastrointestinal negative.  Musculoskeletal: Positive for gait problem, leg pain and joint pain.  Hematologic: Hematologic/lymphatic negative.  Psychiatric: Psychiatric negative.        Objective:  Objective   Vitals:   01/21/18 1110  BP: 134/80  Pulse: 73  Resp: 18  SpO2: 98%  Weight: 291 lb (132 kg)  Height: 5\' 8"  (1.727 m)   Body mass index is 44.25 kg/m.  Physical Exam  Constitutional: She appears well-developed.  HENT:  Head: Normocephalic.  Eyes: Pupils are equal, round, and reactive to light.  Neck: Normal range of motion.  Cardiovascular: An irregular rhythm present.  Pulses:      Dorsalis pedis pulses are 2+ on the right side.  Abdominal: Soft. She exhibits no mass.  Musculoskeletal: She exhibits edema.  Skin: Skin is warm and dry.    Lower extremity changes bilaterally consistent with C4 venous disease  Psychiatric: She has a normal mood and affect. Her behavior is normal. Judgment and thought content normal.    Data: I have independently interpreted her lower extremity duplex which demonstrates triphasic waveform throughout the bypass.  Her ABIs  are 1.2 bilaterally.     Assessment/Plan:     72 year old female follows up from popliteal above the knee to below the knee bypass with ipsilateral greater saphenous vein performed last December after a fall from standing on ice resulting in a knee dislocation.  She is doing great at this time has a palpable pedal pulse.  She desires to stop aspirin and we have settled on taking it a few days a week given that she also takes a blood thinner and has significant bruising.  At this time we can have her follow-up in 1 year with repeat duplex and ABIs.  If she has issues before this I will certainly see her sooner.  I congratulated her on her hard work getting back on her feet.  This was certainly a devastating injury just 9 months ago she is now appears to be doing quite well.     Waynetta Sandy MD [facility]

## 2018-02-23 DIAGNOSIS — M79671 Pain in right foot: Secondary | ICD-10-CM | POA: Insufficient documentation

## 2018-02-28 ENCOUNTER — Other Ambulatory Visit: Payer: Medicare Other | Admitting: *Deleted

## 2018-02-28 DIAGNOSIS — I480 Paroxysmal atrial fibrillation: Secondary | ICD-10-CM

## 2018-02-28 DIAGNOSIS — E782 Mixed hyperlipidemia: Secondary | ICD-10-CM

## 2018-02-28 LAB — BASIC METABOLIC PANEL
BUN/Creatinine Ratio: 20 (ref 12–28)
BUN: 18 mg/dL (ref 8–27)
CALCIUM: 9.5 mg/dL (ref 8.7–10.3)
CO2: 28 mmol/L (ref 20–29)
Chloride: 100 mmol/L (ref 96–106)
Creatinine, Ser: 0.89 mg/dL (ref 0.57–1.00)
GFR, EST AFRICAN AMERICAN: 75 mL/min/{1.73_m2} (ref >59)
GFR, EST NON AFRICAN AMERICAN: 65 mL/min/{1.73_m2} (ref >59)
Glucose: 111 mg/dL — ABNORMAL HIGH (ref 65–99)
Potassium: 3.6 mmol/L (ref 3.5–5.2)
Sodium: 141 mmol/L (ref 134–144)

## 2018-02-28 LAB — HEPATIC FUNCTION PANEL
ALBUMIN: 3.8 g/dL (ref 3.5–4.8)
ALT: 23 IU/L (ref 0–32)
AST: 15 IU/L (ref 0–40)
Alkaline Phosphatase: 75 IU/L (ref 39–117)
BILIRUBIN TOTAL: 0.9 mg/dL (ref 0.0–1.2)
Bilirubin, Direct: 0.28 mg/dL (ref 0.00–0.40)
TOTAL PROTEIN: 5.6 g/dL — AB (ref 6.0–8.5)

## 2018-02-28 LAB — LIPID PANEL
CHOL/HDL RATIO: 2.3 ratio (ref 0.0–4.4)
Cholesterol, Total: 145 mg/dL (ref 100–199)
HDL: 64 mg/dL (ref >39)
LDL Calculated: 70 mg/dL (ref 0–99)
Triglycerides: 53 mg/dL (ref 0–149)
VLDL Cholesterol Cal: 11 mg/dL (ref 5–40)

## 2018-03-02 ENCOUNTER — Encounter: Payer: Self-pay | Admitting: Cardiovascular Disease

## 2018-03-02 ENCOUNTER — Ambulatory Visit: Payer: Medicare Other | Admitting: Cardiovascular Disease

## 2018-03-02 VITALS — BP 110/80 | HR 65 | Ht 68.0 in | Wt 285.0 lb

## 2018-03-02 DIAGNOSIS — I10 Essential (primary) hypertension: Secondary | ICD-10-CM

## 2018-03-02 DIAGNOSIS — E782 Mixed hyperlipidemia: Secondary | ICD-10-CM | POA: Diagnosis not present

## 2018-03-02 DIAGNOSIS — I480 Paroxysmal atrial fibrillation: Secondary | ICD-10-CM | POA: Diagnosis not present

## 2018-03-02 NOTE — Progress Notes (Signed)
Cardiology Office Note   Date:  03/02/2018   ID:  [name], DOB 22-Jun-1945, MRN [id number]  PCP:  Leonard Downing, MD  Cardiologist:   Mertie Moores, MD   Chief Complaint  Patient presents with  . Hypertension  . Hyperlipidemia       [name] is a 72 y.o. female who presents for follow up of her paroxysmal atrial fib  1. Hypertension 2. History of atrial fibrillation 3. Hyperlipidemia 4.  Knee replacement 5.  melanoma       [name] is doing well. She has decreased her dose of Maxzide and her BP has been elevated. She has not been exercising on a regular basis but has been trying to exercise more since New's Years Day. She has lost 6 pounds. She has not had any chest pain or dyspnea.   She did feel a little bit of extra salt recently.   January 02, 2013:  [name] has done well. She had some various aches and pains (chest , back) in Feb. The pains improved with omeprazol but it caused excessive gas and bloating. Her lipids have been minimally elevated.   07/05/2013:  [name] is doing well. She has a sinus infection recently.   January 01, 2014:  [name] is doing well. She has had some sinus infections.   Jan. 25, 2016:  [name] is doing well. No cardiac issues.  Has had a few palpitations - heart racing.  But her HR would be normal if she takes her pulse.  Rides her stationary bike 30 mninutes a day.   Jan. 24, 2017:  [name] is seen for follow up of her atrial fib.  CHADS2VASC = 3 ( female, HTN, AGE > 31)  She has PAF - was in NSR at her last visit. Is back in A-Fib today .  Asymptomatic.  No CP or dyspnea  Has occasional right sided chest pain .   Has right shoulder pain and is seeing Dr. Onnie Graham soon Had a Myoivew in 2012 - ( inf. Defect) .  Subsequent cath showed normal coronary arteries.   December 19, 2015: [name] is back in NSR today  Has PAF , has never needed cardioversion. Takes propranolol as needed. Has some dizziness /  orthostasis  Some of her dizziness is not related to orthostasis   Jan. 29, 2018:  [name] is seen today .    Has been having problems with her hemmorhoids.   Asked about watchman  I have suggested that she get her hemorrhoids fixed and we resume anticoagulation shortly thereafter.  She has occasional episodes of brief presyncope. Typically occur if she sitting down. Has never had complete syncope   Sept. 7 , 2018:  Has had several episodes of lightneadedness. Typically when she just stands up.   Also can have lightheadedness when she turns her head Has not had true syncope Has run out of potassium 2 weeks ago   September 08, 2017:  [name] is seen today  Dislocated her Right knee,   Required vascular surgery ( popliteal artery rupture) Was in rehab for a month Developed MSRS Was then found to have melanoma  Bilateral cataract removal.   Needs to have left hip replacement but they will not do it until she has lost about 25 lbs.   March 02, 2018:  [name] is seen today has been having issues with abdominal cramping. Had chills and sweats Sunday night.     Wt. Is 285. 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 Abnormal Pap smear    Rare ASCUS-H  . Arthritis   . Atrial fibrillation ([place])   . Chest pain   . Diabetes mellitus without complication ([place])   . HTN (hypertension) 07/02/2015  . Hypercholesteremia   . Hyperglycemia 02/04/2011  . Hyperlipidemia 02/04/2011  . Hypertension    per pt no htn but needs meds for other reason  . Hypokalemia 05/17/2017  . Injury of right popliteal artery 05/17/2017  . Knee dislocation 05/17/2017  . Leg edema 02/04/2011  . Leg swelling 02/02/2011  . Leukocytosis 05/17/2017  . Obesity   . PAF (paroxysmal atrial fibrillation) ([place]) 02/02/2011  . Popliteal artery injury 05/17/2017    Past Surgical History:    Procedure Laterality Date  . BYPASS GRAFT POPLITEAL TO POPLITEAL Right 05/17/2017   Procedure: BYPASS GRAFT ABOVE KNEE POPLITEAL TO BELOW KNEE POPLITEAL USING NONREVERSED RIGHT GREATER SAPHENOUS VEIN;  Surgeon: Waynetta Sandy, MD;  Location: [facility];  Service: Vascular;  Laterality: Right;  . CARDIAC CATHETERIZATION  07/11/2010   Est. EF of 60-65% -- Smooth and normal coronary arteries  -- Normal LV systolic function   . CHOLECYSTECTOMY    . CYSTOCELE REPAIR  07/22/2010   Vault suspension, cystocele repair, graft and cystoscopy  . FINGER SURGERY    . NASAL SINUS SURGERY    . NEUROMA SURGERY     rt foot  . TONSILLECTOMY    . TOTAL KNEE ARTHROPLASTY  06/20/2009   left knee  . TOTAL KNEE ARTHROPLASTY  12/27/2008   right knee  . TOTAL VAGINAL HYSTERECTOMY  07/22/2010   with bilateral salpingo-oophorectomy by Dr. Joan Flores  . VEIN HARVEST Right 05/17/2017   Procedure: RIGHT GREATER SAPHENOUS VEIN HARVEST;  Surgeon: Waynetta Sandy, MD;  Location: [facility];  Service: Vascular;  Laterality: Right;     Current Outpatient Medications  Medication Sig Dispense Refill  . ACCU-CHEK AVIVA PLUS test strip   0  . Ascorbic Acid (VITAMIN C) 1000 MG tablet Take 1,000 mg by mouth daily.    Marland Kitchen aspirin EC 81 MG tablet Take 81 mg by mouth every other day.     Marland Kitchen BIOTIN PO Take 1 capsule by mouth at bedtime.     . Calcium Carbonate-Vit D-Min (GNP CALCIUM PLUS 600 +D PO) Take 1 tablet by mouth every other day.     . diltiazem (CARDIZEM CD) 240 MG 24 hr capsule TAKE 1 CAPSULE BY MOUTH DAILY. 90 capsule 2  . Docusate Calcium (STOOL SOFTENER PO) Take 1 capsule by mouth at bedtime.     Marland Kitchen ELIQUIS 5 MG TABS tablet TAKE 1 TABLET BY MOUTH 2 TIMES DAILY. 60 tablet 6  . Flaxseed, Linseed, (FLAX SEED OIL) 1000 MG CAPS Take 1,000 mg by mouth daily.     Marland Kitchen glucose blood test strip Accu-Chek Aviva Plus test strips    . loratadine (CLARITIN) 10 MG tablet Take 10 mg by mouth daily.    . mometasone (NASONEX)  50 MCG/ACT nasal spray Place 1 spray into the nose daily as needed for congestion.  0  . multivitamin (THERAGRAN) per tablet Take 1 tablet by mouth daily.      . Omega-3 Fatty Acids (FISH OIL PO) Take 1 capsule by mouth daily. 1200 mg daily    . PATADAY 0.2 % SOLN Place 1  drop into both eyes daily.   4  . potassium chloride (K-DUR) 10 MEQ tablet Take 1 tablet (10 mEq total) by mouth daily. Please keep 02/12/17 Appt for further refills 90 tablet 3  . pyridOXINE (VITAMIN B-6) 100 MG tablet Take 100 mg by mouth daily.     . ranitidine (ZANTAC) 150 MG tablet Take 75 mg by mouth at bedtime.     . triamterene-hydrochlorothiazide (MAXZIDE) 75-50 MG tablet Take 1 tablet by mouth daily.     No current facility-administered medications for this visit.     Allergies:   Atenolol; Erythromycin; Erythromycin base; Plavix [clopidogrel bisulfate]; Sulfa antibiotics; and Sulfonamide derivatives    Social History:  The patient  reports that she has never smoked. She has never used smokeless tobacco. She reports that she does not drink alcohol or use drugs.   Family History:  The patient's family history includes Atrial fibrillation in her mother; Diabetes in her mother; Emphysema in her father; Hypertension in her mother; Other in her brother.    ROS:  Please see the history of present illness.   Otherwise, review of systems are positive for none.   All other systems are reviewed and negative.    Physical Exam: Blood pressure 110/80, pulse 65, height 5\' 8"  (1.727 m), weight 285 lb (129.3 kg), last menstrual period 07/22/2010, SpO2 98 %.  GEN:  Well nourished, well developed in no acute distress HEENT: Normal NECK: No JVD; No carotid bruits LYMPHATICS: No lymphadenopathy CARDIAC: RRR , no murmurs, rubs, gallops RESPIRATORY:  Clear to auscultation without rales, wheezing or rhonchi  ABDOMEN: Soft, non-tender, non-distended MUSCULOSKELETAL:  No edema; No deformity  SKIN: Warm and dry NEUROLOGIC:  Alert  and oriented x 3   EKG:     March 02, 2018: Normal sinus rhythm at 65.  No ST or T wave changes.   Recent Labs: 05/17/2017: Magnesium 1.9 05/22/2017: Hemoglobin 10.7; Platelets 273 09/10/2017: TSH 2.960 02/28/2018: ALT 23; BUN 18; Creatinine, Ser 0.89; Potassium 3.6; Sodium 141    Lipid Panel    Component Value Date/Time   CHOL 145 02/28/2018 0954   TRIG 53 02/28/2018 0954   HDL 64 02/28/2018 0954   CHOLHDL 2.3 02/28/2018 0954   CHOLHDL 3.4 07/02/2015 0737   VLDL 16 07/02/2015 0737   LDLCALC 70 02/28/2018 0954      Wt Readings from Last 3 Encounters:  03/02/18 285 lb (129.3 kg)  01/21/18 291 lb (132 kg)  09/08/17 293 lb 12 oz (133.2 kg)      Other studies Reviewed: Additional studies/ records that were reviewed today include: . Review of the above records demonstrates:    ASSESSMENT AND PLAN:  1.  Paroxysmal atrial fib:     Remains in NSR .   2. Hypertension:   -BP is well controlled.   3. Hyperlipidemia:   Well controlled   4. Obesity:    Encouraged continued weight loss.  As lost 8 lbs since last visit    Current medicines are reviewed at length with the patient today.  The patient does not have concerns regarding medicines.  The following changes have been made:  no change  Labs/ tests ordered today include:   Orders Placed This Encounter  Procedures  . EKG 12-Lead   Disposition:   FU with me in 6 months   Signed, Mertie Moores, MD  03/02/2018 6:02 PM    [facility] [place] [place], [place], [place]  [address] Phone: [phone]; Fax: [phone]  

## 2018-03-02 NOTE — Patient Instructions (Signed)
Medication Instructions:  Your physician recommends that you continue on your current medications as directed. Please refer to the Current Medication list given to you today.   Labwork: None Ordered   Testing/Procedures: None Ordered   Follow-Up: Your physician wants you to follow-up in: 6 months with Dr. Nahser.  You will receive a reminder letter in the mail two months in advance. If you don't receive a letter, please call our office to schedule the follow-up appointment.   If you need a refill on your cardiac medications before your next appointment, please call your pharmacy.   Thank you for choosing [facility]! Zaiyah Sottile, RN [phone]    

## 2018-03-10 ENCOUNTER — Other Ambulatory Visit: Payer: Self-pay | Admitting: Cardiovascular Disease

## 2018-05-19 ENCOUNTER — Other Ambulatory Visit: Payer: Self-pay | Admitting: Cardiovascular Disease

## 2018-05-19 NOTE — Telephone Encounter (Signed)
Eliquis 5mg  refill request received; pt is 72 yrs old, wt-129.3kg, Crea-0.89 on 02/28/18, last seen by Dr. Acie Fredrickson on 03/02/18; will send in refill to requested pharmacy,

## 2018-05-25 ENCOUNTER — Ambulatory Visit: Payer: Medicare Other | Admitting: Certified Nurse Midwife

## 2018-06-13 ENCOUNTER — Other Ambulatory Visit: Payer: Self-pay | Admitting: Certified Nurse Midwife

## 2018-06-13 DIAGNOSIS — Z1231 Encounter for screening mammogram for malignant neoplasm of breast: Secondary | ICD-10-CM

## 2018-06-15 ENCOUNTER — Telehealth: Payer: Self-pay | Admitting: *Deleted

## 2018-06-15 DIAGNOSIS — I480 Paroxysmal atrial fibrillation: Secondary | ICD-10-CM

## 2018-06-15 DIAGNOSIS — R011 Cardiac murmur, unspecified: Secondary | ICD-10-CM

## 2018-06-15 NOTE — Telephone Encounter (Signed)
   [facility] Pre-operative Risk Assessment    Request for surgical clearance:  1. What type of surgery is being performed? COLONOSCOPY/ENDOSCOPY   2. When is this surgery scheduled? 07/25/18   3. What type of clearance is required (medical clearance vs. Pharmacy clearance to hold med vs. Both)? BOTH  4. Are there any medications that need to be held prior to surgery and how long?ELIQUIS, ASA   5. Practice name and name of physician performing surgery? [facility]; DR. Alessandra Bevels  6. What is your office phone number  [phone]   7.   What is your office fax number [phone]  8.   Anesthesia type (None, local, MAC, general) ? PROPOFOL    [name] 06/15/2018, 11:30 AM  _________________________________________________________________   (provider comments below)

## 2018-06-17 NOTE — Telephone Encounter (Signed)
She may DC ASA from my standpoint

## 2018-06-17 NOTE — Telephone Encounter (Signed)
   Primary Cardiologist: Mertie Moores, MD  Chart reviewed as part of pre-operative protocol coverage. Patient was contacted 06/17/2018 in reference to pre-operative risk assessment for pending surgery as outlined below.  [name] was last seen 02/2018 by Dr. Acie Fredrickson - h/o HTN, HLD, afib, DM, obesity.  We are asked if patient can hold ASA and Eliquis.  - I do not actually see a specific indication for ASA so will reach out to Dr. Acie Fredrickson to confirm whether she should be on this.  - Will route to pharmacy for Eliquis input.  Pt will need call with update of the above once finalized.   Charlie Pitter, PA-C 06/17/2018, 11:47 AM

## 2018-06-20 NOTE — Telephone Encounter (Signed)
Scheduled patient for echocardiogram 07/06/2018. She was grateful for call and agrees with treatment plan.

## 2018-06-20 NOTE — Telephone Encounter (Signed)
Pt takes Eliquis for afib with CHADS2VASc score of 4 (age, sex, HTN, DM). Renal function is normal. Ok to hold Eliquis for 2 days prior to procedure.

## 2018-06-20 NOTE — Telephone Encounter (Signed)
Hi Phil and Sharyn Lull, patient was contacted today for preop clearance, affirms she's feeling well. I have cleared with antiplatelet/anticoag instructions before. Sometimes when patients get [name] on the phone they report other issues. She states she had URI recently and NP told her she had a heart murmur. She asked me to look through her chart to investigate. I do not see this documented in Phil's prior notes and no prior echo on file. She states Dr. Alessandra Bevels said he did not hear one but that he does not specialize in hearts (GI). She wanted to know if anything else needs to be done for this - has f/u 08/2018, so if you want echo before then, please let your nurse know. Otherwise I'm removing this from the preop box. I also learned shes on the ASA from vascular team so we will defer to them about holding for colonoscopy. Brookelyn Gaynor PA-C

## 2018-06-20 NOTE — Telephone Encounter (Addendum)
   Primary Cardiologist: Mertie Moores, MD  Chart reviewed as part of pre-operative protocol coverage. Patient was contacted 06/20/2018 in reference to pre-operative risk assessment for pending surgery as outlined below.  [name] was last seen 02/2018 by Dr. Acie Fredrickson - h/o HTN, HLD, afib, PAD, DM, obesity, normal cors 2012. RCRI 0.4% indicating low risk of CV complications. I spoke with patient who affirms no new cardiac symptoms. No CP, SOB, syncope, palpitations. Therefore, based on ACC/AHA guidelines, the patient would be at acceptable risk for the planned procedure without further cardiovascular testing. She does mention she was told a few weeks ago she had a heart murmur so I will include this in separate note to Dr. Acie Fredrickson but I do not specifically think this needs to be evaluated prior to colonoscopy.  I reviewed the patient's chart with Dr. Acie Fredrickson regarding the aspirin. She is not on this for cardiac reasons. The patient reports she was placed on this by vascular surgery and states she was instructed she should always continue unless absolutely necessary to hold it. Therefore since this is not managed through our office I would recommend the requesting GI team reach out to vascular surgery for their input on ASA.  Per pharmD, "Pt takes Eliquis for afib with CHADS2VASc score of 4 (age, sex, HTN, DM). Renal function is normal. Ok to hold Eliquis for 2 days prior to procedure."  Will route this bundled recommendation to requesting provider via Epic fax function. Please call with questions.  Robin Pitter, PA-C 06/20/2018, 2:13 PM

## 2018-06-20 NOTE — Telephone Encounter (Signed)
Her echos precede the start of Epic and are in her old paper chart.  Would be OK to order an echo Dx Murmur,  Atrial fib

## 2018-06-28 ENCOUNTER — Encounter: Payer: Self-pay | Admitting: Certified Nurse Midwife

## 2018-06-28 ENCOUNTER — Other Ambulatory Visit: Payer: Self-pay

## 2018-06-28 ENCOUNTER — Ambulatory Visit (INDEPENDENT_AMBULATORY_CARE_PROVIDER_SITE_OTHER): Payer: Medicare Other | Admitting: Certified Nurse Midwife

## 2018-06-28 VITALS — BP 120/78 | HR 68 | Resp 16 | Ht 67.75 in | Wt 286.0 lb

## 2018-06-28 DIAGNOSIS — Z780 Asymptomatic menopausal state: Secondary | ICD-10-CM | POA: Diagnosis not present

## 2018-06-28 DIAGNOSIS — Z01419 Encounter for gynecological examination (general) (routine) without abnormal findings: Secondary | ICD-10-CM | POA: Diagnosis not present

## 2018-06-28 NOTE — Progress Notes (Signed)
73 y.o. G30P2002 Married  Caucasian Fe here for annual exam. Post menopausal , no vaginal dryness or vaginal bleeding. Dealing with many issues with health. PCP  Management, for glucose, cholesterol, hypertension. Has visit with cardiology soon for heart murmur evaluation. Still working with on weight loss( down 13 pounds) so she can have hip replacement. No Gyn issues today.   Patient's last menstrual period was 07/22/2010.          Sexually active: Yes.    The current method of family planning is status post hysterectomy.    Exercising: Yes.    housework, yardwork, stationary bike Smoker:  no  Review of Systems  Constitutional: Negative.   HENT: Negative.   Eyes: Negative.   Respiratory: Negative.   Cardiovascular: Negative.   Gastrointestinal: Negative.   Genitourinary: Negative.   Musculoskeletal: Negative.   Skin: Negative.   Neurological: Negative.   Endo/Heme/Allergies: Negative.   Psychiatric/Behavioral: Negative.     Health Maintenance: Pap:  01-02-10 rare ASCUS H colpo done 2011 neg History of Abnormal Pap: yes MMG:  08-05-17 category b density birads 1:neg Self Breast exams: occ Colonoscopy:  2015 f/u 17yrs BMD:   2013 declines scheduling due to other orthopedic issues TDaP:  2018 Shingles: 2019 Pneumonia: 2017 Hep C and HIV:hep c neg per patient Labs: PCP and Dr. Mollie Germany   reports that she has never smoked. She has never used smokeless tobacco. She reports that she does not drink alcohol or use drugs.  Past Medical History:  Diagnosis Date  . Abnormal Pap smear    Rare ASCUS-H  . Arthritis   . Atrial fibrillation ([place])   . Chest pain   . Diabetes mellitus without complication ([place])   . HTN (hypertension) 07/02/2015  . Hypercholesteremia   . Hyperglycemia 02/04/2011  . Hyperlipidemia 02/04/2011  . Hypertension    per pt no htn but needs meds for other reason  . Hypokalemia 05/17/2017  . Injury of right popliteal artery 05/17/2017  . Knee dislocation 05/17/2017   . Leg edema 02/04/2011  . Leg swelling 02/02/2011  . Leukocytosis 05/17/2017  . Obesity   . PAF (paroxysmal atrial fibrillation) ([place]) 02/02/2011  . Popliteal artery injury 05/17/2017    Past Surgical History:  Procedure Laterality Date  . BYPASS GRAFT POPLITEAL TO POPLITEAL Right 05/17/2017   Procedure: BYPASS GRAFT ABOVE KNEE POPLITEAL TO BELOW KNEE POPLITEAL USING NONREVERSED RIGHT GREATER SAPHENOUS VEIN;  Surgeon: Waynetta Sandy, MD;  Location: [place];  Service: Vascular;  Laterality: Right;  . CARDIAC CATHETERIZATION  07/11/2010   Est. EF of 60-65% -- Smooth and normal coronary arteries  -- Normal LV systolic function   . CHOLECYSTECTOMY    . CYSTOCELE REPAIR  07/22/2010   Vault suspension, cystocele repair, graft and cystoscopy  . FINGER SURGERY    . NASAL SINUS SURGERY    . NEUROMA SURGERY     rt foot  . TONSILLECTOMY    . TOTAL KNEE ARTHROPLASTY  06/20/2009   left knee  . TOTAL KNEE ARTHROPLASTY  12/27/2008   right knee  . TOTAL VAGINAL HYSTERECTOMY  07/22/2010   with bilateral salpingo-oophorectomy by Dr. Joan Flores  . VEIN HARVEST Right 05/17/2017   Procedure: RIGHT GREATER SAPHENOUS VEIN HARVEST;  Surgeon: Waynetta Sandy, MD;  Location: [facility];  Service: Vascular;  Laterality: Right;    Current Outpatient Medications  Medication Sig Dispense Refill  . ACCU-CHEK AVIVA PLUS test strip   0  . Ascorbic Acid (VITAMIN C) 1000 MG tablet  Take 1,000 mg by mouth daily.    Marland Kitchen aspirin EC 81 MG tablet Take 81 mg by mouth every other day.     Marland Kitchen BIOTIN PO Take 1 capsule by mouth at bedtime.     . Calcium Carbonate-Vit D-Min (GNP CALCIUM PLUS 600 +D PO) Take 1 tablet by mouth every other day.     . diltiazem (CARDIZEM CD) 240 MG 24 hr capsule TAKE 1 CAPSULE BY MOUTH DAILY. 90 capsule 2  . Docusate Calcium (STOOL SOFTENER PO) Take 1 capsule by mouth at bedtime.     Marland Kitchen ELIQUIS 5 MG TABS tablet TAKE 1 TABLET BY MOUTH 2 TIMES DAILY. 60 tablet 9  . Flaxseed, Linseed,  (FLAX SEED OIL) 1000 MG CAPS Take 1,000 mg by mouth daily.     Marland Kitchen glucose blood test strip Accu-Chek Aviva Plus test strips    . loratadine (CLARITIN) 10 MG tablet Take 10 mg by mouth daily.    . mometasone (NASONEX) 50 MCG/ACT nasal spray Place 1 spray into the nose daily as needed for congestion.  0  . multivitamin (THERAGRAN) per tablet Take 1 tablet by mouth daily.      . Omega-3 Fatty Acids (FISH OIL PO) Take 1 capsule by mouth daily. 1200 mg daily    . PATADAY 0.2 % SOLN Place 1 drop into both eyes daily.   4  . potassium chloride (K-DUR) 10 MEQ tablet TAKE 1 TABLET BY MOUTH DAILY. 90 tablet 3  . pyridOXINE (VITAMIN B-6) 100 MG tablet Take 100 mg by mouth daily.     . ranitidine (ZANTAC) 150 MG tablet Take 75 mg by mouth at bedtime.     . triamterene-hydrochlorothiazide (MAXZIDE) 75-50 MG tablet Take 1 tablet by mouth daily.     No current facility-administered medications for this visit.     Family History  Problem Relation Age of Onset  . Emphysema Father   . Atrial fibrillation Mother        has pacemaker  . Diabetes Mother   . Hypertension Mother   . Other Brother        bulbar palsy    ROS:  Pertinent items are noted in HPI.  Otherwise, a comprehensive ROS was negative.  Exam:   LMP 07/22/2010    Ht Readings from Last 3 Encounters:  03/02/18 5\' 8"  (1.727 m)  01/21/18 5\' 8"  (1.727 m)  09/08/17 5\' 8"  (1.727 m)    General appearance: alert, cooperative and appears stated age Head: Normocephalic, without obvious abnormality, atraumatic Neck: no adenopathy, supple, symmetrical, trachea midline and thyroid normal to inspection and palpation Lungs: clear to auscultation bilaterally Breasts: normal appearance, no masses or tenderness, No nipple retraction or dimpling, No nipple discharge or bleeding, No axillary or supraclavicular adenopathy Heart: regular rate and rhythm Abdomen: soft, non-tender; no masses,  no organomegaly Extremities: extremities normal, atraumatic,  no cyanosis or edema Skin: Skin color, texture, turgor normal. No rashes or lesions Lymph nodes: Cervical, supraclavicular, and axillary nodes normal. No abnormal inguinal nodes palpated Neurologic: Grossly normal   Pelvic: External genitalia:  no lesions              Urethra:  normal appearing urethra with no masses, tenderness or lesions              Bartholin's and Skene's: normal                 Vagina: normal appearing vagina with normal color and discharge, no lesions  Cervix: absent              Pap taken: No. Bimanual Exam:  Uterus:  uterus absent              Adnexa: no mass, fullness, tenderness and adnexa surgically absent               Rectovaginal: Confirms               Anus:  normal sphincter tone, no lesions  Chaperone present: yes  A:  Well Woman with normal exam  Post menopausal s/P TVH with BSO  Obese working on weight loss  Heart murmur to be evaluated with cardiology, has Atrial Fibrillation.  Medical issues with PCP and cardiology  P:   Reviewed health and wellness pertinent to exam  Aware if vaginal dryness or issues to advise.  Encouraged to continue her weight loss journey  Continue follow up with MD as indicated  Pap smear: no   counseled on breast self exam, mammography screening, feminine hygiene, adequate intake of calcium and vitamin D, diet and exercise, Kegel's exercises  return annually or prn  An After Visit Summary was printed and given to the patient.

## 2018-06-28 NOTE — Patient Instructions (Signed)

## 2018-07-06 ENCOUNTER — Ambulatory Visit (HOSPITAL_COMMUNITY): Payer: Medicare Other | Attending: Cardiology

## 2018-07-06 DIAGNOSIS — R011 Cardiac murmur, unspecified: Secondary | ICD-10-CM | POA: Insufficient documentation

## 2018-07-06 DIAGNOSIS — I480 Paroxysmal atrial fibrillation: Secondary | ICD-10-CM | POA: Diagnosis present

## 2018-07-08 ENCOUNTER — Telehealth: Payer: Self-pay | Admitting: Cardiovascular Disease

## 2018-07-08 NOTE — Telephone Encounter (Signed)
New message ° ° °Patient is returning call for echo results.  °

## 2018-07-08 NOTE — Telephone Encounter (Signed)
Spoke with the patient, she expressed understanding about her echo results. She had no further questions.

## 2018-07-08 NOTE — Telephone Encounter (Signed)
Notes recorded by Thayer Headings, MD on 07/07/2018 at 4:08 PM EST Normal left ventricular systolic function. We were not able to assess the diastolic function because of her atrial fibrillation. All the heart looks stable.

## 2018-08-02 ENCOUNTER — Telehealth: Payer: Self-pay | Admitting: Certified Nurse Midwife

## 2018-08-02 DIAGNOSIS — Z780 Asymptomatic menopausal state: Secondary | ICD-10-CM

## 2018-08-02 NOTE — Telephone Encounter (Signed)
Last BMD 2013 at [facility] 06/28/18 Order placed for BMD at [facility]  Call to patient, advised order placed for BMD at [facility]. Patient will call TBC directly to schedule. Is aware to return call to office if any additional questions or assistance needed. Patient verbalizes understanding.   Routing to provider for final review. Patient is agreeable to disposition. Will close encounter.

## 2018-08-02 NOTE — Telephone Encounter (Signed)
Patient would like an order put in for BMD. Is having a mammogram done 08/30/2018 at 10:00.

## 2018-08-10 ENCOUNTER — Ambulatory Visit
Admission: RE | Admit: 2018-08-10 | Discharge: 2018-08-10 | Disposition: A | Discharge status: Home / Self Care | Payer: Medicare Other | Source: Ambulatory Visit | Attending: Certified Nurse Midwife | Admitting: Certified Nurse Midwife

## 2018-08-10 DIAGNOSIS — Z1231 Encounter for screening mammogram for malignant neoplasm of breast: Secondary | ICD-10-CM

## 2018-08-24 ENCOUNTER — Telehealth: Payer: Self-pay

## 2018-08-24 NOTE — Telephone Encounter (Signed)
Left message for patient to call back regarding appointment with Dr. Acie Fredrickson on 3/20. Call placed due to restrictions enacted for Covid 19.

## 2018-08-29 ENCOUNTER — Ambulatory Visit: Payer: Medicare Other | Admitting: Cardiovascular Disease

## 2018-08-29 ENCOUNTER — Telehealth (INDEPENDENT_AMBULATORY_CARE_PROVIDER_SITE_OTHER): Payer: Medicare Other | Admitting: Cardiovascular Disease

## 2018-08-29 ENCOUNTER — Encounter: Payer: Self-pay | Admitting: Cardiovascular Disease

## 2018-08-29 ENCOUNTER — Telehealth: Payer: Self-pay | Admitting: Cardiovascular Disease

## 2018-08-29 VITALS — BP 128/77 | HR 59 | Ht 68.0 in | Wt 279.0 lb

## 2018-08-29 DIAGNOSIS — I10 Essential (primary) hypertension: Secondary | ICD-10-CM

## 2018-08-29 DIAGNOSIS — I480 Paroxysmal atrial fibrillation: Secondary | ICD-10-CM | POA: Diagnosis not present

## 2018-08-29 NOTE — Telephone Encounter (Signed)
Follow Up;:    Pt said she talked to Dr Acie Fredrickson on yesterday and he said he would call back this morning. She said she was supposed to give him some information. She said she wanted to talk to you, so she can give you the information.

## 2018-08-29 NOTE — Telephone Encounter (Signed)
Dr. Acie Fredrickson spoke with patient by telephone on 3/22 and patient denies complaints or concerns. She is aware our office will contact her to reschedule her appointment.

## 2018-08-29 NOTE — Telephone Encounter (Signed)
Patient had a tele visit with Dr. Acie Fredrickson today, see visit encounter

## 2018-08-29 NOTE — Progress Notes (Signed)
Telephone Visit     Evaluation Performed:  Follow-up visit  This visit type was conducted due to national recommendations for restrictions regarding the COVID-19 Pandemic (e.g. social distancing).  This format is felt to be most appropriate for this patient at this time.  All issues noted in this document were discussed and addressed.  No physical exam was performed (except for noted visual exam findings with Telehealth visits).  See MyChart message from today for the patient's consent to telehealth for [facility].  Date:  08/29/2018   ID:  [name], DOB [birth year], MRN [id number]  Patient Location:  [address] [place] [address]   Provider location:   [facility], [place], [place]   PCP:  Leonard Downing, MD  Cardiologist:  Mertie Moores, MD  Electrophysiologist:  None   Chief Complaint:  Follow up PAF, HTN  History of Present Illness:    [name] is a 73 y.o. female who presents via audio/video conferencing for a telehealth visit today.   She has some bruising . No CP or dyspnea  She had an echo prior to a colonoscopy Echo showed normal LV function  Getting some exercise, work ing in the yard.  No fever, no cough,  No dyspnea No hematura,  + bruising   No leg swelling ,    The patient does not symptoms concerning for COVID-19 infection (fever, chills, cough, or new SHORTNESS OF BREATH).    Prior CV studies:   The following studies were reviewed today:    Past Medical History:  Diagnosis Date  . Abnormal Pap smear    Rare ASCUS-H  . Arthritis   . Atrial fibrillation ([place])   . Cancer (HCC)    melanoma rt leg  . Chest pain   . Diabetes mellitus without complication ([place])   . HTN (hypertension) 07/02/2015  . Hypercholesteremia   . Hyperglycemia 02/04/2011  . Hyperlipidemia 02/04/2011  . Hypertension    per pt no htn but needs meds for other reason  . Hypokalemia 05/17/2017  . Injury of right popliteal artery  05/17/2017  . Knee dislocation 05/17/2017  . Leg edema 02/04/2011  . Leg swelling 02/02/2011  . Leukocytosis 05/17/2017  . Obesity   . PAF (paroxysmal atrial fibrillation) ([place]) 02/02/2011  . Popliteal artery injury 05/17/2017   Past Surgical History:  Procedure Laterality Date  . BYPASS GRAFT POPLITEAL TO POPLITEAL Right 05/17/2017   Procedure: BYPASS GRAFT ABOVE KNEE POPLITEAL TO BELOW KNEE POPLITEAL USING NONREVERSED RIGHT GREATER SAPHENOUS VEIN;  Surgeon: Waynetta Sandy, MD;  Location: [place];  Service: Vascular;  Laterality: Right;  . CARDIAC CATHETERIZATION  07/11/2010   Est. EF of 60-65% -- Smooth and normal coronary arteries  -- Normal LV systolic function   . CATARACT EXTRACTION, BILATERAL     08-12-17, 08-26-17  . CHOLECYSTECTOMY    . CYSTOCELE REPAIR  07/22/2010   Vault suspension, cystocele repair, graft and cystoscopy  . FINGER SURGERY    . MELANOMA EXCISION     right leg 3/12  . NASAL SINUS SURGERY    . NEUROMA SURGERY     rt foot  . TONSILLECTOMY    . TOTAL KNEE ARTHROPLASTY  06/20/2009   left knee  . TOTAL KNEE ARTHROPLASTY  12/27/2008   right knee  . TOTAL VAGINAL HYSTERECTOMY  07/22/2010   with bilateral salpingo-oophorectomy by Dr. Joan Flores  . VEIN HARVEST Right 05/17/2017   Procedure: RIGHT GREATER SAPHENOUS VEIN HARVEST;  Surgeon: Waynetta Sandy,  MD;  Location: [facility];  Service: Vascular;  Laterality: Right;     Current Meds  Medication Sig  . ACCU-CHEK AVIVA PLUS test strip   . Ascorbic Acid (VITAMIN C) 1000 MG tablet Take 1,000 mg by mouth daily.  Marland Kitchen aspirin EC 81 MG tablet Take 81 mg by mouth every other day.   . B Complex Vitamins (B COMPLEX PO) Take by mouth.  . Calcium Carbonate-Vit D-Min (GNP CALCIUM PLUS 600 +D PO) Take 1 tablet by mouth every other day.   . Calcium Citrate (CITRACAL PO) Take by mouth.  . diltiazem (CARDIZEM CD) 240 MG 24 hr capsule TAKE 1 CAPSULE BY MOUTH DAILY.  Marland Kitchen ELIQUIS 5 MG TABS tablet TAKE 1 TABLET BY MOUTH 2  TIMES DAILY.  Marland Kitchen Flaxseed, Linseed, (FLAX SEED OIL) 1000 MG CAPS Take 1,000 mg by mouth daily.   Marland Kitchen glucose blood test strip Accu-Chek Aviva Plus test strips  . loratadine (CLARITIN) 10 MG tablet Take 10 mg by mouth daily.  . mometasone (NASONEX) 50 MCG/ACT nasal spray Place 1 spray into the nose daily as needed for congestion.  . multivitamin (THERAGRAN) per tablet Take 1 tablet by mouth daily.    . Omega-3 Fatty Acids (FISH OIL PO) Take 1 capsule by mouth daily. 1200 mg daily  . OMEPRAZOLE PO omeprazole 20 mg capsule,delayed release  . PATADAY 0.2 % SOLN Place 1 drop into both eyes daily.   . potassium chloride (K-DUR) 10 MEQ tablet TAKE 1 TABLET BY MOUTH DAILY.  Marland Kitchen triamterene-hydrochlorothiazide (MAXZIDE) 75-50 MG tablet Take 1 tablet by mouth daily.     Allergies:   Atenolol; Erythromycin; Erythromycin base; Plavix [clopidogrel bisulfate]; Sulfa antibiotics; and Sulfonamide derivatives   Social History   Tobacco Use  . Smoking status: Never Smoker  . Smokeless tobacco: Never Used  Substance Use Topics  . Alcohol use: No  . Drug use: No     Family Hx: The patient's family history includes Atrial fibrillation in her mother; Diabetes in her mother; Emphysema in her father; Hypertension in her mother; Other in her brother.  ROS:   Please see the history of present illness.     All other systems reviewed and are negative.   Labs/Other Tests and Data Reviewed:    Recent Labs: 09/10/2017: TSH 2.960 02/28/2018: ALT 23; BUN 18; Creatinine, Ser 0.89; Potassium 3.6; Sodium 141   Recent Lipid Panel Lab Results  Component Value Date/Time   CHOL 145 02/28/2018 09:54 AM   TRIG 53 02/28/2018 09:54 AM   HDL 64 02/28/2018 09:54 AM   CHOLHDL 2.3 02/28/2018 09:54 AM   CHOLHDL 3.4 07/02/2015 07:37 AM   LDLCALC 70 02/28/2018 09:54 AM    Wt Readings from Last 3 Encounters:  08/29/18 279 lb (126.6 kg)  06/28/18 286 lb (129.7 kg)  03/02/18 285 lb (129.3 kg)     Exam:    Vital Signs:   BP 128/77   Pulse (!) 59   Ht 5\' 8"  (1.727 m)   Wt 279 lb (126.6 kg)   LMP 07/22/2010   BMI 42.42 kg/m    No physical exam was performed today because of this being a telemetry visit.  See vital signs as above.  ASSESSMENT & PLAN:    1.  HTN:   BP is well controlled.  Continue meds  2.   Hyperlipidemia:  Continue meds Will get labs when we can   COVID-19 Education: The signs and symptoms of COVID-19 were discussed with the patient and how  to seek care for testing (follow up with PCP or arrange E-visit).  The importance of social distancing was discussed today.  Patient Risk:   After full review of this patients clinical status, I feel that they are at least moderate risk at this time.  Time:   Today, I have spent  21  minutes with the patient with telehealth technology discussing  Her care, plan, diet , exercise, labs   .     Medication Adjustments/Labs and Tests Ordered: Current medicines are reviewed at length with the patient today.  Concerns regarding medicines are outlined above.  Tests Ordered: No orders of the defined types were placed in this encounter.  Medication Changes: No orders of the defined types were placed in this encounter.   Disposition:  in 6 month(s)  Signed, Mertie Moores, MD  08/29/2018 5:30 PM    [address], [place], [place]  [address] Phone: [phone]; Fax: [phone]

## 2018-09-07 NOTE — Progress Notes (Signed)
Pt also has atrial fib which is well controlled

## 2018-10-01 ENCOUNTER — Other Ambulatory Visit: Payer: Self-pay | Admitting: Cardiovascular Disease

## 2018-10-04 ENCOUNTER — Other Ambulatory Visit: Payer: Medicare Other

## 2018-10-07 ENCOUNTER — Other Ambulatory Visit: Payer: Self-pay | Admitting: Cardiovascular Disease

## 2018-10-13 ENCOUNTER — Telehealth: Payer: Self-pay | Admitting: Cardiovascular Disease

## 2018-10-13 NOTE — Telephone Encounter (Signed)
  Patient wants Dr Acie Fredrickson to know that she had lab work done by her PCP and they will be sending the results over to Dr Acie Fredrickson. She had lab work done due to some swelling issues.

## 2018-10-13 NOTE — Telephone Encounter (Signed)
Pt had Lipid panel done at her PMD office Dr. Arelia Sneddon on 10/10/18 and was told it was good but would like Dr. Acie Fredrickson to review.... not in Dr. Elmarie Shiley box at the office will ask to refax and have med rec scan in for his review. PT says she will wait to hear back from [place] Next week.   [phone]  Dr. Arelia Sneddon.... office hours until 3pm...unable to get through kept saying to call back if reach the recording.

## 2018-10-21 ENCOUNTER — Telehealth: Payer: Self-pay | Admitting: *Deleted

## 2018-10-21 NOTE — Telephone Encounter (Signed)
Patient notified of result.  Please refer to phone note from today for complete details.   [name], [facility] 10/21/2018 10:55 AM   Pt wanted to let Dr. Acie Fredrickson that She has taken 1 and 1/2 tablets of the Triam/HCTZ for a few days now and her swelling has gotten better and she feels like she has gotten rid of some fluid. Pt states she will let us know if swelling gets worse again, though states Dr. Cathie Olden said already she can take up to 4 tablets daily of the Triam/HCTZ if need be. Pt thanked me for the call.

## 2018-10-21 NOTE — Telephone Encounter (Signed)
-----   Message from Thayer Headings, MD sent at 10/20/2018  5:13 PM EDT ----- Labs look stable.   Continue current meds.

## 2018-10-24 NOTE — Telephone Encounter (Signed)
See additional telephone encounter dated 10/20/18

## 2018-10-24 NOTE — Telephone Encounter (Signed)
Spoke with patient regarding the comment that she was advised by Dr. Acie Fredrickson that she could take up to 4 tablets of the Triamterene/HCTZ 75-50 mg daily. I advised that Dr. Acie Fredrickson did not recall making that statement and that he would prefer that she used furosemide 20 mg daily PRN for swelling. Patient states those directions were given by Dr. Arelia Sneddon, her PCP and that she also prefers not to take that much of the Triam/HCTZ. She states she is having good results of weight and fluid loss and decreased swelling with 1.5 Triam/HCTZ tablets daily. She denies keeping her legs in a dependent position for long periods of time and states she has been busy with house and yard work during stay-at-home orders. Agrees to call back if she would like to try furosemide 20 mg prn. She thanked me for the call.

## 2018-11-30 ENCOUNTER — Other Ambulatory Visit: Payer: Self-pay | Admitting: Certified Nurse Midwife

## 2018-11-30 DIAGNOSIS — E2839 Other primary ovarian failure: Secondary | ICD-10-CM

## 2018-12-01 ENCOUNTER — Ambulatory Visit
Admission: RE | Admit: 2018-12-01 | Discharge: 2018-12-01 | Disposition: A | Discharge status: Home / Self Care | Payer: Medicare Other | Source: Ambulatory Visit | Attending: Certified Nurse Midwife | Admitting: Certified Nurse Midwife

## 2018-12-01 ENCOUNTER — Other Ambulatory Visit: Payer: Self-pay

## 2018-12-01 DIAGNOSIS — E2839 Other primary ovarian failure: Secondary | ICD-10-CM

## 2018-12-02 IMAGING — MG DIGITAL SCREENING BILATERAL MAMMOGRAM WITH TOMO AND CAD
8 series · 8 of 24 positions shown · non-contrast
Comparison: Previous exam(s).

CLINICAL DATA: Screening.

EXAM:
DIGITAL SCREENING BILATERAL MAMMOGRAM WITH TOMO AND CAD

[R MLO synth-2D]
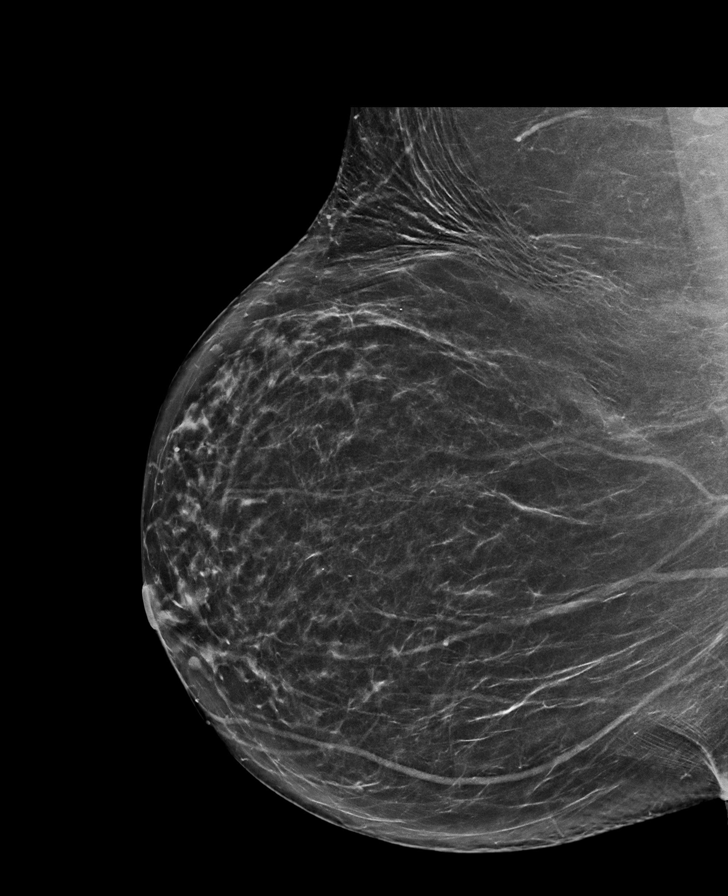

[L MLO synth-2D]
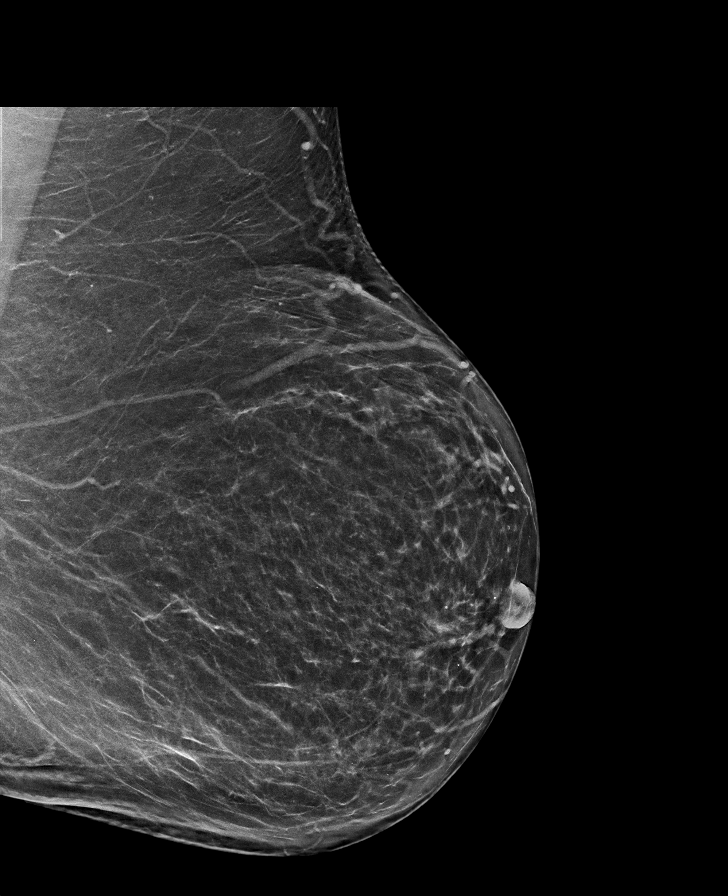

[L CC synth-2D]
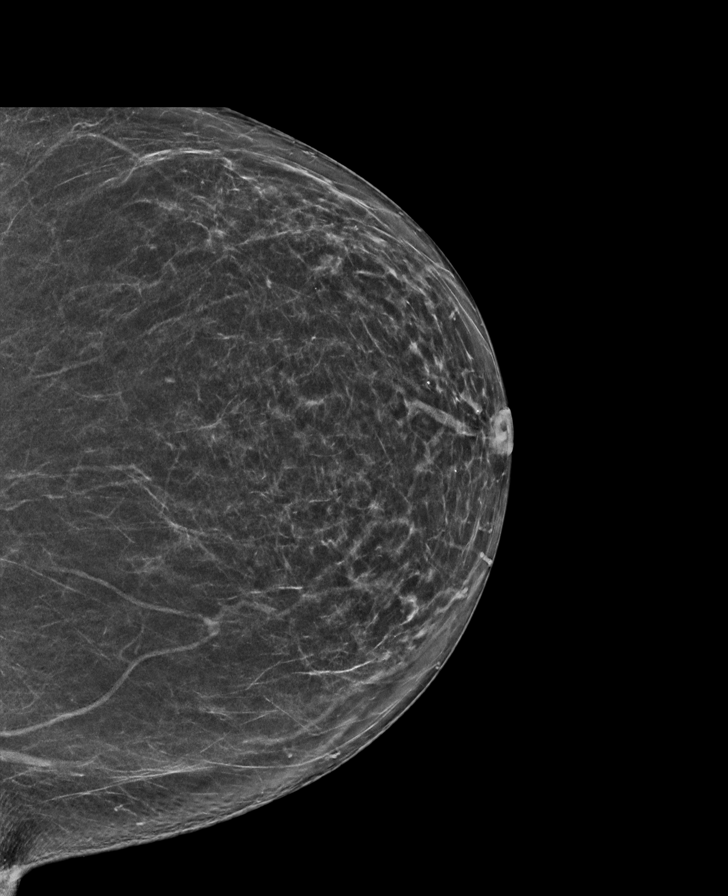

[R CC synth-2D]
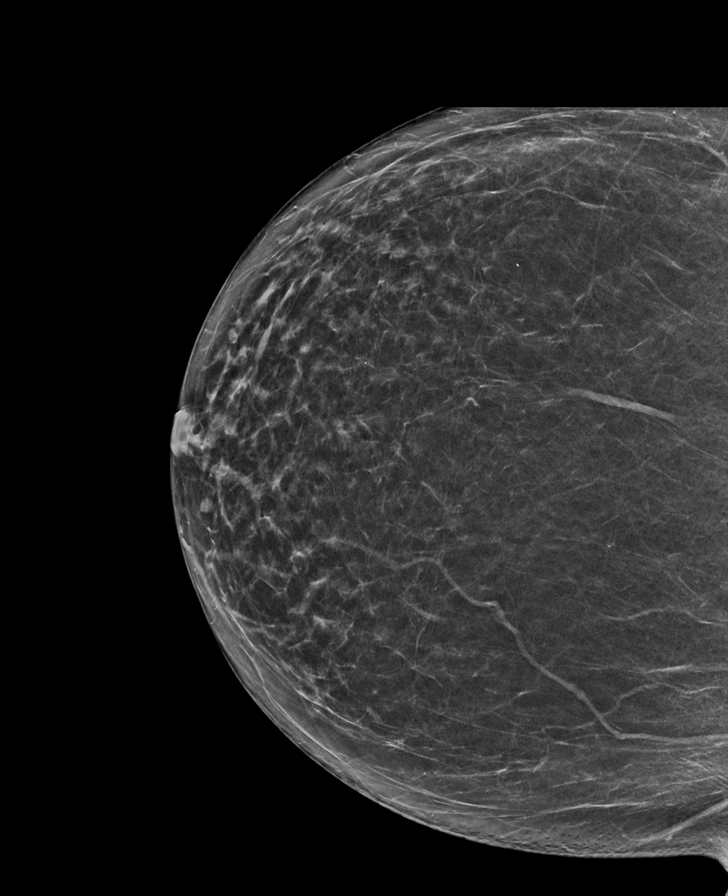

[L MLO tomo · tomo slice 42/83.0]
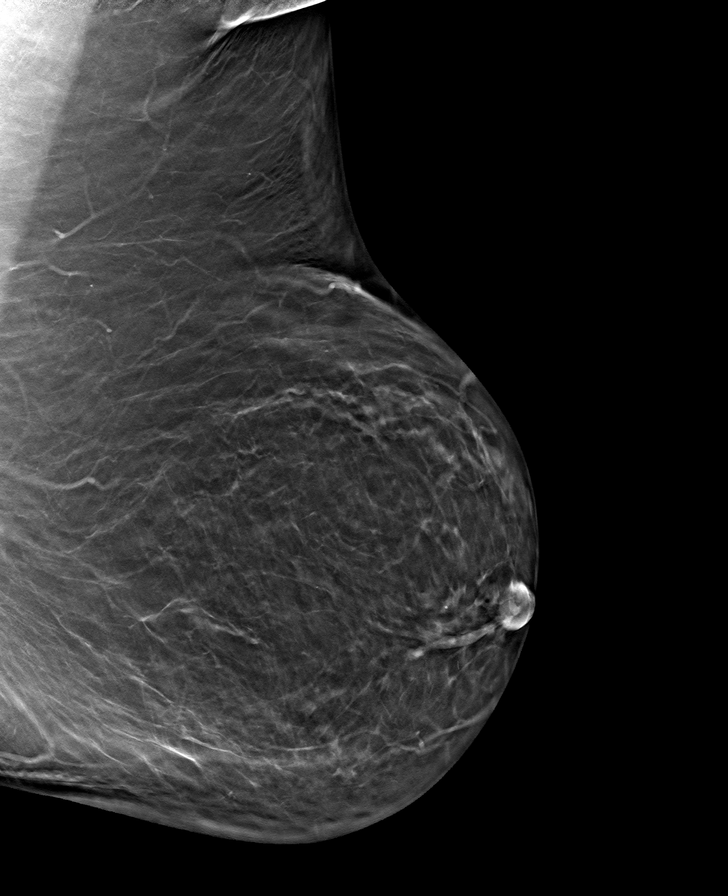

[L CC tomo · tomo slice 37/72.0]
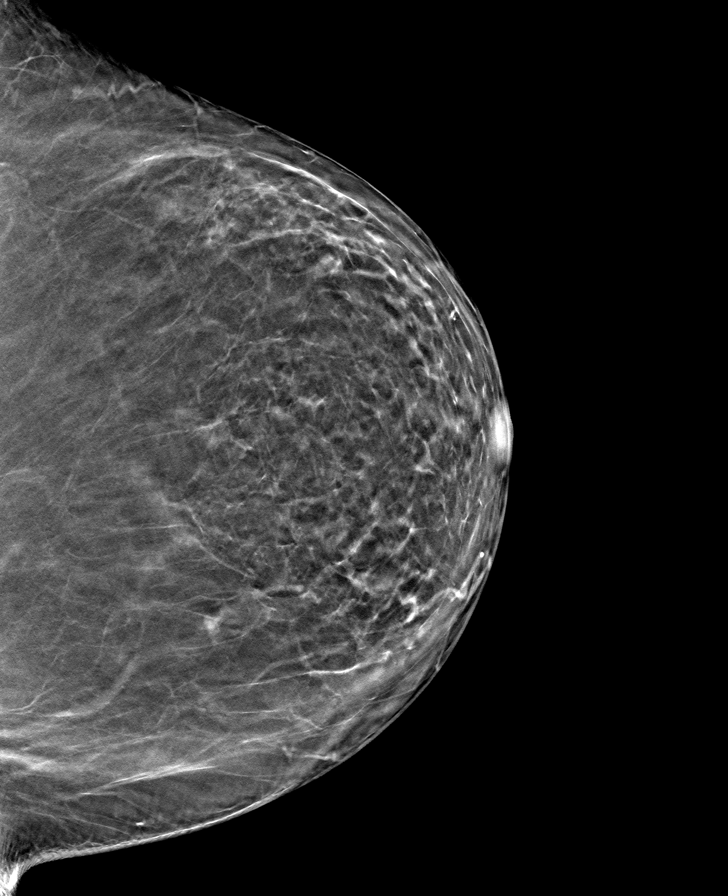

[R CC tomo · tomo slice 36/71.0]
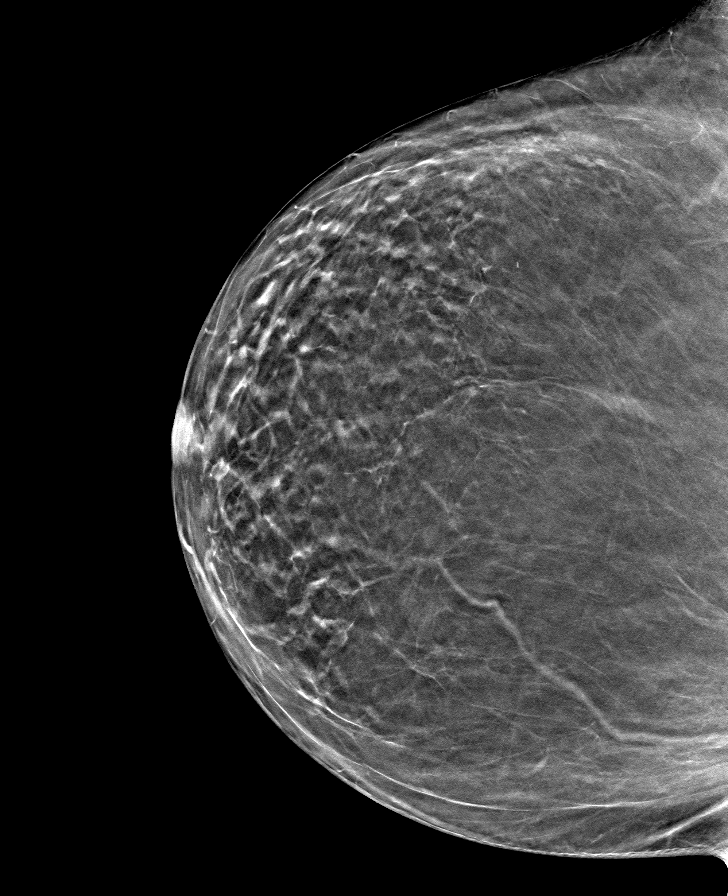

[R MLO tomo · tomo slice 41/80.0]
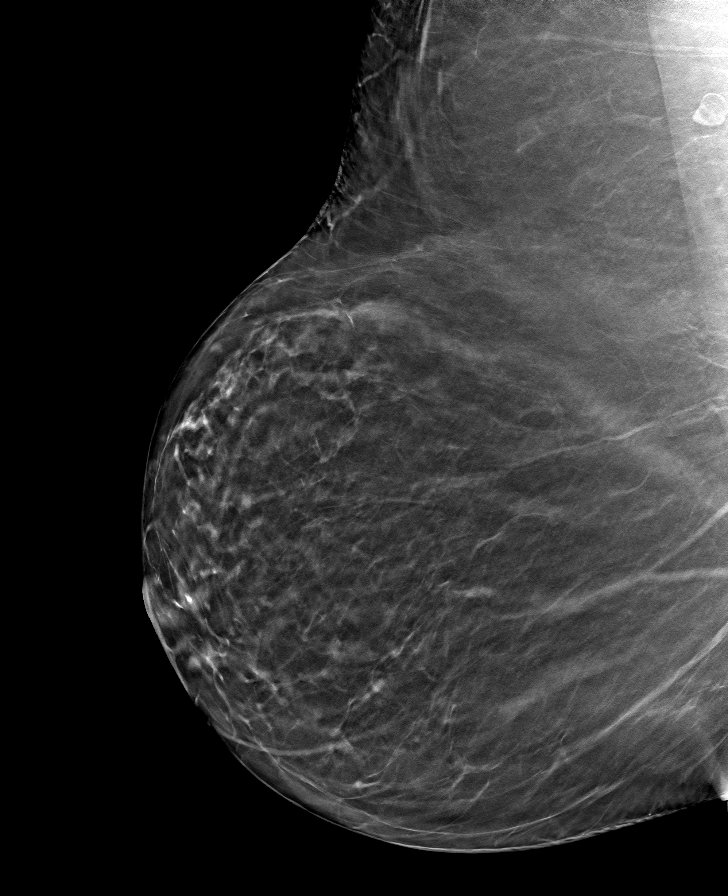

[8 of 24 positions shown; findings below may reference images not displayed]

ACR Breast Density Category b: There are scattered areas of
fibroglandular density.
FINDINGS: There are no findings suspicious for malignancy. Images were
processed with CAD.
IMPRESSION: No mammographic evidence of malignancy. A result letter of this
screening mammogram will be mailed directly to the patient.

RECOMMENDATION:
Screening mammogram in one year. (Code:[id number])

BI-RADS CATEGORY  1: Negative.

## 2018-12-10 DIAGNOSIS — K631 Perforation of intestine (nontraumatic): Secondary | ICD-10-CM | POA: Insufficient documentation

## 2018-12-11 DIAGNOSIS — Z7901 Long term (current) use of anticoagulants: Secondary | ICD-10-CM | POA: Insufficient documentation

## 2018-12-11 DIAGNOSIS — I4891 Unspecified atrial fibrillation: Secondary | ICD-10-CM | POA: Insufficient documentation

## 2018-12-23 ENCOUNTER — Emergency Department (HOSPITAL_COMMUNITY): Payer: Medicare Other

## 2018-12-23 ENCOUNTER — Other Ambulatory Visit: Payer: Self-pay

## 2018-12-23 ENCOUNTER — Inpatient Hospital Stay (HOSPITAL_COMMUNITY)
Admission: EM | Admit: 2018-12-23 | Discharge: 2018-12-27 | DRG: 392 | Disposition: A | Discharge status: Home / Self Care | Payer: Medicare Other | Attending: Internal Medicine | Admitting: Internal Medicine

## 2018-12-23 DIAGNOSIS — E876 Hypokalemia: Secondary | ICD-10-CM | POA: Diagnosis present

## 2018-12-23 DIAGNOSIS — Z79899 Other long term (current) drug therapy: Secondary | ICD-10-CM | POA: Diagnosis not present

## 2018-12-23 DIAGNOSIS — Z9109 Other allergy status, other than to drugs and biological substances: Secondary | ICD-10-CM | POA: Diagnosis not present

## 2018-12-23 DIAGNOSIS — E669 Obesity, unspecified: Secondary | ICD-10-CM | POA: Diagnosis present

## 2018-12-23 DIAGNOSIS — I480 Paroxysmal atrial fibrillation: Secondary | ICD-10-CM | POA: Diagnosis present

## 2018-12-23 DIAGNOSIS — K5792 Diverticulitis of intestine, part unspecified, without perforation or abscess without bleeding: Secondary | ICD-10-CM

## 2018-12-23 DIAGNOSIS — Z6841 Body Mass Index (BMI) 40.0 and over, adult: Secondary | ICD-10-CM

## 2018-12-23 DIAGNOSIS — Z833 Family history of diabetes mellitus: Secondary | ICD-10-CM

## 2018-12-23 DIAGNOSIS — N179 Acute kidney failure, unspecified: Secondary | ICD-10-CM | POA: Diagnosis present

## 2018-12-23 DIAGNOSIS — K5720 Diverticulitis of large intestine with perforation and abscess without bleeding: Principal | ICD-10-CM | POA: Diagnosis present

## 2018-12-23 DIAGNOSIS — Z96653 Presence of artificial knee joint, bilateral: Secondary | ICD-10-CM | POA: Diagnosis present

## 2018-12-23 DIAGNOSIS — E1165 Type 2 diabetes mellitus with hyperglycemia: Secondary | ICD-10-CM | POA: Diagnosis present

## 2018-12-23 DIAGNOSIS — E7800 Pure hypercholesterolemia, unspecified: Secondary | ICD-10-CM | POA: Diagnosis present

## 2018-12-23 DIAGNOSIS — Z882 Allergy status to sulfonamides status: Secondary | ICD-10-CM | POA: Diagnosis not present

## 2018-12-23 DIAGNOSIS — Z9049 Acquired absence of other specified parts of digestive tract: Secondary | ICD-10-CM

## 2018-12-23 DIAGNOSIS — E785 Hyperlipidemia, unspecified: Secondary | ICD-10-CM | POA: Diagnosis present

## 2018-12-23 DIAGNOSIS — Z7901 Long term (current) use of anticoagulants: Secondary | ICD-10-CM | POA: Diagnosis not present

## 2018-12-23 DIAGNOSIS — Z951 Presence of aortocoronary bypass graft: Secondary | ICD-10-CM

## 2018-12-23 DIAGNOSIS — Z8249 Family history of ischemic heart disease and other diseases of the circulatory system: Secondary | ICD-10-CM | POA: Diagnosis not present

## 2018-12-23 DIAGNOSIS — Z1159 Encounter for screening for other viral diseases: Secondary | ICD-10-CM

## 2018-12-23 DIAGNOSIS — Z825 Family history of asthma and other chronic lower respiratory diseases: Secondary | ICD-10-CM

## 2018-12-23 DIAGNOSIS — Z7982 Long term (current) use of aspirin: Secondary | ICD-10-CM

## 2018-12-23 DIAGNOSIS — Z85820 Personal history of malignant melanoma of skin: Secondary | ICD-10-CM | POA: Diagnosis not present

## 2018-12-23 DIAGNOSIS — I10 Essential (primary) hypertension: Secondary | ICD-10-CM | POA: Diagnosis present

## 2018-12-23 DIAGNOSIS — Z888 Allergy status to other drugs, medicaments and biological substances status: Secondary | ICD-10-CM

## 2018-12-23 LAB — URINALYSIS, ROUTINE W REFLEX MICROSCOPIC
Bacteria, UA: NONE SEEN
Bilirubin Urine: NEGATIVE
Glucose, UA: NEGATIVE mg/dL
Hgb urine dipstick: NEGATIVE
Ketones, ur: NEGATIVE mg/dL
Nitrite: NEGATIVE
Protein, ur: NEGATIVE mg/dL
Specific Gravity, Urine: 1.014 (ref 1.005–1.030)
pH: 6 (ref 5.0–8.0)

## 2018-12-23 LAB — CBC
HCT: 43.4 % (ref 36.0–46.0)
Hemoglobin: 14.2 g/dL (ref 12.0–15.0)
MCH: 30.7 pg (ref 26.0–34.0)
MCHC: 32.7 g/dL (ref 30.0–36.0)
MCV: 93.9 fL (ref 80.0–100.0)
Platelets: 333 10*3/uL (ref 150–400)
RBC: 4.62 MIL/uL (ref 3.87–5.11)
RDW: 13.8 % (ref 11.5–15.5)
WBC: 16.3 10*3/uL — ABNORMAL HIGH (ref 4.0–10.5)
nRBC: 0 % (ref 0.0–0.2)

## 2018-12-23 LAB — COMPREHENSIVE METABOLIC PANEL
ALT: 33 U/L (ref 0–44)
AST: 23 U/L (ref 15–41)
Albumin: 3.2 g/dL — ABNORMAL LOW (ref 3.5–5.0)
Alkaline Phosphatase: 59 U/L (ref 38–126)
Anion gap: 10 (ref 5–15)
BUN: 21 mg/dL (ref 8–23)
CO2: 26 mmol/L (ref 22–32)
Calcium: 9.7 mg/dL (ref 8.9–10.3)
Chloride: 103 mmol/L (ref 98–111)
Creatinine, Ser: 1.14 mg/dL — ABNORMAL HIGH (ref 0.44–1.00)
GFR calc Af Amer: 56 mL/min — ABNORMAL LOW (ref >60)
GFR calc non Af Amer: 48 mL/min — ABNORMAL LOW (ref >60)
Glucose, Bld: 132 mg/dL — ABNORMAL HIGH (ref 70–99)
Potassium: 3.2 mmol/L — ABNORMAL LOW (ref 3.5–5.1)
Sodium: 139 mmol/L (ref 135–145)
Total Bilirubin: 0.6 mg/dL (ref 0.3–1.2)
Total Protein: 6 g/dL — ABNORMAL LOW (ref 6.5–8.1)

## 2018-12-23 LAB — LIPASE, BLOOD: Lipase: 23 U/L (ref 11–51)

## 2018-12-23 MED ORDER — SODIUM CHLORIDE 0.9% FLUSH: 3.0000 mL | Freq: Once | INTRAVENOUS | Status: DC

## 2018-12-23 MED ORDER — SODIUM CHLORIDE 0.9 % IV BOLUS
1000.0000 mL | Freq: Once | INTRAVENOUS | Status: AC
  Administered 2018-12-23: 21:00:00 1000 mL via INTRAVENOUS

## 2018-12-23 MED ORDER — IOHEXOL 300 MG/ML  SOLN
100.0000 mL | Freq: Once | INTRAMUSCULAR | Status: AC | PRN
  Administered 2018-12-23: 22:00:00 100 mL via INTRAVENOUS

## 2018-12-23 MED ORDER — PIPERACILLIN-TAZOBACTAM 3.375 G IVPB 30 MIN
3.3750 g | Freq: Once | INTRAVENOUS | Status: AC
  Administered 2018-12-24: 3.375 g via INTRAVENOUS
  Filled 2018-12-23: qty 50

## 2018-12-23 NOTE — ED Triage Notes (Signed)
Per pt she was released on last Sunday from diverticulitis with ruptured colon. Pt is saying she is having a low grade fever. She is saying she needs to be checked again bc having complication again. Abdominal discomfort. Tender on the left side.

## 2018-12-23 NOTE — ED Provider Notes (Signed)
[facility] EMERGENCY DEPARTMENT Provider Note   CSN: [id number] Arrival date & time: 12/23/18  1545    History   Chief Complaint Chief Complaint  Patient presents with  . Post-op Problem  . Fever    HPI [name] is a 73 y.o. female.     The history is provided by the patient and medical records. No language interpreter was used.   [name] is a 73 y.o. female  with a PMH as listed below who presents to the Emergency Department complaining of low grade temperature and left lower abdominal discomfort which began this morning.  Patient was seen at an outside hospital in [facility] on July 4 where she was diagnosed with ruptured diverticulitis and admitted to the hospital.  She stayed inpatient until July 12 and was subsequently discharged home on Cipro and Flagyl which she has been taking as directed.  She states that she never underwent any surgery, but was treated with IV antibiotics and monitored closely.  She was feeling better, but this morning, checked her temperature and it was 99.6.  She has been having intermittent left lower quadrant abdominal discomfort and became concerned that her condition could be again worsening.  She denies any nausea or vomiting.  Not having any urinary symptoms.  No change in bowel habitus.  Past Medical History:  Diagnosis Date  . Abnormal Pap smear    Rare ASCUS-H  . Arthritis   . Atrial fibrillation ([place])   . Cancer (HCC)    melanoma rt leg  . Chest pain   . Diabetes mellitus without complication ([place])   . HTN (hypertension) 07/02/2015  . Hypercholesteremia   . Hyperglycemia 02/04/2011  . Hyperlipidemia 02/04/2011  . Hypertension    per pt no htn but needs meds for other reason  . Hypokalemia 05/17/2017  . Injury of right popliteal artery 05/17/2017  . Knee dislocation 05/17/2017  . Leg edema 02/04/2011  . Leg swelling 02/02/2011  . Leukocytosis 05/17/2017  . Obesity   . PAF  (paroxysmal atrial fibrillation) ([place]) 02/02/2011  . Popliteal artery injury 05/17/2017    Patient Active Problem List   Diagnosis Date Noted  . Pain in right foot 02/23/2018  . Pain of left hip joint 08/11/2017  . Stiffness of right knee 07/28/2017  . 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 Trochanteric bursitis of left hip 07/02/2017  . Knee dislocation 05/17/2017  . Popliteal artery injury 05/17/2017  . Hypokalemia 05/17/2017  . Leukocytosis 05/17/2017  . Injury of right popliteal artery 05/17/2017  . HTN (hypertension) 07/02/2015  . Hyperlipidemia 02/04/2011  . Hyperglycemia 02/04/2011  . Leg edema 02/04/2011  . PAF (paroxysmal atrial fibrillation) ([place]) 02/02/2011  . Leg swelling 02/02/2011    Past Surgical History:  Procedure Laterality Date  . BYPASS GRAFT POPLITEAL TO POPLITEAL Right 05/17/2017   Procedure: BYPASS GRAFT ABOVE KNEE POPLITEAL TO BELOW KNEE POPLITEAL USING NONREVERSED RIGHT GREATER SAPHENOUS VEIN;  Surgeon: Waynetta Sandy, MD;  Location: [place];  Service: Vascular;  Laterality: Right;  . CARDIAC CATHETERIZATION  07/11/2010   Est. EF of 60-65% -- Smooth and normal coronary arteries  -- Normal LV systolic function   . CATARACT EXTRACTION, BILATERAL     08-12-17, 08-26-17  . CHOLECYSTECTOMY    . CYSTOCELE REPAIR  07/22/2010   Vault suspension, cystocele repair, graft and cystoscopy  . FINGER SURGERY    . MELANOMA EXCISION     right leg 3/12  . NASAL  SINUS SURGERY    . NEUROMA SURGERY     rt foot  . TONSILLECTOMY    . TOTAL KNEE ARTHROPLASTY  06/20/2009   left knee  . TOTAL KNEE ARTHROPLASTY  12/27/2008   right knee  . TOTAL VAGINAL HYSTERECTOMY  07/22/2010   with bilateral salpingo-oophorectomy by Dr. Joan Flores  . VEIN HARVEST Right 05/17/2017   Procedure: RIGHT GREATER SAPHENOUS VEIN HARVEST;  Surgeon: Waynetta Sandy, MD;  Location: [place];  Service: Vascular;  Laterality: Right;     OB History    Gravida  2   Para  2   Term   2   Preterm      AB      Living  2     SAB      TAB      Ectopic      Multiple      Live Births  2            Home Medications    Prior to Admission medications   Medication Sig Start Date End Date Taking? Authorizing Provider  ACCU-CHEK AVIVA PLUS test strip  05/27/16   [provider]  Ascorbic Acid (VITAMIN C) 1000 MG tablet Take 1,000 mg by mouth daily.    [provider]  aspirin EC 81 MG tablet Take 81 mg by mouth every other day.     [provider]  B Complex Vitamins (B COMPLEX PO) Take by mouth.    [provider]  Calcium Carbonate-Vit D-Min (GNP CALCIUM PLUS 600 +D PO) Take 1 tablet by mouth every other day.     [provider]  Calcium Citrate (CITRACAL PO) Take by mouth.    [provider]  diltiazem (CARDIZEM CD) 240 MG 24 hr capsule TAKE 1 CAPSULE BY MOUTH DAILY. 10/07/18   Nahser, Wonda Cheng, MD  ELIQUIS 5 MG TABS tablet TAKE 1 TABLET BY MOUTH 2 TIMES DAILY. 05/19/18   Nahser, Wonda Cheng, MD  Flaxseed, Linseed, (FLAX SEED OIL) 1000 MG CAPS Take 1,000 mg by mouth daily.     [provider]  glucose blood test strip Accu-Chek Aviva Plus test strips    [provider]  loratadine (CLARITIN) 10 MG tablet Take 10 mg by mouth daily.    [provider]  mometasone (NASONEX) 50 MCG/ACT nasal spray Place 1 spray into the nose daily as needed for congestion. 01/05/17   [provider]  multivitamin [facility]) per tablet Take 1 tablet by mouth daily.      [provider]  Omega-3 Fatty Acids (FISH OIL PO) Take 1 capsule by mouth daily. 1200 mg daily    [provider]  OMEPRAZOLE PO omeprazole 20 mg capsule,delayed release    [provider]  PATADAY 0.2 % SOLN Place 1 drop into both eyes daily.  04/27/16   [provider]  potassium chloride (K-DUR) 10 MEQ tablet TAKE 1 TABLET BY MOUTH DAILY. 03/10/18   Nahser, Wonda Cheng, MD   triamterene-hydrochlorothiazide (MAXZIDE) 75-50 MG tablet Take 1.5 tablets by mouth daily.     [provider]    Family History Family History  Problem Relation Age of Onset  . Emphysema Father   . Atrial fibrillation Mother        has pacemaker  . Diabetes Mother   . Hypertension Mother   . Other Brother        bulbar palsy    Social History Social History   Tobacco  Use  . Smoking status: Never Smoker  . Smokeless tobacco: Never Used  Substance Use Topics  . Alcohol use: No  . Drug use: No     Allergies   Atenolol, Erythromycin, Erythromycin base, Plavix [clopidogrel bisulfate], Sulfa antibiotics, and Sulfonamide derivatives   Review of Systems Review of Systems  All other systems reviewed and are negative.    Physical Exam Updated Vital Signs BP 133/73   Pulse 73   Temp 98.2 F (36.8 C) (Oral)   Resp 16   LMP 07/22/2010   SpO2 95%   Physical Exam Vitals signs and nursing note reviewed.  Constitutional:      General: She is not in acute distress.    Appearance: She is well-developed.  HENT:     Head: Normocephalic and atraumatic.  Neck:     Musculoskeletal: Neck supple.  Cardiovascular:     Rate and Rhythm: Normal rate and regular rhythm.     Heart sounds: Normal heart sounds. No murmur.  Pulmonary:     Effort: Pulmonary effort is normal. No respiratory distress.     Breath sounds: Normal breath sounds.  Abdominal:     General: There is no distension.     Palpations: Abdomen is soft.     Comments: Mild left lower quadrant tenderness without rebound or guarding.  Skin:    General: Skin is warm and dry.  Neurological:     Mental Status: She is alert and oriented to person, place, and time.      ED Treatments / Results  Labs (all labs ordered are listed, but only abnormal results are displayed) Labs Reviewed  COMPREHENSIVE METABOLIC PANEL - Abnormal; Notable for the following components:      Result Value   Potassium 3.2 (*)     Glucose, Bld 132 (*)    Creatinine, Ser 1.14 (*)    Total Protein 6.0 (*)    Albumin 3.2 (*)    GFR calc non Af Amer 48 (*)    GFR calc Af Amer 56 (*)    All other components within normal limits  CBC - Abnormal; Notable for the following components:   WBC 16.3 (*)    All other components within normal limits  URINALYSIS, ROUTINE W REFLEX MICROSCOPIC - Abnormal; Notable for the following components:   Leukocytes,Ua TRACE (*)    All other components within normal limits  SARS CORONAVIRUS 2 (HOSPITAL ORDER, [facility])  LIPASE, BLOOD    EKG None  Radiology Ct Abdomen Pelvis W Contrast  Result Date: 12/23/2018 CLINICAL DATA:  Prior hospital admission for diverticulitis with colonic rupture. Persistent low-grade fever. EXAM: CT ABDOMEN AND PELVIS WITH CONTRAST TECHNIQUE: Multidetector CT imaging of the abdomen and pelvis was performed using the standard protocol following bolus administration of intravenous contrast. CONTRAST:  159mL OMNIPAQUE IOHEXOL 300 MG/ML  SOLN COMPARISON:  None. FINDINGS: Lower chest: Basilar atelectatic changes and areas of bronchiectasis with architectural distortion. Mild cardiomegaly. No pericardial effusion atherosclerotic calcification of the coronary arteries. Aortic leaflet calcifications are present. Hepatobiliary: No focal liver abnormality is seen. Patient is post cholecystectomy. Slight prominence of the biliary tree likely related to reservoir effect. No calcified intraductal gallstones. Pancreas: Fatty replacement of the pancreas. No pancreatic ductal dilatation or surrounding inflammatory changes. Spleen: Normal in size without focal abnormality. Accessory hilar splenule. Adrenals/Urinary Tract: Simple fluid attenuation cyst in the upper pole left kidney measuring up to 4.9 cm. No concerning renal masses. No hydronephrosis. Mild asymmetric bladder wall  thickening most pronounced along the left superolateral dome, likely reactive.  Stomach/Bowel: Small hiatal hernia. Partially calcified ovoid projection from the greater curvature of the stomach. Duodenal sweep is unremarkable. No small bowel thickening or dilatation. No obstruction. High-density material within the tip of the otherwise normal appendix. There is focal segmental thickening and pericolonic inflammation centered upon a diverticular outpouching in the proximal sigmoid colon (coronal 85, axial 80) with adjacent phlegmonous change with a developing air and fluid containing rim enhancing collection along the superolateral left bladder dome. No resultant obstruction. Vascular/Lymphatic: Atherosclerotic plaque within the normal caliber aorta. Few scattered reactive appearing mesenteric lymph nodes. No pathologically enlarged nodes. Reproductive: Uterus is surgically absent. No concerning adnexal lesions. Other: Rim enhancing collection in the low left pelvis, detailed above. No other foci of free fluid or gas. Pelvic floor laxity. Musculoskeletal: Pelvic floor laxity. No acute osseous abnormality or suspicious osseous lesion. Multilevel degenerative changes are present in the imaged portions of the spine. At least canal stenosis and moderate foraminal stenosis on the right at L3-4. IMPRESSION: Findings consistent with acute sigmoid diverticulitis with a developing air and fluid containing collection along the superolateral left bladder dome, consistent with abscess. No resultant obstruction. Asymmetric bladder thickening is likely reactive though could consider urinalysis if urinary symptoms are present. Partially calcified outpouching from the greater curvature of the stomach. Non-specific but could reflect prior gastric diverticula or remote contain ulcer perforation. Malignant etiology is considered less likely. Hiatal hernia. Aortic Atherosclerosis (ICD10-I70.0). Pelvic floor laxity. These results were called by telephone at the time of interpretation on 12/23/2018 at 10:46 pm to Dr.  Stark Jock, who verbally acknowledged these results. Electronically Signed   By: Lovena Le M.D.   On: 12/23/2018 22:46    Procedures Procedures (including critical care time)  Medications Ordered in ED Medications  sodium chloride flush (NS) 0.9 % injection 3 mL (0 mLs Intravenous Hold 12/23/18 2000)  piperacillin-tazobactam (ZOSYN) IVPB 3.375 g (has no administration in time range)  sodium chloride 0.9 % bolus 1,000 mL (1,000 mLs Intravenous New Bag/Given 12/23/18 2044)  iohexol (OMNIPAQUE) 300 MG/ML solution 100 mL (100 mLs Intravenous Contrast Given 12/23/18 2205)     Initial Impression / Assessment and Plan / ED Course  I have reviewed the triage vital signs and the nursing notes.  Pertinent labs & imaging results that were available during my care of the patient were reviewed by me and considered in my medical decision making (see chart for details).       [name] is a 73 y.o. female who presents to ED for left lower quadrant discomfort associated with low-grade temperatures at home (Tmax of 99.6).  Patient was admitted at outside hospital from July 4 to July 12 for ruptured diverticulitis.  Per chart review, patient was observed and treated with IV antibiotics never undergoing surgical intervention.  She was discharged home with Cipro and Flagyl and endorses compliance with such. On exam today, she has low-grade temp of 99.8.  She is hemodynamically stable.  She has some mild tenderness to the left lower quadrant.  White count today elevated at 16.3.  Per review of paperwork from previous admission, she was discharged home with a white count of 8.  CT shows findings consistent with acute sigmoid diverticulitis with a developing air and fluid containing collection along the superior lateral left bladder dome concerning for abscess. Discussed with Dr. Dema Severin of surgery. Surgery to see. Started on ABX. Hospitalist consulted who will admit.   Patient  seen by and discussed with Dr.  Stark Jock who agrees with treatment plan.    Final Clinical Impressions(s) / ED Diagnoses   Final diagnoses:  Diverticulitis    ED Discharge Orders    None       Shandie Bertz, Ozella Almond, PA-C 12/24/18 0010    Veryl Speak, MD 12/25/18 2118

## 2018-12-24 ENCOUNTER — Encounter (HOSPITAL_COMMUNITY): Payer: Self-pay | Admitting: General Practice

## 2018-12-24 DIAGNOSIS — I10 Essential (primary) hypertension: Secondary | ICD-10-CM

## 2018-12-24 DIAGNOSIS — K5720 Diverticulitis of large intestine with perforation and abscess without bleeding: Principal | ICD-10-CM

## 2018-12-24 DIAGNOSIS — I480 Paroxysmal atrial fibrillation: Secondary | ICD-10-CM

## 2018-12-24 LAB — CBC WITH DIFFERENTIAL/PLATELET
Abs Immature Granulocytes: 0.07 10*3/uL (ref 0.00–0.07)
Basophils Absolute: 0 10*3/uL (ref 0.0–0.1)
Basophils Relative: 0 %
Eosinophils Absolute: 0.2 10*3/uL (ref 0.0–0.5)
Eosinophils Relative: 2 %
HCT: 43.4 % (ref 36.0–46.0)
Hemoglobin: 14.2 g/dL (ref 12.0–15.0)
Immature Granulocytes: 1 %
Lymphocytes Relative: 17 %
Lymphs Abs: 1.9 10*3/uL (ref 0.7–4.0)
MCH: 30.3 pg (ref 26.0–34.0)
MCHC: 32.7 g/dL (ref 30.0–36.0)
MCV: 92.7 fL (ref 80.0–100.0)
Monocytes Absolute: 1.1 10*3/uL — ABNORMAL HIGH (ref 0.1–1.0)
Monocytes Relative: 9 %
Neutro Abs: 8 10*3/uL — ABNORMAL HIGH (ref 1.7–7.7)
Neutrophils Relative %: 71 %
Platelets: 243 10*3/uL (ref 150–400)
RBC: 4.68 MIL/uL (ref 3.87–5.11)
RDW: 14 % (ref 11.5–15.5)
WBC: 11.3 10*3/uL — ABNORMAL HIGH (ref 4.0–10.5)
nRBC: 0 % (ref 0.0–0.2)

## 2018-12-24 LAB — GLUCOSE, CAPILLARY
Glucose-Capillary: 122 mg/dL — ABNORMAL HIGH (ref 70–99)
Glucose-Capillary: 89 mg/dL (ref 70–99)
Glucose-Capillary: 127 mg/dL — ABNORMAL HIGH (ref 70–99)

## 2018-12-24 LAB — COMPREHENSIVE METABOLIC PANEL
ALT: 27 U/L (ref 0–44)
AST: 22 U/L (ref 15–41)
Albumin: 3 g/dL — ABNORMAL LOW (ref 3.5–5.0)
Alkaline Phosphatase: 54 U/L (ref 38–126)
Anion gap: 12 (ref 5–15)
BUN: 18 mg/dL (ref 8–23)
CO2: 20 mmol/L — ABNORMAL LOW (ref 22–32)
Calcium: 9.1 mg/dL (ref 8.9–10.3)
Chloride: 106 mmol/L (ref 98–111)
Creatinine, Ser: 1.02 mg/dL — ABNORMAL HIGH (ref 0.44–1.00)
GFR calc Af Amer: >60 mL/min (ref >60)
GFR calc non Af Amer: 55 mL/min — ABNORMAL LOW (ref >60)
Glucose, Bld: 112 mg/dL — ABNORMAL HIGH (ref 70–99)
Potassium: 3.1 mmol/L — ABNORMAL LOW (ref 3.5–5.1)
Sodium: 138 mmol/L (ref 135–145)
Total Bilirubin: 0.6 mg/dL (ref 0.3–1.2)
Total Protein: 5.5 g/dL — ABNORMAL LOW (ref 6.5–8.1)

## 2018-12-24 LAB — SARS CORONAVIRUS 2 BY RT PCR (HOSPITAL ORDER, PERFORMED IN ~~LOC~~ HOSPITAL LAB): SARS Coronavirus 2: NEGATIVE

## 2018-12-24 LAB — PROTIME-INR
INR: 1.3 — ABNORMAL HIGH (ref 0.8–1.2)
Prothrombin Time: 16.4 seconds — ABNORMAL HIGH (ref 11.4–15.2)

## 2018-12-24 LAB — APTT
aPTT: 56 seconds — ABNORMAL HIGH (ref 24–36)
aPTT: 80 seconds — ABNORMAL HIGH (ref 24–36)
aPTT: 76 seconds — ABNORMAL HIGH (ref 24–36)

## 2018-12-24 LAB — HEPARIN LEVEL (UNFRACTIONATED): Heparin Unfractionated: >2.2 IU/mL — ABNORMAL HIGH (ref 0.30–0.70)

## 2018-12-24 MED ORDER — DEXTROSE-NACL 5-0.9 % IV SOLN
INTRAVENOUS | Status: AC
  Administered 2018-12-24: 04:00:00 via INTRAVENOUS

## 2018-12-24 MED ORDER — POTASSIUM CHLORIDE 10 MEQ/100ML IV SOLN
10.0000 meq | INTRAVENOUS | Status: AC
  Administered 2018-12-24 (×2): 10 meq via INTRAVENOUS
  Filled 2018-12-24 (×2): qty 100

## 2018-12-24 MED ORDER — HYDRALAZINE HCL 20 MG/ML IJ SOLN: 10.0000 mg | INTRAMUSCULAR | Status: DC | PRN

## 2018-12-24 MED ORDER — DILTIAZEM HCL ER COATED BEADS 120 MG PO CP24
240.0000 mg | ORAL_CAPSULE | Freq: Every day | ORAL | Status: DC
  Administered 2018-12-24 – 2018-12-27 (×4): 240 mg via ORAL
  Filled 2018-12-24 (×4): qty 2

## 2018-12-24 MED ORDER — ACETAMINOPHEN 325 MG PO TABS
650.0000 mg | ORAL_TABLET | Freq: Four times a day (QID) | ORAL | Status: DC | PRN
  Administered 2018-12-24: 650 mg via ORAL
  Filled 2018-12-24: qty 2

## 2018-12-24 MED ORDER — PIPERACILLIN-TAZOBACTAM 3.375 G IVPB
3.3750 g | Freq: Three times a day (TID) | INTRAVENOUS | Status: DC
  Administered 2018-12-24 – 2018-12-27 (×10): 3.375 g via INTRAVENOUS
  Filled 2018-12-24 (×12): qty 50

## 2018-12-24 MED ORDER — ONDANSETRON HCL 4 MG PO TABS: 4.0000 mg | ORAL_TABLET | Freq: Four times a day (QID) | ORAL | Status: DC | PRN

## 2018-12-24 MED ORDER — ONDANSETRON HCL 4 MG/2ML IJ SOLN: 4.0000 mg | Freq: Four times a day (QID) | INTRAMUSCULAR | Status: DC | PRN

## 2018-12-24 MED ORDER — HEPARIN (PORCINE) 25000 UT/250ML-% IV SOLN
1400.0000 [IU]/h | INTRAVENOUS | Status: DC
  Administered 2018-12-24 – 2018-12-27 (×5): 1400 [IU]/h via INTRAVENOUS
  Filled 2018-12-24 (×5): qty 250

## 2018-12-24 MED ORDER — ACETAMINOPHEN 650 MG RE SUPP: 650.0000 mg | Freq: Four times a day (QID) | RECTAL | Status: DC | PRN

## 2018-12-24 NOTE — Plan of Care (Signed)
  Problem: Clinical Measurements: Goal: Ability to maintain clinical measurements within normal limits will improve Outcome: Progressing   Problem: Clinical Measurements: Goal: Will remain free from infection Outcome: Progressing   Problem: Clinical Measurements: Goal: Diagnostic test results will improve Outcome: Progressing   

## 2018-12-24 NOTE — ED Notes (Signed)
ED TO INPATIENT HANDOFF REPORT  ED Nurse Name and Phone #: Maxim Bedel [phone]  S Name/Age/Gender [name] 73 y.o. female Room/Bed: 026C/026C  Code Status   Code Status: Prior  Home/SNF/Other Home Patient oriented to: self, place, time and situation Is this baseline? Yes   Triage Complete: Triage complete  Chief Complaint Post opp, Fever, lower stomach pain  Triage Note Per pt she was released on last Sunday from diverticulitis with ruptured colon. Pt is saying she is having a low grade fever. She is saying she needs to be checked again bc having complication again. Abdominal discomfort. Tender on the left side.    Allergies Allergies  Allergen Reactions  . Atenolol Cough  . Erythromycin     Nausea  . Erythromycin Base Nausea Only  . Lactose Intolerance (Gi) Diarrhea    Just MILK   . Plavix [Clopidogrel Bisulfate]     cough  . Sulfa Antibiotics Other (See Comments)    Turned eyes yellow   . Sulfonamide Derivatives     Rash    Level of Care/Admitting Diagnosis ED Disposition    ED Disposition Condition [facility] Area: [place] [[id number]]  Level of Care: Telemetry Medical [104]  Covid Evaluation: Asymptomatic Screening Protocol (No Symptoms)  Diagnosis: Colonic diverticular abscess [[id number]]  Admitting Physician: Rise Patience [[id number]]  Attending Physician: Rise Patience [phone]  Estimated length of stay: past midnight tomorrow  Certification:: I certify this patient will need inpatient services for at least 2 midnights  PT Class (Do Not Modify): Inpatient [101]  PT Acc Code (Do Not Modify): Private [1]       B Medical/Surgery History Past Medical History:  Diagnosis Date  . Abnormal Pap smear    Rare ASCUS-H  . Arthritis   . Atrial fibrillation ([place])   . Cancer (HCC)    melanoma rt leg  . Chest pain   . Diabetes mellitus without complication ([place])   . HTN (hypertension) 07/02/2015  .  Hypercholesteremia   . Hyperglycemia 02/04/2011  . Hyperlipidemia 02/04/2011  . Hypertension    per pt no htn but needs meds for other reason  . Hypokalemia 05/17/2017  . Injury of right popliteal artery 05/17/2017  . Knee dislocation 05/17/2017  . Leg edema 02/04/2011  . Leg swelling 02/02/2011  . Leukocytosis 05/17/2017  . Obesity   . PAF (paroxysmal atrial fibrillation) ([place]) 02/02/2011  . Popliteal artery injury 05/17/2017   Past Surgical History:  Procedure Laterality Date  . BYPASS GRAFT POPLITEAL TO POPLITEAL Right 05/17/2017   Procedure: BYPASS GRAFT ABOVE KNEE POPLITEAL TO BELOW KNEE POPLITEAL USING NONREVERSED RIGHT GREATER SAPHENOUS VEIN;  Surgeon: Waynetta Sandy, MD;  Location: [facility];  Service: Vascular;  Laterality: Right;  . CARDIAC CATHETERIZATION  07/11/2010   Est. EF of 60-65% -- Smooth and normal coronary arteries  -- Normal LV systolic function   . CATARACT EXTRACTION, BILATERAL     08-12-17, 08-26-17  . CHOLECYSTECTOMY    . CYSTOCELE REPAIR  07/22/2010   Vault suspension, cystocele repair, graft and cystoscopy  . FINGER SURGERY    . MELANOMA EXCISION     right leg 3/12  . NASAL SINUS SURGERY    . NEUROMA SURGERY     rt foot  . TONSILLECTOMY    . TOTAL KNEE ARTHROPLASTY  06/20/2009   left knee  . TOTAL KNEE ARTHROPLASTY  12/27/2008   right knee  . TOTAL VAGINAL HYSTERECTOMY  07/22/2010  with bilateral salpingo-oophorectomy by Dr. Joan Flores  . VEIN HARVEST Right 05/17/2017   Procedure: RIGHT GREATER SAPHENOUS VEIN HARVEST;  Surgeon: Waynetta Sandy, MD;  Location: [facility];  Service: Vascular;  Laterality: Right;     A IV Location/Drains/Wounds Patient Lines/Drains/Airways Status   Active Line/Drains/Airways    Name:   Placement date:   Placement time:   Site:   Days:   Peripheral IV 12/23/18 Right Antecubital   12/23/18    2040    Antecubital   1   Incision (Closed) 05/17/17 Leg Right   05/17/17    2316     586   Incision (Closed) 05/17/17  Thigh Right   05/17/17    2316     586          Intake/Output Last 24 hours  Intake/Output Summary (Last 24 hours) at 12/24/2018 0039 Last data filed at 12/24/2018 0039 Gross per 24 hour  Intake 1000 ml  Output -  Net 1000 ml    Labs/Imaging Results for orders placed or performed during the hospital encounter of 12/23/18 (from the past 48 hour(s))  Urinalysis, Routine w reflex microscopic     Status: Abnormal   Collection Time: 12/23/18  4:22 PM  Result Value Ref Range   Color, Urine YELLOW YELLOW   APPearance CLEAR CLEAR   Specific Gravity, Urine 1.014 1.005 - 1.030   pH 6.0 5.0 - 8.0   Glucose, UA NEGATIVE NEGATIVE mg/dL   Hgb urine dipstick NEGATIVE NEGATIVE   Bilirubin Urine NEGATIVE NEGATIVE   Ketones, ur NEGATIVE NEGATIVE mg/dL   Protein, ur NEGATIVE NEGATIVE mg/dL   Nitrite NEGATIVE NEGATIVE   Leukocytes,Ua TRACE (A) NEGATIVE   RBC / HPF 0-5 0 - 5 RBC/hpf   WBC, UA 0-5 0 - 5 WBC/hpf   Bacteria, UA NONE SEEN NONE SEEN   Squamous Epithelial / LPF 0-5 0 - 5    Comment: Performed at [facility], [address], [place], [place] [address]  Lipase, blood     Status: None   Collection Time: 12/23/18  4:27 PM  Result Value Ref Range   Lipase 23 11 - 51 U/L    Comment: Performed at [facility] [address] [place], [place] [address]  Comprehensive metabolic panel     Status: Abnormal   Collection Time: 12/23/18  4:27 PM  Result Value Ref Range   Sodium 139 135 - 145 mmol/L   Potassium 3.2 (L) 3.5 - 5.1 mmol/L   Chloride 103 98 - 111 mmol/L   CO2 26 22 - 32 mmol/L   Glucose, Bld 132 (H) 70 - 99 mg/dL   BUN 21 8 - 23 mg/dL   Creatinine, Ser 1.14 (H) 0.44 - 1.00 mg/dL   Calcium 9.7 8.9 - 10.3 mg/dL   Total Protein 6.0 (L) 6.5 - 8.1 g/dL   Albumin 3.2 (L) 3.5 - 5.0 g/dL   AST 23 15 - 41 U/L   ALT 33 0 - 44 U/L   Alkaline Phosphatase 59 38 - 126 U/L   Total Bilirubin 0.6 0.3 - 1.2 mg/dL   GFR calc non Af Amer 48 (L) >60 mL/min   GFR calc Af Amer 56  (L) >60 mL/min   Anion gap 10 5 - 15    Comment: Performed at [facility], [place] [address], [place], [place] [address]  CBC     Status: Abnormal   Collection Time: 12/23/18  4:27 PM  Result  Value Ref Range   WBC 16.3 (H) 4.0 - 10.5 K/uL   RBC 4.62 3.87 - 5.11 MIL/uL   Hemoglobin 14.2 12.0 - 15.0 g/dL   HCT 43.4 36.0 - 46.0 %   MCV 93.9 80.0 - 100.0 fL   MCH 30.7 26.0 - 34.0 pg   MCHC 32.7 30.0 - 36.0 g/dL   RDW 13.8 11.5 - 15.5 %   Platelets 333 150 - 400 K/uL   nRBC 0.0 0.0 - 0.2 %    Comment: Performed at [facility], [place] [address] [place], [place] [address]   Ct Abdomen Pelvis W Contrast  Result Date: 12/23/2018 CLINICAL DATA:  Prior hospital admission for diverticulitis with colonic rupture. Persistent low-grade fever. EXAM: CT ABDOMEN AND PELVIS WITH CONTRAST TECHNIQUE: Multidetector CT imaging of the abdomen and pelvis was performed using the standard protocol following bolus administration of intravenous contrast. CONTRAST:  17mL OMNIPAQUE IOHEXOL 300 MG/ML  SOLN COMPARISON:  None. FINDINGS: Lower chest: Basilar atelectatic changes and areas of bronchiectasis with architectural distortion. Mild cardiomegaly. No pericardial effusion atherosclerotic calcification of the coronary arteries. Aortic leaflet calcifications are present. Hepatobiliary: No focal liver abnormality is seen. Patient is post cholecystectomy. Slight prominence of the biliary tree likely related to reservoir effect. No calcified intraductal gallstones. Pancreas: Fatty replacement of the pancreas. No pancreatic ductal dilatation or surrounding inflammatory changes. Spleen: Normal in size without focal abnormality. Accessory hilar splenule. Adrenals/Urinary Tract: Simple fluid attenuation cyst in the upper pole left kidney measuring up to 4.9 cm. No concerning renal masses. No hydronephrosis. Mild asymmetric bladder wall thickening most pronounced along the left superolateral dome, likely reactive.  Stomach/Bowel: Small hiatal hernia. Partially calcified ovoid projection from the greater curvature of the stomach. Duodenal sweep is unremarkable. No small bowel thickening or dilatation. No obstruction. High-density material within the tip of the otherwise normal appendix. There is focal segmental thickening and pericolonic inflammation centered upon a diverticular outpouching in the proximal sigmoid colon (coronal 85, axial 80) with adjacent phlegmonous change with a developing air and fluid containing rim enhancing collection along the superolateral left bladder dome. No resultant obstruction. Vascular/Lymphatic: Atherosclerotic plaque within the normal caliber aorta. Few scattered reactive appearing mesenteric lymph nodes. No pathologically enlarged nodes. Reproductive: Uterus is surgically absent. No concerning adnexal lesions. Other: Rim enhancing collection in the low left pelvis, detailed above. No other foci of free fluid or gas. Pelvic floor laxity. Musculoskeletal: Pelvic floor laxity. No acute osseous abnormality or suspicious osseous lesion. Multilevel degenerative changes are present in the imaged portions of the spine. At least canal stenosis and moderate foraminal stenosis on the right at L3-4. IMPRESSION: Findings consistent with acute sigmoid diverticulitis with a developing air and fluid containing collection along the superolateral left bladder dome, consistent with abscess. No resultant obstruction. Asymmetric bladder thickening is likely reactive though could consider urinalysis if urinary symptoms are present. Partially calcified outpouching from the greater curvature of the stomach. Non-specific but could reflect prior gastric diverticula or remote contain ulcer perforation. Malignant etiology is considered less likely. Hiatal hernia. Aortic Atherosclerosis (ICD10-I70.0). Pelvic floor laxity. These results were called by telephone at the time of interpretation on 12/23/2018 at 10:46 pm to Dr.  Stark Jock, who verbally acknowledged these results. Electronically Signed   By: Lovena Le M.D.   On: 12/23/2018 22:46    Pending Labs Unresulted Labs (From admission, onward)    Start     Ordered   12/23/18 2253  SARS Coronavirus 2 (CEPHEID - Performed in  [facility] hospital lab), Hosp Order  (Asymptomatic Patients Labs)  Once,   STAT    Question:  Rule Out  Answer:  Yes   12/23/18 2252          Vitals/Pain Today's Vitals   12/23/18 2115 12/23/18 2130 12/23/18 2145 12/24/18 0021  BP: 124/71 123/73 133/73 123/62  Pulse: 75 74 73 71  Resp:   16 16  Temp:      TempSrc:      SpO2: 100% 93% 95% 99%  PainSc:        Isolation Precautions No active isolations  Medications Medications  sodium chloride flush (NS) 0.9 % injection 3 mL (0 mLs Intravenous Hold 12/23/18 2000)  piperacillin-tazobactam (ZOSYN) IVPB 3.375 g (3.375 g Intravenous New Bag/Given 12/24/18 0018)  sodium chloride 0.9 % bolus 1,000 mL (0 mLs Intravenous Stopped 12/24/18 0039)  iohexol (OMNIPAQUE) 300 MG/ML solution 100 mL (100 mLs Intravenous Contrast Given 12/23/18 2205)    Mobility walks Moderate fall risk      R Recommendations: See Admitting Provider Note  Report given to:   Additional Notes:

## 2018-12-24 NOTE — Progress Notes (Signed)
ANTICOAGULATION CONSULT NOTE - Follow-up Consult  Pharmacy Consult for Heparin Indication: atrial fibrillation  Allergies  Allergen Reactions  . Atenolol Cough  . Erythromycin     Nausea  . Erythromycin Base Nausea Only  . Lactose Intolerance (Gi) Diarrhea    Just MILK   . Plavix [Clopidogrel Bisulfate]     cough  . Sulfa Antibiotics Other (See Comments)    Turned eyes yellow   . Sulfonamide Derivatives     Rash    Patient Measurements:   Heparin Dosing Weight: 92.4 kg  Vital Signs: Temp: 98.9 F (37.2 C) (07/18 0931) Temp Source: Oral (07/18 0931) BP: 135/76 (07/18 0931) Pulse Rate: 72 (07/18 0931)  Labs: Recent Labs    12/23/18 1627 12/24/18 0211 12/24/18 0545 12/24/18 1201  HGB 14.2 14.2  --   --   HCT 43.4 43.4  --   --   PLT 333 243  --   --   APTT  --   --  56* 80*  LABPROT  --   --  16.4*  --   INR  --   --  1.3*  --   HEPARINUNFRC  --  >2.20*  --   --   CREATININE 1.14* 1.02*  --   --     CrCl cannot be calculated (Unknown ideal weight.).   Medical History: Past Medical History:  Diagnosis Date  . Abnormal Pap smear    Rare ASCUS-H  . Arthritis   . Atrial fibrillation ([place])   . Cancer (HCC)    melanoma rt leg  . Chest pain   . Diabetes mellitus without complication ([place])   . HTN (hypertension) 07/02/2015  . Hypercholesteremia   . Hyperglycemia 02/04/2011  . Hyperlipidemia 02/04/2011  . Hypertension    per pt no htn but needs meds for other reason  . Hypokalemia 05/17/2017  . Injury of right popliteal artery 05/17/2017  . Knee dislocation 05/17/2017  . Leg edema 02/04/2011  . Leg swelling 02/02/2011  . Leukocytosis 05/17/2017  . Obesity   . PAF (paroxysmal atrial fibrillation) ([place]) 02/02/2011  . Popliteal artery injury 05/17/2017    Medications:  Medications Prior to Admission  Medication Sig Dispense Refill Last Dose  . aspirin EC 81 MG tablet Take 81 mg by mouth See admin instructions. Lydia Guiles, thur, sat   12/22/2018 at 2115  .  B Complex Vitamins (B COMPLEX PO) Take 1 tablet by mouth every evening.    Past Week at Unknown time  . Calcium Carbonate-Vit D-Min (GNP CALCIUM PLUS 600 +D PO) Take 1 tablet by mouth every other day.    Past Month at Unknown time  . ciprofloxacin (CIPRO) 500 MG tablet Take 500 mg by mouth every 12 (twelve) hours.   12/23/2018 at 0900  . diltiazem (CARDIZEM CD) 240 MG 24 hr capsule TAKE 1 CAPSULE BY MOUTH DAILY. (Patient taking differently: Take 240 mg by mouth daily. ) 90 capsule 2 12/23/2018 at Unknown time  . ELIQUIS 5 MG TABS tablet TAKE 1 TABLET BY MOUTH 2 TIMES DAILY. (Patient taking differently: Take 5 mg by mouth 2 (two) times daily. ) 60 tablet 9 12/23/2018 at 0900  . loratadine (CLARITIN) 10 MG tablet Take 10 mg by mouth daily.   12/23/2018 at Unknown time  . metroNIDAZOLE (FLAGYL) 500 MG tablet Take 500 mg by mouth 3 (three) times daily.   12/23/2018 at 1500  . mometasone (NASONEX) 50 MCG/ACT nasal spray Place 1 spray into the nose daily as needed  for congestion.  0 Past Month at Unknown time  . multivitamin (THERAGRAN) per tablet Take 1 tablet by mouth daily.     12/23/2018 at Unknown time  . OMEPRAZOLE PO Take 20 mg by mouth every evening.    Past Week at Unknown time  . PATADAY 0.2 % SOLN Place 1 drop into both eyes daily.   4 12/23/2018 at Unknown time  . potassium chloride (K-DUR) 10 MEQ tablet TAKE 1 TABLET BY MOUTH DAILY. (Patient taking differently: Take 10 mEq by mouth every morning. ) 90 tablet 3 12/23/2018 at Unknown time  . triamterene-hydrochlorothiazide (MAXZIDE) 75-50 MG tablet Take 1 tablet by mouth daily.    12/23/2018 at Unknown time    Assessment: 73 y.o. female with h/o Afib, Eliquis on hold for diverticular abcess/drain placement.   PTA Eliquis 5mg  BID; last dose 7/17 @ 0900  aPTT today is 80 which is therapeutic. Will continue to rely on aPTT values until heparin levels begin to correlate with the aPTT. They are falsely high right now since the patient was on Eliquis  PTA.  H&H and platelets WNL; no signs of bleeding  Goal of Therapy:  APTT 66-102 Heparin level 0.3-0.7 units/ml Monitor platelets by anticoagulation protocol: Yes   Plan:  -Continue heparin drip at 1400 units/hr -Confirmatory aPTT in 6 hours -Monitor daily aPTT, heparin level, and CBC -Monitor for signs and symptoms of bleeding -F/u plan to hold heparin for drain placement -F/u plan to switch back to oral anticoagulation  Kennon Holter, PharmD PGY1 Ambulatory Care Pharmacy Resident [employer] Phone: [phone] 12/24/2018,1:43 PM

## 2018-12-24 NOTE — Progress Notes (Signed)
ANTICOAGULATION CONSULT NOTE - Initial Consult  Pharmacy Consult for Heparin Indication: atrial fibrillation  Allergies  Allergen Reactions  . Atenolol Cough  . Erythromycin     Nausea  . Erythromycin Base Nausea Only  . Lactose Intolerance (Gi) Diarrhea    Just MILK   . Plavix [Clopidogrel Bisulfate]     cough  . Sulfa Antibiotics Other (See Comments)    Turned eyes yellow   . Sulfonamide Derivatives     Rash    Patient Measurements:   Heparin Dosing Weight: 90 kg  Vital Signs: Temp: 98.7 F (37.1 C) (07/18 0111) Temp Source: Oral (07/18 0111) BP: 109/93 (07/18 0111) Pulse Rate: 81 (07/18 0111)  Labs: Recent Labs    12/23/18 1627 12/24/18 0211  HGB 14.2 14.2  HCT 43.4 43.4  PLT 333 243  CREATININE 1.14*  --     CrCl cannot be calculated (Unknown ideal weight.).   Medical History: Past Medical History:  Diagnosis Date  . Abnormal Pap smear    Rare ASCUS-H  . Arthritis   . Atrial fibrillation ([place])   . Cancer (HCC)    melanoma rt leg  . Chest pain   . Diabetes mellitus without complication ([place])   . HTN (hypertension) 07/02/2015  . Hypercholesteremia   . Hyperglycemia 02/04/2011  . Hyperlipidemia 02/04/2011  . Hypertension    per pt no htn but needs meds for other reason  . Hypokalemia 05/17/2017  . Injury of right popliteal artery 05/17/2017  . Knee dislocation 05/17/2017  . Leg edema 02/04/2011  . Leg swelling 02/02/2011  . Leukocytosis 05/17/2017  . Obesity   . PAF (paroxysmal atrial fibrillation) ([place]) 02/02/2011  . Popliteal artery injury 05/17/2017    Medications:  Medications Prior to Admission  Medication Sig Dispense Refill Last Dose  . aspirin EC 81 MG tablet Take 81 mg by mouth See admin instructions. Lydia Guiles, thur, sat   12/22/2018 at 2115  . B Complex Vitamins (B COMPLEX PO) Take 1 tablet by mouth every evening.    Past Week at Unknown time  . Calcium Carbonate-Vit D-Min (GNP CALCIUM PLUS 600 +D PO) Take 1 tablet by mouth every  other day.    Past Month at Unknown time  . ciprofloxacin (CIPRO) 500 MG tablet Take 500 mg by mouth every 12 (twelve) hours.   12/23/2018 at 0900  . diltiazem (CARDIZEM CD) 240 MG 24 hr capsule TAKE 1 CAPSULE BY MOUTH DAILY. (Patient taking differently: Take 240 mg by mouth daily. ) 90 capsule 2 12/23/2018 at Unknown time  . ELIQUIS 5 MG TABS tablet TAKE 1 TABLET BY MOUTH 2 TIMES DAILY. (Patient taking differently: Take 5 mg by mouth 2 (two) times daily. ) 60 tablet 9 12/23/2018 at 0900  . loratadine (CLARITIN) 10 MG tablet Take 10 mg by mouth daily.   12/23/2018 at Unknown time  . metroNIDAZOLE (FLAGYL) 500 MG tablet Take 500 mg by mouth 3 (three) times daily.   12/23/2018 at 1500  . mometasone (NASONEX) 50 MCG/ACT nasal spray Place 1 spray into the nose daily as needed for congestion.  0 Past Month at Unknown time  . multivitamin (THERAGRAN) per tablet Take 1 tablet by mouth daily.     12/23/2018 at Unknown time  . OMEPRAZOLE PO Take 20 mg by mouth every evening.    Past Week at Unknown time  . PATADAY 0.2 % SOLN Place 1 drop into both eyes daily.   4 12/23/2018 at Unknown time  . potassium chloride (  K-DUR) 10 MEQ tablet TAKE 1 TABLET BY MOUTH DAILY. (Patient taking differently: Take 10 mEq by mouth every morning. ) 90 tablet 3 12/23/2018 at Unknown time  . triamterene-hydrochlorothiazide (MAXZIDE) 75-50 MG tablet Take 1 tablet by mouth daily.    12/23/2018 at Unknown time    Assessment: 73 y.o. female with h/o Afib, Eliquis on hold for diverticular abcess/drain placement, for heparin  Goal of Therapy:  APTT 66-102 Heparin level 0.3-0.7 units/ml Monitor platelets by anticoagulation protocol: Yes   Plan:  Start heparin 1400 units/hr APTT in 8 hours  Garren Greenman, Bronson Curb 12/24/2018,2:29 AM

## 2018-12-24 NOTE — Progress Notes (Signed)
Chief Complaint: Post/op fever, lower abdominal pain    Pertinent Hx: A-fib, Diabetes mellitus, HTN, hypercholesteremia  Living Situation: from home with spouse  IV/Fluids/Abx: RAC/ D5NS @ 21ml/hr  Void: bathroom privileges  LBM: 12/23/18  VTE: Heparin drip 65ml/hr  Diet: NPO  Mobility: up as tolerated   Assistive Devices: none   Tele: Box 10  DM/CBG: none   Skin: WDL  Issues to Address: none, denies pain, no complaints  Consults needed/recommended: none

## 2018-12-24 NOTE — Progress Notes (Signed)
Patient ID: [name], female   DOB: [birth year], 73 y.o.   MRN: [id number]   Request for diverticular abscess drain placement  Reviewed imaging with Dr Vernard Gambles-- Too small to place drain  Rec: treat clinically If symptoms worsen or status changes-- Re Ct for re evaluation of collection  Will discuss with Dr Starla Link

## 2018-12-24 NOTE — H&P (Signed)
History and Physical    [name] WUJ:[id number] DOB: 1945/07/23 DOA: 12/23/2018  PCP: Leonard Downing, MD  Patient coming from: Home.  Chief Complaint: Fever abdominal soreness.  HPI: [name] is a 73 y.o. female with history of atrial fibrillation, hypertension, history of popliteal artery dissection status post surgery presents to the ER with complaints of having abdominal soreness and fever.  Patient was admitted on December 10, 2018 for 8 days at [facility] for diverticulitis with possible microperforation patient was on antibiotics and did well and was discharged around 5 days ago.  Patient states he was eating well but started developing some soreness and fever chills 3 days after discharge and since it persisted was instructed to come to the ER.  ED Course: In the ER patient was mildly febrile with labs showing WBC count of 16.3 creatinine 1.1 potassium 3.2 and CT scan showing acute sigmoid diverticulitis with possible developing abscess for which general surgery was consulted and Dr. Dema Severin general surgeon requested IR consult.  On exam patient mildly tender on the left lower quadrant.  Denies any urinary symptoms.  UA is unremarkable.  Review of Systems: As per HPI, rest all negative.   Past Medical History:  Diagnosis Date   Abnormal Pap smear    Rare ASCUS-H   Arthritis    Atrial fibrillation ([place])    Cancer ([place])    melanoma rt leg   Chest pain    Diabetes mellitus without complication (HCC)    HTN (hypertension) 07/02/2015   Hypercholesteremia    Hyperglycemia 02/04/2011   Hyperlipidemia 02/04/2011   Hypertension    per pt no htn but needs meds for other reason   Hypokalemia 05/17/2017   Injury of right popliteal artery 05/17/2017   Knee dislocation 05/17/2017   Leg edema 02/04/2011   Leg swelling 02/02/2011   Leukocytosis 05/17/2017   Obesity    PAF (paroxysmal atrial fibrillation) ([place]) 02/02/2011   Popliteal  artery injury 05/17/2017    Past Surgical History:  Procedure Laterality Date   BYPASS GRAFT POPLITEAL TO POPLITEAL Right 05/17/2017   Procedure: BYPASS GRAFT ABOVE KNEE POPLITEAL TO BELOW KNEE POPLITEAL USING NONREVERSED RIGHT GREATER SAPHENOUS VEIN;  Surgeon: Waynetta Sandy, MD;  Location: [facility];  Service: Vascular;  Laterality: Right;   CARDIAC CATHETERIZATION  07/11/2010   Est. EF of 60-65% -- Smooth and normal coronary arteries  -- Normal LV systolic function    CATARACT EXTRACTION, BILATERAL     08-12-17, 08-26-17   CHOLECYSTECTOMY     CYSTOCELE REPAIR  07/22/2010   Vault suspension, cystocele repair, graft and cystoscopy   FINGER SURGERY     MELANOMA EXCISION     right leg 3/12   NASAL SINUS SURGERY     NEUROMA SURGERY     rt foot   TONSILLECTOMY     TOTAL KNEE ARTHROPLASTY  06/20/2009   left knee   TOTAL KNEE ARTHROPLASTY  12/27/2008   right knee   TOTAL VAGINAL HYSTERECTOMY  07/22/2010   with bilateral salpingo-oophorectomy by Dr. Joan Flores   VEIN HARVEST Right 05/17/2017   Procedure: [place];  Surgeon: Waynetta Sandy, MD;  Location: [facility];  Service: Vascular;  Laterality: Right;     reports that she has never smoked. She has never used smokeless tobacco. She reports that she does not drink alcohol or use drugs.  Allergies  Allergen Reactions   Atenolol Cough   Erythromycin  Nausea   Erythromycin Base Nausea Only   Lactose Intolerance (Gi) Diarrhea    Just MILK    Plavix [Clopidogrel Bisulfate]     cough   Sulfa Antibiotics Other (See Comments)    Turned eyes yellow    Sulfonamide Derivatives     Rash    Family History  Problem Relation Age of Onset   Emphysema Father    Atrial fibrillation Mother        has pacemaker   Diabetes Mother    Hypertension Mother    Other Brother        bulbar palsy    Prior to Admission medications   Medication Sig Start Date End Date Taking?  Authorizing Provider  aspirin EC 81 MG tablet Take 81 mg by mouth See admin instructions. Lydia Guiles, thur, sat   Yes [provider]  B Complex Vitamins (B COMPLEX PO) Take 1 tablet by mouth every evening.    Yes [provider]  Calcium Carbonate-Vit D-Min (GNP CALCIUM PLUS 600 +D PO) Take 1 tablet by mouth every other day.    Yes [provider]  ciprofloxacin (CIPRO) 500 MG tablet Take 500 mg by mouth every 12 (twelve) hours. 12/18/18  Yes [provider]  diltiazem (CARDIZEM CD) 240 MG 24 hr capsule TAKE 1 CAPSULE BY MOUTH DAILY. Patient taking differently: Take 240 mg by mouth daily.  10/07/18  Yes Nahser, Wonda Cheng, MD  ELIQUIS 5 MG TABS tablet TAKE 1 TABLET BY MOUTH 2 TIMES DAILY. Patient taking differently: Take 5 mg by mouth 2 (two) times daily.  05/19/18  Yes Nahser, Wonda Cheng, MD  loratadine (CLARITIN) 10 MG tablet Take 10 mg by mouth daily.   Yes [provider]  metroNIDAZOLE (FLAGYL) 500 MG tablet Take 500 mg by mouth 3 (three) times daily. 12/18/18  Yes [provider]  mometasone (NASONEX) 50 MCG/ACT nasal spray Place 1 spray into the nose daily as needed for congestion. 01/05/17  Yes [provider]  multivitamin (THERAGRAN) per tablet Take 1 tablet by mouth daily.     Yes [provider]  OMEPRAZOLE PO Take 20 mg by mouth every evening.    Yes [provider]  PATADAY 0.2 % SOLN Place 1 drop into both eyes daily.  04/27/16  Yes [provider]  potassium chloride (K-DUR) 10 MEQ tablet TAKE 1 TABLET BY MOUTH DAILY. Patient taking differently: Take 10 mEq by mouth every morning.  03/10/18  Yes Nahser, Wonda Cheng, MD  triamterene-hydrochlorothiazide (MAXZIDE) 75-50 MG tablet Take 1 tablet by mouth daily.    Yes [provider]    Physical Exam: Constitutional: Moderately built and nourished. Vitals:   12/23/18 2130 12/23/18 2145 12/24/18 0021 12/24/18 0111  BP: 123/73 133/73 123/62 (!)  109/93  Pulse: 74 73 71 81  Resp:  16 16 18   Temp:    98.7 F (37.1 C)  TempSrc:    Oral  SpO2: 93% 95% 99% 98%   Eyes: Anicteric no pallor. ENMT: No discharge from the ears eyes nose or mouth. Neck: No mass felt.  No neck rigidity. Respiratory: No rhonchi or crepitations. Cardiovascular: S1-S2 heard. Abdomen: Mild tenderness in the left lower: No guarding or rigidity. Musculoskeletal: No edema. Skin: No rash. Neurologic: Alert awake oriented to time place and person.  Moves all extremities. Psychiatric: Appears normal per normal affect.   Labs on Admission: I have personally reviewed following labs and imaging studies  CBC: Recent Labs  Lab 12/23/18  1627  WBC 16.3*  HGB 14.2  HCT 43.4  MCV 93.9  PLT 845   Basic Metabolic Panel: Recent Labs  Lab 12/23/18 1627  NA 139  K 3.2*  CL 103  CO2 26  GLUCOSE 132*  BUN 21  CREATININE 1.14*  CALCIUM 9.7   GFR: CrCl cannot be calculated (Unknown ideal weight.). Liver Function Tests: Recent Labs  Lab 12/23/18 1627  AST 23  ALT 33  ALKPHOS 59  BILITOT 0.6  PROT 6.0*  ALBUMIN 3.2*   Recent Labs  Lab 12/23/18 1627  LIPASE 23   No results for input(s): AMMONIA in the last 168 hours. Coagulation Profile: No results for input(s): INR, PROTIME in the last 168 hours. Cardiac Enzymes: No results for input(s): CKTOTAL, CKMB, CKMBINDEX, TROPONINI in the last 168 hours. BNP (last 3 results) No results for input(s): PROBNP in the last 8760 hours. HbA1C: No results for input(s): HGBA1C in the last 72 hours. CBG: No results for input(s): GLUCAP in the last 168 hours. Lipid Profile: No results for input(s): CHOL, HDL, LDLCALC, TRIG, CHOLHDL, LDLDIRECT in the last 72 hours. Thyroid Function Tests: No results for input(s): TSH, T4TOTAL, FREET4, T3FREE, THYROIDAB in the last 72 hours. Anemia Panel: No results for input(s): VITAMINB12, FOLATE, FERRITIN, TIBC, IRON, RETICCTPCT in the last 72 hours. Urine analysis:      Component Value Date/Time   COLORURINE YELLOW 12/23/2018 1622   APPEARANCEUR CLEAR 12/23/2018 1622   LABSPEC 1.014 12/23/2018 1622   PHURINE 6.0 12/23/2018 1622   GLUCOSEU NEGATIVE 12/23/2018 1622   HGBUR NEGATIVE 12/23/2018 1622   BILIRUBINUR NEGATIVE 12/23/2018 1622   BILIRUBINUR n 01/25/2013 1113   KETONESUR NEGATIVE 12/23/2018 1622   PROTEINUR NEGATIVE 12/23/2018 1622   UROBILINOGEN negative 01/25/2013 1113   UROBILINOGEN 0.2 06/17/2009 1308   NITRITE NEGATIVE 12/23/2018 1622   LEUKOCYTESUR TRACE (A) 12/23/2018 1622   Sepsis Labs: @LABRCNTIP (procalcitonin:4,lacticidven:4) )No results found for this or any previous visit (from the past 240 hour(s)).   Radiological Exams on Admission: Ct Abdomen Pelvis W Contrast  Result Date: 12/23/2018 CLINICAL DATA:  Prior hospital admission for diverticulitis with colonic rupture. Persistent low-grade fever. EXAM: CT ABDOMEN AND PELVIS WITH CONTRAST TECHNIQUE: Multidetector CT imaging of the abdomen and pelvis was performed using the standard protocol following bolus administration of intravenous contrast. CONTRAST:  135mL OMNIPAQUE IOHEXOL 300 MG/ML  SOLN COMPARISON:  None. FINDINGS: Lower chest: Basilar atelectatic changes and areas of bronchiectasis with architectural distortion. Mild cardiomegaly. No pericardial effusion atherosclerotic calcification of the coronary arteries. Aortic leaflet calcifications are present. Hepatobiliary: No focal liver abnormality is seen. Patient is post cholecystectomy. Slight prominence of the biliary tree likely related to reservoir effect. No calcified intraductal gallstones. Pancreas: Fatty replacement of the pancreas. No pancreatic ductal dilatation or surrounding inflammatory changes. Spleen: Normal in size without focal abnormality. Accessory hilar splenule. Adrenals/Urinary Tract: Simple fluid attenuation cyst in the upper pole left kidney measuring up to 4.9 cm. No concerning renal masses. No hydronephrosis.  Mild asymmetric bladder wall thickening most pronounced along the left superolateral dome, likely reactive. Stomach/Bowel: Small hiatal hernia. Partially calcified ovoid projection from the greater curvature of the stomach. Duodenal sweep is unremarkable. No small bowel thickening or dilatation. No obstruction. High-density material within the tip of the otherwise normal appendix. There is focal segmental thickening and pericolonic inflammation centered upon a diverticular outpouching in the proximal sigmoid colon (coronal 85, axial 80) with adjacent phlegmonous change with a developing air and fluid containing rim enhancing collection along  the superolateral left bladder dome. No resultant obstruction. Vascular/Lymphatic: Atherosclerotic plaque within the normal caliber aorta. Few scattered reactive appearing mesenteric lymph nodes. No pathologically enlarged nodes. Reproductive: Uterus is surgically absent. No concerning adnexal lesions. Other: Rim enhancing collection in the low left pelvis, detailed above. No other foci of free fluid or gas. Pelvic floor laxity. Musculoskeletal: Pelvic floor laxity. No acute osseous abnormality or suspicious osseous lesion. Multilevel degenerative changes are present in the imaged portions of the spine. At least canal stenosis and moderate foraminal stenosis on the right at L3-4. IMPRESSION: Findings consistent with acute sigmoid diverticulitis with a developing air and fluid containing collection along the superolateral left bladder dome, consistent with abscess. No resultant obstruction. Asymmetric bladder thickening is likely reactive though could consider urinalysis if urinary symptoms are present. Partially calcified outpouching from the greater curvature of the stomach. Non-specific but could reflect prior gastric diverticula or remote contain ulcer perforation. Malignant etiology is considered less likely. Hiatal hernia. Aortic Atherosclerosis (ICD10-I70.0). Pelvic floor  laxity. These results were called by telephone at the time of interpretation on 12/23/2018 at 10:46 pm to Dr. Stark Jock, who verbally acknowledged these results. Electronically Signed   By: Lovena Le M.D.   On: 12/23/2018 22:46     Assessment/Plan Principal Problem:   Colonic diverticular abscess Active Problems:   PAF (paroxysmal atrial fibrillation) (HCC)   HTN (hypertension)    1. Acute sigmoid diverticulitis with abscess -appreciate general surgery consult.  Will consult interventional radiology for drain placement.  On empiric antibiotics IV fluids n.p.o. except medication. 2. Atrial fibrillation -since patient is in anticipation of procedure we will hold Eliquis and keep patient on heparin.  Cardizem CD p.o for rate control. 3. Hypertension on as needed IV hydralazine since patient is n.p.o. 4. Acute renal failure with hypokalemia could be from some loose stools patient start developing.  Gently hydrate replace potassium follow metabolic panel. 5. History of right popliteal artery dissection status post bypass. 6. Obesity.  Given the complexity of patient's condition including sigmoid diverticulitis with abscess formation which would require drain placement and close monitoring for any worsening of pain or symptoms pending inpatient status.   DVT prophylaxis: Heparin infusion. Code Status: Full code. Family Communication: Discussed with patient. Disposition Plan: Home. Consults called: General surgery interventional radiology. Admission status: Inpatient.   Rise Patience MD [facility] Pager [phone].  If 7PM-7AM, please contact night-coverage [email or web address] Password [email or web address]  12/24/2018, 1:44 AM

## 2018-12-24 NOTE — Progress Notes (Signed)
Brief Pharmacy Note:  33 y/oF on IV heparin while Apixaban for a-fib on hold for potential procedure.    PM aPTT = 76 seconds, remains therapeutic CBC: H/H, Pltc WNL No bleeding or infusion issues noted per nursing   Plan: Continue IV heparin at 1400 units/hr Daily heparin level, aPTT, CBC Monitor closely for s/sx of bleeding   Lindell Spar, PharmD, BCPS Clinical Pharmacist  12/24/2018 7:38 PM

## 2018-12-24 NOTE — Progress Notes (Signed)
Patient ID: [name], female   DOB: Mar 12, 1946, 73 y.o.   MRN: [id number] [facility] Progress Note:   * No surgery found *  Subjective: Mental status is clear; talkative Objective: Vital signs in last 24 hours: Temp:  [98.2 F (36.8 C)-99.8 F (37.7 C)] 98.9 F (37.2 C) (07/18 0931) Pulse Rate:  [71-81] 72 (07/18 0931) Resp:  [16-18] 18 (07/18 0931) BP: (109-143)/(62-109) 135/76 (07/18 0931) SpO2:  [90 %-100 %] 90 % (07/18 0931)  Intake/Output from previous day: 07/17 0701 - 07/18 0700 In: 1413.2 [I.V.:213.2; IV Piggyback:1200] Out: 0  Intake/Output this shift: Total I/O In: 409 [I.V.:202.8; IV Piggyback:206.2] Out: 450 [Urine:450]  Physical Exam: Work of breathing is not labored.  Pain is better but she relapsed from her treatment at [facility].    Lab Results:  Results for orders placed or performed during the hospital encounter of 12/23/18 (from the past 48 hour(s))  Urinalysis, Routine w reflex microscopic     Status: Abnormal   Collection Time: 12/23/18  4:22 PM  Result Value Ref Range   Color, Urine YELLOW YELLOW   APPearance CLEAR CLEAR   Specific Gravity, Urine 1.014 1.005 - 1.030   pH 6.0 5.0 - 8.0   Glucose, UA NEGATIVE NEGATIVE mg/dL   Hgb urine dipstick NEGATIVE NEGATIVE   Bilirubin Urine NEGATIVE NEGATIVE   Ketones, ur NEGATIVE NEGATIVE mg/dL   Protein, ur NEGATIVE NEGATIVE mg/dL   Nitrite NEGATIVE NEGATIVE   Leukocytes,Ua TRACE (A) NEGATIVE   RBC / HPF 0-5 0 - 5 RBC/hpf   WBC, UA 0-5 0 - 5 WBC/hpf   Bacteria, UA NONE SEEN NONE SEEN   Squamous Epithelial / LPF 0-5 0 - 5    Comment: Performed at [facility], [place] [address], [place], [place] [address]  Lipase, blood     Status: None   Collection Time: 12/23/18  4:27 PM  Result Value Ref Range   Lipase 23 11 - 51 U/L    Comment: Performed at [facility] [address], [place], [place] [address]  Comprehensive metabolic panel     Status: Abnormal   Collection  Time: 12/23/18  4:27 PM  Result Value Ref Range   Sodium 139 135 - 145 mmol/L   Potassium 3.2 (L) 3.5 - 5.1 mmol/L   Chloride 103 98 - 111 mmol/L   CO2 26 22 - 32 mmol/L   Glucose, Bld 132 (H) 70 - 99 mg/dL   BUN 21 8 - 23 mg/dL   Creatinine, Ser 1.14 (H) 0.44 - 1.00 mg/dL   Calcium 9.7 8.9 - 10.3 mg/dL   Total Protein 6.0 (L) 6.5 - 8.1 g/dL   Albumin 3.2 (L) 3.5 - 5.0 g/dL   AST 23 15 - 41 U/L   ALT 33 0 - 44 U/L   Alkaline Phosphatase 59 38 - 126 U/L   Total Bilirubin 0.6 0.3 - 1.2 mg/dL   GFR calc non Af Amer 48 (L) >60 mL/min   GFR calc Af Amer 56 (L) >60 mL/min   Anion gap 10 5 - 15    Comment: Performed at [facility], [place] [address], [place] [address]  CBC     Status: Abnormal   Collection Time: 12/23/18  4:27 PM  Result Value Ref Range   WBC 16.3 (H) 4.0 - 10.5 K/uL   RBC 4.62 3.87 - 5.11 MIL/uL   Hemoglobin 14.2 12.0 - 15.0 g/dL   HCT 43.4  36.0 - 46.0 %   MCV 93.9 80.0 - 100.0 fL   MCH 30.7 26.0 - 34.0 pg   MCHC 32.7 30.0 - 36.0 g/dL   RDW 13.8 11.5 - 15.5 %   Platelets 333 150 - 400 K/uL   nRBC 0.0 0.0 - 0.2 %    Comment: Performed at [facility], [place] [address], [place], [place] [address]  SARS Coronavirus 2 (CEPHEID - Performed in [facility] lab), Hosp Order     Status: None   Collection Time: 12/24/18 12:20 AM   Specimen: Nasopharyngeal Swab  Result Value Ref Range   SARS Coronavirus 2 NEGATIVE NEGATIVE    Comment: (NOTE) If result is NEGATIVE SARS-CoV-2 target nucleic acids are NOT DETECTED. The SARS-CoV-2 RNA is generally detectable in upper and lower  respiratory specimens during the acute phase of infection. The lowest  concentration of SARS-CoV-2 viral copies this assay can detect is 250  copies / mL. A negative result does not preclude SARS-CoV-2 infection  and should not be used as the sole basis for treatment or other  patient management decisions.  A negative result may occur with  improper specimen collection /  handling, submission of specimen other  than nasopharyngeal swab, presence of viral mutation(s) within the  areas targeted by this assay, and inadequate number of viral copies  (<250 copies / mL). A negative result must be combined with clinical  observations, patient history, and epidemiological information. If result is POSITIVE SARS-CoV-2 target nucleic acids are DETECTED. The SARS-CoV-2 RNA is generally detectable in upper and lower  respiratory specimens dur ing the acute phase of infection.  Positive  results are indicative of active infection with SARS-CoV-2.  Clinical  correlation with patient history and other diagnostic information is  necessary to determine patient infection status.  Positive results do  not rule out bacterial infection or co-infection with other viruses. If result is PRESUMPTIVE POSTIVE SARS-CoV-2 nucleic acids MAY BE PRESENT.   A presumptive positive result was obtained on the submitted specimen  and confirmed on repeat testing.  While 2019 novel coronavirus  (SARS-CoV-2) nucleic acids may be present in the submitted sample  additional confirmatory testing may be necessary for epidemiological  and / or clinical management purposes  to differentiate between  SARS-CoV-2 and other Sarbecovirus currently known to infect humans.  If clinically indicated additional testing with an alternate test  methodology [phone]) is advised. The SARS-CoV-2 RNA is generally  detectable in upper and lower respiratory sp ecimens during the acute  phase of infection. The expected result is Negative. Fact Sheet for Patients:  StrictlyIdeas.no Fact Sheet for Healthcare Providers: BankingDealers.co.za This test is not yet approved or cleared by the [place] FDA and has been authorized for detection and/or diagnosis of SARS-CoV-2 by FDA under an Emergency Use Authorization (EUA).  This EUA will remain in effect (meaning this  test can be used) for the duration of the COVID-19 declaration under Section 564(b)(1) of the Act, 21 U.S.C. section 360bbb-3(b)(1), unless the authorization is terminated or revoked sooner. Performed at [facility], [place] [address] [place], [place] [address]   Comprehensive metabolic panel     Status: Abnormal   Collection Time: 12/24/18  2:11 AM  Result Value Ref Range   Sodium 138 135 - 145 mmol/L   Potassium 3.1 (L) 3.5 - 5.1 mmol/L   Chloride 106 98 - 111 mmol/L   CO2 20 (L) 22 - 32 mmol/L   Glucose, Bld  112 (H) 70 - 99 mg/dL   BUN 18 8 - 23 mg/dL   Creatinine, Ser 1.02 (H) 0.44 - 1.00 mg/dL   Calcium 9.1 8.9 - 10.3 mg/dL   Total Protein 5.5 (L) 6.5 - 8.1 g/dL   Albumin 3.0 (L) 3.5 - 5.0 g/dL   AST 22 15 - 41 U/L   ALT 27 0 - 44 U/L   Alkaline Phosphatase 54 38 - 126 U/L   Total Bilirubin 0.6 0.3 - 1.2 mg/dL   GFR calc non Af Amer 55 (L) >60 mL/min   GFR calc Af Amer >60 >60 mL/min   Anion gap 12 5 - 15    Comment: Performed at [facility] [address], [place], [place] [address]  CBC WITH DIFFERENTIAL     Status: Abnormal   Collection Time: 12/24/18  2:11 AM  Result Value Ref Range   WBC 11.3 (H) 4.0 - 10.5 K/uL   RBC 4.68 3.87 - 5.11 MIL/uL   Hemoglobin 14.2 12.0 - 15.0 g/dL   HCT 43.4 36.0 - 46.0 %   MCV 92.7 80.0 - 100.0 fL   MCH 30.3 26.0 - 34.0 pg   MCHC 32.7 30.0 - 36.0 g/dL   RDW 14.0 11.5 - 15.5 %   Platelets 243 150 - 400 K/uL   nRBC 0.0 0.0 - 0.2 %   Neutrophils Relative % 71 %   Neutro Abs 8.0 (H) 1.7 - 7.7 K/uL   Lymphocytes Relative 17 %   Lymphs Abs 1.9 0.7 - 4.0 K/uL   Monocytes Relative 9 %   Monocytes Absolute 1.1 (H) 0.1 - 1.0 K/uL   Eosinophils Relative 2 %   Eosinophils Absolute 0.2 0.0 - 0.5 K/uL   Basophils Relative 0 %   Basophils Absolute 0.0 0.0 - 0.1 K/uL   Immature Granulocytes 1 %   Abs Immature Granulocytes 0.07 0.00 - 0.07 K/uL    Comment: Performed at [facility], [address] [place], [place]  [address]  Heparin level (unfractionated)     Status: Abnormal   Collection Time: 12/24/18  2:11 AM  Result Value Ref Range   Heparin Unfractionated >2.20 (H) 0.30 - 0.70 IU/mL    Comment: RESULTS CONFIRMED BY MANUAL DILUTION (NOTE) If heparin results are below expected values, and patient dosage has  been confirmed, suggest follow up testing of antithrombin III levels. Performed at [facility], [place] [address] [place], [place] [address]   Protime-INR     Status: Abnormal   Collection Time: 12/24/18  5:45 AM  Result Value Ref Range   Prothrombin Time 16.4 (H) 11.4 - 15.2 seconds   INR 1.3 (H) 0.8 - 1.2    Comment: (NOTE) INR goal varies based on device and disease states. Performed at [facility], [place] [address], [place], [place] [address]   APTT     Status: Abnormal   Collection Time: 12/24/18  5:45 AM  Result Value Ref Range   aPTT 56 (H) 24 - 36 seconds    Comment:        IF BASELINE aPTT IS ELEVATED, SUGGEST PATIENT RISK ASSESSMENT BE USED TO DETERMINE APPROPRIATE ANTICOAGULANT THERAPY. Performed at [facility], [place] [address], [place], [place] [address]   Glucose, capillary     Status: Abnormal   Collection Time: 12/24/18  7:08 AM  Result Value Ref Range   Glucose-Capillary 122 (H) 70 - 99 mg/dL    Radiology/Results: Ct Abdomen  Pelvis W Contrast  Result Date: 12/23/2018 CLINICAL DATA:  Prior hospital admission for diverticulitis with colonic rupture. Persistent low-grade fever. EXAM: CT ABDOMEN AND PELVIS WITH CONTRAST TECHNIQUE: Multidetector CT imaging of the abdomen and pelvis was performed using the standard protocol following bolus administration of intravenous contrast. CONTRAST:  11mL OMNIPAQUE IOHEXOL 300 MG/ML  SOLN COMPARISON:  None. FINDINGS: Lower chest: Basilar atelectatic changes and areas of bronchiectasis with architectural distortion. Mild cardiomegaly. No pericardial effusion atherosclerotic calcification of the coronary arteries.  Aortic leaflet calcifications are present. Hepatobiliary: No focal liver abnormality is seen. Patient is post cholecystectomy. Slight prominence of the biliary tree likely related to reservoir effect. No calcified intraductal gallstones. Pancreas: Fatty replacement of the pancreas. No pancreatic ductal dilatation or surrounding inflammatory changes. Spleen: Normal in size without focal abnormality. Accessory hilar splenule. Adrenals/Urinary Tract: Simple fluid attenuation cyst in the upper pole left kidney measuring up to 4.9 cm. No concerning renal masses. No hydronephrosis. Mild asymmetric bladder wall thickening most pronounced along the left superolateral dome, likely reactive. Stomach/Bowel: Small hiatal hernia. Partially calcified ovoid projection from the greater curvature of the stomach. Duodenal sweep is unremarkable. No small bowel thickening or dilatation. No obstruction. High-density material within the tip of the otherwise normal appendix. There is focal segmental thickening and pericolonic inflammation centered upon a diverticular outpouching in the proximal sigmoid colon (coronal 85, axial 80) with adjacent phlegmonous change with a developing air and fluid containing rim enhancing collection along the superolateral left bladder dome. No resultant obstruction. Vascular/Lymphatic: Atherosclerotic plaque within the normal caliber aorta. Few scattered reactive appearing mesenteric lymph nodes. No pathologically enlarged nodes. Reproductive: Uterus is surgically absent. No concerning adnexal lesions. Other: Rim enhancing collection in the low left pelvis, detailed above. No other foci of free fluid or gas. Pelvic floor laxity. Musculoskeletal: Pelvic floor laxity. No acute osseous abnormality or suspicious osseous lesion. Multilevel degenerative changes are present in the imaged portions of the spine. At least canal stenosis and moderate foraminal stenosis on the right at L3-4. IMPRESSION: Findings  consistent with acute sigmoid diverticulitis with a developing air and fluid containing collection along the superolateral left bladder dome, consistent with abscess. No resultant obstruction. Asymmetric bladder thickening is likely reactive though could consider urinalysis if urinary symptoms are present. Partially calcified outpouching from the greater curvature of the stomach. Non-specific but could reflect prior gastric diverticula or remote contain ulcer perforation. Malignant etiology is considered less likely. Hiatal hernia. Aortic Atherosclerosis (ICD10-I70.0). Pelvic floor laxity. These results were called by telephone at the time of interpretation on 12/23/2018 at 10:46 pm to Dr. Stark Jock, who verbally acknowledged these results. Electronically Signed   By: Lovena Le M.D.   On: 12/23/2018 22:46    Anti-infectives: Anti-infectives (From admission, onward)   Start     Dose/Rate Route Frequency Ordered Stop   12/24/18 0300  piperacillin-tazobactam (ZOSYN) IVPB 3.375 g     3.375 g 12.5 mL/hr over 240 Minutes Intravenous Every 8 hours 12/24/18 0210     12/23/18 2345  piperacillin-tazobactam (ZOSYN) IVPB 3.375 g     3.375 g 100 mL/hr over 30 Minutes Intravenous  Once 12/23/18 2341 12/24/18 0052      Assessment/Plan: Problem List: Patient Active Problem List   Diagnosis Date Noted  . Colonic diverticular abscess 12/23/2018  . Pain in right foot 02/23/2018  . Pain of left hip joint 08/11/2017  . Stiffness of right knee 07/28/2017  . 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 Trochanteric bursitis of left hip  07/02/2017  . Knee dislocation 05/17/2017  . Popliteal artery injury 05/17/2017  . Hypokalemia 05/17/2017  . Leukocytosis 05/17/2017  . Injury of right popliteal artery 05/17/2017  . HTN (hypertension) 07/02/2015  . Hyperlipidemia 02/04/2011  . Hyperglycemia 02/04/2011  . Leg edema 02/04/2011  . PAF (paroxysmal atrial fibrillation) ([place]) 02/02/2011  . Leg swelling 02/02/2011     Will see Dr. Ninfa Linden or [facility] yesterday and can arrange followup p hospitalization * No surgery found *    LOS: 1 day   Matt B. Hassell Done, MD, [facility] [phone] beeper [phone]  12/24/2018 11:07 AM

## 2018-12-24 NOTE — Progress Notes (Signed)
Patient ID: [name], female   DOB: March 19, 1946, 73 y.o.   MRN: [id number] 73 year old female was admitted this morning for fever and abdominal soreness.  Found to have acute sigmoid diverticulitis with possible developing abscess.  Started on antibiotics.  General surgery following.  Patient seen and examined at bedside.  Plan of care discussed with her.  I have reviewed patient's medical records including this morning's H&P myself.  I have reviewed current labs and medications as well.  Continue antibiotics.  Follow cultures.  Follow further recommendations from general surgery.

## 2018-12-24 NOTE — Consult Note (Addendum)
CC: Consult by ED-P Ward for diverticulitis with abscess  HPI: [name] is an 73 y.o. female with hx of obesity, HTN, HLD, DM, afib (on Eliquis) whom was admitted to the hospital in [place] for 8 days, discharged 7/12 - for admission for diverticulitis and what she describes as a "microperforation." She reports being on antibiotics and improving - walking 1 mile per day on discharge on the hospital ward. Over the last couple of days has had ongoing LLQ "soreness." She brought this up with her PCP whom suggested she be checked out. She reports tmax of 99.6 at home. Denies n/v. Moving bowels.  Reports 2-3 prior attacks in the last 1-2 years but managed as an outpt with PO abx. Reports she sees Dr. Alessandra Bevels with [facility] - had a colonoscopy in the last month or two and reports being told she had diverticulosis but nothing else. Also had scope 2015 with similar findings.  FHx: Denies FHx of malignancy  PSH: 1. Vaginal hyst + bladder suspension 2. Laparoscopic cholecystectomy  Past Medical History:  Diagnosis Date   Abnormal Pap smear    Rare ASCUS-H   Arthritis    Atrial fibrillation ([place])    Cancer (HCC)    melanoma rt leg   Chest pain    Diabetes mellitus without complication (HCC)    HTN (hypertension) 07/02/2015   Hypercholesteremia    Hyperglycemia 02/04/2011   Hyperlipidemia 02/04/2011   Hypertension    per pt no htn but needs meds for other reason   Hypokalemia 05/17/2017   Injury of right popliteal artery 05/17/2017   Knee dislocation 05/17/2017   Leg edema 02/04/2011   Leg swelling 02/02/2011   Leukocytosis 05/17/2017   Obesity    PAF (paroxysmal atrial fibrillation) ([place]) 02/02/2011   Popliteal artery injury 05/17/2017    Past Surgical History:  Procedure Laterality Date   BYPASS GRAFT POPLITEAL TO POPLITEAL Right 05/17/2017   Procedure: BYPASS GRAFT ABOVE KNEE POPLITEAL TO BELOW KNEE POPLITEAL USING NONREVERSED RIGHT GREATER  SAPHENOUS VEIN;  Surgeon: Waynetta Sandy, MD;  Location: [facility];  Service: Vascular;  Laterality: Right;   CARDIAC CATHETERIZATION  07/11/2010   Est. EF of 60-65% -- Smooth and normal coronary arteries  -- Normal LV systolic function    CATARACT EXTRACTION, BILATERAL     08-12-17, 08-26-17   CHOLECYSTECTOMY     CYSTOCELE REPAIR  07/22/2010   Vault suspension, cystocele repair, graft and cystoscopy   FINGER SURGERY     MELANOMA EXCISION     right leg 3/12   NASAL SINUS SURGERY     NEUROMA SURGERY     rt foot   TONSILLECTOMY     TOTAL KNEE ARTHROPLASTY  06/20/2009   left knee   TOTAL KNEE ARTHROPLASTY  12/27/2008   right knee   TOTAL VAGINAL HYSTERECTOMY  07/22/2010   with bilateral salpingo-oophorectomy by Dr. Joan Flores   VEIN HARVEST Right 05/17/2017   Procedure: [place];  Surgeon: Waynetta Sandy, MD;  Location: [facility];  Service: Vascular;  Laterality: Right;    Family History  Problem Relation Age of Onset   Emphysema Father    Atrial fibrillation Mother        has pacemaker   Diabetes Mother    Hypertension Mother    Other Brother        bulbar palsy    Social:  reports that she has never smoked. She has never used smokeless tobacco. She reports that  she does not drink alcohol or use drugs.  Allergies:  Allergies  Allergen Reactions   Atenolol    Erythromycin     Nausea   Erythromycin Base    Plavix [Clopidogrel Bisulfate]     cough   Sulfa Antibiotics    Sulfonamide Derivatives     Rash    Medications: I have reviewed the patient's current medications.  Results for orders placed or performed during the hospital encounter of 12/23/18 (from the past 48 hour(s))  Urinalysis, Routine w reflex microscopic     Status: Abnormal   Collection Time: 12/23/18  4:22 PM  Result Value Ref Range   Color, Urine YELLOW YELLOW   APPearance CLEAR CLEAR   Specific Gravity, Urine 1.014 1.005 - 1.030   pH 6.0  5.0 - 8.0   Glucose, UA NEGATIVE NEGATIVE mg/dL   Hgb urine dipstick NEGATIVE NEGATIVE   Bilirubin Urine NEGATIVE NEGATIVE   Ketones, ur NEGATIVE NEGATIVE mg/dL   Protein, ur NEGATIVE NEGATIVE mg/dL   Nitrite NEGATIVE NEGATIVE   Leukocytes,Ua TRACE (A) NEGATIVE   RBC / HPF 0-5 0 - 5 RBC/hpf   WBC, UA 0-5 0 - 5 WBC/hpf   Bacteria, UA NONE SEEN NONE SEEN   Squamous Epithelial / LPF 0-5 0 - 5    Comment: Performed at [facility], [place] [address] [place], [place] [address]  Lipase, blood     Status: None   Collection Time: 12/23/18  4:27 PM  Result Value Ref Range   Lipase 23 11 - 51 U/L    Comment: Performed at [facility] [address], [place], [place] [address]  Comprehensive metabolic panel     Status: Abnormal   Collection Time: 12/23/18  4:27 PM  Result Value Ref Range   Sodium 139 135 - 145 mmol/L   Potassium 3.2 (L) 3.5 - 5.1 mmol/L   Chloride 103 98 - 111 mmol/L   CO2 26 22 - 32 mmol/L   Glucose, Bld 132 (H) 70 - 99 mg/dL   BUN 21 8 - 23 mg/dL   Creatinine, Ser 1.14 (H) 0.44 - 1.00 mg/dL   Calcium 9.7 8.9 - 10.3 mg/dL   Total Protein 6.0 (L) 6.5 - 8.1 g/dL   Albumin 3.2 (L) 3.5 - 5.0 g/dL   AST 23 15 - 41 U/L   ALT 33 0 - 44 U/L   Alkaline Phosphatase 59 38 - 126 U/L   Total Bilirubin 0.6 0.3 - 1.2 mg/dL   GFR calc non Af Amer 48 (L) >60 mL/min   GFR calc Af Amer 56 (L) >60 mL/min   Anion gap 10 5 - 15    Comment: Performed at [facility], [place] [address] [place], [place] [address]  CBC     Status: Abnormal   Collection Time: 12/23/18  4:27 PM  Result Value Ref Range   WBC 16.3 (H) 4.0 - 10.5 K/uL   RBC 4.62 3.87 - 5.11 MIL/uL   Hemoglobin 14.2 12.0 - 15.0 g/dL   HCT 43.4 36.0 - 46.0 %   MCV 93.9 80.0 - 100.0 fL   MCH 30.7 26.0 - 34.0 pg   MCHC 32.7 30.0 - 36.0 g/dL   RDW 13.8 11.5 - 15.5 %   Platelets 333 150 - 400 K/uL   nRBC 0.0 0.0 - 0.2 %    Comment: Performed at [facility], [place] [address], [place], [place] [address]     Ct Abdomen  Pelvis W Contrast  Result Date: 12/23/2018 CLINICAL DATA:  Prior hospital admission for diverticulitis with colonic rupture. Persistent low-grade fever. EXAM: CT ABDOMEN AND PELVIS WITH CONTRAST TECHNIQUE: Multidetector CT imaging of the abdomen and pelvis was performed using the standard protocol following bolus administration of intravenous contrast. CONTRAST:  131mL OMNIPAQUE IOHEXOL 300 MG/ML  SOLN COMPARISON:  None. FINDINGS: Lower chest: Basilar atelectatic changes and areas of bronchiectasis with architectural distortion. Mild cardiomegaly. No pericardial effusion atherosclerotic calcification of the coronary arteries. Aortic leaflet calcifications are present. Hepatobiliary: No focal liver abnormality is seen. Patient is post cholecystectomy. Slight prominence of the biliary tree likely related to reservoir effect. No calcified intraductal gallstones. Pancreas: Fatty replacement of the pancreas. No pancreatic ductal dilatation or surrounding inflammatory changes. Spleen: Normal in size without focal abnormality. Accessory hilar splenule. Adrenals/Urinary Tract: Simple fluid attenuation cyst in the upper pole left kidney measuring up to 4.9 cm. No concerning renal masses. No hydronephrosis. Mild asymmetric bladder wall thickening most pronounced along the left superolateral dome, likely reactive. Stomach/Bowel: Small hiatal hernia. Partially calcified ovoid projection from the greater curvature of the stomach. Duodenal sweep is unremarkable. No small bowel thickening or dilatation. No obstruction. High-density material within the tip of the otherwise normal appendix. There is focal segmental thickening and pericolonic inflammation centered upon a diverticular outpouching in the proximal sigmoid colon (coronal 85, axial 80) with adjacent phlegmonous change with a developing air and fluid containing rim enhancing collection along the superolateral left bladder dome. No resultant obstruction.  Vascular/Lymphatic: Atherosclerotic plaque within the normal caliber aorta. Few scattered reactive appearing mesenteric lymph nodes. No pathologically enlarged nodes. Reproductive: Uterus is surgically absent. No concerning adnexal lesions. Other: Rim enhancing collection in the low left pelvis, detailed above. No other foci of free fluid or gas. Pelvic floor laxity. Musculoskeletal: Pelvic floor laxity. No acute osseous abnormality or suspicious osseous lesion. Multilevel degenerative changes are present in the imaged portions of the spine. At least canal stenosis and moderate foraminal stenosis on the right at L3-4. IMPRESSION: Findings consistent with acute sigmoid diverticulitis with a developing air and fluid containing collection along the superolateral left bladder dome, consistent with abscess. No resultant obstruction. Asymmetric bladder thickening is likely reactive though could consider urinalysis if urinary symptoms are present. Partially calcified outpouching from the greater curvature of the stomach. Non-specific but could reflect prior gastric diverticula or remote contain ulcer perforation. Malignant etiology is considered less likely. Hiatal hernia. Aortic Atherosclerosis (ICD10-I70.0). Pelvic floor laxity. These results were called by telephone at the time of interpretation on 12/23/2018 at 10:46 pm to Dr. Stark Jock, who verbally acknowledged these results. Electronically Signed   By: Lovena Le M.D.   On: 12/23/2018 22:46    ROS - all of the below systems have been reviewed with the patient and positives are indicated with bold text General: chills, fever or night sweats Eyes: blurry vision or double vision ENT: epistaxis or sore throat Allergy/Immunology: itchy/watery eyes or nasal congestion Hematologic/Lymphatic: bleeding problems, blood clots or swollen lymph nodes Endocrine: temperature intolerance or unexpected weight changes Breast: new or changing breast lumps or nipple  discharge Resp: cough, shortness of breath, or wheezing CV: chest pain or dyspnea on exertion GI: as per HPI GU: dysuria, trouble voiding, or hematuria MSK: joint pain or joint stiffness Neuro: TIA or stroke symptoms Derm: pruritus and skin lesion changes Psych: anxiety and depression  PE Blood pressure 133/73, pulse 73, temperature 98.2 F (36.8 C), temperature source Oral, resp. rate 16, last menstrual  period 07/22/2010, SpO2 95 %. Constitutional: NAD; conversant; no deformities. Wearing surgical mask. Eyes: Moist conjunctiva; no lid lag; anicteric; PERRL Neck: Trachea midline; no thyromegaly Lungs: Normal respiratory effort; no tactile fremitus CV: RRR; no palpable thrills; no pitting edema GI: Abd obese, soft, minimally ttp in LLQ; no rebound; no guarding; no palpable hepatosplenomegaly MSK: No deformities; no clubbing/cyanosis Psychiatric: Appropriate affect; alert and oriented x3 Lymphatic: No palpable cervical or axillary lymphadenopathy  Results for orders placed or performed during the hospital encounter of 12/23/18 (from the past 48 hour(s))  Urinalysis, Routine w reflex microscopic     Status: Abnormal   Collection Time: 12/23/18  4:22 PM  Result Value Ref Range   Color, Urine YELLOW YELLOW   APPearance CLEAR CLEAR   Specific Gravity, Urine 1.014 1.005 - 1.030   pH 6.0 5.0 - 8.0   Glucose, UA NEGATIVE NEGATIVE mg/dL   Hgb urine dipstick NEGATIVE NEGATIVE   Bilirubin Urine NEGATIVE NEGATIVE   Ketones, ur NEGATIVE NEGATIVE mg/dL   Protein, ur NEGATIVE NEGATIVE mg/dL   Nitrite NEGATIVE NEGATIVE   Leukocytes,Ua TRACE (A) NEGATIVE   RBC / HPF 0-5 0 - 5 RBC/hpf   WBC, UA 0-5 0 - 5 WBC/hpf   Bacteria, UA NONE SEEN NONE SEEN   Squamous Epithelial / LPF 0-5 0 - 5    Comment: Performed at [facility], [place] [address], [place], [place] [address]  Lipase, blood     Status: None   Collection Time: 12/23/18  4:27 PM  Result Value Ref Range   Lipase 23 11 - 51 U/L     Comment: Performed at [facility] [address], [place], [place] [address]  Comprehensive metabolic panel     Status: Abnormal   Collection Time: 12/23/18  4:27 PM  Result Value Ref Range   Sodium 139 135 - 145 mmol/L   Potassium 3.2 (L) 3.5 - 5.1 mmol/L   Chloride 103 98 - 111 mmol/L   CO2 26 22 - 32 mmol/L   Glucose, Bld 132 (H) 70 - 99 mg/dL   BUN 21 8 - 23 mg/dL   Creatinine, Ser 1.14 (H) 0.44 - 1.00 mg/dL   Calcium 9.7 8.9 - 10.3 mg/dL   Total Protein 6.0 (L) 6.5 - 8.1 g/dL   Albumin 3.2 (L) 3.5 - 5.0 g/dL   AST 23 15 - 41 U/L   ALT 33 0 - 44 U/L   Alkaline Phosphatase 59 38 - 126 U/L   Total Bilirubin 0.6 0.3 - 1.2 mg/dL   GFR calc non Af Amer 48 (L) >60 mL/min   GFR calc Af Amer 56 (L) >60 mL/min   Anion gap 10 5 - 15    Comment: Performed at [facility], [place] [address] [place], [place] [address]  CBC     Status: Abnormal   Collection Time: 12/23/18  4:27 PM  Result Value Ref Range   WBC 16.3 (H) 4.0 - 10.5 K/uL   RBC 4.62 3.87 - 5.11 MIL/uL   Hemoglobin 14.2 12.0 - 15.0 g/dL   HCT 43.4 36.0 - 46.0 %   MCV 93.9 80.0 - 100.0 fL   MCH 30.7 26.0 - 34.0 pg   MCHC 32.7 30.0 - 36.0 g/dL   RDW 13.8 11.5 - 15.5 %   Platelets 333 150 - 400 K/uL   nRBC 0.0 0.0 - 0.2 %    Comment: Performed at [facility], [place],  [place] [address]    Ct Abdomen Pelvis W Contrast  Result Date: 12/23/2018 CLINICAL DATA:  Prior hospital admission for diverticulitis with colonic rupture. Persistent low-grade fever. EXAM: CT ABDOMEN AND PELVIS WITH CONTRAST TECHNIQUE: Multidetector CT imaging of the abdomen and pelvis was performed using the standard protocol following bolus administration of intravenous contrast. CONTRAST:  192mL OMNIPAQUE IOHEXOL 300 MG/ML  SOLN COMPARISON:  None. FINDINGS: Lower chest: Basilar atelectatic changes and areas of bronchiectasis with architectural distortion. Mild cardiomegaly. No pericardial effusion atherosclerotic  calcification of the coronary arteries. Aortic leaflet calcifications are present. Hepatobiliary: No focal liver abnormality is seen. Patient is post cholecystectomy. Slight prominence of the biliary tree likely related to reservoir effect. No calcified intraductal gallstones. Pancreas: Fatty replacement of the pancreas. No pancreatic ductal dilatation or surrounding inflammatory changes. Spleen: Normal in size without focal abnormality. Accessory hilar splenule. Adrenals/Urinary Tract: Simple fluid attenuation cyst in the upper pole left kidney measuring up to 4.9 cm. No concerning renal masses. No hydronephrosis. Mild asymmetric bladder wall thickening most pronounced along the left superolateral dome, likely reactive. Stomach/Bowel: Small hiatal hernia. Partially calcified ovoid projection from the greater curvature of the stomach. Duodenal sweep is unremarkable. No small bowel thickening or dilatation. No obstruction. High-density material within the tip of the otherwise normal appendix. There is focal segmental thickening and pericolonic inflammation centered upon a diverticular outpouching in the proximal sigmoid colon (coronal 85, axial 80) with adjacent phlegmonous change with a developing air and fluid containing rim enhancing collection along the superolateral left bladder dome. No resultant obstruction. Vascular/Lymphatic: Atherosclerotic plaque within the normal caliber aorta. Few scattered reactive appearing mesenteric lymph nodes. No pathologically enlarged nodes. Reproductive: Uterus is surgically absent. No concerning adnexal lesions. Other: Rim enhancing collection in the low left pelvis, detailed above. No other foci of free fluid or gas. Pelvic floor laxity. Musculoskeletal: Pelvic floor laxity. No acute osseous abnormality or suspicious osseous lesion. Multilevel degenerative changes are present in the imaged portions of the spine. At least canal stenosis and moderate foraminal stenosis on the  right at L3-4. IMPRESSION: Findings consistent with acute sigmoid diverticulitis with a developing air and fluid containing collection along the superolateral left bladder dome, consistent with abscess. No resultant obstruction. Asymmetric bladder thickening is likely reactive though could consider urinalysis if urinary symptoms are present. Partially calcified outpouching from the greater curvature of the stomach. Non-specific but could reflect prior gastric diverticula or remote contain ulcer perforation. Malignant etiology is considered less likely. Hiatal hernia. Aortic Atherosclerosis (ICD10-I70.0). Pelvic floor laxity. These results were called by telephone at the time of interpretation on 12/23/2018 at 10:46 pm to Dr. Stark Jock, who verbally acknowledged these results. Electronically Signed   By: Lovena Le M.D.   On: 12/23/2018 22:46   A/P: [name] is an 73 y.o. female with hx of obesity, HTN, HLD, DM, afib (on Eliquis) - here with what now appears to be diverticular abscess in setting of recent admission in [place] for diverticulitis  -Medicine is admitting the patient - we appreciate their help in her care -NPO, MIVF -IV Zosyn -Consult to IR for evaluation of candidacy for drain placement; will need to be off Eliquis if ok with medicine -We will follow with you  -I have reviewed her diagnosis with her and we discussed the above plan moving forward; she expressed understanding and agreement with the plan of care  Sharon Mt. Dema Severin, M.D. [facility]

## 2018-12-25 LAB — COMPREHENSIVE METABOLIC PANEL
ALT: 20 U/L (ref 0–44)
AST: 18 U/L (ref 15–41)
Albumin: 2.6 g/dL — ABNORMAL LOW (ref 3.5–5.0)
Alkaline Phosphatase: 48 U/L (ref 38–126)
Anion gap: 8 (ref 5–15)
BUN: 7 mg/dL — ABNORMAL LOW (ref 8–23)
CO2: 26 mmol/L (ref 22–32)
Calcium: 8.5 mg/dL — ABNORMAL LOW (ref 8.9–10.3)
Chloride: 106 mmol/L (ref 98–111)
Creatinine, Ser: 0.79 mg/dL (ref 0.44–1.00)
GFR calc Af Amer: >60 mL/min (ref >60)
GFR calc non Af Amer: >60 mL/min (ref >60)
Glucose, Bld: 100 mg/dL — ABNORMAL HIGH (ref 70–99)
Potassium: 2.9 mmol/L — ABNORMAL LOW (ref 3.5–5.1)
Sodium: 140 mmol/L (ref 135–145)
Total Bilirubin: 0.5 mg/dL (ref 0.3–1.2)
Total Protein: 5.1 g/dL — ABNORMAL LOW (ref 6.5–8.1)

## 2018-12-25 LAB — CBC
HCT: 38.3 % (ref 36.0–46.0)
Hemoglobin: 12.6 g/dL (ref 12.0–15.0)
MCH: 30.5 pg (ref 26.0–34.0)
MCHC: 32.9 g/dL (ref 30.0–36.0)
MCV: 92.7 fL (ref 80.0–100.0)
Platelets: 267 10*3/uL (ref 150–400)
RBC: 4.13 MIL/uL (ref 3.87–5.11)
RDW: 13.8 % (ref 11.5–15.5)
WBC: 9.5 10*3/uL (ref 4.0–10.5)
nRBC: 0 % (ref 0.0–0.2)

## 2018-12-25 LAB — APTT: aPTT: 92 seconds — ABNORMAL HIGH (ref 24–36)

## 2018-12-25 LAB — HEPARIN LEVEL (UNFRACTIONATED): Heparin Unfractionated: 1.38 IU/mL — ABNORMAL HIGH (ref 0.30–0.70)

## 2018-12-25 LAB — GLUCOSE, CAPILLARY: Glucose-Capillary: 88 mg/dL (ref 70–99)

## 2018-12-25 LAB — MAGNESIUM: Magnesium: 1.7 mg/dL (ref 1.7–2.4)

## 2018-12-25 MED ORDER — TRAMADOL HCL 50 MG PO TABS: 50.0000 mg | ORAL_TABLET | Freq: Four times a day (QID) | ORAL | Status: DC | PRN

## 2018-12-25 MED ORDER — POTASSIUM CHLORIDE CRYS ER 20 MEQ PO TBCR
40.0000 meq | EXTENDED_RELEASE_TABLET | ORAL | Status: AC
  Administered 2018-12-25 (×2): 40 meq via ORAL
  Filled 2018-12-25 (×2): qty 2

## 2018-12-25 MED ORDER — KETOROLAC TROMETHAMINE 15 MG/ML IJ SOLN: 15.0000 mg | Freq: Four times a day (QID) | INTRAMUSCULAR | Status: DC | PRN

## 2018-12-25 MED ORDER — DEXTROSE-NACL 5-0.9 % IV SOLN
INTRAVENOUS | Status: DC
  Administered 2018-12-25: 13:00:00 via INTRAVENOUS

## 2018-12-25 NOTE — Progress Notes (Addendum)
ANTICOAGULATION CONSULT NOTE - Follow-up Consult  Pharmacy Consult for Heparin Indication: atrial fibrillation  Allergies  Allergen Reactions  . Atenolol Cough  . Erythromycin     Nausea  . Erythromycin Base Nausea Only  . Lactose Intolerance (Gi) Diarrhea    Just MILK   . Plavix [Clopidogrel Bisulfate]     cough  . Sulfa Antibiotics Other (See Comments)    Turned eyes yellow   . Sulfonamide Derivatives     Rash    Patient Measurements: Height: 5\' 8"  (172.7 cm)(previous hospitalization) Weight: 266 lb 12.8 oz (121 kg) IBW/kg (Calculated) : 63.9 Heparin Dosing Weight: 92.4 kg  Vital Signs: Temp: 98.4 F (36.9 C) (07/19 0437) Temp Source: Oral (07/19 0437) BP: 113/57 (07/19 0437) Pulse Rate: 71 (07/19 0437)  Labs: Recent Labs    12/23/18 1627 12/24/18 0211  12/24/18 0545 12/24/18 1201 12/24/18 1828 12/25/18 0207  HGB 14.2 14.2  --   --   --   --  12.6  HCT 43.4 43.4  --   --   --   --  38.3  PLT 333 243  --   --   --   --  267  APTT  --   --    < > 56* 80* 76* 92*  LABPROT  --   --   --  16.4*  --   --   --   INR  --   --   --  1.3*  --   --   --   HEPARINUNFRC  --  >2.20*  --   --   --   --  1.38*  CREATININE 1.14* 1.02*  --   --   --   --  0.79   < > = values in this interval not displayed.    Estimated Creatinine Clearance: 87 mL/min (by C-G formula based on SCr of 0.79 mg/dL).   Medical History: Past Medical History:  Diagnosis Date  . Abnormal Pap smear    Rare ASCUS-H  . Arthritis   . Atrial fibrillation ([place])   . Cancer (HCC)    melanoma rt leg  . Chest pain   . Diabetes mellitus without complication ([place])   . HTN (hypertension) 07/02/2015  . Hypercholesteremia   . Hyperglycemia 02/04/2011  . Hyperlipidemia 02/04/2011  . Hypertension    per pt no htn but needs meds for other reason  . Hypokalemia 05/17/2017  . Injury of right popliteal artery 05/17/2017  . Knee dislocation 05/17/2017  . Leg edema 02/04/2011  . Leg swelling 02/02/2011  .  Leukocytosis 05/17/2017  . Obesity   . PAF (paroxysmal atrial fibrillation) ([place]) 02/02/2011  . Popliteal artery injury 05/17/2017    Medications:  Medications Prior to Admission  Medication Sig Dispense Refill Last Dose  . aspirin EC 81 MG tablet Take 81 mg by mouth See admin instructions. Lydia Guiles, thur, sat   12/22/2018 at 2115  . B Complex Vitamins (B COMPLEX PO) Take 1 tablet by mouth every evening.    Past Week at Unknown time  . Calcium Carbonate-Vit D-Min (GNP CALCIUM PLUS 600 +D PO) Take 1 tablet by mouth every other day.    Past Month at Unknown time  . ciprofloxacin (CIPRO) 500 MG tablet Take 500 mg by mouth every 12 (twelve) hours.   12/23/2018 at 0900  . diltiazem (CARDIZEM CD) 240 MG 24 hr capsule TAKE 1 CAPSULE BY MOUTH DAILY. (Patient taking differently: Take 240 mg by mouth daily. ) 90  capsule 2 12/23/2018 at Unknown time  . ELIQUIS 5 MG TABS tablet TAKE 1 TABLET BY MOUTH 2 TIMES DAILY. (Patient taking differently: Take 5 mg by mouth 2 (two) times daily. ) 60 tablet 9 12/23/2018 at 0900  . loratadine (CLARITIN) 10 MG tablet Take 10 mg by mouth daily.   12/23/2018 at Unknown time  . metroNIDAZOLE (FLAGYL) 500 MG tablet Take 500 mg by mouth 3 (three) times daily.   12/23/2018 at 1500  . mometasone (NASONEX) 50 MCG/ACT nasal spray Place 1 spray into the nose daily as needed for congestion.  0 Past Month at Unknown time  . multivitamin (THERAGRAN) per tablet Take 1 tablet by mouth daily.     12/23/2018 at Unknown time  . OMEPRAZOLE PO Take 20 mg by mouth every evening.    Past Week at Unknown time  . PATADAY 0.2 % SOLN Place 1 drop into both eyes daily.   4 12/23/2018 at Unknown time  . potassium chloride (K-DUR) 10 MEQ tablet TAKE 1 TABLET BY MOUTH DAILY. (Patient taking differently: Take 10 mEq by mouth every morning. ) 90 tablet 3 12/23/2018 at Unknown time  . triamterene-hydrochlorothiazide (MAXZIDE) 75-50 MG tablet Take 1 tablet by mouth daily.    12/23/2018 at Unknown time     Assessment: 73 y.o. female with h/o Afib, Eliquis on hold for possible diverticular abcess/drain placement.   PTA Eliquis 5mg  BID; last dose 7/17 @ 0900  aPTT this morning is 92 which is therapeutic. Heparin level is 1.38 which is supratherapeutic. Will continue to rely on aPTT values until heparin levels begin to correlate with the aPTT. They are falsely high right now since the patient was on Eliquis PTA.  H&H and platelets WNL; no signs of bleeding  Goal of Therapy:  APTT 66-102 Heparin level 0.3-0.7 units/ml Monitor platelets by anticoagulation protocol: Yes   Plan:  -Continue heparin drip at 1400 units/hr -Monitor daily aPTT, heparin level, and CBC -Monitor for signs and symptoms of bleeding -F/u surgical plan and plan to switch back to oral anticoagulation  Kennon Holter, PharmD PGY1 Ambulatory Care Pharmacy Resident [employer] Phone: [phone] 12/25/2018,7:30 AM

## 2018-12-25 NOTE — Progress Notes (Signed)
Patient ID: [name], female   DOB: 09/10/1945, 73 y.o.   MRN: [id number] [facility] Surgery Progress Note:   * No surgery found *  Subjective: Feeling better - still with some LLQ cramps. Denies n/v on clears.  Objective: Vital signs in last 24 hours: Temp:  [98.4 F (36.9 C)-98.9 F (37.2 C)] 98.6 F (37 C) (07/19 0843) Pulse Rate:  [66-71] 67 (07/19 0843) Resp:  [16-18] 18 (07/19 0843) BP: (113-116)/(57-59) 113/58 (07/19 0843) SpO2:  [93 %-100 %] 98 % (07/19 0843) Weight:  [121 kg-121.6 kg] 121 kg (07/19 0437)  Intake/Output from previous day: 07/18 0701 - 07/19 0700 In: 1432.3 [P.O.:360; I.V.:763.1; IV Piggyback:309.3] Out: 1450 [Urine:1450] Intake/Output this shift: Total I/O In: 438 [P.O.:360; I.V.:28; IV Piggyback:50.1] Out: -   Physical Exam: Work of breathing is not labored.  Pain is better but she relapsed from her treatment at [facility].   Abdomen is soft, NT/ND  Lab Results:  Results for orders placed or performed during the hospital encounter of 12/23/18 (from the past 48 hour(s))  Urinalysis, Routine w reflex microscopic     Status: Abnormal   Collection Time: 12/23/18  4:22 PM  Result Value Ref Range   Color, Urine YELLOW YELLOW   APPearance CLEAR CLEAR   Specific Gravity, Urine 1.014 1.005 - 1.030   pH 6.0 5.0 - 8.0   Glucose, UA NEGATIVE NEGATIVE mg/dL   Hgb urine dipstick NEGATIVE NEGATIVE   Bilirubin Urine NEGATIVE NEGATIVE   Ketones, ur NEGATIVE NEGATIVE mg/dL   Protein, ur NEGATIVE NEGATIVE mg/dL   Nitrite NEGATIVE NEGATIVE   Leukocytes,Ua TRACE (A) NEGATIVE   RBC / HPF 0-5 0 - 5 RBC/hpf   WBC, UA 0-5 0 - 5 WBC/hpf   Bacteria, UA NONE SEEN NONE SEEN   Squamous Epithelial / LPF 0-5 0 - 5    Comment: Performed at [facility], [place] [address], [place], [place] [address]  Lipase, blood     Status: None   Collection Time: 12/23/18  4:27 PM  Result Value Ref Range   Lipase 23 11 - 51 U/L    Comment: Performed at [facility] [address] [place], [place] [address]  Comprehensive metabolic panel     Status: Abnormal   Collection Time: 12/23/18  4:27 PM  Result Value Ref Range   Sodium 139 135 - 145 mmol/L   Potassium 3.2 (L) 3.5 - 5.1 mmol/L   Chloride 103 98 - 111 mmol/L   CO2 26 22 - 32 mmol/L   Glucose, Bld 132 (H) 70 - 99 mg/dL   BUN 21 8 - 23 mg/dL   Creatinine, Ser 1.14 (H) 0.44 - 1.00 mg/dL   Calcium 9.7 8.9 - 10.3 mg/dL   Total Protein 6.0 (L) 6.5 - 8.1 g/dL   Albumin 3.2 (L) 3.5 - 5.0 g/dL   AST 23 15 - 41 U/L   ALT 33 0 - 44 U/L   Alkaline Phosphatase 59 38 - 126 U/L   Total Bilirubin 0.6 0.3 - 1.2 mg/dL   GFR calc non Af Amer 48 (L) >60 mL/min   GFR calc Af Amer 56 (L) >60 mL/min   Anion gap 10 5 - 15    Comment: Performed at [facility], [place] [address], [place], [place] [address]  CBC     Status: Abnormal   Collection Time: 12/23/18  4:27 PM  Result Value Ref Range   WBC 16.3 (H) 4.0 -  10.5 K/uL   RBC 4.62 3.87 - 5.11 MIL/uL   Hemoglobin 14.2 12.0 - 15.0 g/dL   HCT 43.4 36.0 - 46.0 %   MCV 93.9 80.0 - 100.0 fL   MCH 30.7 26.0 - 34.0 pg   MCHC 32.7 30.0 - 36.0 g/dL   RDW 13.8 11.5 - 15.5 %   Platelets 333 150 - 400 K/uL   nRBC 0.0 0.0 - 0.2 %    Comment: Performed at [facility], [place] [address], [place], [place] [address]  SARS Coronavirus 2 (CEPHEID - Performed in [place] hospital lab), Hosp Order     Status: None   Collection Time: 12/24/18 12:20 AM   Specimen: Nasopharyngeal Swab  Result Value Ref Range   SARS Coronavirus 2 NEGATIVE NEGATIVE    Comment: (NOTE) If result is NEGATIVE SARS-CoV-2 target nucleic acids are NOT DETECTED. The SARS-CoV-2 RNA is generally detectable in upper and lower  respiratory specimens during the acute phase of infection. The lowest  concentration of SARS-CoV-2 viral copies this assay can detect is 250  copies / mL. A negative result does not preclude SARS-CoV-2 infection  and should not be used as the sole basis  for treatment or other  patient management decisions.  A negative result may occur with  improper specimen collection / handling, submission of specimen other  than nasopharyngeal swab, presence of viral mutation(s) within the  areas targeted by this assay, and inadequate number of viral copies  (<250 copies / mL). A negative result must be combined with clinical  observations, patient history, and epidemiological information. If result is POSITIVE SARS-CoV-2 target nucleic acids are DETECTED. The SARS-CoV-2 RNA is generally detectable in upper and lower  respiratory specimens dur ing the acute phase of infection.  Positive  results are indicative of active infection with SARS-CoV-2.  Clinical  correlation with patient history and other diagnostic information is  necessary to determine patient infection status.  Positive results do  not rule out bacterial infection or co-infection with other viruses. If result is PRESUMPTIVE POSTIVE SARS-CoV-2 nucleic acids MAY BE PRESENT.   A presumptive positive result was obtained on the submitted specimen  and confirmed on repeat testing.  While 2019 novel coronavirus  (SARS-CoV-2) nucleic acids may be present in the submitted sample  additional confirmatory testing may be necessary for epidemiological  and / or clinical management purposes  to differentiate between  SARS-CoV-2 and other Sarbecovirus currently known to infect humans.  If clinically indicated additional testing with an alternate test  methodology [phone]) is advised. The SARS-CoV-2 RNA is generally  detectable in upper and lower respiratory sp ecimens during the acute  phase of infection. The expected result is Negative. Fact Sheet for Patients:  StrictlyIdeas.no Fact Sheet for Healthcare Providers: BankingDealers.co.za This test is not yet approved or cleared by the [place] FDA and has been authorized for detection and/or  diagnosis of SARS-CoV-2 by FDA under an Emergency Use Authorization (EUA).  This EUA will remain in effect (meaning this test can be used) for the duration of the COVID-19 declaration under Section 564(b)(1) of the Act, 21 U.S.C. section 360bbb-3(b)(1), unless the authorization is terminated or revoked sooner. Performed at [facility], [place] [address] [place], [place] [address]   Comprehensive metabolic panel     Status: Abnormal   Collection Time: 12/24/18  2:11 AM  Result Value Ref Range   Sodium 138 135 - 145 mmol/L   Potassium 3.1 (L) 3.5 - 5.1  mmol/L   Chloride 106 98 - 111 mmol/L   CO2 20 (L) 22 - 32 mmol/L   Glucose, Bld 112 (H) 70 - 99 mg/dL   BUN 18 8 - 23 mg/dL   Creatinine, Ser 1.02 (H) 0.44 - 1.00 mg/dL   Calcium 9.1 8.9 - 10.3 mg/dL   Total Protein 5.5 (L) 6.5 - 8.1 g/dL   Albumin 3.0 (L) 3.5 - 5.0 g/dL   AST 22 15 - 41 U/L   ALT 27 0 - 44 U/L   Alkaline Phosphatase 54 38 - 126 U/L   Total Bilirubin 0.6 0.3 - 1.2 mg/dL   GFR calc non Af Amer 55 (L) >60 mL/min   GFR calc Af Amer >60 >60 mL/min   Anion gap 12 5 - 15    Comment: Performed at [facility] [address], [place], [place] [address]  CBC WITH DIFFERENTIAL     Status: Abnormal   Collection Time: 12/24/18  2:11 AM  Result Value Ref Range   WBC 11.3 (H) 4.0 - 10.5 K/uL   RBC 4.68 3.87 - 5.11 MIL/uL   Hemoglobin 14.2 12.0 - 15.0 g/dL   HCT 43.4 36.0 - 46.0 %   MCV 92.7 80.0 - 100.0 fL   MCH 30.3 26.0 - 34.0 pg   MCHC 32.7 30.0 - 36.0 g/dL   RDW 14.0 11.5 - 15.5 %   Platelets 243 150 - 400 K/uL   nRBC 0.0 0.0 - 0.2 %   Neutrophils Relative % 71 %   Neutro Abs 8.0 (H) 1.7 - 7.7 K/uL   Lymphocytes Relative 17 %   Lymphs Abs 1.9 0.7 - 4.0 K/uL   Monocytes Relative 9 %   Monocytes Absolute 1.1 (H) 0.1 - 1.0 K/uL   Eosinophils Relative 2 %   Eosinophils Absolute 0.2 0.0 - 0.5 K/uL   Basophils Relative 0 %   Basophils Absolute 0.0 0.0 - 0.1 K/uL   Immature Granulocytes 1 %   Abs  Immature Granulocytes 0.07 0.00 - 0.07 K/uL    Comment: Performed at [facility], [address], [place], [place] [address]  Heparin level (unfractionated)     Status: Abnormal   Collection Time: 12/24/18  2:11 AM  Result Value Ref Range   Heparin Unfractionated >2.20 (H) 0.30 - 0.70 IU/mL    Comment: RESULTS CONFIRMED BY MANUAL DILUTION (NOTE) If heparin results are below expected values, and patient dosage has  been confirmed, suggest follow up testing of antithrombin III levels. Performed at [facility], [place] [address], [place], [place] [address]   Protime-INR     Status: Abnormal   Collection Time: 12/24/18  5:45 AM  Result Value Ref Range   Prothrombin Time 16.4 (H) 11.4 - 15.2 seconds   INR 1.3 (H) 0.8 - 1.2    Comment: (NOTE) INR goal varies based on device and disease states. Performed at [facility], [place] [address], [place], [place] [address]   APTT     Status: Abnormal   Collection Time: 12/24/18  5:45 AM  Result Value Ref Range   aPTT 56 (H) 24 - 36 seconds    Comment:        IF BASELINE aPTT IS ELEVATED, SUGGEST PATIENT RISK ASSESSMENT BE USED TO DETERMINE APPROPRIATE ANTICOAGULANT THERAPY. Performed at [facility], [place] [address], [place], [place] [address]   Glucose, capillary     Status: Abnormal   Collection Time: 12/24/18  7:08 AM  Result Value Ref Range   Glucose-Capillary 122 (H) 70 - 99 mg/dL  Glucose, capillary     Status: None   Collection Time: 12/24/18 11:37 AM  Result Value Ref Range   Glucose-Capillary 89 70 - 99 mg/dL  APTT     Status: Abnormal   Collection Time: 12/24/18 12:01 PM  Result Value Ref Range   aPTT 80 (H) 24 - 36 seconds    Comment:        IF BASELINE aPTT IS ELEVATED, SUGGEST PATIENT RISK ASSESSMENT BE USED TO DETERMINE APPROPRIATE ANTICOAGULANT THERAPY. Performed at [facility], [place] [address], [place], [place] [address]   Glucose, capillary     Status: Abnormal   Collection Time:  12/24/18  4:35 PM  Result Value Ref Range   Glucose-Capillary 127 (H) 70 - 99 mg/dL  APTT     Status: Abnormal   Collection Time: 12/24/18  6:28 PM  Result Value Ref Range   aPTT 76 (H) 24 - 36 seconds    Comment:        IF BASELINE aPTT IS ELEVATED, SUGGEST PATIENT RISK ASSESSMENT BE USED TO DETERMINE APPROPRIATE ANTICOAGULANT THERAPY. Performed at [facility], [place] [address], [place], [place] [address]   APTT     Status: Abnormal   Collection Time: 12/25/18  2:07 AM  Result Value Ref Range   aPTT 92 (H) 24 - 36 seconds    Comment:        IF BASELINE aPTT IS ELEVATED, SUGGEST PATIENT RISK ASSESSMENT BE USED TO DETERMINE APPROPRIATE ANTICOAGULANT THERAPY. Performed at [facility], [place] [address] [place], [place] [address]   Heparin level (unfractionated)     Status: Abnormal   Collection Time: 12/25/18  2:07 AM  Result Value Ref Range   Heparin Unfractionated 1.38 (H) 0.30 - 0.70 IU/mL    Comment: RESULTS CONFIRMED BY MANUAL DILUTION (NOTE) If heparin results are below expected values, and patient dosage has  been confirmed, suggest follow up testing of antithrombin III levels. Performed at [facility], [place] [address] [place], [place] [address]   CBC     Status: None   Collection Time: 12/25/18  2:07 AM  Result Value Ref Range   WBC 9.5 4.0 - 10.5 K/uL   RBC 4.13 3.87 - 5.11 MIL/uL   Hemoglobin 12.6 12.0 - 15.0 g/dL   HCT 38.3 36.0 - 46.0 %   MCV 92.7 80.0 - 100.0 fL   MCH 30.5 26.0 - 34.0 pg   MCHC 32.9 30.0 - 36.0 g/dL   RDW 13.8 11.5 - 15.5 %   Platelets 267 150 - 400 K/uL   nRBC 0.0 0.0 - 0.2 %    Comment: Performed at [facility], [place] [address] [place], [place] [address]  Comprehensive metabolic panel     Status: Abnormal   Collection Time: 12/25/18  2:07 AM  Result Value Ref Range   Sodium 140 135 - 145 mmol/L   Potassium 2.9 (L) 3.5 - 5.1 mmol/L   Chloride 106 98 - 111 mmol/L   CO2 26 22 - 32 mmol/L   Glucose, Bld 100 (H)  70 - 99 mg/dL   BUN 7 (L) 8 - 23 mg/dL   Creatinine, Ser 0.79 0.44 - 1.00 mg/dL   Calcium 8.5 (L) 8.9 - 10.3 mg/dL   Total Protein 5.1 (L) 6.5 - 8.1 g/dL   Albumin 2.6 (L) 3.5 - 5.0 g/dL  AST 18 15 - 41 U/L   ALT 20 0 - 44 U/L   Alkaline Phosphatase 48 38 - 126 U/L   Total Bilirubin 0.5 0.3 - 1.2 mg/dL   GFR calc non Af Amer >60 >60 mL/min   GFR calc Af Amer >60 >60 mL/min   Anion gap 8 5 - 15    Comment: Performed at [facility] [address], [place], [place] [address]  Magnesium     Status: None   Collection Time: 12/25/18  2:07 AM  Result Value Ref Range   Magnesium 1.7 1.7 - 2.4 mg/dL    Comment: Performed at [facility] [address], [place], [place] [address]  Glucose, capillary     Status: None   Collection Time: 12/25/18  6:46 AM  Result Value Ref Range   Glucose-Capillary 88 70 - 99 mg/dL    Radiology/Results: Ct Abdomen Pelvis W Contrast  Result Date: 12/23/2018 CLINICAL DATA:  Prior hospital admission for diverticulitis with colonic rupture. Persistent low-grade fever. EXAM: CT ABDOMEN AND PELVIS WITH CONTRAST TECHNIQUE: Multidetector CT imaging of the abdomen and pelvis was performed using the standard protocol following bolus administration of intravenous contrast. CONTRAST:  129mL OMNIPAQUE IOHEXOL 300 MG/ML  SOLN COMPARISON:  None. FINDINGS: Lower chest: Basilar atelectatic changes and areas of bronchiectasis with architectural distortion. Mild cardiomegaly. No pericardial effusion atherosclerotic calcification of the coronary arteries. Aortic leaflet calcifications are present. Hepatobiliary: No focal liver abnormality is seen. Patient is post cholecystectomy. Slight prominence of the biliary tree likely related to reservoir effect. No calcified intraductal gallstones. Pancreas: Fatty replacement of the pancreas. No pancreatic ductal dilatation or surrounding inflammatory changes. Spleen: Normal in size without focal abnormality. Accessory hilar  splenule. Adrenals/Urinary Tract: Simple fluid attenuation cyst in the upper pole left kidney measuring up to 4.9 cm. No concerning renal masses. No hydronephrosis. Mild asymmetric bladder wall thickening most pronounced along the left superolateral dome, likely reactive. Stomach/Bowel: Small hiatal hernia. Partially calcified ovoid projection from the greater curvature of the stomach. Duodenal sweep is unremarkable. No small bowel thickening or dilatation. No obstruction. High-density material within the tip of the otherwise normal appendix. There is focal segmental thickening and pericolonic inflammation centered upon a diverticular outpouching in the proximal sigmoid colon (coronal 85, axial 80) with adjacent phlegmonous change with a developing air and fluid containing rim enhancing collection along the superolateral left bladder dome. No resultant obstruction. Vascular/Lymphatic: Atherosclerotic plaque within the normal caliber aorta. Few scattered reactive appearing mesenteric lymph nodes. No pathologically enlarged nodes. Reproductive: Uterus is surgically absent. No concerning adnexal lesions. Other: Rim enhancing collection in the low left pelvis, detailed above. No other foci of free fluid or gas. Pelvic floor laxity. Musculoskeletal: Pelvic floor laxity. No acute osseous abnormality or suspicious osseous lesion. Multilevel degenerative changes are present in the imaged portions of the spine. At least canal stenosis and moderate foraminal stenosis on the right at L3-4. IMPRESSION: Findings consistent with acute sigmoid diverticulitis with a developing air and fluid containing collection along the superolateral left bladder dome, consistent with abscess. No resultant obstruction. Asymmetric bladder thickening is likely reactive though could consider urinalysis if urinary symptoms are present. Partially calcified outpouching from the greater curvature of the stomach. Non-specific but could reflect prior  gastric diverticula or remote contain ulcer perforation. Malignant etiology is considered less likely. Hiatal hernia. Aortic Atherosclerosis (ICD10-I70.0). Pelvic floor laxity. These results were called by telephone at the time of interpretation on 12/23/2018 at 10:46 pm  to Dr. Stark Jock, who verbally acknowledged these results. Electronically Signed   By: Lovena Le M.D.   On: 12/23/2018 22:46    Anti-infectives: Anti-infectives (From admission, onward)   Start     Dose/Rate Route Frequency Ordered Stop   12/24/18 0300  piperacillin-tazobactam (ZOSYN) IVPB 3.375 g     3.375 g 12.5 mL/hr over 240 Minutes Intravenous Every 8 hours 12/24/18 0210     12/23/18 2345  piperacillin-tazobactam (ZOSYN) IVPB 3.375 g     3.375 g 100 mL/hr over 30 Minutes Intravenous  Once 12/23/18 2341 12/24/18 0052      Assessment/Plan: Problem List: Patient Active Problem List   Diagnosis Date Noted  . Colonic diverticular abscess 12/23/2018  . Pain in right foot 02/23/2018  . Pain of left hip joint 08/11/2017  . Stiffness of right knee 07/28/2017  . 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 Trochanteric bursitis of left hip 07/02/2017  . Knee dislocation 05/17/2017  . Popliteal artery injury 05/17/2017  . Hypokalemia 05/17/2017  . Leukocytosis 05/17/2017  . Injury of right popliteal artery 05/17/2017  . HTN (hypertension) 07/02/2015  . Hyperlipidemia 02/04/2011  . Hyperglycemia 02/04/2011  . Leg edema 02/04/2011  . PAF (paroxysmal atrial fibrillation) ([place]) 02/02/2011  . Leg swelling 02/02/2011    WBC normalized  Ok to advance diet as tolerated Continue IV abx today  * No surgery found *    LOS: 2 days   Sharon Mt. Dema Severin, M.D. [facility]  12/25/2018 10:08 AM

## 2018-12-25 NOTE — Plan of Care (Signed)
  Problem: Education: Goal: Knowledge of General Education information will improve Description: Including pain rating scale, medication(s)/side effects and non-pharmacologic comfort measures Outcome: Progressing   Problem: Clinical Measurements: Goal: Diagnostic test results will improve Outcome: Progressing   Problem: Elimination: Goal: Will not experience complications related to bowel motility Outcome: Progressing   

## 2018-12-25 NOTE — Progress Notes (Signed)
Patient ID: Alita Waldren, female   DOB: [birth year], 73 y.o.   MRN: [id number]  PROGRESS NOTE    Tamea Bai  UTM:[id number] DOB: 1945/11/18 DOA: 12/23/2018 PCP: Leonard Downing, MD   Brief Narrative:  73 year old female with history of atrial fibrillation, hypertension, popliteal artery dissection status post surgery, recent treatment for diverticulitis with possible microperforation in [place] and discharged 5 days prior to presentation presented with fever and abdominal soreness.  Initial CT scan showed acute sigmoid diverticulitis with possible developing abscess.  General surgery was consulted.  Patient was started on broad-spectrum antibiotics.  IR was also consulted but  drain could not be placed because it was too small to place drain as per IR.  Assessment & Plan:   Acute sigmoid diverticulitis with possible developing abscess -Patient was recently treated for diverticulitis with possible microperforation in [place] and discharged 5 days prior to presentation -Currently on Zosyn.  Still has intermittent lower abdominal soreness.  General surgery following; diet as per general surgery recommendations -Cultures negative so far -IR was also consulted but  drain could not be placed because it was too small to place drain as per IR. -Pain management -No worsening leukocytosis.  Afebrile  Paroxysmal atrial fibrillation -Eliquis on hold.  Continue heparin drip.  Currently rate controlled.  Continue Cardizem  Hypertension -Blood pressure stable.  Monitor  Acute kidney injury -Improving  Hypokalemia  -Replace.  Repeat a.m. labs   DVT prophylaxis: Heparin drip Code Status: Full Family Communication: Discussed with patient  disposition Plan: We will need to be inpatient for several days for IV antibiotics  Consultants: General surgery Procedures: None  Antimicrobials: Zosyn from 12/23/2018 onwards   Subjective: Patient  seen and examined at bedside.  Complains of intermittent lower abdominal soreness.  No overnight fever or vomiting.  No worsening shortness of breath.  Objective: Vitals:   12/24/18 1637 12/24/18 2037 12/25/18 0437 12/25/18 0843  BP: (!) 116/57 (!) 115/59 (!) 113/57 (!) 113/58  Pulse: 66 67 71 67  Resp: 18 18 16 18   Temp:  98.9 F (37.2 C) 98.4 F (36.9 C) 98.6 F (37 C)  TempSrc:  Oral Oral Oral  SpO2:  100% 93% 98%  Weight:   121 kg   Height:        Intake/Output Summary (Last 24 hours) at 12/25/2018 0952 Last data filed at 12/25/2018 0900 Gross per 24 hour  Intake 1514.26 ml  Output 1000 ml  Net 514.26 ml   Filed Weights   12/24/18 1300 12/25/18 0437  Weight: 121.6 kg 121 kg    Examination:  General exam: Appears calm and comfortable  Respiratory system: Bilateral decreased breath sounds at bases Cardiovascular system: S1 & S2 heard, Rate controlled Gastrointestinal system: Abdomen is nondistended, soft and mildly tender in the lower quadrant. Normal bowel sounds heard. Extremities: No cyanosis, clubbing, edema  Central nervous system: Alert and oriented. No focal neurological deficits. Moving extremities Skin: No rashes, lesions or ulcers Psychiatry: Judgement and insight appear normal. Mood & affect appropriate.     Data Reviewed: I have personally reviewed following labs and imaging studies  CBC: Recent Labs  Lab 12/23/18 1627 12/24/18 0211 12/25/18 0207  WBC 16.3* 11.3* 9.5  NEUTROABS  --  8.0*  --   HGB 14.2 14.2 12.6  HCT 43.4 43.4 38.3  MCV 93.9 92.7 92.7  PLT 333 243 568   Basic Metabolic Panel: Recent Labs  Lab 12/23/18 1627 12/24/18 0211 12/25/18 0207  NA 139 138 140  K 3.2* 3.1* 2.9*  CL 103 106 106  CO2 26 20* 26  GLUCOSE 132* 112* 100*  BUN 21 18 7*  CREATININE 1.14* 1.02* 0.79  CALCIUM 9.7 9.1 8.5*  MG  --   --  1.7   GFR: Estimated Creatinine Clearance: 87 mL/min (by C-G formula based on SCr of 0.79 mg/dL). Liver Function  Tests: Recent Labs  Lab 12/23/18 1627 12/24/18 0211 12/25/18 0207  AST 23 22 18   ALT 33 27 20  ALKPHOS 59 54 48  BILITOT 0.6 0.6 0.5  PROT 6.0* 5.5* 5.1*  ALBUMIN 3.2* 3.0* 2.6*   Recent Labs  Lab 12/23/18 1627  LIPASE 23   No results for input(s): AMMONIA in the last 168 hours. Coagulation Profile: Recent Labs  Lab 12/24/18 0545  INR 1.3*   Cardiac Enzymes: No results for input(s): CKTOTAL, CKMB, CKMBINDEX, TROPONINI in the last 168 hours. BNP (last 3 results) No results for input(s): PROBNP in the last 8760 hours. HbA1C: No results for input(s): HGBA1C in the last 72 hours. CBG: Recent Labs  Lab 12/24/18 0708 12/24/18 1137 12/24/18 1635 12/25/18 0646  GLUCAP 122* 89 127* 88   Lipid Profile: No results for input(s): CHOL, HDL, LDLCALC, TRIG, CHOLHDL, LDLDIRECT in the last 72 hours. Thyroid Function Tests: No results for input(s): TSH, T4TOTAL, FREET4, T3FREE, THYROIDAB in the last 72 hours. Anemia Panel: No results for input(s): VITAMINB12, FOLATE, FERRITIN, TIBC, IRON, RETICCTPCT in the last 72 hours. Sepsis Labs: No results for input(s): PROCALCITON, LATICACIDVEN in the last 168 hours.  Recent Results (from the past 240 hour(s))  SARS Coronavirus 2 (CEPHEID - Performed in [place] hospital lab), Hosp Order     Status: None   Collection Time: 12/24/18 12:20 AM   Specimen: Nasopharyngeal Swab  Result Value Ref Range Status   SARS Coronavirus 2 NEGATIVE NEGATIVE Final    Comment: (NOTE) If result is NEGATIVE SARS-CoV-2 target nucleic acids are NOT DETECTED. The SARS-CoV-2 RNA is generally detectable in upper and lower  respiratory specimens during the acute phase of infection. The lowest  concentration of SARS-CoV-2 viral copies this assay can detect is 250  copies / mL. A negative result does not preclude SARS-CoV-2 infection  and should not be used as the sole basis for treatment or other  patient management decisions.  A negative result may occur  with  improper specimen collection / handling, submission of specimen other  than nasopharyngeal swab, presence of viral mutation(s) within the  areas targeted by this assay, and inadequate number of viral copies  (<250 copies / mL). A negative result must be combined with clinical  observations, patient history, and epidemiological information. If result is POSITIVE SARS-CoV-2 target nucleic acids are DETECTED. The SARS-CoV-2 RNA is generally detectable in upper and lower  respiratory specimens dur ing the acute phase of infection.  Positive  results are indicative of active infection with SARS-CoV-2.  Clinical  correlation with patient history and other diagnostic information is  necessary to determine patient infection status.  Positive results do  not rule out bacterial infection or co-infection with other viruses. If result is PRESUMPTIVE POSTIVE SARS-CoV-2 nucleic acids MAY BE PRESENT.   A presumptive positive result was obtained on the submitted specimen  and confirmed on repeat testing.  While 2019 novel coronavirus  (SARS-CoV-2) nucleic acids may be present in the submitted sample  additional confirmatory testing may be necessary for epidemiological  and / or clinical management purposes  to differentiate between  SARS-CoV-2 and other Sarbecovirus currently known to infect humans.  If clinically indicated additional testing with an alternate test  methodology [phone]) is advised. The SARS-CoV-2 RNA is generally  detectable in upper and lower respiratory sp ecimens during the acute  phase of infection. The expected result is Negative. Fact Sheet for Patients:  StrictlyIdeas.no Fact Sheet for Healthcare Providers: BankingDealers.co.za This test is not yet approved or cleared by the [place] FDA and has been authorized for detection and/or diagnosis of SARS-CoV-2 by FDA under an Emergency Use Authorization (EUA).  This EUA  will remain in effect (meaning this test can be used) for the duration of the COVID-19 declaration under Section 564(b)(1) of the Act, 21 U.S.C. section 360bbb-3(b)(1), unless the authorization is terminated or revoked sooner. Performed at [facility], [place] [address], [place], [place] [address]          Radiology Studies: Ct Abdomen Pelvis W Contrast  Result Date: 12/23/2018 CLINICAL DATA:  Prior hospital admission for diverticulitis with colonic rupture. Persistent low-grade fever. EXAM: CT ABDOMEN AND PELVIS WITH CONTRAST TECHNIQUE: Multidetector CT imaging of the abdomen and pelvis was performed using the standard protocol following bolus administration of intravenous contrast. CONTRAST:  173mL OMNIPAQUE IOHEXOL 300 MG/ML  SOLN COMPARISON:  None. FINDINGS: Lower chest: Basilar atelectatic changes and areas of bronchiectasis with architectural distortion. Mild cardiomegaly. No pericardial effusion atherosclerotic calcification of the coronary arteries. Aortic leaflet calcifications are present. Hepatobiliary: No focal liver abnormality is seen. Patient is post cholecystectomy. Slight prominence of the biliary tree likely related to reservoir effect. No calcified intraductal gallstones. Pancreas: Fatty replacement of the pancreas. No pancreatic ductal dilatation or surrounding inflammatory changes. Spleen: Normal in size without focal abnormality. Accessory hilar splenule. Adrenals/Urinary Tract: Simple fluid attenuation cyst in the upper pole left kidney measuring up to 4.9 cm. No concerning renal masses. No hydronephrosis. Mild asymmetric bladder wall thickening most pronounced along the left superolateral dome, likely reactive. Stomach/Bowel: Small hiatal hernia. Partially calcified ovoid projection from the greater curvature of the stomach. Duodenal sweep is unremarkable. No small bowel thickening or dilatation. No obstruction. High-density material within the tip of the otherwise normal  appendix. There is focal segmental thickening and pericolonic inflammation centered upon a diverticular outpouching in the proximal sigmoid colon (coronal 85, axial 80) with adjacent phlegmonous change with a developing air and fluid containing rim enhancing collection along the superolateral left bladder dome. No resultant obstruction. Vascular/Lymphatic: Atherosclerotic plaque within the normal caliber aorta. Few scattered reactive appearing mesenteric lymph nodes. No pathologically enlarged nodes. Reproductive: Uterus is surgically absent. No concerning adnexal lesions. Other: Rim enhancing collection in the low left pelvis, detailed above. No other foci of free fluid or gas. Pelvic floor laxity. Musculoskeletal: Pelvic floor laxity. No acute osseous abnormality or suspicious osseous lesion. Multilevel degenerative changes are present in the imaged portions of the spine. At least canal stenosis and moderate foraminal stenosis on the right at L3-4. IMPRESSION: Findings consistent with acute sigmoid diverticulitis with a developing air and fluid containing collection along the superolateral left bladder dome, consistent with abscess. No resultant obstruction. Asymmetric bladder thickening is likely reactive though could consider urinalysis if urinary symptoms are present. Partially calcified outpouching from the greater curvature of the stomach. Non-specific but could reflect prior gastric diverticula or remote contain ulcer perforation. Malignant etiology is considered less likely. Hiatal hernia. Aortic Atherosclerosis (ICD10-I70.0). Pelvic floor laxity. These results were called by telephone at the time of interpretation on 12/23/2018  at 10:46 pm to Dr. Stark Jock, who verbally acknowledged these results. Electronically Signed   By: Lovena Le M.D.   On: 12/23/2018 22:46        Scheduled Meds: . diltiazem  240 mg Oral Daily  . potassium chloride  40 mEq Oral Q4H  . sodium chloride flush  3 mL Intravenous  Once   Continuous Infusions: . heparin 1,400 Units/hr (12/25/18 0800)  . piperacillin-tazobactam (ZOSYN)  IV 3.375 g (12/25/18 0827)     LOS: 2 days        Aline August, MD [facility] 12/25/2018, 9:52 AM

## 2018-12-26 LAB — BASIC METABOLIC PANEL
Anion gap: 10 (ref 5–15)
BUN: 5 mg/dL — ABNORMAL LOW (ref 8–23)
CO2: 22 mmol/L (ref 22–32)
Calcium: 8.6 mg/dL — ABNORMAL LOW (ref 8.9–10.3)
Chloride: 109 mmol/L (ref 98–111)
Creatinine, Ser: 0.77 mg/dL (ref 0.44–1.00)
GFR calc Af Amer: >60 mL/min (ref >60)
GFR calc non Af Amer: >60 mL/min (ref >60)
Glucose, Bld: 99 mg/dL (ref 70–99)
Potassium: 3.4 mmol/L — ABNORMAL LOW (ref 3.5–5.1)
Sodium: 141 mmol/L (ref 135–145)

## 2018-12-26 LAB — CBC
HCT: 39.7 % (ref 36.0–46.0)
Hemoglobin: 12.8 g/dL (ref 12.0–15.0)
MCH: 30.4 pg (ref 26.0–34.0)
MCHC: 32.2 g/dL (ref 30.0–36.0)
MCV: 94.3 fL (ref 80.0–100.0)
Platelets: 231 10*3/uL (ref 150–400)
RBC: 4.21 MIL/uL (ref 3.87–5.11)
RDW: 14 % (ref 11.5–15.5)
WBC: 7.2 10*3/uL (ref 4.0–10.5)
nRBC: 0 % (ref 0.0–0.2)

## 2018-12-26 LAB — HEPARIN LEVEL (UNFRACTIONATED): Heparin Unfractionated: 1.05 IU/mL — ABNORMAL HIGH (ref 0.30–0.70)

## 2018-12-26 LAB — MAGNESIUM: Magnesium: 1.7 mg/dL (ref 1.7–2.4)

## 2018-12-26 LAB — APTT: aPTT: 75 seconds — ABNORMAL HIGH (ref 24–36)

## 2018-12-26 MED ORDER — POTASSIUM CHLORIDE CRYS ER 20 MEQ PO TBCR
40.0000 meq | EXTENDED_RELEASE_TABLET | Freq: Once | ORAL | Status: AC
  Administered 2018-12-26: 40 meq via ORAL
  Filled 2018-12-26: qty 2

## 2018-12-26 NOTE — Progress Notes (Signed)
Patient ID: [name], female   DOB: Jul 05, 1945, 73 y.o.   MRN: [id number]       Subjective: Patient feels well today.  No abdominal pain.  Some pelvic floor pressure when she needs to urinate.  Tolerating clear liquids with no issues.  Objective: Vital signs in last 24 hours: Temp:  [98.2 F (36.8 C)] 98.2 F (36.8 C) (07/20 0300) Pulse Rate:  [63] 63 (07/20 0300) Resp:  [16] 16 (07/20 0300) BP: (136)/(73) 136/73 (07/20 0300) SpO2:  [95 %] 95 % (07/20 0300) Weight:  [120.7 kg] 120.7 kg (07/19 2300) Last BM Date: 12/26/18  Intake/Output from previous day: 07/19 0701 - 07/20 0700 In: 2610.4 [P.O.:1560; I.V.:800.4; IV Piggyback:150.1] Out: 1200 [Urine:1200] Intake/Output this shift: No intake/output data recorded.  PE: Heart: regular Lungs: CTAB Abd: soft, NT, ND, +BS, obese  Lab Results:  Recent Labs    12/25/18 0207 12/26/18 0420  WBC 9.5 7.2  HGB 12.6 12.8  HCT 38.3 39.7  PLT 267 231   BMET Recent Labs    12/25/18 0207 12/26/18 0420  NA 140 141  K 2.9* 3.4*  CL 106 109  CO2 26 22  GLUCOSE 100* 99  BUN 7* 5*  CREATININE 0.79 0.77  CALCIUM 8.5* 8.6*   PT/INR Recent Labs    12/24/18 0545  LABPROT 16.4*  INR 1.3*   CMP     Component Value Date/Time   NA 141 12/26/2018 0420   NA 141 02/28/2018 0954   K 3.4 (L) 12/26/2018 0420   CL 109 12/26/2018 0420   CO2 22 12/26/2018 0420   GLUCOSE 99 12/26/2018 0420   BUN 5 (L) 12/26/2018 0420   BUN 18 02/28/2018 0954   CREATININE 0.77 12/26/2018 0420   CREATININE 0.79 01/21/2016 0947   CALCIUM 8.6 (L) 12/26/2018 0420   PROT 5.1 (L) 12/25/2018 0207   PROT 5.6 (L) 02/28/2018 0954   ALBUMIN 2.6 (L) 12/25/2018 0207   ALBUMIN 3.8 02/28/2018 0954   AST 18 12/25/2018 0207   ALT 20 12/25/2018 0207   ALKPHOS 48 12/25/2018 0207   BILITOT 0.5 12/25/2018 0207   BILITOT 0.9 02/28/2018 0954   GFRNONAA >60 12/26/2018 0420   GFRAA >60 12/26/2018 0420   Lipase     Component Value Date/Time   LIPASE  23 12/23/2018 1627       Studies/Results: No results found.  Anti-infectives: Anti-infectives (From admission, onward)   Start     Dose/Rate Route Frequency Ordered Stop   12/24/18 0300  piperacillin-tazobactam (ZOSYN) IVPB 3.375 g     3.375 g 12.5 mL/hr over 240 Minutes Intravenous Every 8 hours 12/24/18 0210     12/23/18 2345  piperacillin-tazobactam (ZOSYN) IVPB 3.375 g     3.375 g 100 mL/hr over 30 Minutes Intravenous  Once 12/23/18 2341 12/24/18 0052       Assessment/Plan Diverticulitis with small undrainable collection -AF, WBC normal. -adv to low fiber diet today -cont abx therapy -cont heparin, if tolerates diet today then can DC and convert to eliquis tomorrow along with possible DC home if doing well -dietitian consult today for diet education for low fiber and then high fiber diet -no plans for repeat scan currently as she is improving.  FEN - low fiber diet VTE - heparin gtt ID - [email or web address]   LOS: 3 days    Henreitta Cea , [facility] 12/26/2018, 8:56 AM Pager: [phone]

## 2018-12-26 NOTE — Progress Notes (Signed)
ANTICOAGULATION CONSULT NOTE - Follow-up Consult  Pharmacy Consult for Heparin Indication: atrial fibrillation  Allergies  Allergen Reactions  . Sulfa Antibiotics Other (See Comments)    Turned eyes yellow   . Sulfonamide Derivatives Rash    Rash  . Atenolol Cough  . Erythromycin     Nausea  . Erythromycin Base Nausea Only  . Lactose Intolerance (Gi) Diarrhea    Just MILK   . Plavix [Clopidogrel Bisulfate] Cough    cough    Patient Measurements: Height: 5\' 8"  (172.7 cm)(previous hospitalization) Weight: 266 lb 1.5 oz (120.7 kg) IBW/kg (Calculated) : 63.9 Heparin Dosing Weight: 92.4 kg  Vital Signs: Temp: 98.2 F (36.8 C) (07/20 0300) Temp Source: Oral (07/20 0300) BP: 136/73 (07/20 0300) Pulse Rate: 63 (07/20 0300)  Labs: Recent Labs    12/24/18 0211 12/24/18 0545  12/24/18 1828 12/25/18 0207 12/26/18 0420  HGB 14.2  --   --   --  12.6 12.8  HCT 43.4  --   --   --  38.3 39.7  PLT 243  --   --   --  267 231  APTT  --  56*   < > 76* 92* 75*  LABPROT  --  16.4*  --   --   --   --   INR  --  1.3*  --   --   --   --   HEPARINUNFRC >2.20*  --   --   --  1.38* 1.05*  CREATININE 1.02*  --   --   --  0.79 0.77   < > = values in this interval not displayed.    Estimated Creatinine Clearance: 86.9 mL/min (by C-G formula based on SCr of 0.77 mg/dL). Assessment: 73 y.o. female with h/o Afib, Eliquis on hold for possible diverticular abcess/drain placement.   PTA Eliquis 5mg  BID; last dose 7/17 @ 0900  PTT therapeutic  H&H and platelets WNL; no signs of bleeding  Goal of Therapy:  APTT 66-102 Heparin level 0.3-0.7 units/ml Monitor platelets by anticoagulation protocol: Yes   Plan:  -Continue heparin drip at 1400 units/hr -Monitor daily aPTT, heparin level, and CBC -Monitor for signs and symptoms of bleeding -F/u surgical plan and plan to switch back to oral anticoagulation  Thank you Anette Guarneri, PharmD [phone] 12/26/2018,8:35 AM

## 2018-12-26 NOTE — Care Management Important Message (Signed)
Important Message  Patient Details  Name: [name] MRN: [id number] Date of Birth: May 13, 1946   Medicare Important Message Given:  Yes     Arnetha Silverthorne 12/26/2018, 1:20 PM

## 2018-12-26 NOTE — Progress Notes (Signed)
Patient ID: [name], female   DOB: January 04, 1946, 73 y.o.   MRN: [id number]  PROGRESS NOTE    [name]  PRF:[id number] DOB: January 22, 1946 DOA: 12/23/2018 PCP: Leonard Downing, MD   Brief Narrative:  73 year old female with history of atrial fibrillation, hypertension, popliteal artery dissection status post surgery, recent treatment for diverticulitis with possible microperforation in [place] and discharged 5 days prior to presentation presented with fever and abdominal soreness.  Initial CT scan showed acute sigmoid diverticulitis with possible developing abscess.  General surgery was consulted.  Patient was started on broad-spectrum antibiotics.  IR was also consulted but  drain could not be placed because it was too small to place drain as per IR.  Assessment & Plan:   Acute sigmoid diverticulitis with possible developing abscess -Patient was recently treated for diverticulitis with possible microperforation in [place] and discharged 5 days prior to presentation -Currently on Zosyn. General surgery following; diet as per general surgery recommendations -Cultures negative so far -IR was also consulted but  drain could not be placed because it was too small to place drain as per IR. -Pain management -Afebrile.  Currently hemodynamically stable without worsening leukocytosis.  Paroxysmal atrial fibrillation -Eliquis on hold.  Continue heparin drip.  Currently rate controlled.  Continue Cardizem  Hypertension -Blood pressure stable.  Monitor  Acute kidney injury -Improved.  Hypokalemia  -Replace.  Repeat a.m. labs   DVT prophylaxis: Heparin drip Code Status: Full Family Communication: Discussed with patient  disposition Plan: Home in 1-2 days once cleared by general surgery  Consultants: General surgery Procedures: None  Antimicrobials: Zosyn from 12/23/2018 onwards   Subjective: Patient seen and examined at bedside.   Denies overnight fever, vomiting, worsening shortness of breath.  Denies worsening abdominal pain as well.   Objective: Vitals:   12/25/18 0437 12/25/18 0843 12/25/18 2300 12/26/18 0300  BP: (!) 113/57 (!) 113/58  136/73  Pulse: 71 67  63  Resp: 16 18  16   Temp: 98.4 F (36.9 C) 98.6 F (37 C)  98.2 F (36.8 C)  TempSrc: Oral Oral  Oral  SpO2: 93% 98%  95%  Weight: 121 kg  120.7 kg   Height:        Intake/Output Summary (Last 24 hours) at 12/26/2018 0754 Last data filed at 12/26/2018 0600 Gross per 24 hour  Intake 2610.42 ml  Output 1200 ml  Net 1410.42 ml   Filed Weights   12/24/18 1300 12/25/18 0437 12/25/18 2300  Weight: 121.6 kg 121 kg 120.7 kg    Examination:  General exam: No acute distress Respiratory system: Bilateral decreased breath sounds at bases.  No wheezing Cardiovascular system: Rate controlled, S1-S2 heard Gastrointestinal system: Abdomen is nondistended, soft and mildly tender in the lower quadrant. Normal bowel sounds heard. Extremities: No cyanosis, edema      Data Reviewed: I have personally reviewed following labs and imaging studies  CBC: Recent Labs  Lab 12/23/18 1627 12/24/18 0211 12/25/18 0207 12/26/18 0420  WBC 16.3* 11.3* 9.5 7.2  NEUTROABS  --  8.0*  --   --   HGB 14.2 14.2 12.6 12.8  HCT 43.4 43.4 38.3 39.7  MCV 93.9 92.7 92.7 94.3  PLT 333 243 267 935   Basic Metabolic Panel: Recent Labs  Lab 12/23/18 1627 12/24/18 0211 12/25/18 0207 12/26/18 0420  NA 139 138 140 141  K 3.2* 3.1* 2.9* 3.4*  CL 103 106 106 109  CO2 26 20* 26 22  GLUCOSE  132* 112* 100* 99  BUN 21 18 7* 5*  CREATININE 1.14* 1.02* 0.79 0.77  CALCIUM 9.7 9.1 8.5* 8.6*  MG  --   --  1.7 1.7   GFR: Estimated Creatinine Clearance: 86.9 mL/min (by C-G formula based on SCr of 0.77 mg/dL). Liver Function Tests: Recent Labs  Lab 12/23/18 1627 12/24/18 0211 12/25/18 0207  AST 23 22 18   ALT 33 27 20  ALKPHOS 59 54 48  BILITOT 0.6 0.6 0.5  PROT 6.0*  5.5* 5.1*  ALBUMIN 3.2* 3.0* 2.6*   Recent Labs  Lab 12/23/18 1627  LIPASE 23   No results for input(s): AMMONIA in the last 168 hours. Coagulation Profile: Recent Labs  Lab 12/24/18 0545  INR 1.3*   Cardiac Enzymes: No results for input(s): CKTOTAL, CKMB, CKMBINDEX, TROPONINI in the last 168 hours. BNP (last 3 results) No results for input(s): PROBNP in the last 8760 hours. HbA1C: No results for input(s): HGBA1C in the last 72 hours. CBG: Recent Labs  Lab 12/24/18 0708 12/24/18 1137 12/24/18 1635 12/25/18 0646  GLUCAP 122* 89 127* 88   Lipid Profile: No results for input(s): CHOL, HDL, LDLCALC, TRIG, CHOLHDL, LDLDIRECT in the last 72 hours. Thyroid Function Tests: No results for input(s): TSH, T4TOTAL, FREET4, T3FREE, THYROIDAB in the last 72 hours. Anemia Panel: No results for input(s): VITAMINB12, FOLATE, FERRITIN, TIBC, IRON, RETICCTPCT in the last 72 hours. Sepsis Labs: No results for input(s): PROCALCITON, LATICACIDVEN in the last 168 hours.  Recent Results (from the past 240 hour(s))  SARS Coronavirus 2 (CEPHEID - Performed in [place] hospital lab), Hosp Order     Status: None   Collection Time: 12/24/18 12:20 AM   Specimen: Nasopharyngeal Swab  Result Value Ref Range Status   SARS Coronavirus 2 NEGATIVE NEGATIVE Final    Comment: (NOTE) If result is NEGATIVE SARS-CoV-2 target nucleic acids are NOT DETECTED. The SARS-CoV-2 RNA is generally detectable in upper and lower  respiratory specimens during the acute phase of infection. The lowest  concentration of SARS-CoV-2 viral copies this assay can detect is 250  copies / mL. A negative result does not preclude SARS-CoV-2 infection  and should not be used as the sole basis for treatment or other  patient management decisions.  A negative result may occur with  improper specimen collection / handling, submission of specimen other  than nasopharyngeal swab, presence of viral mutation(s) within the  areas  targeted by this assay, and inadequate number of viral copies  (<250 copies / mL). A negative result must be combined with clinical  observations, patient history, and epidemiological information. If result is POSITIVE SARS-CoV-2 target nucleic acids are DETECTED. The SARS-CoV-2 RNA is generally detectable in upper and lower  respiratory specimens dur ing the acute phase of infection.  Positive  results are indicative of active infection with SARS-CoV-2.  Clinical  correlation with patient history and other diagnostic information is  necessary to determine patient infection status.  Positive results do  not rule out bacterial infection or co-infection with other viruses. If result is PRESUMPTIVE POSTIVE SARS-CoV-2 nucleic acids MAY BE PRESENT.   A presumptive positive result was obtained on the submitted specimen  and confirmed on repeat testing.  While 2019 novel coronavirus  (SARS-CoV-2) nucleic acids may be present in the submitted sample  additional confirmatory testing may be necessary for epidemiological  and / or clinical management purposes  to differentiate between  SARS-CoV-2 and other Sarbecovirus currently known to infect humans.  If clinically  indicated additional testing with an alternate test  methodology [phone]) is advised. The SARS-CoV-2 RNA is generally  detectable in upper and lower respiratory sp ecimens during the acute  phase of infection. The expected result is Negative. Fact Sheet for Patients:  StrictlyIdeas.no Fact Sheet for Healthcare Providers: BankingDealers.co.za This test is not yet approved or cleared by the [place] FDA and has been authorized for detection and/or diagnosis of SARS-CoV-2 by FDA under an Emergency Use Authorization (EUA).  This EUA will remain in effect (meaning this test can be used) for the duration of the COVID-19 declaration under Section 564(b)(1) of the Act, 21 U.S.C. section  360bbb-3(b)(1), unless the authorization is terminated or revoked sooner. Performed at [facility], [place] [address], [place],  [address]          Radiology Studies: No results found.      Scheduled Meds: . diltiazem  240 mg Oral Daily  . potassium chloride  40 mEq Oral Once  . sodium chloride flush  3 mL Intravenous Once   Continuous Infusions: . dextrose 5 % and 0.9% NaCl 50 mL/hr at 12/25/18 1300  . heparin 1,400 Units/hr (12/25/18 1503)  . piperacillin-tazobactam (ZOSYN)  IV 3.375 g (12/25/18 2315)     LOS: 3 days        Aline August, MD [facility] 12/26/2018, 7:54 AM

## 2018-12-27 LAB — CBC
HCT: 39.8 % (ref 36.0–46.0)
Hemoglobin: 13.1 g/dL (ref 12.0–15.0)
MCH: 30.5 pg (ref 26.0–34.0)
MCHC: 32.9 g/dL (ref 30.0–36.0)
MCV: 92.6 fL (ref 80.0–100.0)
Platelets: 252 10*3/uL (ref 150–400)
RBC: 4.3 MIL/uL (ref 3.87–5.11)
RDW: 13.8 % (ref 11.5–15.5)
WBC: 6.9 10*3/uL (ref 4.0–10.5)
nRBC: 0 % (ref 0.0–0.2)

## 2018-12-27 LAB — BASIC METABOLIC PANEL
Anion gap: 7 (ref 5–15)
BUN: 7 mg/dL — ABNORMAL LOW (ref 8–23)
CO2: 26 mmol/L (ref 22–32)
Calcium: 8.8 mg/dL — ABNORMAL LOW (ref 8.9–10.3)
Chloride: 109 mmol/L (ref 98–111)
Creatinine, Ser: 0.88 mg/dL (ref 0.44–1.00)
GFR calc Af Amer: >60 mL/min (ref >60)
GFR calc non Af Amer: >60 mL/min (ref >60)
Glucose, Bld: 121 mg/dL — ABNORMAL HIGH (ref 70–99)
Potassium: 3.7 mmol/L (ref 3.5–5.1)
Sodium: 142 mmol/L (ref 135–145)

## 2018-12-27 LAB — MAGNESIUM: Magnesium: 1.8 mg/dL (ref 1.7–2.4)

## 2018-12-27 LAB — HEPARIN LEVEL (UNFRACTIONATED): Heparin Unfractionated: 0.52 IU/mL (ref 0.30–0.70)

## 2018-12-27 LAB — APTT: aPTT: 74 seconds — ABNORMAL HIGH (ref 24–36)

## 2018-12-27 MED ORDER — APIXABAN 5 MG PO TABS
5.0000 mg | ORAL_TABLET | Freq: Two times a day (BID) | ORAL | Status: DC
  Administered 2018-12-27: 5 mg via ORAL

## 2018-12-27 MED ORDER — FLUCONAZOLE 150 MG PO TABS
150.0000 mg | ORAL_TABLET | Freq: Once | ORAL | Status: AC
  Administered 2018-12-27: 150 mg via ORAL
  Filled 2018-12-27: qty 1

## 2018-12-27 MED ORDER — AMOXICILLIN-POT CLAVULANATE 875-125 MG PO TABS: 1.0000 | ORAL_TABLET | Freq: Two times a day (BID) | ORAL | 0 refills | Status: AC

## 2018-12-27 NOTE — Progress Notes (Signed)
Rich Number to be discharged Home per MD order. Discussed prescriptions and follow up appointments with the patient. Prescriptions given to patient; medication list explained in detail. Patient verbalized understanding.  Skin clean, dry and intact without evidence of skin break down, no evidence of skin tears noted. IV catheter discontinued intact. Site without signs and symptoms of complications. Dressing and pressure applied. Pt denies pain at the site currently. No complaints noted.  Patient free of lines, drains, and wounds.   An After Visit Summary (AVS) was printed and given to the patient. Patient escorted via wheelchair, and discharged home via private auto.  Shela Commons, RN

## 2018-12-27 NOTE — Discharge Instructions (Signed)
Diverticulitis  Diverticulitis is when small pockets in your large intestine (colon) get infected or swollen. This causes stomach pain and watery poop (diarrhea). These pouches are called diverticula. They form in people who have a condition called diverticulosis. Follow these instructions at home: Medicines  Take over-the-counter and prescription medicines only as told by your doctor. These include: ? Antibiotics. ? Pain medicines. ? Fiber pills. ? Probiotics. ? Stool softeners.  Do not drive or use heavy machinery while taking prescription pain medicine.  If you were prescribed an antibiotic, take it as told. Do not stop taking it even if you feel better. General instructions   Follow a diet as told by your doctor.  When you feel better, your doctor may tell you to change your diet. You may need to eat a lot of fiber. Fiber makes it easier to poop (have bowel movements). Healthy foods with fiber include: ? Berries. ? Beans. ? Lentils. ? Green vegetables.  Exercise 3 or more times a week. Aim for 30 minutes each time. Exercise enough to sweat and make your heart beat faster.  Keep all follow-up visits as told. This is important. You may need to have an exam of the large intestine. This is called a colonoscopy. Contact a doctor if:  Your pain does not get better.  You have a hard time eating or drinking.  You are not pooping like normal. Get help right away if:  Your pain gets worse.  Your problems do not get better.  Your problems get worse very fast.  You have a fever.  You throw up (vomit) more than one time.  You have poop that is: ? Bloody. ? Black. ? Tarry. Summary  Diverticulitis is when small pockets in your large intestine (colon) get infected or swollen.  Take medicines only as told by your doctor.  Follow a diet as told by your doctor. This information is not intended to replace advice given to you by your health care provider. Make sure you  discuss any questions you have with your health care provider. Document Released: 11/11/2007 Document Revised: 05/07/2017 Document Reviewed: 06/11/2016 Elsevier Patient Education  [place] x6-8 weeks Fiber is found in fruits, vegetables, whole grains, and beans. Eating a diet low in fiber helps to reduce how often you have bowel movements and how much you produce during a bowel movement. A low-fiber eating plan may help your digestive system heal if:  You have certain conditions, such as Crohn's disease or diverticulitis.  You recently had radiation therapy on your pelvis or bowel.  You recently had intestinal surgery.  You have a new surgical opening in your abdomen (colostomy or ileostomy).  Your intestine is narrowed (stricture). Your health care provider will determine how long you need to stay on this diet. Your health care provider may recommend that you work with a diet and nutrition specialist (dietitian). What are tips for following this plan? General guidelines  Follow recommendations from your dietitian about how much fiber you should have each day.  Most people on this eating plan should try to eat less than 10 grams (g) of fiber each day. Your daily fiber goal is _________________ g.  Take vitamin and mineral supplements as told by your health care provider or dietitian. Chewable or liquid forms are best when on this eating plan. Reading food labels  Check food labels for the amount of dietary fiber.  Choose foods that have less than 2 grams  of fiber in one serving. Cooking  Use white flour and other allowed grains for baking and cooking.  Cook meat using methods that keep it tender, such as braising or poaching.  Cook eggs until the yolk is completely solid.  Cook with healthy oils, such as olive oil or canola oil. Meal planning   Eat 5-6 small meals throughout the day instead of 3 large meals.  If you are lactose  intolerant: ? Choose low-lactose dairy foods. ? Do not eat dairy foods, if told by your dietitian.  Limit fat and oils to less than 8 teaspoons a day.  Eat small portions of desserts. What foods are allowed? The items listed below may not be a complete list. Talk with your dietitian about what dietary choices are best for you. Grains All bread and crackers made with white flour. Waffles, pancakes, and [place] toast. Bagels. Pretzels. Melba toast, zwieback, and matzoh. Cooked and dried cereals that do not contain whole grains, added fiber, seeds, or dried fruit. CornmealDomenick Gong. Hot and cold cereals made with refined corn, wheat, rice, or oats. Plain pasta and noodles. White rice. Vegetables Well-cooked or canned vegetables without skin, seeds, or stems. Cooked potatoes without skins. Vegetable juice. Fruits Soft-cooked or canned fruits without skin and seeds. Peeled ripe banana. Applesauce. Fruit juice without pulp. Meats and other protein foods Ground meat. Tender cuts of meat or poultry. Eggs. Fish, seafood, and shellfish. Smooth nut butters. Tofu. Dairy All milk products and drinks. Lactose-free milks, including rice, soy, and almond milks. Yogurt without fruit, nuts, chocolate, or granola mix-ins. Sour cream. Cottage cheese. Cheese. Beverages Decaf coffee. Fruit and vegetable juices or smoothies (in small amounts, with no pulp or skins, and with fruits from allowed list). Sports drinks. Herbal tea. Fats and oils Olive oil, canola oil, sunflower oil, flaxseed oil, and grapeseed oil. Mayonnaise. Cream cheese. Margarine. Butter. Sweets and desserts Plain cakes and cookies. Cream pies and pies made with allowed fruits. Pudding. Custard. Fruit gelatin. Sherbet. Popsicles. Ice cream without nuts. Plain hard candy. Honey. Jelly. Molasses. Syrups, including chocolate syrup. Chocolate. Marshmallows. Gumdrops. Seasoning and other foods Bouillon. Broth. Cream soups made from allowed foods.  Strained soup. Casseroles made with allowed foods. Ketchup. Mild mustard. Mild salad dressings. Plain gravies. Vinegar. Spices in moderation. Salt. Sugar. What foods are not allowed? The items listed below may not be a complete list. Talk with your dietitian about what dietary choices are best for you. Grains Whole wheat and whole grain breads and crackers. Multigrain breads and crackers. Rye bread. Whole grain or multigrain cereals. Cereals with nuts, raisins, or coconut. Bran. Coarse wheat cereals. Granola. High-fiber cereals. Cornmeal or corn bread. Whole grain pasta. Wild or brown rice. Quinoa. Popcorn. Buckwheat. Wheat germ. Vegetables Potato skins. Raw or undercooked vegetables. All beans and bean sprouts. Cooked greens. Corn. Peas. Cabbage. Beets. Broccoli. Brussels sprouts. Cauliflower. Mushrooms. Onions. Peppers. Parsnips. Okra. Sauerkraut. Fruit Raw or dried fruit. Berries. Fruit juice with pulp. Prune juice. Meats and other protein foods Tough, fibrous meats with gristle. Fatty meat. Poultry with skin. Fried meat, Sales executive, or fish. Deli or lunch meats. Sausage, bacon, and hot dogs. Nuts and chunky nut butter. Dried peas, beans, and lentils. Dairy Yogurt with fruit, nuts, chocolate, or granola mix-ins. Beverages Caffeinated coffee and teas. Fats and oils Avocado. Coconut. Sweets and desserts Desserts, cookies, or candies that contain nuts or coconut. Dried fruit. Jams and preserves with seeds. Marmalade. Any dessert made with fruits or grains that are not allowed. Seasoning and  other foods Corn tortilla chips. Soups made with vegetables or grains that are not allowed. Relish. Horseradish. Angie Fava. Olives. Summary  Most people on a low-fiber eating plan should eat less than 10 grams of fiber a day. Follow recommendations from your dietitian about how much fiber you should have each day.  Always check food labels to see the dietary fiber content of packaged foods. In general, a  low-fiber food will have fewer than 2 grams of fiber per serving.  In general, try to avoid whole grains, raw fruits and vegetables, dried fruit, tough cuts of meat, nuts, and seeds.  Take a vitamin and mineral supplement as told by your health care provider or dietitian. This information is not intended to replace advice given to you by your health care provider. Make sure you discuss any questions you have with your health care provider. Document Released: 11/14/2001 Document Revised: 09/16/2018 Document Reviewed: 07/28/2016 Elsevier Patient Education  2020 [place].   High-Fiber Diet to follow low fiber diet Fiber, also called dietary fiber, is a type of carbohydrate that is found in fruits, vegetables, whole grains, and beans. A high-fiber diet can have many health benefits. Your health care provider may recommend a high-fiber diet to help:  Prevent constipation. Fiber can make your bowel movements more regular.  Lower your cholesterol.  Relieve the following conditions: ? Swelling of veins in the anus (hemorrhoids). ? Swelling and irritation (inflammation) of specific areas of the digestive tract (uncomplicated diverticulosis). ? A problem of the large intestine (colon) that sometimes causes pain and diarrhea (irritable bowel syndrome, IBS).  Prevent overeating as part of a weight-loss plan.  Prevent heart disease, type 2 diabetes, and certain cancers. What is my plan? The recommended daily fiber intake in grams (g) includes:  38 g for men age 87 or younger.  30 g for men over age 1.  73 g for women age 78 or younger.  21 g for women over age 63. You can get the recommended daily intake of dietary fiber by:  Eating a variety of fruits, vegetables, grains, and beans.  Taking a fiber supplement, if it is not possible to get enough fiber through your diet. What do I need to know about a high-fiber diet?  It is better to get fiber through food sources rather than from  fiber supplements. There is not a lot of research about how effective supplements are.  Always check the fiber content on the nutrition facts label of any prepackaged food. Look for foods that contain 5 g of fiber or more per serving.  Talk with a diet and nutrition specialist (dietitian) if you have questions about specific foods that are recommended or not recommended for your medical condition, especially if those foods are not listed below.  Gradually increase how much fiber you consume. If you increase your intake of dietary fiber too quickly, you may have bloating, cramping, or gas.  Drink plenty of water. Water helps you to digest fiber. What are tips for following this plan?  Eat a wide variety of high-fiber foods.  Make sure that half of the grains that you eat each day are whole grains.  Eat breads and cereals that are made with whole-grain flour instead of refined flour or white flour.  Eat brown rice, bulgur wheat, or millet instead of white rice.  Start the day with a breakfast that is high in fiber, such as a cereal that contains 5 g of fiber or more per serving.  Use beans in place of meat in soups, salads, and pasta dishes.  Eat high-fiber snacks, such as berries, raw vegetables, nuts, and popcorn.  Choose whole fruits and vegetables instead of processed forms like juice or sauce. What foods can I eat?  Fruits Berries. Pears. Apples. Oranges. Avocado. Prunes and raisins. Dried figs. Vegetables Sweet potatoes. Spinach. Kale. Artichokes. Cabbage. Broccoli. Cauliflower. Green peas. Carrots. Squash. Grains Whole-grain breads. Multigrain cereal. Oats and oatmeal. Brown rice. Barley. Bulgur wheat. [place]. Quinoa. Bran muffins. Popcorn. Rye wafer crackers. Meats and other proteins [place], kidney, and pinto beans. Soybeans. Split peas. Lentils. Nuts and seeds. Dairy Fiber-fortified yogurt. Beverages Fiber-fortified soy milk. Fiber-fortified orange juice. Other  foods Fiber bars. The items listed above may not be a complete list of recommended foods and beverages. Contact a dietitian for more options. What foods are not recommended? Fruits Fruit juice. Cooked, strained fruit. Vegetables Fried potatoes. Canned vegetables. Well-cooked vegetables. Grains White bread. Pasta made with refined flour. White rice. Meats and other proteins Fatty cuts of meat. Fried chicken or fried fish. Dairy Milk. Yogurt. Cream cheese. Sour cream. Fats and oils Butters. Beverages Soft drinks. Other foods Cakes and pastries. The items listed above may not be a complete list of foods and beverages to avoid. Contact a dietitian for more information. Summary  Fiber is a type of carbohydrate. It is found in fruits, vegetables, whole grains, and beans.  There are many health benefits of eating a high-fiber diet, such as preventing constipation, lowering blood cholesterol, helping with weight loss, and reducing your risk of heart disease, diabetes, and certain cancers.  Gradually increase your intake of fiber. Increasing too fast can result in cramping, bloating, and gas. Drink plenty of water while you increase your fiber.  The best sources of fiber include whole fruits and vegetables, whole grains, nuts, seeds, and beans. This information is not intended to replace advice given to you by your health care provider. Make sure you discuss any questions you have with your health care provider. Document Released: 05/25/2005 Document Revised: 03/29/2017 Document Reviewed: 03/29/2017 Elsevier Patient Education  2020 [employer].

## 2018-12-27 NOTE — Plan of Care (Signed)
  Problem: Skin Integrity: Goal: Risk for impaired skin integrity will decrease Outcome: Progressing   

## 2018-12-27 NOTE — Discharge Summary (Signed)
Physician Discharge Summary  [name] MGQ:[id number] DOB: 06-Oct-1945 DOA: 12/23/2018  PCP: Leonard Downing, MD  Admit date: 12/23/2018 Discharge date: 12/27/2018  Admitted From: Home Disposition: Home  Recommendations for Outpatient Follow-up:  1. Follow up with PCP in 1 week with repeat CBC/BMP 2. Outpatient follow-up with general surgery 3. Follow up in ED if symptoms worsen or new appear   Home Health: No Equipment/Devices: None  Discharge Condition: Stable CODE STATUS: Full Diet recommendation: Low fiber/heart healthy  Brief/Interim Summary: 73 year old female with history of atrial fibrillation, hypertension, popliteal artery dissection status post surgery, recent treatment for diverticulitis with possible microperforation in [place] and discharged 5 days prior to presentation presented with fever and abdominal soreness.  Initial CT scan showed acute sigmoid diverticulitis with possible developing abscess.  General surgery was consulted.  Patient was started on broad-spectrum antibiotics.  IR was also consulted but  drain could not be placed because it was too small to place drain as per IR.  During the hospitalization, condition has improved.  General surgery has gradually advance her diet.  She is afebrile with no leukocytosis.  General surgery has cleared the patient for discharge on oral antibiotics.  Discharge Diagnoses:   Acute sigmoid diverticulitis with possible developing abscess -Patient was recently treated for diverticulitis with possible microperforation in [place] and discharged 5 days prior to presentation -Currently on Zosyn. General surgery following; diet has been gradually advanced by general surgery and patient is tolerating diet. -Cultures negative so far -IR was also consulted but  drain could not be placed because it was too small to place drain as per IR. -Afebrile.  Currently hemodynamically stable without  worsening leukocytosis. -Neurosurgery has cleared the patient for discharge on oral Augmentin for 7 more days.  Outpatient follow-up with general surgery.  Continue low fiber diet.  Paroxysmal atrial fibrillation -Currently on heparin drip.  We will switch back to oral Eliquis upon discharge. Currently rate controlled.  Continue Cardizem  Hypertension -Blood pressure stable.  Monitor  Acute kidney injury -Improved.  Hypokalemia  -Improved.  Discharge Instructions  Discharge Instructions    Diet - low sodium heart healthy   Complete by: As directed    Increase activity slowly   Complete by: As directed      Allergies as of 12/27/2018      Reactions   Sulfa Antibiotics Other (See Comments)   Turned eyes yellow    Sulfonamide Derivatives Rash   Rash   Atenolol Cough   Erythromycin    Nausea   Erythromycin Base Nausea Only   Lactose Intolerance (gi) Diarrhea   Just MILK    Plavix [clopidogrel Bisulfate] Cough   cough      Medication List    STOP taking these medications   ciprofloxacin 500 MG tablet Commonly known as: CIPRO   metroNIDAZOLE 500 MG tablet Commonly known as: FLAGYL     TAKE these medications   amoxicillin-clavulanate 875-125 MG tablet Commonly known as: Augmentin Take 1 tablet by mouth 2 (two) times daily for 7 days.   aspirin EC 81 MG tablet Take 81 mg by mouth See admin instructions. Nancy Fetter, tues, thur, sat   B COMPLEX PO Take 1 tablet by mouth every evening.   diltiazem 240 MG 24 hr capsule Commonly known as: CARDIZEM CD TAKE 1 CAPSULE BY MOUTH DAILY.   Eliquis 5 MG Tabs tablet Generic drug: apixaban TAKE 1 TABLET BY MOUTH 2 TIMES DAILY. What changed: how much to take  GNP CALCIUM PLUS 600 +D PO Take 1 tablet by mouth every other day.   loratadine 10 MG tablet Commonly known as: CLARITIN Take 10 mg by mouth daily.   mometasone 50 MCG/ACT nasal spray Commonly known as: NASONEX Place 1 spray into the nose daily as needed for  congestion.   multivitamin per tablet Take 1 tablet by mouth daily.   OMEPRAZOLE PO Take 20 mg by mouth every evening.   Pataday 0.2 % Soln Generic drug: Olopatadine HCl Place 1 drop into both eyes daily.   potassium chloride 10 MEQ tablet Commonly known as: K-DUR TAKE 1 TABLET BY MOUTH DAILY. What changed: when to take this   triamterene-hydrochlorothiazide 75-50 MG tablet Commonly known as: MAXZIDE Take 1 tablet by mouth daily.      Follow-up Information    Ileana Roup, MD Follow up in 1 month(s).   Specialty: General Surgery Why: Our office will call you with appointment date and time Contact information: [place] [address] [phone]        Leonard Downing, MD. Schedule an appointment as soon as possible for a visit in 1 week(s).   Specialty: Family Medicine Contact information: [place] [address] [phone]        Nahser, Wonda Cheng, MD .   Specialty: Cardiology Contact information: [address] [place] [address] [phone]          Allergies  Allergen Reactions  . Sulfa Antibiotics Other (See Comments)    Turned eyes yellow   . Sulfonamide Derivatives Rash    Rash  . Atenolol Cough  . Erythromycin     Nausea  . Erythromycin Base Nausea Only  . Lactose Intolerance (Gi) Diarrhea    Just MILK   . Plavix [Clopidogrel Bisulfate] Cough    cough    Consultations:  General surgery   Procedures/Studies: Ct Abdomen Pelvis W Contrast  Result Date: 12/23/2018 CLINICAL DATA:  Prior hospital admission for diverticulitis with colonic rupture. Persistent low-grade fever. EXAM: CT ABDOMEN AND PELVIS WITH CONTRAST TECHNIQUE: Multidetector CT imaging of the abdomen and pelvis was performed using the standard protocol following bolus administration of intravenous contrast. CONTRAST:  188mL OMNIPAQUE IOHEXOL 300 MG/ML  SOLN COMPARISON:  None. FINDINGS: Lower chest: Basilar  atelectatic changes and areas of bronchiectasis with architectural distortion. Mild cardiomegaly. No pericardial effusion atherosclerotic calcification of the coronary arteries. Aortic leaflet calcifications are present. Hepatobiliary: No focal liver abnormality is seen. Patient is post cholecystectomy. Slight prominence of the biliary tree likely related to reservoir effect. No calcified intraductal gallstones. Pancreas: Fatty replacement of the pancreas. No pancreatic ductal dilatation or surrounding inflammatory changes. Spleen: Normal in size without focal abnormality. Accessory hilar splenule. Adrenals/Urinary Tract: Simple fluid attenuation cyst in the upper pole left kidney measuring up to 4.9 cm. No concerning renal masses. No hydronephrosis. Mild asymmetric bladder wall thickening most pronounced along the left superolateral dome, likely reactive. Stomach/Bowel: Small hiatal hernia. Partially calcified ovoid projection from the greater curvature of the stomach. Duodenal sweep is unremarkable. No small bowel thickening or dilatation. No obstruction. High-density material within the tip of the otherwise normal appendix. There is focal segmental thickening and pericolonic inflammation centered upon a diverticular outpouching in the proximal sigmoid colon (coronal 85, axial 80) with adjacent phlegmonous change with a developing air and fluid containing rim enhancing collection along the superolateral left bladder dome. No resultant obstruction. Vascular/Lymphatic: Atherosclerotic plaque within the normal caliber aorta. Few  scattered reactive appearing mesenteric lymph nodes. No pathologically enlarged nodes. Reproductive: Uterus is surgically absent. No concerning adnexal lesions. Other: Rim enhancing collection in the low left pelvis, detailed above. No other foci of free fluid or gas. Pelvic floor laxity. Musculoskeletal: Pelvic floor laxity. No acute osseous abnormality or suspicious osseous lesion.  Multilevel degenerative changes are present in the imaged portions of the spine. At least canal stenosis and moderate foraminal stenosis on the right at L3-4. IMPRESSION: Findings consistent with acute sigmoid diverticulitis with a developing air and fluid containing collection along the superolateral left bladder dome, consistent with abscess. No resultant obstruction. Asymmetric bladder thickening is likely reactive though could consider urinalysis if urinary symptoms are present. Partially calcified outpouching from the greater curvature of the stomach. Non-specific but could reflect prior gastric diverticula or remote contain ulcer perforation. Malignant etiology is considered less likely. Hiatal hernia. Aortic Atherosclerosis (ICD10-I70.0). Pelvic floor laxity. These results were called by telephone at the time of interpretation on 12/23/2018 at 10:46 pm to Dr. Stark Jock, who verbally acknowledged these results. Electronically Signed   By: Lovena Le M.D.   On: 12/23/2018 22:46   Dg Bone Density  Result Date: 12/01/2018 EXAM: DUAL X-RAY ABSORPTIOMETRY (DXA) FOR BONE MINERAL DENSITY IMPRESSION: Referring Physician:  Talbert Cage Your patient completed a BMD test using Lunar IDXA DXA system ( analysis version: 16 ) manufactured by [employer]. Technologist: AW PATIENT: Name: [name], [name] Patient ID: [id number] Birth Date: 1946-03-01 Height:     68.0 in. Sex: Female Measured: 12/01/2018 Weight:     274.6 lbs. Indications: Advanced Age, Caucasian, Estrogen Deficient, Postmenopausal Fractures: Foot Treatments: ASSESSMENT: The BMD measured at Femur Neck Right is 0.824 g/cm2 with a T-score of -1.5. This patient is considered OSTEOPENIC according to [facility]) criteria. There has been a statistically significant decrease in BMD of Left hip since prior exam dated 02/03/2012. The scan quality is good. Lumbar spine was not utilized due to being excluded in prior exam. Prior DXA exam  performed on Hologic device measures only unilateral hip (not Total Mean Hip). Therefore, current Total Mean Hip  cannot be compared to prior exam. Site Region Measured Date Measured Age YA T-score BMD Significant CHANGE DualFemur Neck Right 12/01/2018 72.2 -1.5 0.824 g/cm2 DualFemur Total Left 12/01/2018 72.2 -1.2 0.853 g/cm2 * DualFemur Total Left 02/03/2012 65.4 -0.5 0.940 g/cm2 * Left Forearm Radius 33% 12/01/2018 72.2 0.0 0.890 g/cm2 World Health Organization [facility]) criteria for post-menopausal, Caucasian Women: Normal       T-score at or above -1 SD Osteopenia   T-score between -1 and -2.5 SD Osteoporosis T-score at or below -2.5 SD RECOMMENDATION: 1. All patients should optimize calcium and vitamin D intake. 2. Consider FDA approved medical therapies in postmenopausal women and men aged 77 years and older, based on the following: a. A hip or vertebral (clinical or morphometric) fracture b. T- score < or = -2.5 at the femoral neck or spine after appropriate evaluation to exclude secondary causes c. Low bone mass (T-score between -1.0 and -2.5 at the femoral neck or spine) and a 10 year probability of a hip fracture > or = 3% or a 10 year probability of a major osteoporosis-related fracture > or = 20% based on the [place]-adapted WHO algorithm d. Clinician judgment and/or patient preferences may indicate treatment for people with 10-year fracture probabilities above or below these levels FOLLOW-UP: Patients with diagnosis of osteoporosis or at high risk for fracture should have regular bone mineral density  tests. For patients eligible for Medicare, routine testing is allowed once every 2 years. The testing frequency can be increased to one year for patients who have rapidly progressing disease, those who are receiving or discontinuing medical therapy to restore bone mass, or have additional risk factors. I have reviewed this report and agree with the above findings. Mark A. Thornton Papas, M.D.  Radiology FRAX*  10-year Probability of Fracture Based on femoral neck BMD: DualFemur (Right) Major Osteoporotic Fracture: 9.3% Hip Fracture:                1.4% Population:                  [place] (Caucasian) Risk Factors:                None *FRAX is a Materials engineer of the [employer] for Metabolic Bone Disease, a World Pharmacologist (WHO) [facility]. ASSESSMENT: The probability of a major osteoporotic fracture is 9.3 % within the next ten years. The probability of a hip fracture is 1.4 % within the next ten years. I have reviewed this report and agree with the above findings. Mark A. Thornton Papas, M.D. [facility] Radiology Electronically Signed   By: Lavonia Dana M.D.   On: 12/01/2018 14:02       Subjective: Patient seen and examined at bedside.  She only complains of mild lower abdomen/pelvic pain.  No overnight fever or vomiting.  Feels that she is okay to go home today.  Discharge Exam: Vitals:   12/26/18 2041 12/27/18 0454  BP: 120/62 119/76  Pulse: 69 63  Resp: 18 18  Temp: 98.9 F (37.2 C) 98.6 F (37 C)  SpO2: 96% 97%    General exam: No acute distress Respiratory system: Bilateral decreased breath sounds at bases.  No wheezing Cardiovascular system: Rate controlled, S1-S2 heard Gastrointestinal system: Abdomen is nondistended, soft and mildly tender in the lower quadrant. Normal bowel sounds heard. Extremities: No cyanosis, edema       The results of significant diagnostics from this hospitalization (including imaging, microbiology, ancillary and laboratory) are listed below for reference.     Microbiology: Recent Results (from the past 240 hour(s))  SARS Coronavirus 2 (CEPHEID - Performed in [place] hospital lab), Hosp Order     Status: None   Collection Time: 12/24/18 12:20 AM   Specimen: Nasopharyngeal Swab  Result Value Ref Range Status   SARS Coronavirus 2 NEGATIVE NEGATIVE Final    Comment: (NOTE) If result is NEGATIVE SARS-CoV-2  target nucleic acids are NOT DETECTED. The SARS-CoV-2 RNA is generally detectable in upper and lower  respiratory specimens during the acute phase of infection. The lowest  concentration of SARS-CoV-2 viral copies this assay can detect is 250  copies / mL. A negative result does not preclude SARS-CoV-2 infection  and should not be used as the sole basis for treatment or other  patient management decisions.  A negative result may occur with  improper specimen collection / handling, submission of specimen other  than nasopharyngeal swab, presence of viral mutation(s) within the  areas targeted by this assay, and inadequate number of viral copies  (<250 copies / mL). A negative result must be combined with clinical  observations, patient history, and epidemiological information. If result is POSITIVE SARS-CoV-2 target nucleic acids are DETECTED. The SARS-CoV-2 RNA is generally detectable in upper and lower  respiratory specimens dur ing the acute phase of infection.  Positive  results are indicative of  active infection with SARS-CoV-2.  Clinical  correlation with patient history and other diagnostic information is  necessary to determine patient infection status.  Positive results do  not rule out bacterial infection or co-infection with other viruses. If result is PRESUMPTIVE POSTIVE SARS-CoV-2 nucleic acids MAY BE PRESENT.   A presumptive positive result was obtained on the submitted specimen  and confirmed on repeat testing.  While 2019 novel coronavirus  (SARS-CoV-2) nucleic acids may be present in the submitted sample  additional confirmatory testing may be necessary for epidemiological  and / or clinical management purposes  to differentiate between  SARS-CoV-2 and other Sarbecovirus currently known to infect humans.  If clinically indicated additional testing with an alternate test  methodology [phone]) is advised. The SARS-CoV-2 RNA is generally  detectable in upper and lower  respiratory sp ecimens during the acute  phase of infection. The expected result is Negative. Fact Sheet for Patients:  StrictlyIdeas.no Fact Sheet for Healthcare Providers: BankingDealers.co.za This test is not yet approved or cleared by the [place] FDA and has been authorized for detection and/or diagnosis of SARS-CoV-2 by FDA under an Emergency Use Authorization (EUA).  This EUA will remain in effect (meaning this test can be used) for the duration of the COVID-19 declaration under Section 564(b)(1) of the Act, 21 U.S.C. section 360bbb-3(b)(1), unless the authorization is terminated or revoked sooner. Performed at [facility], [place] [address] [place], [place] [address]      Labs: BNP (last 3 results) No results for input(s): BNP in the last 8760 hours. Basic Metabolic Panel: Recent Labs  Lab 12/23/18 1627 12/24/18 0211 12/25/18 0207 12/26/18 0420 12/27/18 0405  NA 139 138 140 141 142  K 3.2* 3.1* 2.9* 3.4* 3.7  CL 103 106 106 109 109  CO2 26 20* 26 22 26   GLUCOSE 132* 112* 100* 99 121*  BUN 21 18 7* 5* 7*  CREATININE 1.14* 1.02* 0.79 0.77 0.88  CALCIUM 9.7 9.1 8.5* 8.6* 8.8*  MG  --   --  1.7 1.7 1.8   Liver Function Tests: Recent Labs  Lab 12/23/18 1627 12/24/18 0211 12/25/18 0207  AST 23 22 18   ALT 33 27 20  ALKPHOS 59 54 48  BILITOT 0.6 0.6 0.5  PROT 6.0* 5.5* 5.1*  ALBUMIN 3.2* 3.0* 2.6*   Recent Labs  Lab 12/23/18 1627  LIPASE 23   No results for input(s): AMMONIA in the last 168 hours. CBC: Recent Labs  Lab 12/23/18 1627 12/24/18 0211 12/25/18 0207 12/26/18 0420 12/27/18 0405  WBC 16.3* 11.3* 9.5 7.2 6.9  NEUTROABS  --  8.0*  --   --   --   HGB 14.2 14.2 12.6 12.8 13.1  HCT 43.4 43.4 38.3 39.7 39.8  MCV 93.9 92.7 92.7 94.3 92.6  PLT 333 243 267 231 252   Cardiac Enzymes: No results for input(s): CKTOTAL, CKMB, CKMBINDEX, TROPONINI in the last 168 hours. BNP: Invalid  input(s): POCBNP CBG: Recent Labs  Lab 12/24/18 0708 12/24/18 1137 12/24/18 1635 12/25/18 0646  GLUCAP 122* 89 127* 88   D-Dimer No results for input(s): DDIMER in the last 72 hours. Hgb A1c No results for input(s): HGBA1C in the last 72 hours. Lipid Profile No results for input(s): CHOL, HDL, LDLCALC, TRIG, CHOLHDL, LDLDIRECT in the last 72 hours. Thyroid function studies No results for input(s): TSH, T4TOTAL, T3FREE, THYROIDAB in the last 72 hours.  Invalid input(s): FREET3 Anemia work up No results for input(s): VITAMINB12, FOLATE, FERRITIN, TIBC,  IRON, RETICCTPCT in the last 72 hours. Urinalysis    Component Value Date/Time   COLORURINE YELLOW 12/23/2018 1622   APPEARANCEUR CLEAR 12/23/2018 1622   LABSPEC 1.014 12/23/2018 1622   PHURINE 6.0 12/23/2018 1622   GLUCOSEU NEGATIVE 12/23/2018 1622   HGBUR NEGATIVE 12/23/2018 1622   BILIRUBINUR NEGATIVE 12/23/2018 1622   BILIRUBINUR n 01/25/2013 1113   KETONESUR NEGATIVE 12/23/2018 1622   PROTEINUR NEGATIVE 12/23/2018 1622   UROBILINOGEN negative 01/25/2013 1113   UROBILINOGEN 0.2 06/17/2009 1308   NITRITE NEGATIVE 12/23/2018 1622   LEUKOCYTESUR TRACE (A) 12/23/2018 1622   Sepsis Labs Invalid input(s): PROCALCITONIN,  WBC,  LACTICIDVEN Microbiology Recent Results (from the past 240 hour(s))  SARS Coronavirus 2 (CEPHEID - Performed in [place] hospital lab), Hosp Order     Status: None   Collection Time: 12/24/18 12:20 AM   Specimen: Nasopharyngeal Swab  Result Value Ref Range Status   SARS Coronavirus 2 NEGATIVE NEGATIVE Final    Comment: (NOTE) If result is NEGATIVE SARS-CoV-2 target nucleic acids are NOT DETECTED. The SARS-CoV-2 RNA is generally detectable in upper and lower  respiratory specimens during the acute phase of infection. The lowest  concentration of SARS-CoV-2 viral copies this assay can detect is 250  copies / mL. A negative result does not preclude SARS-CoV-2 infection  and should not be used  as the sole basis for treatment or other  patient management decisions.  A negative result may occur with  improper specimen collection / handling, submission of specimen other  than nasopharyngeal swab, presence of viral mutation(s) within the  areas targeted by this assay, and inadequate number of viral copies  (<250 copies / mL). A negative result must be combined with clinical  observations, patient history, and epidemiological information. If result is POSITIVE SARS-CoV-2 target nucleic acids are DETECTED. The SARS-CoV-2 RNA is generally detectable in upper and lower  respiratory specimens dur ing the acute phase of infection.  Positive  results are indicative of active infection with SARS-CoV-2.  Clinical  correlation with patient history and other diagnostic information is  necessary to determine patient infection status.  Positive results do  not rule out bacterial infection or co-infection with other viruses. If result is PRESUMPTIVE POSTIVE SARS-CoV-2 nucleic acids MAY BE PRESENT.   A presumptive positive result was obtained on the submitted specimen  and confirmed on repeat testing.  While 2019 novel coronavirus  (SARS-CoV-2) nucleic acids may be present in the submitted sample  additional confirmatory testing may be necessary for epidemiological  and / or clinical management purposes  to differentiate between  SARS-CoV-2 and other Sarbecovirus currently known to infect humans.  If clinically indicated additional testing with an alternate test  methodology [phone]) is advised. The SARS-CoV-2 RNA is generally  detectable in upper and lower respiratory sp ecimens during the acute  phase of infection. The expected result is Negative. Fact Sheet for Patients:  StrictlyIdeas.no Fact Sheet for Healthcare Providers: BankingDealers.co.za This test is not yet approved or cleared by the [place] FDA and has been authorized for  detection and/or diagnosis of SARS-CoV-2 by FDA under an Emergency Use Authorization (EUA).  This EUA will remain in effect (meaning this test can be used) for the duration of the COVID-19 declaration under Section 564(b)(1) of the Act, 21 U.S.C. section 360bbb-3(b)(1), unless the authorization is terminated or revoked sooner. Performed at [facility], [place] [address], [place], [place] [address]      Time coordinating discharge: 35 minutes  SIGNED:   Aline August, MD  [facility] 12/27/2018, 10:27 AM

## 2018-12-27 NOTE — Progress Notes (Signed)
Patient ID: [name], female   DOB: 15-Aug-1945, 73 y.o.   MRN: [id number]       Subjective: No complaints of abdominal pain today.  Some pelvic pain secondary to a yeast infection.  Tolerating low fiber diet well.  Had a BM yesterday.  Objective: Vital signs in last 24 hours: Temp:  [98.4 F (36.9 C)-98.9 F (37.2 C)] 98.6 F (37 C) (07/21 0454) Pulse Rate:  [63-69] 63 (07/21 0454) Resp:  [18] 18 (07/21 0454) BP: (119-143)/(62-76) 119/76 (07/21 0454) SpO2:  [96 %-99 %] 97 % (07/21 0454) Weight:  [121.2 kg] 121.2 kg (07/20 2041) Last BM Date: 12/26/18  Intake/Output from previous day: 07/20 0701 - 07/21 0700 In: 1205.9 [P.O.:720; I.V.:335.9; IV Piggyback:150] Out: 3901 [Urine:3900; Stool:1] Intake/Output this shift: No intake/output data recorded.  PE: Heart: regular Lungs: CTAB Abd: soft, NT, ND, +BS, obese  Lab Results:  Recent Labs    12/26/18 0420 12/27/18 0405  WBC 7.2 6.9  HGB 12.8 13.1  HCT 39.7 39.8  PLT 231 252   BMET Recent Labs    12/26/18 0420 12/27/18 0405  NA 141 142  K 3.4* 3.7  CL 109 109  CO2 22 26  GLUCOSE 99 121*  BUN 5* 7*  CREATININE 0.77 0.88  CALCIUM 8.6* 8.8*   PT/INR No results for input(s): LABPROT, INR in the last 72 hours. CMP     Component Value Date/Time   NA 142 12/27/2018 0405   NA 141 02/28/2018 0954   K 3.7 12/27/2018 0405   CL 109 12/27/2018 0405   CO2 26 12/27/2018 0405   GLUCOSE 121 (H) 12/27/2018 0405   BUN 7 (L) 12/27/2018 0405   BUN 18 02/28/2018 0954   CREATININE 0.88 12/27/2018 0405   CREATININE 0.79 01/21/2016 0947   CALCIUM 8.8 (L) 12/27/2018 0405   PROT 5.1 (L) 12/25/2018 0207   PROT 5.6 (L) 02/28/2018 0954   ALBUMIN 2.6 (L) 12/25/2018 0207   ALBUMIN 3.8 02/28/2018 0954   AST 18 12/25/2018 0207   ALT 20 12/25/2018 0207   ALKPHOS 48 12/25/2018 0207   BILITOT 0.5 12/25/2018 0207   BILITOT 0.9 02/28/2018 0954   GFRNONAA >60 12/27/2018 0405   GFRAA >60 12/27/2018 0405   Lipase      Component Value Date/Time   LIPASE 23 12/23/2018 1627       Studies/Results: No results found.  Anti-infectives: Anti-infectives (From admission, onward)   Start     Dose/Rate Route Frequency Ordered Stop   12/24/18 0300  piperacillin-tazobactam (ZOSYN) IVPB 3.375 g     3.375 g 12.5 mL/hr over 240 Minutes Intravenous Every 8 hours 12/24/18 0210     12/23/18 2345  piperacillin-tazobactam (ZOSYN) IVPB 3.375 g     3.375 g 100 mL/hr over 30 Minutes Intravenous  Once 12/23/18 2341 12/24/18 0052       Assessment/Plan Diverticulitis with small undrainable collection -AF, WBC normal. -tolerating diet -transition to oral augmentin for DC home, another 7 days would be ideal -will arrange outpatient follow up for her in 3-4 weeks. -may transition back to eliquis. -surgically stable for DC home  FEN - low fiber diet VTE - heparin gtt, transition back to eliquis ID - zosyn, DC and plan for 7 more days of augmentin   LOS: 4 days    Henreitta Cea , [facility] 12/27/2018, 8:27 AM Pager: [phone]

## 2018-12-27 NOTE — Progress Notes (Signed)
Nutrition Education Note  RD consulted for nutrition education regarding a Low Fiber/Low Residue diet.   RD provided "Low Fiber Nutrition Therapy" handout from the Academy of [employer] and Dietetics. Discussed nutrition therapy to reduce the irritation of the gastrointestinal tract to promote healing. Provided list of recommended low fiberous foods in comparison to foods with high amounts of fiber, as well as a recommended sample day. Teach back method used.  Expect good compliance.  Body mass index is 40.63 kg/m. Pt meets criteria for morbid obesity based on current BMI.  Current diet order is low sodium, patient is consuming approximately 75-100% of meals at this time. Labs and medications reviewed. RD contact information provided. If additional nutrition issues arise, please re-consult RD.  Mariana Single RD, LDN Clinical Nutrition Pager # [phone]

## 2018-12-27 NOTE — Progress Notes (Signed)
ANTICOAGULATION CONSULT NOTE - Follow-up Consult  Pharmacy Consult for Heparin Indication: atrial fibrillation  Allergies  Allergen Reactions  . Sulfa Antibiotics Other (See Comments)    Turned eyes yellow   . Sulfonamide Derivatives Rash    Rash  . Atenolol Cough  . Erythromycin     Nausea  . Erythromycin Base Nausea Only  . Lactose Intolerance (Gi) Diarrhea    Just MILK   . Plavix [Clopidogrel Bisulfate] Cough    cough    Patient Measurements: Height: 5\' 8"  (172.7 cm)(previous hospitalization) Weight: 267 lb 3.2 oz (121.2 kg) IBW/kg (Calculated) : 63.9 Heparin Dosing Weight: 92.4 kg  Vital Signs: Temp: 98.6 F (37 C) (07/21 0454) Temp Source: Oral (07/21 0454) BP: 119/76 (07/21 0454) Pulse Rate: 63 (07/21 0454)  Labs: Recent Labs    12/25/18 0207 12/26/18 0420 12/27/18 0405  HGB 12.6 12.8 13.1  HCT 38.3 39.7 39.8  PLT 267 231 252  APTT 92* 75* 74*  HEPARINUNFRC 1.38* 1.05* 0.52  CREATININE 0.79 0.77 0.88    Estimated Creatinine Clearance: 79.2 mL/min (by C-G formula based on SCr of 0.88 mg/dL). Assessment: 73 y.o. female with h/o Afib, Eliquis on hold for possible diverticular abcess/drain placement.   PTA Eliquis 5mg  BID; last dose 7/17 @ 0900  PTT therapeutic, Heparin level therapeutic  H&H and platelets WNL; no signs of bleeding  Goal of Therapy:  APTT 66-102 Heparin level 0.3-0.7 units/ml Monitor platelets by anticoagulation protocol: Yes   Plan:  -Continue heparin drip at 1400 units/hr -Monitor daily aPTT, heparin level, and CBC -Monitor for signs and symptoms of bleeding -F/u surgical plan and plan to switch back to oral anticoagulation  Thank you Anette Guarneri, PharmD [phone] 12/27/2018,8:35 AM

## 2019-01-02 ENCOUNTER — Telehealth: Payer: Self-pay | Admitting: Cardiovascular Disease

## 2019-01-02 NOTE — Telephone Encounter (Signed)
Patient would like for nurse to give her call.

## 2019-01-02 NOTE — Telephone Encounter (Signed)
Events noted

## 2019-01-02 NOTE — Telephone Encounter (Signed)
Pt calling to inform Dr. Acie Fredrickson that she was in the ER and subsequently hospitalized while on vacation for perforated bowel. After returning home, she developed recurrent abdominal pain and was re-hospitalized at [facility] for additional treatment. She was told to report these occurrences to her PCP and to Dr. Acie Fredrickson. Reports that she is doing OK now, but may need surgery at some point.

## 2019-01-27 ENCOUNTER — Telehealth: Payer: Self-pay | Admitting: Cardiovascular Disease

## 2019-01-27 DIAGNOSIS — E782 Mixed hyperlipidemia: Secondary | ICD-10-CM

## 2019-01-27 DIAGNOSIS — I10 Essential (primary) hypertension: Secondary | ICD-10-CM

## 2019-01-27 DIAGNOSIS — K5792 Diverticulitis of intestine, part unspecified, without perforation or abscess without bleeding: Secondary | ICD-10-CM

## 2019-01-27 NOTE — Telephone Encounter (Signed)
°  New message    Patient calling to discuss the directions given by another provider stating she needs to drink 64 ounces of water a day before having surgery on hemorrhoids.

## 2019-01-27 NOTE — Telephone Encounter (Signed)
Spoke with patient who states she has recently had 2 bouts with diverticulitis and has been advised to drink 64 oz of water daily with Citrucel as long as cardiologist agrees. I advised that her EF in January was normal at 55-60%. States she has increased her triamterene/hct to 1.5 pills daily. I advised her to monitor for signs of weight gain, swelling, or SOB and to call back if she has questions or concern. She asked questions about her blood sugar and diet and after much discussion I offered to make a referral to a dietician. She was thankful for the call.

## 2019-02-10 ENCOUNTER — Other Ambulatory Visit: Payer: Self-pay

## 2019-02-10 DIAGNOSIS — M79661 Pain in right lower leg: Secondary | ICD-10-CM

## 2019-02-10 DIAGNOSIS — S85001D Unspecified injury of popliteal artery, right leg, subsequent encounter: Secondary | ICD-10-CM

## 2019-02-14 ENCOUNTER — Telehealth (HOSPITAL_COMMUNITY): Payer: Self-pay | Admitting: *Deleted

## 2019-02-14 NOTE — Telephone Encounter (Signed)
The above patient or their representative was contacted and gave the following answers to these questions:         Do you have any of the following symptoms?n  Fever                    Cough                   Shortness of breath  Do  you have any of the following other symptoms? n   muscle pain         vomiting,        diarrhea        rash         weakness        red eye        abdominal pain         bruising          bruising or bleeding              joint pain           severe headache    Have you been in contact with someone who was or has been sick in the past 2 weeks?n  Yes                 Unsure                         Unable to assess   Does the person that you were in contact with have any of the following symptoms?   Cough         shortness of breath           muscle pain         vomiting,            diarrhea            rash            weakness           fever            red eye           abdominal pain           bruising  or  bleeding                joint pain                severe headache               Have you  or someone you have been in contact with traveled internationally in th last month?         If yes, which countries?   Have you  or someone you have been in contact with traveled outside [place] in th last month?         If yes, which state and city?   COMMENTS OR ACTION PLAN FOR THIS PATIENT:         

## 2019-02-15 ENCOUNTER — Ambulatory Visit (INDEPENDENT_AMBULATORY_CARE_PROVIDER_SITE_OTHER)
Admission: RE | Admit: 2019-02-15 | Discharge: 2019-02-15 | Disposition: A | Discharge status: Home / Self Care | Payer: Medicare Other | Source: Ambulatory Visit | Attending: Family | Admitting: Family

## 2019-02-15 ENCOUNTER — Other Ambulatory Visit: Payer: Self-pay

## 2019-02-15 ENCOUNTER — Ambulatory Visit (INDEPENDENT_AMBULATORY_CARE_PROVIDER_SITE_OTHER): Payer: Medicare Other | Admitting: Family

## 2019-02-15 ENCOUNTER — Encounter: Payer: Self-pay | Admitting: Family

## 2019-02-15 ENCOUNTER — Ambulatory Visit (HOSPITAL_COMMUNITY)
Admission: RE | Admit: 2019-02-15 | Discharge: 2019-02-15 | Disposition: A | Discharge status: Home / Self Care | Payer: Medicare Other | Source: Ambulatory Visit | Attending: Family | Admitting: Family

## 2019-02-15 VITALS — BP 138/82 | HR 74 | Temp 97.9°F | Resp 14 | Ht 68.0 in | Wt 267.0 lb

## 2019-02-15 DIAGNOSIS — M79661 Pain in right lower leg: Secondary | ICD-10-CM | POA: Diagnosis not present

## 2019-02-15 DIAGNOSIS — S85001D Unspecified injury of popliteal artery, right leg, subsequent encounter: Secondary | ICD-10-CM | POA: Diagnosis not present

## 2019-02-15 DIAGNOSIS — I872 Venous insufficiency (chronic) (peripheral): Secondary | ICD-10-CM | POA: Diagnosis not present

## 2019-02-15 NOTE — Progress Notes (Signed)
[facility]    CC: Follow up injury to right popliteal artery, and right LE arterial bypass graft  History of Present Illness Aradhana Zarrah Teska is a 73 y.o. female who is s/p harvest of right greater saphenous vein, and right above knee to below knee popliteal artery bypass with non-reversed greater saphenous vein on 05-17-17 by Dr. Donzetta Matters for acute right lower extremity ischemia. This was after a fall from standing on ice resulting in a knee dislocation.    Dr. Donzetta Matters last evaluated pt on 01-21-18. At that time she was doing well, had a palpable pedal pulse.  She desired to stop aspirin and settled on taking it a few days a week given that she also takes a blood thinner and had significant bruising.  Dr. Donzetta Matters advised follow-up in 1 year with repeat duplex and ABIs.  If she has issues before this will see her sooner. Dr. Donzetta Matters congratulated her on her hard work getting back on her feet.  This was certainly a devastating injury just 9 months prior, she is appeared to be doing quite well.  She denies any known hx of stroke or TIA. She states that her colon was perforated after an episode of diverticulitis in July 2020, in  [place] or [place], [place].   She wears thigh high compression hose. She states that the swelling in her lower legs and feet resolves by morning with overnight elevation.   Diabetic: Yes Tobacco use: non-smoker  Pt meds include: Statin :No Betablocker: No ASA: Yes Other anticoagulants/antiplatelets: Eliquis  Past Medical History:  Diagnosis Date  . Abnormal Pap smear    Rare ASCUS-H  . Arthritis   . Atrial fibrillation ([place])   . Cancer (HCC)    melanoma rt leg  . Chest pain   . Diabetes mellitus without complication ([place])   . HTN (hypertension) 07/02/2015  . Hypercholesteremia   . Hyperglycemia 02/04/2011  . Hyperlipidemia 02/04/2011  . Hypertension    per pt no htn but needs meds for other reason  . Hypokalemia 05/17/2017  . Injury  of right popliteal artery 05/17/2017  . Knee dislocation 05/17/2017  . Leg edema 02/04/2011  . Leg swelling 02/02/2011  . Leukocytosis 05/17/2017  . Obesity   . PAF (paroxysmal atrial fibrillation) ([place]) 02/02/2011  . Popliteal artery injury 05/17/2017    Social History Social History   Tobacco Use  . Smoking status: Never Smoker  . Smokeless tobacco: Never Used  Substance Use Topics  . Alcohol use: No  . Drug use: No    Family History Family History  Problem Relation Age of Onset  . Emphysema Father   . Atrial fibrillation Mother        has pacemaker  . Diabetes Mother   . Hypertension Mother   . Other Brother        bulbar palsy    Past Surgical History:  Procedure Laterality Date  . BYPASS GRAFT POPLITEAL TO POPLITEAL Right 05/17/2017   Procedure: BYPASS GRAFT ABOVE KNEE POPLITEAL TO BELOW KNEE POPLITEAL USING NONREVERSED RIGHT GREATER SAPHENOUS VEIN;  Surgeon: Waynetta Sandy, MD;  Location: [facility];  Service: Vascular;  Laterality: Right;  . CARDIAC CATHETERIZATION  07/11/2010   Est. EF of 60-65% -- Smooth and normal coronary arteries  -- Normal LV systolic function   . CATARACT EXTRACTION, BILATERAL     08-12-17, 08-26-17  . CHOLECYSTECTOMY    . CYSTOCELE REPAIR  07/22/2010   Vault suspension, cystocele repair, graft and cystoscopy  .  FINGER SURGERY    . MELANOMA EXCISION     right leg 3/12  . NASAL SINUS SURGERY    . NEUROMA SURGERY     rt foot  . TONSILLECTOMY    . TOTAL KNEE ARTHROPLASTY  06/20/2009   left knee  . TOTAL KNEE ARTHROPLASTY  12/27/2008   right knee  . TOTAL VAGINAL HYSTERECTOMY  07/22/2010   with bilateral salpingo-oophorectomy by Dr. Joan Flores  . VEIN HARVEST Right 05/17/2017   Procedure: RIGHT GREATER SAPHENOUS VEIN HARVEST;  Surgeon: Waynetta Sandy, MD;  Location: [facility];  Service: Vascular;  Laterality: Right;    Allergies  Allergen Reactions  . Sulfa Antibiotics Other (See Comments)    Turned eyes yellow   .  Sulfonamide Derivatives Rash    Rash  . Atenolol Cough  . Erythromycin     Nausea  . Erythromycin Base Nausea Only  . Lactose Intolerance (Gi) Diarrhea    Just MILK   . Plavix [Clopidogrel Bisulfate] Cough    cough    Current Outpatient Medications  Medication Sig Dispense Refill  . aspirin EC 81 MG tablet Take 81 mg by mouth See admin instructions. Lydia Guiles, thur, sat    . B Complex Vitamins (B COMPLEX PO) Take 1 tablet by mouth every evening.     . Calcium Carbonate-Vit D-Min (GNP CALCIUM PLUS 600 +D PO) Take 1 tablet by mouth every other day.     . diltiazem (CARDIZEM CD) 240 MG 24 hr capsule TAKE 1 CAPSULE BY MOUTH DAILY. (Patient taking differently: Take 240 mg by mouth daily. ) 90 capsule 2  . ELIQUIS 5 MG TABS tablet TAKE 1 TABLET BY MOUTH 2 TIMES DAILY. (Patient taking differently: Take 5 mg by mouth 2 (two) times daily. ) 60 tablet 9  . Flaxseed, Linseed, (FLAX SEED OIL) 1000 MG CAPS Take by mouth.    . loratadine (CLARITIN) 10 MG tablet Take 10 mg by mouth daily.    . mometasone (NASONEX) 50 MCG/ACT nasal spray Place 1 spray into the nose daily as needed for congestion.  0  . multivitamin (THERAGRAN) per tablet Take 1 tablet by mouth daily.      . Omega-3 Fatty Acids (FISH OIL) 1000 MG CAPS Take by mouth.    . OMEPRAZOLE PO Take 20 mg by mouth every evening.     Marland Kitchen PATADAY 0.2 % SOLN Place 1 drop into both eyes daily.   4  . potassium chloride (K-DUR) 10 MEQ tablet TAKE 1 TABLET BY MOUTH DAILY. (Patient taking differently: Take 10 mEq by mouth every morning. ) 90 tablet 3  . triamterene-hydrochlorothiazide (MAXZIDE) 75-50 MG tablet Take 1.5 tablets by mouth daily.      No current facility-administered medications for this visit.     ROS: See HPI for pertinent positives and negatives.   Physical Examination  Vitals:   02/15/19 1412  BP: 138/82  Pulse: 74  Resp: 14  Temp: 97.9 F (36.6 C)  TempSrc: Temporal  SpO2: 98%  Weight: 267 lb (121.1 kg)  Height: 5\' 8"   (1.727 m)   Body mass index is 40.6 kg/m.  General: A&O x 3, WDWN, morbidly obese female. Gait: normal HEENT: No gross abnormalities.  Pulmonary: Respirations are non labored, CTAB, good air movement in all fields Cardiac: regular rhythm, no detected murmur.         Carotid Bruits Right Left   Negative Negative   Radial pulses are 2+ palpable bilaterally   Adominal aortic pulse is  not palpable                         VASCULAR EXAM: Extremities without ischemic changes, without Gangrene; without open wounds. Mild erythema at lower anterior legs, 1+ non pitting pretibial/ankle/feet edema.                                                                                                          LE Pulses Right Left       FEMORAL  palpable   palpable        POPLITEAL  not palpable   not palpable       POSTERIOR TIBIAL   palpable    palpable        DORSALIS PEDIS      ANTERIOR TIBIAL  palpable   palpable    Abdomen: soft, NT, no palpable masses. Large panus. Skin: no rashes, no cellulitis, no ulcers noted. See Extremities  Musculoskeletal: no muscle wasting or atrophy.  Neurologic: A&O X 3; appropriate affect, Sensation is normal; MOTOR FUNCTION:  moving all extremities equally, motor strength 5/5 throughout. Speech is fluent/normal. CN 2-12 intact. Psychiatric: Thought content is normal, mood appropriate for clinical situation.     DATA  Right LE Arterial Duplex (02-15-19): Right Graft #1: +------------------+--------+--------+---------+--------+                   PSV cm/sStenosisWaveform Comments +------------------+--------+--------+---------+--------+ Inflow            89              triphasic         +------------------+--------+--------+---------+--------+ Prox Anastomosis  52              biphasic          +------------------+--------+--------+---------+--------+ Proximal Graft    58              triphasic          +------------------+--------+--------+---------+--------+ Mid Graft         56              triphasic         +------------------+--------+--------+---------+--------+ Distal Graft      44              biphasic          +------------------+--------+--------+---------+--------+ Distal Anastomosis62              triphasic         +------------------+--------+--------+---------+--------+ Outflow           56              biphasic          +------------------+--------+--------+---------+--------+ Summary: Right: Patent right above knee to below knee popliteal artery bypass graft with no evidence for restenosis.    ABI (Date: 02/15/2019): ABI Findings: +---------+------------------+-----+---------+--------+ Right    Rt Pressure (mmHg)IndexWaveform Comment  +---------+------------------+-----+---------+--------+ Brachial 120                                      +---------+------------------+-----+---------+--------+  PTA      174               1.45 triphasic         +---------+------------------+-----+---------+--------+ DP       160               1.33 triphasic         +---------+------------------+-----+---------+--------+ Great Toe130               1.08 Normal            +---------+------------------+-----+---------+--------+  +---------+------------------+-----+---------+-------+ Left     Lt Pressure (mmHg)IndexWaveform Comment +---------+------------------+-----+---------+-------+ Brachial 117                                     +---------+------------------+-----+---------+-------+ PTA      162               1.35 triphasic        +---------+------------------+-----+---------+-------+ DP       161               1.34 triphasic        +---------+------------------+-----+---------+-------+ Great Toe130               1.08 Normal            +---------+------------------+-----+---------+-------+ +-------+-----------+-----------+------------+------------+ ABI/TBIToday's ABIToday's TBIPrevious ABIPrevious TBI +-------+-----------+-----------+------------+------------+ Right  1.45       1.08       1.19        0.92         +-------+-----------+-----------+------------+------------+ Left   1.35       1.08       1.20        1.01         +-------+-----------+-----------+------------+------------+ Summary: Right: Resting right ankle-brachial index indicates noncompressible right lower extremity arteries. Triphasic waveforms suggest adequate perfusion. The right toe-brachial index is normal. Left: Resting left ankle-brachial index is within normal range. No evidence of significant left lower extremity arterial disease. The left toe-brachial index is normal.   ASSESSMENT: Vinia Munck is a 73 y.o. female who is s/p harvest of right greater saphenous vein, and right above knee to below knee popliteal artery bypass with non-reversed greater saphenous vein on 05-17-17 by Dr. Donzetta Matters for acute right lower extremity ischemia. This was after a fall from standing on ice resulting in a knee dislocation.    Her pedal pulses are palpable.  Bilateral ABI and TBI are normal.  Arterial duplex of right LE shows a patent right above knee to below knee popliteal artery bypass graft with no evidence for restenosis.  Chronic venous insufficiency with mild dependent edema, exacerbated by morbid obesity: continue thigh high or knee high compression hose; elevation instructions discussed with pt.    PLAN:  Based on the patient's vascular studies and examination, pt will return to clinic in 1 year with ABI's and right LE arterial duplex.  I advised her to notify us if she develops concerns re the circulation in her feet or legs.    Clemon Chambers, RN, MSN, FNP-C [facility] [employer] Phone:  [phone]  Clinic MD: Oneida Alar  02/15/19 2:20 PM

## 2019-02-15 NOTE — Patient Instructions (Signed)
To decrease swelling in your feet and legs: Elevate feet above slightly bent knees, feet above heart, overnight and 3-4 times per day for 20 minutes.    Chronic Venous Insufficiency Chronic venous insufficiency is a condition where the leg veins cannot effectively pump blood from the legs to the heart. This happens when the vein walls are either stretched, weakened, or damaged, or when the valves inside the vein are damaged. With the right treatment, you should be able to continue with an active life. This condition is also called venous stasis. What are the causes? Common causes of this condition include:  High blood pressure inside the veins (venous hypertension).  Sitting or standing too long, causing increased blood pressure in the leg veins.  A blood clot that blocks blood flow in a vein (deep vein thrombosis, DVT).  Inflammation of a vein (phlebitis) that causes a blood clot to form.  Tumors in the pelvis that cause blood to back up. What increases the risk? The following factors may make you more likely to develop this condition:  Having a family history of this condition.  Obesity.  Pregnancy.  Living without enough regular physical activity or exercise (sedentary lifestyle).  Smoking.  Having a job that requires long periods of standing or sitting in one place.  Being a certain age. Women in their 56s and 20s and men in their 16s are more likely to develop this condition. What are the signs or symptoms? Symptoms of this condition include:  Veins that are enlarged, bulging, or twisted (varicose veins).  Skin breakdown or ulcers.  Reddened skin or dark discoloration of skin on the leg between the knee and ankle.  Brown, smooth, tight, and painful skin just above the ankle, usually on the inside of the leg (lipodermatosclerosis).  Swelling of the legs. How is this diagnosed? This condition may be diagnosed based on:  Your medical history.  A physical exam.   Tests, such as: ? A procedure that creates an image of a blood vessel and nearby organs and provides information about blood flow through the blood vessel (duplex ultrasound). ? A procedure that tests blood flow (plethysmography). ? A procedure that looks at the veins using X-ray and dye (venogram). How is this treated? The goals of treatment are to help you return to an active life and to minimize pain or disability. Treatment depends on the severity of your condition, and it may include:  Wearing compression stockings. These can help relieve symptoms and help prevent your condition from getting worse. However, they do not cure the condition.  Sclerotherapy. This procedure involves an injection of a solution that shrinks damaged veins.  Surgery. This may involve: ? Removing a diseased vein (vein stripping). ? Cutting off blood flow through the vein (laser ablation surgery). ? Repairing or reconstructing a valve within the affected vein. Follow these instructions at home:      Wear compression stockings as told by your health care provider. These stockings help to prevent blood clots and reduce swelling in your legs.  Take over-the-counter and prescription medicines only as told by your health care provider.  Stay active by exercising, walking, or doing different activities. Ask your health care provider what activities are safe for you and how much exercise you need.  Drink enough fluid to keep your urine pale yellow.  Do not use any products that contain nicotine or tobacco, such as cigarettes, e-cigarettes, and chewing tobacco. If you need help quitting, ask your health care provider.  Keep all follow-up visits as told by your health care provider. This is important. Contact a health care provider if you:  Have redness, swelling, or more pain in the affected area.  See a red streak or line that goes up or down from the affected area.  Have skin breakdown or skin loss in the  affected area, even if the breakdown is small.  Get an injury in the affected area. Get help right away if:  You get an injury and an open wound in the affected area.  You have: ? Severe pain that does not get better with medicine. ? Sudden numbness or weakness in the foot or ankle below the affected area. ? Trouble moving your foot or ankle. ? A fever. ? Worse or persistent symptoms. ? Chest pain. ? Shortness of breath. Summary  Chronic venous insufficiency is a condition where the leg veins cannot effectively pump blood from the legs to the heart.  Chronic venous insufficiency occurs when the vein walls become stretched, weakened, or damaged, or when valves within the vein are damaged.  Treatment depends on how severe your condition is. It often involves wearing compression stockings and may involve having a procedure.  Make sure you stay active by exercising, walking, or doing different activities. Ask your health care provider what activities are safe for you and how much exercise you need. This information is not intended to replace advice given to you by your health care provider. Make sure you discuss any questions you have with your health care provider. Document Released: 09/28/2006 Document Revised: 02/15/2018 Document Reviewed: 02/15/2018 Elsevier Patient Education  2020 [employer].

## 2019-03-18 ENCOUNTER — Other Ambulatory Visit: Payer: Self-pay | Admitting: Cardiovascular Disease

## 2019-04-01 ENCOUNTER — Other Ambulatory Visit: Payer: Self-pay | Admitting: Cardiovascular Disease

## 2019-04-03 NOTE — Telephone Encounter (Signed)
Pt last saw Dr Acie Fredrickson 08/29/18, last labs 12/27/18 Creat 0.88, age 73, weight 121.1kg, based on specified criteria pt is on appropriate dosage of Eliquis 5mg  BID.  Will refill rx.

## 2019-05-29 ENCOUNTER — Other Ambulatory Visit: Payer: Self-pay | Admitting: Cardiovascular Disease

## 2019-05-29 ENCOUNTER — Telehealth: Payer: Self-pay | Admitting: Cardiovascular Disease

## 2019-05-29 MED ORDER — PROPRANOLOL HCL 10 MG PO TABS: 10.0000 mg | ORAL_TABLET | Freq: Four times a day (QID) | ORAL | 6 refills | Status: DC | PRN

## 2019-05-29 NOTE — Telephone Encounter (Signed)
Will see her at her scheduled appt in January.

## 2019-05-29 NOTE — Telephone Encounter (Signed)
Pt c/o of Chest Pain: STAT if CP now or developed within 24 hours  1. Are you having CP right now? Pt is in afib  2. Are you experiencing any other symptoms (ex. SOB, nausea, vomiting, sweating)? no  3. How long have you been experiencing CP? Since thursday  4. Is your CP continuous or coming and going? Comes and goes  5. Have you taken Nitroglycerin? No. Pt has never been prescribed that     PT went to see her PCP today. She had an EKG done and the EKG shows that she is in Afib. She is having a mild amount of chest pain, but not sure if that is from the afib or indigestion. She does not feel like she is having a heart attack. She was told to follow up with Dr. Acie Fredrickson

## 2019-05-29 NOTE — Telephone Encounter (Signed)
Patient's propranolol refilled per request and agreement from Dr. Acie Fredrickson

## 2019-05-29 NOTE — Telephone Encounter (Signed)
I spoke with pt. She reports episode of chest tightness on Thursday. She is wondering if this could be reflux. Used inhaler which "might have helped."  She felt fine on Friday. On Saturday and Sunday she had a little discomfort. Some nausea.  Metamucil has been recently increased by Dr Dema Severin and pt is wondering if this could be contributing to nausea and possible reflux. Saw primary care today.  States lungs were clear and no fluid build up noted. EKG showed afib. Pt reports no other heart issues were noted at today's visit. Several lab studies including B12, A1C, CBC, CMET, lipid panel, TSH and troponin were done. These results will be sent to our office.   Pt is asking if she is due for follow up in our office.  Last office note indicates 6 month follow up planned.  I scheduled pt to see Dr Acie Fredrickson on January 4,2021 at 8:00.  She is asking about taking Inderal as needed. I told her this was for palpitations or elevated heart rate. Heart rate is currently in 70's and she is not having any palpitations.  This is not on her current med list and she is requesting refill. I told pt to let us know if symptoms change prior to appointment. I advised her to call EMS if severe symptoms.

## 2019-06-12 ENCOUNTER — Encounter: Payer: Self-pay | Admitting: Cardiovascular Disease

## 2019-06-12 ENCOUNTER — Other Ambulatory Visit: Payer: Self-pay

## 2019-06-12 ENCOUNTER — Ambulatory Visit: Payer: Medicare PPO | Admitting: Cardiovascular Disease

## 2019-06-12 VITALS — BP 126/70 | HR 74 | Ht 68.0 in | Wt 281.4 lb

## 2019-06-12 DIAGNOSIS — I480 Paroxysmal atrial fibrillation: Secondary | ICD-10-CM

## 2019-06-12 DIAGNOSIS — I10 Essential (primary) hypertension: Secondary | ICD-10-CM

## 2019-06-12 DIAGNOSIS — E782 Mixed hyperlipidemia: Secondary | ICD-10-CM

## 2019-06-12 NOTE — Progress Notes (Signed)
Cardiology Office Note   Date:  06/12/2019   ID:  Treyana, Hard May 30, 1946, MRN [id number]  PCP:  Leonard Downing, MD  Cardiologist:   Mertie Moores, MD   Chief Complaint  Patient presents with  . Atrial Fibrillation       [name] is a 74 y.o. female who presents for follow up of her paroxysmal atrial fib  1. Hypertension 2. History of atrial fibrillation 3. Hyperlipidemia 4.  Knee replacement 5.  melanoma       [name] is doing well. She has decreased her dose of Maxzide and her BP has been elevated. She has not been exercising on a regular basis but has been trying to exercise more since New's Years Day. She has lost 6 pounds. She has not had any chest pain or dyspnea.   She did feel a little bit of extra salt recently.   January 02, 2013:  [name] has done well. She had some various aches and pains (chest , back) in Feb. The pains improved with omeprazol but it caused excessive gas and bloating. Her lipids have been minimally elevated.   07/05/2013:  [name] is doing well. She has a sinus infection recently.   January 01, 2014:  [name] is doing well. She has had some sinus infections.   Jan. 25, 2016:  [name] is doing well. No cardiac issues.  Has had a few palpitations - heart racing.  But her HR would be normal if she takes her pulse.  Rides her stationary bike 30 mninutes a day.   Jan. 24, 2017:  [name] is seen for follow up of her atrial fib.  CHADS2VASC = 3 ( female, HTN, AGE > 26)  She has PAF - was in NSR at her last visit. Is back in A-Fib today .  Asymptomatic.  No CP or dyspnea  Has occasional right sided chest pain .   Has right shoulder pain and is seeing Dr. Onnie Graham soon Had a Myoivew in 2012 - ( inf. Defect) .  Subsequent cath showed normal coronary arteries.   December 19, 2015: [name] is back in NSR today  Has PAF , has never needed cardioversion. Takes propranolol as needed. Has some dizziness /  orthostasis  Some of her dizziness is not related to orthostasis   Jan. 29, 2018:  [name] is seen today .    Has been having problems with her hemmorhoids.   Asked about watchman  I have suggested that she get her hemorrhoids fixed and we resume anticoagulation shortly thereafter.  She has occasional episodes of brief presyncope. Typically occur if she sitting down. Has never had complete syncope   Sept. 7 , 2018:  Has had several episodes of lightneadedness. Typically when she just stands up.   Also can have lightheadedness when she turns her head Has not had true syncope Has run out of potassium 2 weeks ago   September 08, 2017:  [name] is seen today  Dislocated her Right knee,   Required vascular surgery ( popliteal artery rupture) Was in rehab for a month Developed MSRS Was then found to have melanoma  Bilateral cataract removal.   Needs to have left hip replacement but they will not do it until she has lost about 25 lbs.   March 02, 2018:  [name] is seen today has been having issues with abdominal cramping. Had chills and sweats Sunday night.     Wt. Is 285. 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 4, 2021  [name] is seen today for folow up of her PAF , HTN , obesity 3-4 weeks ago. Had some lightheadedness,  Difficulty taking a deep breath.  Had some mild CP .   Wt is 281 lbs  Is still having intermittant CP  She had labs at her primary MD.  CBC was unremarkable.  Basic metabolic profile was normal.  Creatinine 0.82.  Glucose is 84.  Sodium is 142.  Potassium is 3.6.  Liver enzymes are normal.  Total cholesterol is 166.  Triglyceride levels 103.  HDL is 46.  LDL is 101.  TSH is 1.85.  Hemoglobin A1c is 6.0.  Troponin I is less than 0.01.  Vitamin B-12 level is 687 which is normal.  Has had some PAF .  Is in NSR today  Takes propranolol as needed for   Past Medical History:   Diagnosis Date  . Abnormal Pap smear    Rare ASCUS-H  . Arthritis   . Atrial fibrillation ([place])   . Cancer (H[facility])    melanoma rt leg  . Chest pain   . Diabetes mellitus without complication ([place])   . HTN (hypertension) 07/02/2015  . Hypercholesteremia   . Hyperglycemia 02/04/2011  . Hyperlipidemia 02/04/2011  . Hypertension    per pt no htn but needs meds for other reason  . Hypokalemia 05/17/2017  . Injury of right popliteal artery 05/17/2017  . Knee dislocation 05/17/2017  . Leg edema 02/04/2011  . Leg swelling 02/02/2011  . Leukocytosis 05/17/2017  . Obesity   . PAF (paroxysmal atrial fibrillation) ([place]) 02/02/2011  . Popliteal artery injury 05/17/2017    Past Surgical History:  Procedure Laterality Date  . BYPASS GRAFT POPLITEAL TO POPLITEAL Right 05/17/2017   Procedure: BYPASS GRAFT ABOVE KNEE POPLITEAL TO BELOW KNEE POPLITEAL USING NONREVERSED RIGHT GREATER SAPHENOUS VEIN;  Surgeon: Waynetta Sandy, MD;  Location: [facility];  Service: Vascular;  Laterality: Right;  . CARDIAC CATHETERIZATION  07/11/2010   Est. EF of 60-65% -- Smooth and normal coronary arteries  -- Normal LV systolic function   . CATARACT EXTRACTION, BILATERAL     08-12-17, 08-26-17  . CHOLECYSTECTOMY    . CYSTOCELE REPAIR  07/22/2010   Vault suspension, cystocele repair, graft and cystoscopy  . FINGER SURGERY    . MELANOMA EXCISION     right leg 3/12  . NASAL SINUS SURGERY    . NEUROMA SURGERY     rt foot  . TONSILLECTOMY    . TOTAL KNEE ARTHROPLASTY  06/20/2009   left knee  . TOTAL KNEE ARTHROPLASTY  12/27/2008   right knee  . TOTAL VAGINAL HYSTERECTOMY  07/22/2010   with bilateral salpingo-oophorectomy by Dr. Joan Flores  . VEIN HARVEST Right 05/17/2017   Procedure: RIGHT GREATER SAPHENOUS VEIN HARVEST;  Surgeon: Waynetta Sandy, MD;  Location: [facility];  Service: Vascular;  Laterality: Right;     Current Outpatient Medications  Medication Sig Dispense Refill  . aspirin EC 81 MG tablet  Take 81 mg by mouth See admin instructions. [name], thur, sat    . B Complex Vitamins (B COMPLEX PO) Take 1 tablet by mouth every evening.     . Calcium Carbonate-Vit D-Min (GNP CALCIUM PLUS 600 +D PO) Take 1 tablet by mouth every other day.     . diltiazem (CARDIZEM CD) 240 MG 24 hr capsule  TAKE 1 CAPSULE BY MOUTH DAILY. (Patient taking differently: Take 240 mg by mouth daily. ) 90 capsule 2  . ELIQUIS 5 MG TABS tablet TAKE 1 TABLET BY MOUTH 2 TIMES DAILY. 60 tablet 6  . Flaxseed, Linseed, (FLAX SEED OIL) 1000 MG CAPS Take by mouth.    . loratadine (CLARITIN) 10 MG tablet Take 10 mg by mouth daily.    . mometasone (NASONEX) 50 MCG/ACT nasal spray Place 1 spray into the nose daily as needed for congestion.  0  . multivitamin (THERAGRAN) per tablet Take 1 tablet by mouth daily.      . Omega-3 Fatty Acids (FISH OIL) 1000 MG CAPS Take by mouth.    . OMEPRAZOLE PO Take 20 mg by mouth every evening.     Marland Kitchen PATADAY 0.2 % SOLN Place 1 drop into both eyes daily.   4  . potassium chloride (KLOR-CON) 10 MEQ tablet TAKE 1 TABLET BY MOUTH DAILY. 90 tablet 1  . propranolol (INDERAL) 10 MG tablet TAKE 1 TABLET BY MOUTH AS NEEDED. 30 tablet 6  . propranolol (INDERAL) 10 MG tablet Take 1 tablet (10 mg total) by mouth 4 (four) times daily as needed (palpitations or fast heart rate). 60 tablet 6  . triamterene-hydrochlorothiazide (MAXZIDE) 75-50 MG tablet Take 1.5 tablets by mouth daily.      No current facility-administered medications for this visit.    Allergies:   Sulfa antibiotics, Sulfonamide derivatives, Atenolol, Erythromycin, Erythromycin base, Lactose intolerance (gi), and Plavix [clopidogrel bisulfate]    Social History:  The patient  reports that she has never smoked. She has never used smokeless tobacco. She reports that she does not drink alcohol or use drugs.   Family History:  The patient's family history includes Atrial fibrillation in her mother; Diabetes in her mother; Emphysema in her  father; Hypertension in her mother; Other in her brother.    ROS:  Please see the history of present illness.   Otherwise, review of systems are positive for none.   All other systems are reviewed and negative.    Physical Exam:  Physical Exam: Last menstrual period 07/22/2010.  GEN:  Middle age female,  Moderately obese Body mass index is 42.79 kg/m.  HEENT: Normal NECK: No JVD; No carotid bruits LYMPHATICS: No lymphadenopathy CARDIAC: RRR , no murmurs, rubs, gallops RESPIRATORY:  Clear to auscultation without rales, wheezing or rhonchi  ABDOMEN: Soft, non-tender, non-distended MUSCULOSKELETAL:  No edema; No deformity  SKIN: Warm and dry NEUROLOGIC:  Alert and oriented x 3    EKG:     June 12, 2019: Normal sinus rhythm at 74.  Nonspecific ST and T wave changes.   Recent Labs: 12/25/2018: ALT 20 12/27/2018: BUN 7; Creatinine, Ser 0.88; Hemoglobin 13.1; Magnesium 1.8; Platelets 252; Potassium 3.7; Sodium 142    Lipid Panel    Component Value Date/Time   CHOL 145 02/28/2018 0954   TRIG 53 02/28/2018 0954   HDL 64 02/28/2018 0954   CHOLHDL 2.3 02/28/2018 0954   CHOLHDL 3.4 07/02/2015 0737   VLDL 16 07/02/2015 0737   LDLCALC 70 02/28/2018 0954      Wt Readings from Last 3 Encounters:  02/15/19 267 lb (121.1 kg)  12/26/18 267 lb 3.2 oz (121.2 kg)  08/29/18 279 lb (126.6 kg)      Other studies Reviewed: Additional studies/ records that were reviewed today include: . Review of the above records demonstrates:    ASSESSMENT AND PLAN:  1.  Paroxysmal atrial fib:  Is continued to have some episodes of paroxysmal atrial fibrillation/atrial flutter.  He is last for only a few minutes.  They typically resolve with as needed propranolol.  She is on diltiazem.  She remains on Eliquis 5 mg twice a day.  2. Hypertension: Blood pressure is fairly well controlled.  Continue current medications.  3. Hyperlipidemia: Last lipid lipid levels from her primary medical  doctor look good.  4. Obesity:    She will continue with her weight loss efforts.  It is clear that she still eating lots of foods that should not be on a diet including hot dogs.  Ive  encouraged her to not purchase hot dogs.   Current medicines are reviewed at length with the patient today.  The patient does not have concerns regarding medicines.  The following changes have been made:  no change  Labs/ tests ordered today include:   No orders of the defined types were placed in this encounter.  Disposition:   1 year office visit    Signed, Mertie Moores, MD  06/12/2019 [facility] [place], [place], [place]  [address] Phone: [phone]; Fax: [phone]

## 2019-06-12 NOTE — Patient Instructions (Signed)
Medication Instructions:  Your physician recommends that you continue on your current medications as directed. Please refer to the Current Medication list given to you today.  *If you need a refill on your cardiac medications before your next appointment, please call your pharmacy*  Lab Work: None Ordered   Testing/Procedures: None Ordered   Follow-Up: At [facility], you and your health needs are our priority.  As part of our continuing mission to provide you with exceptional heart care, we have created designated Provider Care Teams.  These Care Teams include your primary Cardiologist (physician) and Advanced Practice Providers (APPs -  Physician Assistants and Nurse Practitioners) who all work together to provide you with the care you need, when you need it.  Your next appointment:   1 year(s)  The format for your next appointment:   In Person  Provider:   You may see Mertie Moores, MD or one of the following Advanced Practice Providers on your designated Care Team:    Richardson Dopp, PA-C  [place], [place]  Daune Perch, [place]

## 2019-07-03 ENCOUNTER — Other Ambulatory Visit: Payer: Self-pay | Admitting: Certified Nurse Midwife

## 2019-07-03 DIAGNOSIS — Z1231 Encounter for screening mammogram for malignant neoplasm of breast: Secondary | ICD-10-CM

## 2019-07-08 ENCOUNTER — Other Ambulatory Visit: Payer: Self-pay | Admitting: Cardiovascular Disease

## 2019-08-11 ENCOUNTER — Ambulatory Visit: Payer: Medicare PPO

## 2019-08-18 ENCOUNTER — Ambulatory Visit: Payer: Medicare PPO | Admitting: Certified Nurse Midwife

## 2019-08-28 ENCOUNTER — Encounter: Payer: Self-pay | Admitting: Certified Nurse Midwife

## 2019-08-28 ENCOUNTER — Ambulatory Visit (INDEPENDENT_AMBULATORY_CARE_PROVIDER_SITE_OTHER): Payer: Medicare PPO | Admitting: Certified Nurse Midwife

## 2019-08-28 ENCOUNTER — Other Ambulatory Visit: Payer: Self-pay

## 2019-08-28 VITALS — BP 120/80 | HR 70 | Temp 97.3°F | Resp 16 | Ht 67.75 in | Wt 285.0 lb

## 2019-08-28 DIAGNOSIS — N951 Menopausal and female climacteric states: Secondary | ICD-10-CM | POA: Diagnosis not present

## 2019-08-28 DIAGNOSIS — Z8679 Personal history of other diseases of the circulatory system: Secondary | ICD-10-CM | POA: Diagnosis not present

## 2019-08-28 DIAGNOSIS — Z780 Asymptomatic menopausal state: Secondary | ICD-10-CM

## 2019-08-28 DIAGNOSIS — Z01419 Encounter for gynecological examination (general) (routine) without abnormal findings: Secondary | ICD-10-CM | POA: Diagnosis not present

## 2019-08-28 NOTE — Progress Notes (Signed)
74 y.o. G25P2002 Married  Caucasian Fe here for annual exam. Post menopausal  No HRT. Denies vaginal bleeding or vaginal dryness. Sees Dr.Elkins for aex,labs, hypertension, cholesterol management. Sees cardiology for Eliquis management to atrial fib. Feeling well and working with grandchild with school work. Working on weight control. No other health issues today.  Patient's last menstrual period was 07/22/2010.          Sexually active: Yes.    The current method of family planning is status post hysterectomy.    Exercising: Yes.    stationary bike Smoker:  no  Review of Systems  Constitutional: Negative.   HENT: Negative.   Eyes: Negative.   Respiratory: Negative.   Cardiovascular: Negative.   Gastrointestinal: Negative.   Genitourinary: Negative.   Musculoskeletal: Negative.   Skin: Negative.   Neurological: Negative.   Endo/Heme/Allergies: Negative.   Psychiatric/Behavioral: Negative.     Health Maintenance: Pap:  01-02-10 rare ASCUS H colpo done 2011 neg History of Abnormal Pap: yes MMG: 08-10-2018 category b density birads 1:neg Self Breast exams: occ Colonoscopy: 07/2018  BMD:  2020 normal per patient TDaP: 2018 Shingles: 2019 Pneumonia: 2017 Hep C and HIV: hep c neg per patient Labs: no   reports that she has never smoked. She has never used smokeless tobacco. She reports that she does not drink alcohol or use drugs.  Past Medical History:  Diagnosis Date  . Abnormal Pap smear    Rare ASCUS-H  . Arthritis   . Atrial fibrillation ([place])   . Cancer (HCC)    melanoma rt leg  . Chest pain   . Diabetes mellitus without complication ([place])   . Diverticulitis   . HTN (hypertension) 07/02/2015  . Hypercholesteremia   . Hyperglycemia 02/04/2011  . Hyperlipidemia 02/04/2011  . Hypertension    per pt no htn but needs meds for other reason  . Hypokalemia 05/17/2017  . Injury of right popliteal artery 05/17/2017  . Knee dislocation 05/17/2017  . Leg edema 02/04/2011  . Leg  swelling 02/02/2011  . Leukocytosis 05/17/2017  . Obesity   . PAF (paroxysmal atrial fibrillation) ([place]) 02/02/2011  . Perforated sigmoid colon ([place])    from diverticulitis  . Popliteal artery injury 05/17/2017    Past Surgical History:  Procedure Laterality Date  . BYPASS GRAFT POPLITEAL TO POPLITEAL Right 05/17/2017   Procedure: BYPASS GRAFT ABOVE KNEE POPLITEAL TO BELOW KNEE POPLITEAL USING NONREVERSED RIGHT GREATER SAPHENOUS VEIN;  Surgeon: Waynetta Sandy, MD;  Location: [facility];  Service: Vascular;  Laterality: Right;  . CARDIAC CATHETERIZATION  07/11/2010   Est. EF of 60-65% -- Smooth and normal coronary arteries  -- Normal LV systolic function   . CATARACT EXTRACTION, BILATERAL     08-12-17, 08-26-17  . CHOLECYSTECTOMY    . CYSTOCELE REPAIR  07/22/2010   Vault suspension, cystocele repair, graft and cystoscopy  . FINGER SURGERY    . MELANOMA EXCISION     right leg 3/12  . NASAL SINUS SURGERY    . NEUROMA SURGERY     rt foot  . TONSILLECTOMY    . TOTAL KNEE ARTHROPLASTY  06/20/2009   left knee  . TOTAL KNEE ARTHROPLASTY  12/27/2008   right knee  . TOTAL VAGINAL HYSTERECTOMY  07/22/2010   with bilateral salpingo-oophorectomy by Dr. Joan Flores  . VEIN HARVEST Right 05/17/2017   Procedure: RIGHT GREATER SAPHENOUS VEIN HARVEST;  Surgeon: Waynetta Sandy, MD;  Location: [facility];  Service: Vascular;  Laterality: Right;  Current Outpatient Medications  Medication Sig Dispense Refill  . aspirin EC 81 MG tablet Take 81 mg by mouth See admin instructions. Lydia Guiles, thur, sat    . Calcium Carbonate-Vit D-Min (GNP CALCIUM PLUS 600 +D PO) Take 1 tablet by mouth every other day.     . diltiazem (CARDIZEM CD) 240 MG 24 hr capsule TAKE 1 CAPSULE BY MOUTH DAILY. 90 capsule 3  . ELIQUIS 5 MG TABS tablet TAKE 1 TABLET BY MOUTH 2 TIMES DAILY. 60 tablet 6  . Flaxseed, Linseed, (FLAX SEED OIL) 1000 MG CAPS Take by mouth.    . loratadine (CLARITIN) 10 MG tablet Take 10 mg by  mouth daily.    . mometasone (NASONEX) 50 MCG/ACT nasal spray Place 1 spray into the nose daily as needed for congestion.  0  . multivitamin (THERAGRAN) per tablet Take 1 tablet by mouth daily.      . Omega-3 Fatty Acids (FISH OIL) 1000 MG CAPS Take by mouth.    Marland Kitchen omeprazole (PRILOSEC) 20 MG capsule     . PATADAY 0.2 % SOLN Place 1 drop into both eyes daily.   4  . potassium chloride (KLOR-CON) 10 MEQ tablet TAKE 1 TABLET BY MOUTH DAILY. 90 tablet 1  . propranolol (INDERAL) 10 MG tablet Take 1 tablet (10 mg total) by mouth 4 (four) times daily as needed (palpitations or fast heart rate). 60 tablet 6  . psyllium (METAMUCIL) 58.6 % packet Take 1 packet by mouth daily.    Marland Kitchen triamterene-hydrochlorothiazide (MAXZIDE) 75-50 MG tablet Take 1.5 tablets by mouth daily.      No current facility-administered medications for this visit.    Family History  Problem Relation Age of Onset  . Emphysema Father   . Atrial fibrillation Mother        has pacemaker  . Diabetes Mother   . Hypertension Mother   . Other Brother        bulbar palsy    ROS:  Pertinent items are noted in HPI.  Otherwise, a comprehensive ROS was negative.  Exam:   BP 120/80   Pulse 70   Temp (!) 97.3 F (36.3 C) (Skin)   Resp 16   Ht 5' 7.75" (1.721 m)   Wt 285 lb (129.3 kg)   LMP 07/22/2010   BMI 43.65 kg/m  Height: 5' 7.75" (172.1 cm) Ht Readings from Last 3 Encounters:  08/28/19 5' 7.75" (1.721 m)  06/12/19 5\' 8"  (1.727 m)  02/15/19 5\' 8"  (1.727 m)    General appearance: alert, cooperative and appears stated age Head: Normocephalic, without obvious abnormality, atraumatic Neck: no adenopathy, supple, symmetrical, trachea midline and thyroid normal to inspection and palpation Lungs: clear to auscultation bilaterally Breasts: normal appearance, no masses or tenderness, No nipple retraction or dimpling, No nipple discharge or bleeding, No axillary or supraclavicular adenopathy Heart: regular rate and  rhythm Abdomen: soft, non-tender; no masses,  no organomegaly Extremities: extremities normal, atraumatic, no cyanosis or edema Skin: Skin color, texture, turgor normal. No rashes or lesions Lymph nodes: Cervical, supraclavicular, and axillary nodes normal. No abnormal inguinal nodes palpated Neurologic: Grossly normal   Pelvic: External genitalia:  no lesions              Urethra:  normal appearing urethra with no masses, tenderness or lesions              Bartholin's and Skene's: normal  Vagina: normal appearing vagina with normal color and discharge, no lesions              Cervix: absent              Pap taken: No. Bimanual Exam:  Uterus:  uterus absent              Adnexa: no mass, fullness, tenderness and adnexa surgically absent               Rectovaginal: Confirms               Anus:  normal sphincter tone, no lesions  Chaperone present: yes  A:  Well Woman with normal exam  Post menopausal s/p TVH with BSO  Hypertension/Eliquis/Cardizem management with MD  Overweight  P:   Reviewed health and wellness pertinent to exam  Aware to advise if vaginal dryness issues.  Continue follow up with MD as indicated  Encouraged to continue to eat healthy and reduce portion size  Pap smear: no   counseled on breast self exam, mammography screening, feminine hygiene, osteoporosis, adequate intake of calcium and vitamin D, diet and exercise, Kegel's exercises  return annually or prn  An After Visit Summary was printed and given to the patient.

## 2019-08-28 NOTE — Patient Instructions (Signed)

## 2019-08-29 ENCOUNTER — Encounter: Payer: Self-pay | Admitting: Certified Nurse Midwife

## 2019-09-05 ENCOUNTER — Ambulatory Visit: Payer: Medicare PPO

## 2019-09-18 ENCOUNTER — Ambulatory Visit
Admission: RE | Admit: 2019-09-18 | Discharge: 2019-09-18 | Disposition: A | Discharge status: Home / Self Care | Payer: Medicare PPO | Source: Ambulatory Visit | Attending: Certified Nurse Midwife | Admitting: Certified Nurse Midwife

## 2019-09-18 ENCOUNTER — Other Ambulatory Visit: Payer: Self-pay

## 2019-09-18 DIAGNOSIS — Z1231 Encounter for screening mammogram for malignant neoplasm of breast: Secondary | ICD-10-CM

## 2019-09-20 ENCOUNTER — Other Ambulatory Visit: Payer: Self-pay | Admitting: Cardiovascular Disease

## 2019-10-20 ENCOUNTER — Other Ambulatory Visit: Payer: Self-pay | Admitting: Cardiovascular Disease

## 2019-10-20 NOTE — Telephone Encounter (Signed)
Pt last saw Dr Acie Fredrickson 06/12/19, last labs 05/29/19 Creat 0.82, age 74, weight 129.3kg, based on specified criteria pt is on appropriate dosage of Eliquis 5mg  BID.  Will refill rx.

## 2020-01-06 ENCOUNTER — Emergency Department (HOSPITAL_BASED_OUTPATIENT_CLINIC_OR_DEPARTMENT_OTHER): Payer: Medicare PPO

## 2020-01-06 ENCOUNTER — Encounter (HOSPITAL_COMMUNITY): Payer: Self-pay | Admitting: Emergency Medicine

## 2020-01-06 ENCOUNTER — Telehealth: Payer: Self-pay | Admitting: Vascular Surgery

## 2020-01-06 ENCOUNTER — Other Ambulatory Visit: Payer: Self-pay

## 2020-01-06 ENCOUNTER — Emergency Department (HOSPITAL_COMMUNITY)
Admission: EM | Admit: 2020-01-06 | Discharge: 2020-01-06 | Disposition: A | Discharge status: Home / Self Care | Payer: Medicare PPO | Attending: Emergency Medicine | Admitting: Emergency Medicine

## 2020-01-06 DIAGNOSIS — I82451 Acute embolism and thrombosis of right peroneal vein: Secondary | ICD-10-CM

## 2020-01-06 DIAGNOSIS — Z5321 Procedure and treatment not carried out due to patient leaving prior to being seen by health care provider: Secondary | ICD-10-CM | POA: Diagnosis not present

## 2020-01-06 DIAGNOSIS — M79604 Pain in right leg: Secondary | ICD-10-CM | POA: Insufficient documentation

## 2020-01-06 DIAGNOSIS — W57XXXA Bitten or stung by nonvenomous insect and other nonvenomous arthropods, initial encounter: Secondary | ICD-10-CM | POA: Diagnosis not present

## 2020-01-06 DIAGNOSIS — Y92007 Garden or yard of unspecified non-institutional (private) residence as the place of occurrence of the external cause: Secondary | ICD-10-CM | POA: Diagnosis not present

## 2020-01-06 DIAGNOSIS — Y990 Civilian activity done for income or pay: Secondary | ICD-10-CM | POA: Diagnosis not present

## 2020-01-06 DIAGNOSIS — Y939 Activity, unspecified: Secondary | ICD-10-CM | POA: Insufficient documentation

## 2020-01-06 DIAGNOSIS — M79609 Pain in unspecified limb: Secondary | ICD-10-CM | POA: Diagnosis not present

## 2020-01-06 DIAGNOSIS — M7989 Other specified soft tissue disorders: Secondary | ICD-10-CM | POA: Diagnosis not present

## 2020-01-06 LAB — URINALYSIS, ROUTINE W REFLEX MICROSCOPIC
Bilirubin Urine: NEGATIVE
Glucose, UA: NEGATIVE mg/dL
Hgb urine dipstick: NEGATIVE
Ketones, ur: NEGATIVE mg/dL
Nitrite: NEGATIVE
Protein, ur: NEGATIVE mg/dL
Specific Gravity, Urine: 1.016 (ref 1.005–1.030)
pH: 6 (ref 5.0–8.0)

## 2020-01-06 LAB — CBC WITH DIFFERENTIAL/PLATELET
Abs Immature Granulocytes: 0.04 10*3/uL (ref 0.00–0.07)
Basophils Absolute: 0 10*3/uL (ref 0.0–0.1)
Basophils Relative: 0 %
Eosinophils Absolute: 0.1 10*3/uL (ref 0.0–0.5)
Eosinophils Relative: 1 %
HCT: 46.8 % — ABNORMAL HIGH (ref 36.0–46.0)
Hemoglobin: 15.1 g/dL — ABNORMAL HIGH (ref 12.0–15.0)
Immature Granulocytes: 0 %
Lymphocytes Relative: 9 %
Lymphs Abs: 1.3 10*3/uL (ref 0.7–4.0)
MCH: 29.8 pg (ref 26.0–34.0)
MCHC: 32.3 g/dL (ref 30.0–36.0)
MCV: 92.5 fL (ref 80.0–100.0)
Monocytes Absolute: 0.9 10*3/uL (ref 0.1–1.0)
Monocytes Relative: 6 %
Neutro Abs: 12.6 10*3/uL — ABNORMAL HIGH (ref 1.7–7.7)
Neutrophils Relative %: 84 %
Platelets: 246 10*3/uL (ref 150–400)
RBC: 5.06 MIL/uL (ref 3.87–5.11)
RDW: 13.9 % (ref 11.5–15.5)
WBC: 15 10*3/uL — ABNORMAL HIGH (ref 4.0–10.5)
nRBC: 0 % (ref 0.0–0.2)

## 2020-01-06 LAB — LACTIC ACID, PLASMA: Lactic Acid, Venous: 1.9 mmol/L (ref 0.5–1.9)

## 2020-01-06 LAB — COMPREHENSIVE METABOLIC PANEL
ALT: 24 U/L (ref 0–44)
AST: 25 U/L (ref 15–41)
Albumin: 3.9 g/dL (ref 3.5–5.0)
Alkaline Phosphatase: 69 U/L (ref 38–126)
Anion gap: 12 (ref 5–15)
BUN: 19 mg/dL (ref 8–23)
CO2: 27 mmol/L (ref 22–32)
Calcium: 9.5 mg/dL (ref 8.9–10.3)
Chloride: 100 mmol/L (ref 98–111)
Creatinine, Ser: 0.88 mg/dL (ref 0.44–1.00)
GFR calc Af Amer: >60 mL/min (ref >60)
GFR calc non Af Amer: >60 mL/min (ref >60)
Glucose, Bld: 155 mg/dL — ABNORMAL HIGH (ref 70–99)
Potassium: 3.1 mmol/L — ABNORMAL LOW (ref 3.5–5.1)
Sodium: 139 mmol/L (ref 135–145)
Total Bilirubin: 0.8 mg/dL (ref 0.3–1.2)
Total Protein: 6.7 g/dL (ref 6.5–8.1)

## 2020-01-06 MED ORDER — SODIUM CHLORIDE 0.9% FLUSH: 3.0000 mL | Freq: Once | INTRAVENOUS | Status: DC

## 2020-01-06 NOTE — Progress Notes (Signed)
VASCULAR LAB PRELIMINARY  PRELIMINARY  PRELIMINARY  PRELIMINARY  Right lower extremity venous duplex completed.    Preliminary report:  See CV proc for preliminary results.  Meli Faley, RVT 01/06/2020, 8:44 AM

## 2020-01-06 NOTE — Telephone Encounter (Signed)
This patient had been waiting in the emergency department for 9 hours because of some swelling in her right leg.  She had waited from 4 AM until 8 when she got her venous duplex scan.  She was still waiting in the ER and was frustrated therefore she called me as I was on call.  I was able to look at the results of her duplex scan which showed that she only had clot in the right peroneal vein.  In addition she tells me that she is on Eliquis (5 mg twice daily) for atrial fibrillation.  This reason I felt that it would be reasonable for her to be seen in the office early next week with a follow-up duplex to rule out any propagation of the clot.  In addition I gave her very specific instructions on how to elevate her leg effectively.

## 2020-01-06 NOTE — ED Notes (Signed)
Pt very upset she has been waiting too long and doesn't want to be seen. Said Psychologist, sport and exercise isn't concerned about where it is and will see her in his office first of the week. EMT stated she has a room, but left anyway.

## 2020-01-06 NOTE — ED Triage Notes (Signed)
Pt in w/R leg pain, mostly to R posterior calf and thigh. Hx of R popliteal bypass in 18'. Strong R pedal pulse present. States [name] was out doing yard work a few days ago, had gotten bitten by mosquitoes to RLE. Calf and posterior thigh area are red, painful and warm to touch, denies any drainage to area. Reports no hx of blood clots, no sob or cp noted. Temp 99.0

## 2020-01-06 NOTE — ED Provider Notes (Signed)
This patient was not seen by me.  She was assigned to an examination room but never entered it.  She received her ultrasound imaging which showed a right peroneal vein DVT.  She discussed these results with her vascular surgeon, Dr. Scot Dock who will see the patient in the office, in a couple of days for further evaluation and treatment as needed.   Daleen Bo, MD 01/06/20 1326

## 2020-01-08 ENCOUNTER — Other Ambulatory Visit: Payer: Self-pay

## 2020-01-08 ENCOUNTER — Telehealth: Payer: Self-pay | Admitting: Cardiovascular Disease

## 2020-01-08 ENCOUNTER — Telehealth: Payer: Self-pay | Admitting: *Deleted

## 2020-01-08 DIAGNOSIS — I824Y9 Acute embolism and thrombosis of unspecified deep veins of unspecified proximal lower extremity: Secondary | ICD-10-CM

## 2020-01-08 NOTE — Telephone Encounter (Signed)
Returned call to patient.  Patient was inquiring if she could increase her activity today and if there is a need to change her aspirin dose. Her f/u appointment is tomorrow, January 09, 2020 on the PA schedule. I advised the patient to follow Dr Nicole Cella instructions regarding activity and proper elevation of her leg.  Patient voiced understanding of the instructions.

## 2020-01-08 NOTE — Telephone Encounter (Signed)
Patient calling stating she has a DVT in right leg. She states she has an appointment tomorrow with [facility]. She would like to speak with a nurse on whether her medications need to be changed.

## 2020-01-08 NOTE — Telephone Encounter (Signed)
Spoke with the patient who states that she went to the ER on Saturday and they found a DVT in her right leg. She spoke with her vascular surgeon's office and will be following up with them tomorrow and is having a second doppler done. She states that she had previously only been taking her ASA on Monday, Wednesday and Fridays. She has returned to taking it daily. She also continues on Eliquis 5 mg BID. She states that she just wanted to make Dr. Acie Fredrickson aware.

## 2020-01-08 NOTE — Telephone Encounter (Signed)
Will see what the doctors at [facility] suggest.  She  may do better with Xarelto if she has truly failed Eliquis

## 2020-01-09 ENCOUNTER — Other Ambulatory Visit: Payer: Self-pay

## 2020-01-09 ENCOUNTER — Ambulatory Visit (HOSPITAL_COMMUNITY)
Admission: RE | Admit: 2020-01-09 | Discharge: 2020-01-09 | Disposition: A | Discharge status: Home / Self Care | Payer: Medicare PPO | Source: Ambulatory Visit | Attending: Surgery | Admitting: Surgery

## 2020-01-09 ENCOUNTER — Ambulatory Visit: Payer: Medicare PPO | Admitting: Physician Assistant

## 2020-01-09 VITALS — BP 124/82 | HR 81 | Temp 97.4°F | Resp 20 | Ht 67.5 in | Wt 278.6 lb

## 2020-01-09 DIAGNOSIS — I824Y9 Acute embolism and thrombosis of unspecified deep veins of unspecified proximal lower extremity: Secondary | ICD-10-CM | POA: Insufficient documentation

## 2020-01-09 DIAGNOSIS — M79661 Pain in right lower leg: Secondary | ICD-10-CM | POA: Diagnosis not present

## 2020-01-09 NOTE — Progress Notes (Signed)
[facility]           [facility] [place]  History and Physical   [name] is a 74 y.o. female who presents for follow up to a study she had in the ER over the weekend for pain and tenderness and swelling in the RLE.  U/s there revealed thrombus in the peroneal vein on the right.   She states that she was at [employer] and had been working in the yard.  She states that she very well could have bumped her leg on the tractor and not remember.  She states that she was on and off the tractor and spreading dirt in the yard.  She states that she got a bite on the back of her leg and then pain started and continued to get worse.  She states that she eventually talked with Dr. Scot Dock on the phone and he instructed her to elevate her legs.  She states that she has been very vigilant in elevating her legs and the pain and swelling have improved.  She has not had any chest pain or shortness of breath.   She has been wearing her compression socks regularly.    She is on Eliquis for PAF.  She states that she has not missed any doses.     She is followed in our office for hx of right above knee to below knee popliteal artery bypass with non reversed GSV on 05/17/17 by Dr. Donzetta Matters for acute RLE ischemia after a fall from standing on ice resulting in a knee dislocation.  She had normal ABI's 11 months ago.    The pt is not on a statin for cholesterol management.  The pt is on a daily aspirin.   Other AC:  Eliquis The pt is on CCB, BB for hypertension.   The pt is not diabetic.   Tobacco hx:  never   Past Medical History:  Diagnosis Date   Abnormal Pap smear    Rare ASCUS-H   Arthritis    Atrial fibrillation ([place])    Cancer (HCC)    melanoma rt leg   Chest pain    Diabetes mellitus without complication ([place])    Diverticulitis    HTN (hypertension) 07/02/2015   Hypercholesteremia    Hyperglycemia 02/04/2011   Hyperlipidemia 02/04/2011   Hypertension    per  pt no htn but needs meds for other reason   Hypokalemia 05/17/2017   Injury of right popliteal artery 05/17/2017   Knee dislocation 05/17/2017   Leg edema 02/04/2011   Leg swelling 02/02/2011   Leukocytosis 05/17/2017   Obesity    PAF (paroxysmal atrial fibrillation) ([place]) 02/02/2011   Perforated sigmoid colon ([place])    from diverticulitis   Popliteal artery injury 05/17/2017    Past Surgical History:  Procedure Laterality Date   BYPASS GRAFT POPLITEAL TO POPLITEAL Right 05/17/2017   Procedure: BYPASS GRAFT ABOVE KNEE POPLITEAL TO BELOW KNEE POPLITEAL USING NONREVERSED RIGHT GREATER SAPHENOUS VEIN;  Surgeon: Waynetta Sandy, MD;  Location: [place] [facility];  Service: Vascular;  Laterality: Right;   CARDIAC CATHETERIZATION  07/11/2010   Est. EF of 60-65% -- Smooth and normal coronary arteries  -- Normal LV systolic function    CATARACT EXTRACTION, BILATERAL     08-12-17, 08-26-17   CHOLECYSTECTOMY     CYSTOCELE REPAIR  07/22/2010   Vault suspension, cystocele repair, graft and cystoscopy   FINGER SURGERY  MELANOMA EXCISION     right leg 3/12   NASAL SINUS SURGERY     NEUROMA SURGERY     rt foot   TONSILLECTOMY     TOTAL KNEE ARTHROPLASTY  06/20/2009   left knee   TOTAL KNEE ARTHROPLASTY  12/27/2008   right knee   TOTAL VAGINAL HYSTERECTOMY  07/22/2010   with bilateral salpingo-oophorectomy by Dr. Joan Flores   VEIN HARVEST Right 05/17/2017   Procedure: [place];  Surgeon: Waynetta Sandy, MD;  [facility]: [facility] OR;  Service: Vascular;  Laterality: Right;    Social History   Socioeconomic History   Marital status: Married    Spouse name: Not on file   Number of children: Not on file   Years of education: Not on file   Highest education level: Not on file  Occupational History   Not on file  Tobacco Use   Smoking status: Never Smoker   Smokeless tobacco: Never Used  Vaping Use   Vaping Use: Never used   Substance and Sexual Activity   Alcohol use: No   Drug use: No   Sexual activity: Yes    Partners: Male    Birth control/protection: Surgical    Comment: LAVH  Other Topics Concern   Not on file  Social History Narrative   Not on file   Social Determinants of Health   Financial Resource Strain:    Difficulty of Paying Living Expenses:   Food Insecurity:    Worried About Charity fundraiser in the Last Year:    Arboriculturist in the Last Year:   Transportation Needs:    Film/video editor (Medical):    Lack of Transportation (Non-Medical):   Physical Activity:    Days of Exercise per Week:    Minutes of Exercise per Session:   Stress:    Feeling of Stress :   Social Connections:    Frequency of Communication with Friends and Family:    Frequency of Social Gatherings with Friends and Family:    Attends Religious Services:    Active Member of Clubs or Organizations:    Attends Music therapist:    Marital Status:   Intimate Partner Violence:    Fear of Current or Ex-Partner:    Emotionally Abused:    Physically Abused:    Sexually Abused:      Family History  Problem Relation Age of Onset   Emphysema Father    Atrial fibrillation Mother        has pacemaker   Diabetes Mother    Hypertension Mother    Other Brother        bulbar palsy    Current Outpatient Medications  Medication Sig Dispense Refill   aspirin EC 81 MG tablet Take 81 mg by mouth See admin instructions. Sun, tues, thur, sat     Calcium Carbonate-Vit D-Min (GNP CALCIUM PLUS 600 +D PO) Take 1 tablet by mouth every other day.      diltiazem (CARDIZEM CD) 240 MG 24 hr capsule TAKE 1 CAPSULE BY MOUTH DAILY. 90 capsule 3   ELIQUIS 5 MG TABS tablet TAKE 1 TABLET BY MOUTH 2 TIMES DAILY. 60 tablet 6   Flaxseed, Linseed, (FLAX SEED OIL) 1000 MG CAPS Take by mouth.     loratadine (CLARITIN) 10 MG tablet Take 10 mg by mouth daily.     mometasone  (NASONEX) 50 MCG/ACT nasal spray Place 1 spray into the nose daily as  needed for congestion.  0   multivitamin (THERAGRAN) per tablet Take 1 tablet by mouth daily.       Omega-3 Fatty Acids (FISH OIL) 1000 MG CAPS Take by mouth.     omeprazole (PRILOSEC) 20 MG capsule      PATADAY 0.2 % SOLN Place 1 drop into both eyes daily.   4   potassium chloride (KLOR-CON) 10 MEQ tablet TAKE 1 TABLET BY MOUTH DAILY. 90 tablet 2   propranolol (INDERAL) 10 MG tablet Take 1 tablet (10 mg total) by mouth 4 (four) times daily as needed (palpitations or fast heart rate). 60 tablet 6   psyllium (METAMUCIL) 58.6 % packet Take 1 packet by mouth daily.     triamterene-hydrochlorothiazide (MAXZIDE) 75-50 MG tablet Take 1.5 tablets by mouth daily.      No current facility-administered medications for this visit.    Allergies  Allergen Reactions   Sulfa Antibiotics Other (See Comments)    Turned eyes yellow    Sulfonamide Derivatives Rash    Rash   Atenolol Cough   Erythromycin     Nausea   Erythromycin Base Nausea Only   Lactose Intolerance (Gi) Diarrhea    Just MILK    Plavix [Clopidogrel Bisulfate] Cough    cough    REVIEW OF SYSTEMS:   [X]  denotes positive finding, [ ]  denotes negative finding Cardiac  Comments:  Chest pain or chest pressure:    Shortness of breath upon exertion:    Short of breath when lying flat:    Irregular heart rhythm: x       Vascular    Pain in calf, thigh, or hip brought on by ambulation:    Pain in feet at night that wakes you up from your sleep:     Blood clot in your veins:    Leg swelling:  x       Pulmonary    Oxygen at home:    Productive cough:     Wheezing:         Neurologic    Sudden weakness in arms or legs:     Sudden numbness in arms or legs:     Sudden onset of difficulty speaking or slurred speech:    Temporary loss of vision in one eye:     Problems with dizziness:         Gastrointestinal    Blood in stool:     Vomited  blood:         Genitourinary    Burning when urinating:     Blood in urine:        Psychiatric    Major depression:         Hematologic    Bleeding problems:    Problems with blood clotting too easily:        Skin    Rashes or ulcers:        Constitutional    Fever or chills:      PHYSICAL EXAMINATION:  Today's Vitals   01/09/20 1338  BP: 124/82  Pulse: 81  Resp: 20  Temp: (!) 97.4 F (36.3 C)  TempSrc: Temporal  SpO2: 98%  Weight: 278 lb 9.6 oz (126.4 kg)  Height: 5' 7.5" (1.715 m)  PainSc: 0-No pain   Body mass index is 42.99 kg/m.   General:  WDWN in NAD; vital signs documented above Gait: Not observed HENT: WNL, normocephalic Pulmonary: normal non-labored breathing without wheezing Cardiac: regular HR; without carotid bruits Abdomen: soft,  NT, no masses Skin: without rashes Vascular Exam/Pulses:  Right Left  Radial 2+ (normal) 2+ (normal)  DP 2+ (normal) 2+ (normal)  PT Unable to palpate Unable to palpate   Extremities: without ischemic changes, without cellulitis; without open wounds;  She has mild swelling of the RLE with some color changes distally.  There is a thigh high compression sock on the left leg.  Musculoskeletal: no muscle wasting or atrophy  Neurologic: A&O X 3;  moving all extremities equally Psychiatric:  The pt has Normal affect.   Non-Invasive Vascular Imaging:   Venous duplex on 01/09/2020: RIGHT:  - There is no evidence of deep vein thrombosis in the lower extremity.  - There is no evidence of deep vein thrombosis proximal to the inguinal  ligament or in the common femoral vein.    - Previous peroneal vein thrombus appears resolved.   Previous venous duplex on 01/06/2020: RIGHT:  - Findings consistent with acute deep vein thrombosis involving the right  peroneal veins.  - No cystic structure found in the popliteal fossa.    LEFT:  - No evidence of common femoral vein obstruction   [name] is a 74 y.o.  female who presents with: recent diagnosis of right peroneal vein thrombosis.   -duplex today reveals resolution of the right peroneal vein thrombosis.  Her pain is much better.  She did develop this while taking Elquis for afib.  She was working in the yard and developed a bug bite.  She also is not sure if she knocked her leg on the tractor while out working in the yard.   -I discussed with Dr. Donnetta Hutching and will continue her Eliquis and compression and elevation.  I discussed with pt that she very well could have developed this from trauma from working in the yard.  I also discussed with her that if she develops another blood clot, she would need a hematologic work up.   -she does not have a follow up appt for her RLE bypass graft so I will have her return in 4-6 weeks to see Dr. Donzetta Matters with ABI and RLE arterial duplex.  He can also discuss with her at that time if he feels she needs further work up.   -pt does have a couple of trips planned for the beach in the next 4 weeks.  I discussed with pt to wear her compression while travelling and stop and get out every 45 minutes to an hour and walk around for a few minutes.   -she knows that if she develops any chest pain or shortness of breath that she should go to the ER.  If she has any other concerns prior to her next visit with Dr. Donzetta Matters she will contact us sooner.   Leontine Locket, [facility] 01/09/2020 1:43 PM  Clinic MD:  Early

## 2020-01-11 ENCOUNTER — Other Ambulatory Visit: Payer: Self-pay | Admitting: *Deleted

## 2020-01-11 DIAGNOSIS — I872 Venous insufficiency (chronic) (peripheral): Secondary | ICD-10-CM

## 2020-01-29 ENCOUNTER — Telehealth: Payer: Self-pay | Admitting: Cardiovascular Disease

## 2020-01-29 DIAGNOSIS — E782 Mixed hyperlipidemia: Secondary | ICD-10-CM

## 2020-01-29 DIAGNOSIS — I824Y9 Acute embolism and thrombosis of unspecified deep veins of unspecified proximal lower extremity: Secondary | ICD-10-CM

## 2020-01-29 DIAGNOSIS — I10 Essential (primary) hypertension: Secondary | ICD-10-CM

## 2020-01-29 NOTE — Telephone Encounter (Signed)
    Pt would like to speak with RN Sharyn Lull. She said if she can call her back tomorrow

## 2020-01-30 MED ORDER — POTASSIUM CHLORIDE CRYS ER 20 MEQ PO TBCR
10.0000 meq | EXTENDED_RELEASE_TABLET | Freq: Every day | ORAL | 3 refills | Status: DC
Start: 2020-01-30 — End: 2021-01-14

## 2020-01-30 NOTE — Telephone Encounter (Signed)
Returned call to patient who called with questions about taking a different type of potassium pill. She states she is seeing her current potassium chloride pill in her stool and her pharmacist advised her to ask [name] to change her Rx to the 20 mEq tablet which is not coated. I asked about recent low K+ from blood work done 7/31. She states her PCP rechecked labs and advised that K+ level is "normal." She does not know her K+ level from that visit. I advised her to increase intake of high potassium foods and gave her a list of foods that she likes in addition to taking 1/2 of Kdur 20 mEq tablet daily. I scheduled her for repeat bmet on 9/10 and advised we will readjust if needed after that time. She verbalized understanding and agreement and thanked me for the call.

## 2020-01-31 ENCOUNTER — Telehealth: Payer: Self-pay | Admitting: Cardiovascular Disease

## 2020-01-31 DIAGNOSIS — E7849 Other hyperlipidemia: Secondary | ICD-10-CM

## 2020-01-31 DIAGNOSIS — R739 Hyperglycemia, unspecified: Secondary | ICD-10-CM

## 2020-01-31 DIAGNOSIS — I10 Essential (primary) hypertension: Secondary | ICD-10-CM

## 2020-01-31 NOTE — Telephone Encounter (Signed)
[name] called to schedule her 1 year f/u in January and the lab request have expired. Please put in new request and call the pt to schedule prior to appt.

## 2020-01-31 NOTE — Telephone Encounter (Signed)
Pt c/o medication issue:  1. Name of Medication: potassium chloride SA (KLOR-CON) 20 MEQ tablet triamterene-hydrochlorothiazide (MAXZIDE) 75-50 MG tablet 2. How are you currently taking this medication (dosage and times per day)? Taking 1.5 tablet per day in the morning of Maxzide. Not currently taking potassium.  3. Are you having a reaction (difficulty breathing--STAT)? No  4. What is your medication issue? Hend is calling stating she was reading over the new prescription of the potasium and it advised to let your Cardiologist know if you are taking any of the 4 listed medications before starting prescription. Junnie states on the list of the four medications Maxzide was listed. She is requesting Sharyn Lull call her back in regards to this. Please advise.

## 2020-01-31 NOTE — Telephone Encounter (Signed)
Pt would like to schedule her yearly labs (in December) for Dr. Acie Fredrickson.  She is aware I will send to him for advisement of which yearly lab work he would like prior to her January follow up. Aware that it is most likely lipid/liver but will confirm before placing orders.  She asks if he will check her A1C also. Aware we will let her know once reviewed/advised by MD.

## 2020-01-31 NOTE — Telephone Encounter (Signed)
Pt wants to make sure Dr. Acie Fredrickson aware she is taking Maxide (which she is sure he does) and taking Potassium.  She read that she should ensure her doctor knows she is on Maxide and ok for her to take K+. She also is going to take K+ at night instead of morning and also wants to make sure this is ok. Aware will forward to Dr. Acie Fredrickson for approval to take Potassium (she has not started the new Rx sent in by [facility] this week) and ok to switch to taking it at night. Pt understands we will call once reviewed/advised by Dr. Acie Fredrickson.

## 2020-02-01 NOTE — Telephone Encounter (Signed)
Attempted to contact pt.  Phone rang several times with no answer and no VM. Will try again later.

## 2020-02-01 NOTE — Telephone Encounter (Signed)
We will check BMP, liver enz, lipids It would be most appropriate for her primary MD to check and follow her HBA1C. We can check it for her convenience but her primary MD will need to be the doctor who manages her DM.

## 2020-02-01 NOTE — Telephone Encounter (Signed)
Called pt.  No answer and no VM.  Will try again later.

## 2020-02-01 NOTE — Telephone Encounter (Signed)
Patient returning call.

## 2020-02-01 NOTE — Telephone Encounter (Signed)
The Maxzide and potassium supplementation  are appropriate for her. Her potassium levels are typically very low. We will consider changing her Maxzide to Spironolactone at her next office visit Cont same meds for now

## 2020-02-01 NOTE — Telephone Encounter (Signed)
Spoke with pt and made her aware of recommendations from Dr. Acie Fredrickson.  Pt verbalized understanding and was in agreement with plan.

## 2020-02-01 NOTE — Telephone Encounter (Signed)
Spoke with pt and made her aware of recommendations from Dr. Acie Fredrickson.  Pt will come for labs on 06/06/20.  Pt aware that if A1C is abnormal, PCP will need to address.

## 2020-02-16 ENCOUNTER — Ambulatory Visit (INDEPENDENT_AMBULATORY_CARE_PROVIDER_SITE_OTHER): Payer: Medicare PPO | Admitting: Vascular Surgery

## 2020-02-16 ENCOUNTER — Encounter: Payer: Self-pay | Admitting: Vascular Surgery

## 2020-02-16 ENCOUNTER — Ambulatory Visit (HOSPITAL_COMMUNITY)
Admission: RE | Admit: 2020-02-16 | Discharge: 2020-02-16 | Disposition: A | Discharge status: Home / Self Care | Payer: Medicare PPO | Source: Ambulatory Visit | Attending: Vascular Surgery | Admitting: Vascular Surgery

## 2020-02-16 ENCOUNTER — Ambulatory Visit (INDEPENDENT_AMBULATORY_CARE_PROVIDER_SITE_OTHER)
Admission: RE | Admit: 2020-02-16 | Discharge: 2020-02-16 | Disposition: A | Discharge status: Home / Self Care | Payer: Medicare PPO | Source: Ambulatory Visit | Attending: Vascular Surgery | Admitting: Vascular Surgery

## 2020-02-16 ENCOUNTER — Other Ambulatory Visit: Payer: Self-pay

## 2020-02-16 ENCOUNTER — Other Ambulatory Visit (HOSPITAL_COMMUNITY)
Admission: RE | Admit: 2020-02-16 | Discharge: 2020-02-16 | Disposition: A | Discharge status: Home / Self Care | Payer: Medicare PPO | Source: Ambulatory Visit | Attending: Vascular Surgery | Admitting: Vascular Surgery

## 2020-02-16 ENCOUNTER — Other Ambulatory Visit: Payer: Medicare PPO | Admitting: *Deleted

## 2020-02-16 VITALS — BP 114/77 | HR 88 | Temp 97.4°F | Resp 20 | Ht 67.0 in | Wt 284.0 lb

## 2020-02-16 DIAGNOSIS — I824Y9 Acute embolism and thrombosis of unspecified deep veins of unspecified proximal lower extremity: Secondary | ICD-10-CM

## 2020-02-16 DIAGNOSIS — Z01812 Encounter for preprocedural laboratory examination: Secondary | ICD-10-CM | POA: Insufficient documentation

## 2020-02-16 DIAGNOSIS — I10 Essential (primary) hypertension: Secondary | ICD-10-CM

## 2020-02-16 DIAGNOSIS — S85001D Unspecified injury of popliteal artery, right leg, subsequent encounter: Secondary | ICD-10-CM

## 2020-02-16 DIAGNOSIS — I872 Venous insufficiency (chronic) (peripheral): Secondary | ICD-10-CM | POA: Diagnosis present

## 2020-02-16 DIAGNOSIS — Z20822 Contact with and (suspected) exposure to covid-19: Secondary | ICD-10-CM | POA: Insufficient documentation

## 2020-02-16 DIAGNOSIS — E782 Mixed hyperlipidemia: Secondary | ICD-10-CM

## 2020-02-16 LAB — BASIC METABOLIC PANEL
BUN/Creatinine Ratio: 24 (ref 12–28)
BUN: 20 mg/dL (ref 8–27)
CO2: 27 mmol/L (ref 20–29)
Calcium: 9.3 mg/dL (ref 8.7–10.3)
Chloride: 102 mmol/L (ref 96–106)
Creatinine, Ser: 0.83 mg/dL (ref 0.57–1.00)
GFR calc Af Amer: 81 mL/min/{1.73_m2} (ref >59)
GFR calc non Af Amer: 70 mL/min/{1.73_m2} (ref >59)
Glucose: 95 mg/dL (ref 65–99)
Potassium: 3.8 mmol/L (ref 3.5–5.2)
Sodium: 142 mmol/L (ref 134–144)

## 2020-02-16 LAB — SARS CORONAVIRUS 2 (TAT 6-24 HRS): SARS Coronavirus 2: NEGATIVE

## 2020-02-16 NOTE — Progress Notes (Signed)
Patient ID: [name], female   DOB: [birth year], 74 y.o.   MRN: [id number]  Reason for Consult: Follow-up   Referred by [name], *  Subjective:     HPI:  [name] is a 74 y.o. female history of right above-knee to below-knee popliteal artery bypass with vein for acute right lower extremity ischemia with knee dislocation.  At the time she was on Eliquis.  She remains on Eliquis for paroxysmal atrial fibrillation.  Recently was evaluated for peroneal vein DVT.  She is religiously wearing compression stockings.  She is back taking baby aspirin daily which she had stopped.  She has no limitations to her walking other than mild leg swelling on the right.  Past Medical History:  Diagnosis Date  . Abnormal Pap smear    Rare ASCUS-H  . Arthritis   . Atrial fibrillation ([place])   . Cancer (HCC)    melanoma rt leg  . Chest pain   . Diabetes mellitus without complication ([place])   . Diverticulitis   . HTN (hypertension) 07/02/2015  . Hypercholesteremia   . Hyperglycemia 02/04/2011  . Hyperlipidemia 02/04/2011  . Hypertension    per pt no htn but needs meds for other reason  . Hypokalemia 05/17/2017  . Injury of right popliteal artery 05/17/2017  . Knee dislocation 05/17/2017  . Leg edema 02/04/2011  . Leg swelling 02/02/2011  . Leukocytosis 05/17/2017  . Obesity   . PAF (paroxysmal atrial fibrillation) ([place]) 02/02/2011  . Perforated sigmoid colon ([place])    from diverticulitis  . Popliteal artery injury 05/17/2017   Family History  Problem Relation Age of Onset  . Emphysema Father   . Atrial fibrillation Mother        has pacemaker  . Diabetes Mother   . Hypertension Mother   . Other Brother        bulbar palsy   Past Surgical History:  Procedure Laterality Date  . BYPASS GRAFT POPLITEAL TO POPLITEAL Right 05/17/2017   Procedure: BYPASS GRAFT ABOVE KNEE POPLITEAL TO BELOW KNEE POPLITEAL USING NONREVERSED RIGHT GREATER SAPHENOUS VEIN;  Surgeon: Waynetta Sandy, MD;  Location: [place];  Service: Vascular;  Laterality: Right;  . CARDIAC CATHETERIZATION  07/11/2010   Est. EF of 60-65% -- Smooth and normal coronary arteries  -- Normal LV systolic function   . CATARACT EXTRACTION, BILATERAL     08-12-17, 08-26-17  . CHOLECYSTECTOMY    . CYSTOCELE REPAIR  07/22/2010   Vault suspension, cystocele repair, graft and cystoscopy  . FINGER SURGERY    . MELANOMA EXCISION     right leg 3/12  . NASAL SINUS SURGERY    . NEUROMA SURGERY     rt foot  . TONSILLECTOMY    . TOTAL KNEE ARTHROPLASTY  06/20/2009   left knee  . TOTAL KNEE ARTHROPLASTY  12/27/2008   right knee  . TOTAL VAGINAL HYSTERECTOMY  07/22/2010   with bilateral salpingo-oophorectomy by Dr. Joan Flores  . VEIN HARVEST Right 05/17/2017   Procedure: RIGHT GREATER SAPHENOUS VEIN HARVEST;  Surgeon: Waynetta Sandy, MD;  Location: [place];  Service: Vascular;  Laterality: Right;    Short Social History:  Social History   Tobacco Use  . Smoking status: Never Smoker  . Smokeless tobacco: Never Used  Substance Use Topics  . Alcohol use: No    Allergies  Allergen Reactions  . Sulfa Antibiotics Other (See Comments)    Turned eyes yellow   . Sulfonamide Derivatives Rash  Rash  . Atenolol Cough  . Erythromycin     Nausea  . Erythromycin Base Nausea Only  . Lactose Intolerance (Gi) Diarrhea    Just MILK   . Plavix [Clopidogrel Bisulfate] Cough    cough    Current Outpatient Medications  Medication Sig Dispense Refill  . aspirin EC 81 MG tablet Take 81 mg by mouth See admin instructions. [name], thur, sat    . Calcium Carbonate-Vit D-Min (GNP CALCIUM PLUS 600 +D PO) Take 1 tablet by mouth every other day.     . diltiazem (CARDIZEM CD) 240 MG 24 hr capsule TAKE 1 CAPSULE BY MOUTH DAILY. 90 capsule 3  . ELIQUIS 5 MG TABS tablet TAKE 1 TABLET BY MOUTH 2 TIMES DAILY. 60 tablet 6  . Flaxseed, Linseed, (FLAX SEED OIL) 1000 MG CAPS Take by mouth.    . loratadine  (CLARITIN) 10 MG tablet Take 10 mg by mouth daily.    . mometasone (NASONEX) 50 MCG/ACT nasal spray Place 1 spray into the nose daily as needed for congestion.  0  . multivitamin (THERAGRAN) per tablet Take 1 tablet by mouth daily.      . Omega-3 Fatty Acids (FISH OIL) 1000 MG CAPS Take by mouth.    [name] omeprazole (PRILOSEC) 20 MG capsule     . PATADAY 0.2 % SOLN Place 1 drop into both eyes daily.   4  . potassium chloride SA (KLOR-CON) 20 MEQ tablet Take 0.5 tablets (10 mEq total) by mouth daily. 45 tablet 3  . propranolol (INDERAL) 10 MG tablet Take 1 tablet (10 mg total) by mouth 4 (four) times daily as needed (palpitations or fast heart rate). 60 tablet 6  . psyllium (METAMUCIL) 58.6 % packet Take 1 packet by mouth daily.    [name] triamterene-hydrochlorothiazide (MAXZIDE) 75-50 MG tablet Take 1.5 tablets by mouth daily.     . potassium chloride (KLOR-CON) 10 MEQ tablet TAKE 1 TABLET BY MOUTH DAILY. (Patient not taking: Reported on 02/16/2020) 90 tablet 2   No current facility-administered medications for this visit.    Review of Systems  Constitutional:  Constitutional negative. HENT: HENT negative.  Eyes: Eyes negative.  Respiratory: Respiratory negative.  Cardiovascular: Positive for leg swelling.  GI: Gastrointestinal negative.  Musculoskeletal: Musculoskeletal negative.  Skin: Skin negative.  Neurological: Neurological negative. Hematologic: Hematologic/lymphatic negative.  Psychiatric: Psychiatric negative.        Objective:  Objective   Vitals:   02/16/20 1044  BP: 114/77  Pulse: 88  Resp: 20  Temp: (!) 97.4 F (36.3 C)    Physical Exam Constitutional:      Appearance: She is obese.  HENT:     Head: Normocephalic.     Nose:     Comments: Wearing a mask Eyes:     Pupils: Pupils are equal, round, and reactive to light.  Cardiovascular:     Pulses:          Popliteal pulses are 2+ on the right side and 2+ on the left side.  Pulmonary:     Effort: Pulmonary  effort is normal.  Abdominal:     General: Abdomen is flat.     Palpations: Abdomen is soft.  Musculoskeletal:        General: Normal range of motion.  Skin:    General: Skin is warm and dry.     Capillary Refill: Capillary refill takes less than 2 seconds.  Neurological:     General: No focal deficit present.  Mental Status: She is alert.  Psychiatric:        Mood and Affect: Mood normal.        Behavior: Behavior normal.        Thought Content: Thought content normal.        Judgment: Judgment normal.     Data: I have independently interpreted her femoropopliteal bypass duplex which demonstrates triphasic and biphasic waveforms.  Velocities down to 14 cm/s in the proximal graft.  ABIs are greater than 1 bilaterally     Assessment/Plan:    74 year old female status post right above-knee popliteal to below-knee popliteal artery bypass with ipsilateral vein after falling on ice.  At the time she was on Eliquis she remains on Eliquis for paroxysmal atrial fibrillation.  Given her decreased velocities in her graft this is concerning for impending graft failure.  I have recommended angiography from the left common femoral approach.  We discussed the risk and benefits she will hold Eliquis 48 hours prior and restart the next day.  This can be done as an outpatient to prevent the graft from failing.  We will get her scheduled for Monday in the near future.     Waynetta Sandy MD [facility]

## 2020-02-16 NOTE — H&P (View-Only) (Signed)
Patient ID: [name], female   DOB: Oct 17, 1945, 74 y.o.   MRN: [id number]  Reason for Consult: Follow-up   Referred by [name], *  Subjective:     HPI:  Rheannon Anthony is a 74 y.o. female history of right above-knee to below-knee popliteal artery bypass with vein for acute right lower extremity ischemia with knee dislocation.  At the time she was on Eliquis.  She remains on Eliquis for paroxysmal atrial fibrillation.  Recently was evaluated for peroneal vein DVT.  She is religiously wearing compression stockings.  She is back taking baby aspirin daily which she had stopped.  She has no limitations to her walking other than mild leg swelling on the right.  Past Medical History:  Diagnosis Date  . Abnormal Pap smear    Rare ASCUS-H  . Arthritis   . Atrial fibrillation ([name])   . Cancer (HCC)    melanoma rt leg  . Chest pain   . Diabetes mellitus without complication ([name])   . Diverticulitis   . HTN (hypertension) 07/02/2015  . Hypercholesteremia   . Hyperglycemia 02/04/2011  . Hyperlipidemia 02/04/2011  . Hypertension    per pt no htn but needs meds for other reason  . Hypokalemia 05/17/2017  . Injury of right popliteal artery 05/17/2017  . Knee dislocation 05/17/2017  . Leg edema 02/04/2011  . Leg swelling 02/02/2011  . Leukocytosis 05/17/2017  . Obesity   . PAF (paroxysmal atrial fibrillation) ([name]) 02/02/2011  . Perforated sigmoid colon ([name])    from diverticulitis  . Popliteal artery injury 05/17/2017   Family History  Problem Relation Age of Onset  . Emphysema Father   . Atrial fibrillation Mother        has pacemaker  . Diabetes Mother   . Hypertension Mother   . Other Brother        bulbar palsy   Past Surgical History:  Procedure Laterality Date  . BYPASS GRAFT POPLITEAL TO POPLITEAL Right 05/17/2017   Procedure: BYPASS GRAFT ABOVE KNEE POPLITEAL TO BELOW KNEE POPLITEAL USING NONREVERSED RIGHT GREATER SAPHENOUS VEIN;  Surgeon: Waynetta Sandy, MD;  Location: [place];  Service: Vascular;  Laterality: Right;  . CARDIAC CATHETERIZATION  07/11/2010   Est. EF of 60-65% -- Smooth and normal coronary arteries  -- Normal LV systolic function   . CATARACT EXTRACTION, BILATERAL     08-12-17, 08-26-17  . CHOLECYSTECTOMY    . CYSTOCELE REPAIR  07/22/2010   Vault suspension, cystocele repair, graft and cystoscopy  . FINGER SURGERY    . MELANOMA EXCISION     right leg 3/12  . NASAL SINUS SURGERY    . NEUROMA SURGERY     rt foot  . TONSILLECTOMY    . TOTAL KNEE ARTHROPLASTY  06/20/2009   left knee  . TOTAL KNEE ARTHROPLASTY  12/27/2008   right knee  . TOTAL VAGINAL HYSTERECTOMY  07/22/2010   with bilateral salpingo-oophorectomy by Dr. Joan Anthony  . VEIN HARVEST Right 05/17/2017   Procedure: RIGHT GREATER SAPHENOUS VEIN HARVEST;  Surgeon: Waynetta Sandy, MD;  Location: [place];  Service: Vascular;  Laterality: Right;    Short Social History:  Social History   Tobacco Use  . Smoking status: Never Smoker  . Smokeless tobacco: Never Used  Substance Use Topics  . Alcohol use: No    Allergies  Allergen Reactions  . Sulfa Antibiotics Other (See Comments)    Turned eyes yellow   . Sulfonamide Derivatives Rash  Rash  . Atenolol Cough  . Erythromycin     Nausea  . Erythromycin Base Nausea Only  . Lactose Intolerance (Gi) Diarrhea    Just MILK   . Plavix [Clopidogrel Bisulfate] Cough    cough    Current Outpatient Medications  Medication Sig Dispense Refill  . aspirin EC 81 MG tablet Take 81 mg by mouth See admin instructions. Lydia Guiles, thur, sat    . Calcium Carbonate-Vit D-Min (GNP CALCIUM PLUS 600 +D PO) Take 1 tablet by mouth every other day.     . diltiazem (CARDIZEM CD) 240 MG 24 hr capsule TAKE 1 CAPSULE BY MOUTH DAILY. 90 capsule 3  . ELIQUIS 5 MG TABS tablet TAKE 1 TABLET BY MOUTH 2 TIMES DAILY. 60 tablet 6  . Flaxseed, Linseed, (FLAX SEED OIL) 1000 MG CAPS Take by mouth.    . loratadine  (CLARITIN) 10 MG tablet Take 10 mg by mouth daily.    . mometasone (NASONEX) 50 MCG/ACT nasal spray Place 1 spray into the nose daily as needed for congestion.  0  . multivitamin (THERAGRAN) per tablet Take 1 tablet by mouth daily.      . Omega-3 Fatty Acids (FISH OIL) 1000 MG CAPS Take by mouth.    Marland Kitchen omeprazole (PRILOSEC) 20 MG capsule     . PATADAY 0.2 % SOLN Place 1 drop into both eyes daily.   4  . potassium chloride SA (KLOR-CON) 20 MEQ tablet Take 0.5 tablets (10 mEq total) by mouth daily. 45 tablet 3  . propranolol (INDERAL) 10 MG tablet Take 1 tablet (10 mg total) by mouth 4 (four) times daily as needed (palpitations or fast heart rate). 60 tablet 6  . psyllium (METAMUCIL) 58.6 % packet Take 1 packet by mouth daily.    Marland Kitchen triamterene-hydrochlorothiazide (MAXZIDE) 75-50 MG tablet Take 1.5 tablets by mouth daily.     . potassium chloride (KLOR-CON) 10 MEQ tablet TAKE 1 TABLET BY MOUTH DAILY. (Patient not taking: Reported on 02/16/2020) 90 tablet 2   No current facility-administered medications for this visit.    Review of Systems  Constitutional:  Constitutional negative. HENT: HENT negative.  Eyes: Eyes negative.  Respiratory: Respiratory negative.  Cardiovascular: Positive for leg swelling.  GI: Gastrointestinal negative.  Musculoskeletal: Musculoskeletal negative.  Skin: Skin negative.  Neurological: Neurological negative. Hematologic: Hematologic/lymphatic negative.  Psychiatric: Psychiatric negative.        Objective:  Objective   Vitals:   02/16/20 1044  BP: 114/77  Pulse: 88  Resp: 20  Temp: (!) 97.4 F (36.3 C)    Physical Exam Constitutional:      Appearance: She is obese.  HENT:     Head: Normocephalic.     Nose:     Comments: Wearing a mask Eyes:     Pupils: Pupils are equal, round, and reactive to light.  Cardiovascular:     Pulses:          Popliteal pulses are 2+ on the right side and 2+ on the left side.  Pulmonary:     Effort: Pulmonary  effort is normal.  Abdominal:     General: Abdomen is flat.     Palpations: Abdomen is soft.  Musculoskeletal:        General: Normal range of motion.  Skin:    General: Skin is warm and dry.     Capillary Refill: Capillary refill takes less than 2 seconds.  Neurological:     General: No focal deficit present.  Mental Status: She is alert.  Psychiatric:        Mood and Affect: Mood normal.        Behavior: Behavior normal.        Thought Content: Thought content normal.        Judgment: Judgment normal.     Data: I have independently interpreted her femoropopliteal bypass duplex which demonstrates triphasic and biphasic waveforms.  Velocities down to 14 cm/s in the proximal graft.  ABIs are greater than 1 bilaterally     Assessment/Plan:    74 year old female status post right above-knee popliteal to below-knee popliteal artery bypass with ipsilateral vein after falling on ice.  At the time she was on Eliquis she remains on Eliquis for paroxysmal atrial fibrillation.  Given her decreased velocities in her graft this is concerning for impending graft failure.  I have recommended angiography from the left common femoral approach.  We discussed the risk and benefits she will hold Eliquis 48 hours prior and restart the next day.  This can be done as an outpatient to prevent the graft from failing.  We will get her scheduled for Monday in the near future.     Waynetta Sandy MD [facility]

## 2020-02-19 ENCOUNTER — Other Ambulatory Visit: Payer: Self-pay

## 2020-02-19 ENCOUNTER — Ambulatory Visit (HOSPITAL_COMMUNITY)
Admission: RE | Admit: 2020-02-19 | Discharge: 2020-02-19 | Disposition: A | Discharge status: Home / Self Care | Payer: Medicare PPO | Attending: Vascular Surgery | Admitting: Vascular Surgery

## 2020-02-19 ENCOUNTER — Encounter (HOSPITAL_COMMUNITY)
Admission: RE | Disposition: A | Discharge status: Home / Self Care | Payer: Self-pay | Source: Home / Self Care | Attending: Vascular Surgery

## 2020-02-19 DIAGNOSIS — Z881 Allergy status to other antibiotic agents status: Secondary | ICD-10-CM | POA: Diagnosis not present

## 2020-02-19 DIAGNOSIS — Z96653 Presence of artificial knee joint, bilateral: Secondary | ICD-10-CM | POA: Diagnosis not present

## 2020-02-19 DIAGNOSIS — Z8249 Family history of ischemic heart disease and other diseases of the circulatory system: Secondary | ICD-10-CM | POA: Diagnosis not present

## 2020-02-19 DIAGNOSIS — E669 Obesity, unspecified: Secondary | ICD-10-CM | POA: Insufficient documentation

## 2020-02-19 DIAGNOSIS — M1990 Unspecified osteoarthritis, unspecified site: Secondary | ICD-10-CM | POA: Insufficient documentation

## 2020-02-19 DIAGNOSIS — Z6841 Body Mass Index (BMI) 40.0 and over, adult: Secondary | ICD-10-CM | POA: Insufficient documentation

## 2020-02-19 DIAGNOSIS — I480 Paroxysmal atrial fibrillation: Secondary | ICD-10-CM | POA: Insufficient documentation

## 2020-02-19 DIAGNOSIS — Z888 Allergy status to other drugs, medicaments and biological substances status: Secondary | ICD-10-CM | POA: Insufficient documentation

## 2020-02-19 DIAGNOSIS — T829XXA Unspecified complication of cardiac and vascular prosthetic device, implant and graft, initial encounter: Secondary | ICD-10-CM | POA: Insufficient documentation

## 2020-02-19 DIAGNOSIS — Z833 Family history of diabetes mellitus: Secondary | ICD-10-CM | POA: Diagnosis not present

## 2020-02-19 DIAGNOSIS — Z7982 Long term (current) use of aspirin: Secondary | ICD-10-CM | POA: Insufficient documentation

## 2020-02-19 DIAGNOSIS — E7800 Pure hypercholesterolemia, unspecified: Secondary | ICD-10-CM | POA: Insufficient documentation

## 2020-02-19 DIAGNOSIS — E119 Type 2 diabetes mellitus without complications: Secondary | ICD-10-CM | POA: Insufficient documentation

## 2020-02-19 DIAGNOSIS — E785 Hyperlipidemia, unspecified: Secondary | ICD-10-CM | POA: Diagnosis not present

## 2020-02-19 DIAGNOSIS — Z882 Allergy status to sulfonamides status: Secondary | ICD-10-CM | POA: Diagnosis not present

## 2020-02-19 DIAGNOSIS — Z7901 Long term (current) use of anticoagulants: Secondary | ICD-10-CM | POA: Insufficient documentation

## 2020-02-19 DIAGNOSIS — Y832 Surgical operation with anastomosis, bypass or graft as the cause of abnormal reaction of the patient, or of later complication, without mention of misadventure at the time of the procedure: Secondary | ICD-10-CM | POA: Insufficient documentation

## 2020-02-19 DIAGNOSIS — I10 Essential (primary) hypertension: Secondary | ICD-10-CM | POA: Insufficient documentation

## 2020-02-19 DIAGNOSIS — Z79899 Other long term (current) drug therapy: Secondary | ICD-10-CM | POA: Insufficient documentation

## 2020-02-19 DIAGNOSIS — I739 Peripheral vascular disease, unspecified: Secondary | ICD-10-CM

## 2020-02-19 HISTORY — PX: ABDOMINAL AORTOGRAM W/LOWER EXTREMITY: CATH118223

## 2020-02-19 LAB — BASIC METABOLIC PANEL
Anion gap: 10 (ref 5–15)
BUN: 19 mg/dL (ref 8–23)
CO2: 28 mmol/L (ref 22–32)
Calcium: 9.3 mg/dL (ref 8.9–10.3)
Chloride: 103 mmol/L (ref 98–111)
Creatinine, Ser: 0.85 mg/dL (ref 0.44–1.00)
GFR calc Af Amer: >60 mL/min (ref >60)
GFR calc non Af Amer: >60 mL/min (ref >60)
Glucose, Bld: 129 mg/dL — ABNORMAL HIGH (ref 70–99)
Potassium: 3.5 mmol/L (ref 3.5–5.1)
Sodium: 141 mmol/L (ref 135–145)

## 2020-02-19 LAB — POCT I-STAT, CHEM 8
BUN: 31 mg/dL — ABNORMAL HIGH (ref 8–23)
Calcium, Ion: 1.01 mmol/L — ABNORMAL LOW (ref 1.15–1.40)
Chloride: 104 mmol/L (ref 98–111)
Creatinine, Ser: 0.7 mg/dL (ref 0.44–1.00)
Glucose, Bld: 123 mg/dL — ABNORMAL HIGH (ref 70–99)
HCT: 47 % — ABNORMAL HIGH (ref 36.0–46.0)
Hemoglobin: 16 g/dL — ABNORMAL HIGH (ref 12.0–15.0)
Potassium: 6.2 mmol/L — ABNORMAL HIGH (ref 3.5–5.1)
Sodium: 137 mmol/L (ref 135–145)
TCO2: 28 mmol/L (ref 22–32)

## 2020-02-19 SURGERY — ABDOMINAL AORTOGRAM W/LOWER EXTREMITY
Anesthesia: LOCAL | Laterality: Bilateral

## 2020-02-19 MED ORDER — SODIUM CHLORIDE 0.9 % IV SOLN: INTRAVENOUS | Status: DC

## 2020-02-19 MED ORDER — IODIXANOL 320 MG/ML IV SOLN
INTRAVENOUS | Status: DC | PRN
  Administered 2020-02-19: 75 mL

## 2020-02-19 MED ORDER — MIDAZOLAM HCL 2 MG/2ML IJ SOLN
INTRAMUSCULAR | Status: DC | PRN
  Administered 2020-02-19: 1 mg via INTRAVENOUS

## 2020-02-19 MED ORDER — HEPARIN (PORCINE) IN NACL 1000-0.9 UT/500ML-% IV SOLN
INTRAVENOUS | Status: DC | PRN
  Administered 2020-02-19: 500 mL

## 2020-02-19 MED ORDER — HYDRALAZINE HCL 20 MG/ML IJ SOLN: 5.0000 mg | INTRAMUSCULAR | Status: DC | PRN

## 2020-02-19 MED ORDER — SODIUM CHLORIDE 0.9% FLUSH: 3.0000 mL | Freq: Two times a day (BID) | INTRAVENOUS | Status: DC

## 2020-02-19 MED ORDER — SODIUM CHLORIDE 0.9 % IV SOLN: 250.0000 mL | INTRAVENOUS | Status: DC | PRN

## 2020-02-19 MED ORDER — LIDOCAINE HCL (PF) 1 % IJ SOLN
INTRAMUSCULAR | Status: AC
  Filled 2020-02-19: qty 30

## 2020-02-19 MED ORDER — OXYCODONE HCL 5 MG PO TABS: 5.0000 mg | ORAL_TABLET | ORAL | Status: DC | PRN

## 2020-02-19 MED ORDER — SODIUM CHLORIDE 0.9 % WEIGHT BASED INFUSION: 1.0000 mL/kg/h | INTRAVENOUS | Status: DC

## 2020-02-19 MED ORDER — MIDAZOLAM HCL 2 MG/2ML IJ SOLN
INTRAMUSCULAR | Status: AC
  Filled 2020-02-19: qty 2

## 2020-02-19 MED ORDER — LIDOCAINE HCL (PF) 1 % IJ SOLN
INTRAMUSCULAR | Status: DC | PRN
  Administered 2020-02-19: 18 mL

## 2020-02-19 MED ORDER — FENTANYL CITRATE (PF) 100 MCG/2ML IJ SOLN
INTRAMUSCULAR | Status: AC
  Filled 2020-02-19: qty 2

## 2020-02-19 MED ORDER — ACETAMINOPHEN 325 MG PO TABS: 650.0000 mg | ORAL_TABLET | ORAL | Status: DC | PRN

## 2020-02-19 MED ORDER — MORPHINE SULFATE (PF) 2 MG/ML IV SOLN: 2.0000 mg | INTRAVENOUS | Status: DC | PRN

## 2020-02-19 MED ORDER — HEPARIN (PORCINE) IN NACL 1000-0.9 UT/500ML-% IV SOLN
INTRAVENOUS | Status: AC
  Filled 2020-02-19: qty 1000

## 2020-02-19 MED ORDER — SODIUM CHLORIDE 0.9% FLUSH: 3.0000 mL | INTRAVENOUS | Status: DC | PRN

## 2020-02-19 MED ORDER — ONDANSETRON HCL 4 MG/2ML IJ SOLN: 4.0000 mg | Freq: Four times a day (QID) | INTRAMUSCULAR | Status: DC | PRN

## 2020-02-19 MED ORDER — FENTANYL CITRATE (PF) 100 MCG/2ML IJ SOLN
INTRAMUSCULAR | Status: DC | PRN
Start: 2020-02-19 — End: 2020-02-19
  Administered 2020-02-19: 50 ug via INTRAVENOUS

## 2020-02-19 SURGICAL SUPPLY — 11 items
CATH OMNI FLUSH 5F 65CM (CATHETERS) ×1 IMPLANT
CLOSURE MYNX CONTROL 5F (Vascular Products) ×1 IMPLANT
KIT MICROPUNCTURE NIT STIFF (SHEATH) ×1 IMPLANT
KIT PV (KITS) ×2 IMPLANT
MAT PREVALON FULL STRYKER (MISCELLANEOUS) ×1 IMPLANT
SHEATH PINNACLE 5F 10CM (SHEATH) ×1 IMPLANT
SHEATH PROBE COVER 6X72 (BAG) ×1 IMPLANT
SYR MEDRAD MARK V 150ML (SYRINGE) ×1 IMPLANT
TRANSDUCER W/STOPCOCK (MISCELLANEOUS) ×2 IMPLANT
TRAY PV CATH (CUSTOM PROCEDURE TRAY) ×2 IMPLANT
WIRE BENTSON .035X145CM (WIRE) ×1 IMPLANT

## 2020-02-19 NOTE — Interval H&P Note (Signed)
History and Physical Interval Note:  02/19/2020 9:02 AM  [name]  has presented today for surgery, with the diagnosis of graft stenosis.  The various methods of treatment have been discussed with the patient and family. After consideration of risks, benefits and other options for treatment, the patient has consented to  Procedure(s): ABDOMINAL AORTOGRAM W/LOWER EXTREMITY (Bilateral) as a surgical intervention.  The patient's history has been reviewed, patient examined, no change in status, stable for surgery.  I have reviewed the patient's chart and labs.  Questions were answered to the patient's satisfaction.     Servando Snare

## 2020-02-19 NOTE — Discharge Instructions (Signed)
DRINK PLENTY OF FLUIDS OVER THE NEXT 2-3 DAYS.Femoral Site Care This sheet gives you information about how to care for yourself after your procedure. Your health care provider may also give you more specific instructions. If you have problems or questions, contact your health care provider. What can I expect after the procedure? After the procedure, it is common to have:  Bruising that usually fades within 1-2 weeks.  Tenderness at the site. Follow these instructions at home: Wound care  Follow instructions from your health care provider about how to take care of your insertion site. Make sure you: ? Wash your hands with soap and water before you change your bandage (dressing). If soap and water are not available, use hand sanitizer. ? Change your dressing as told by your health care provider. ? Leave stitches (sutures), skin glue, or adhesive strips in place. These skin closures may need to stay in place for 2 weeks or longer. If adhesive strip edges start to loosen and curl up, you may trim the loose edges. Do not remove adhesive strips completely unless your health care provider tells you to do that.  Do not take baths, swim, or use a hot tub until your health care provider approves.  You may shower 24-48 hours after the procedure or as told by your health care provider. ? Gently wash the site with plain soap and water. ? Pat the area dry with a clean towel. ? Do not rub the site. This may cause bleeding.  Do not apply powder or lotion to the site. Keep the site clean and dry.  Check your femoral site every day for signs of infection. Check for: ? Redness, swelling, or pain. ? Fluid or blood. ? Warmth. ? Pus or a bad smell. Activity  For the first 2-3 days after your procedure, or as long as directed: ? Avoid climbing stairs as much as possible. ? Do not squat.  Do not lift anything that is heavier than 10 lb (4.5 kg), or the limit that you are told, until your health care  provider says that it is safe.  Rest as directed. ? Avoid sitting for a long time without moving. Get up to take short walks every 1-2 hours.  Do not drive for 24 hours if you were given a medicine to help you relax (sedative). General instructions  Take over-the-counter and prescription medicines only as told by your health care provider.  Keep all follow-up visits as told by your health care provider. This is important. Contact a health care provider if you have:  A fever or chills.  You have redness, swelling, or pain around your insertion site. Get help right away if:  The catheter insertion area swells very fast.  You pass out.  You suddenly start to sweat or your skin gets clammy.  The catheter insertion area is bleeding, and the bleeding does not stop when you hold steady pressure on the area.  The area near or just beyond the catheter insertion site becomes pale, cool, tingly, or numb. These symptoms may represent a serious problem that is an emergency. Do not wait to see if the symptoms will go away. Get medical help right away. Call your local emergency services (911 in the [place] Do not drive yourself to the hospital. Summary  After the procedure, it is common to have bruising that usually fades within 1-2 weeks.  Check your femoral site every day for signs of infection.  Do not lift anything that  is heavier than 10 lb (4.5 kg), or the limit that you are told, until your health care provider says that it is safe. This information is not intended to replace advice given to you by your health care provider. Make sure you discuss any questions you have with your health care provider. Document Revised: 06/07/2017 Document Reviewed: 06/07/2017 Elsevier Patient Education  2020 [employer].

## 2020-02-19 NOTE — Interval H&P Note (Signed)
History and Physical Interval Note:  02/19/2020 10:22 AM  [name]  has presented today for surgery, with the diagnosis of graft stenosis.  The various methods of treatment have been discussed with the patient and family. After consideration of risks, benefits and other options for treatment, the patient has consented to  Procedure(s): ABDOMINAL AORTOGRAM W/LOWER EXTREMITY (Bilateral) as a surgical intervention.  The patient's history has been reviewed, patient examined, no change in status, stable for surgery.  I have reviewed the patient's chart and labs.  Questions were answered to the patient's satisfaction.     Servando Snare

## 2020-02-19 NOTE — Op Note (Signed)
    Patient name: [name] MRN: [id number] DOB: Feb 16, 1946 Sex: female  02/19/2020 Pre-operative Diagnosis: Right popliteal to popliteal bypass with concern for impending failure Post-operative diagnosis:  Same Surgeon:  Eda Paschal. Donzetta Matters, MD Procedure Performed: 1.  US guided cannulation left common femoral artery 2.  Aortogram 3.  Selection of right common femoral artery and right lower extremity angiography 4.  Minx device closure left common femoral artery 5.  Moderate sedation with fentanyl and Versed for 25 minutes  Indications: 74 year old female previously underwent right above-knee popliteal to below-knee popliteal artery bypass for acute injury with acute right lower extremity ischemia.  She now has undergone total knee replacement.  She has duplex evidence of impending graft failure she is indicated for angiography possible intervention.  Findings: Bypass is patent there does not appear to be any flow-limiting stenosis she has three-vessel runoff to the right foot.  Aorta and iliac segments are free of flow-limiting stenosis as are her native common femoral, SFA and tibial vessels.   Procedure:  The patient was identified in the holding area and taken to room 8.  The patient was then placed supine on the table and prepped and draped in the usual sterile fashion.  A time out was called.  Ultrasound was used to evaluate the left common femoral artery this was noted to be patent.  The area was massaged with 1% lidocaine and the artery was cannulated with micropuncture needle followed by wire sheath.  An image was saved to the permanent record.  A Bentson wire was placed followed by 5 [place] sheath.  An Omni catheter was placed to the level of L1 and aortogram performed.  We crossed the bifurcation with Omni catheter and Bentson wire.  We perform right lower extremity angiography with the above findings.  No intervention was undertaken catheter and wire were removed together.  A minx  device was deployed she tolerated procedure well without immediate complication.  Contrast: 75cc   [name] C. Donzetta Matters, MD [facility] Office: [phone] Pager: [phone]

## 2020-02-20 ENCOUNTER — Encounter (HOSPITAL_COMMUNITY): Payer: Self-pay | Admitting: Vascular Surgery

## 2020-02-21 ENCOUNTER — Encounter (HOSPITAL_COMMUNITY): Payer: Self-pay | Admitting: Vascular Surgery

## 2020-03-22 ENCOUNTER — Telehealth: Payer: Self-pay

## 2020-03-22 NOTE — Telephone Encounter (Signed)
Call from pt stating she's concerned that she may have a blood clot in her left lower leg and wants to know what to do especially going into the weekend. She went to PCP yesterday and was recommended to have a vascular U/S to rule out blood clot. PCP was to fax order order over to office.   She reports her left leg above the ankle having a painful burning sensation when standing, a tiny raised area, and warm to touch. She's been wearing compression stockings, elevating legs and takes Eliquis for PAF.   Pt also reported doing yard work this past week at [employer], such as mowing and pulling weeds. She reports bites and itchy spots on right leg only.   Advised pt, this could be possibly be an reaction from yard work or bug bites. Advised pt will check on U/S order, discuss with provider and contact back if any additional recommendations. Instructed to seek ER tx if develop any extreme leg pain, shortness of breath, or chest pain. Continue to elevate leg anytime you're not going to the bathroom, eating, or walking. Pt voiced understanding.

## 2020-03-23 ENCOUNTER — Other Ambulatory Visit: Payer: Self-pay

## 2020-03-23 ENCOUNTER — Ambulatory Visit
Admission: RE | Admit: 2020-03-23 | Discharge: 2020-03-23 | Disposition: A | Discharge status: Home / Self Care | Payer: Medicare PPO | Source: Ambulatory Visit | Attending: Physician Assistant | Admitting: Physician Assistant

## 2020-03-23 VITALS — BP 123/87 | HR 85 | Temp 98.3°F | Resp 18

## 2020-03-23 DIAGNOSIS — M79605 Pain in left leg: Secondary | ICD-10-CM | POA: Diagnosis not present

## 2020-03-23 DIAGNOSIS — R21 Rash and other nonspecific skin eruption: Secondary | ICD-10-CM

## 2020-03-23 HISTORY — DX: Acute embolism and thrombosis of unspecified deep veins of unspecified lower extremity: I82.409

## 2020-03-23 MED ORDER — DOXYCYCLINE HYCLATE 100 MG PO CAPS: 100.0000 mg | ORAL_CAPSULE | Freq: Two times a day (BID) | ORAL | 0 refills | Status: DC

## 2020-03-23 NOTE — ED Provider Notes (Signed)
[facility]    CSN: [id number] Arrival date & time: 03/23/20  1345      History   Chief Complaint Chief Complaint  Patient presents with  . Leg Pain    HPI [name] is a 74 y.o. female.   74 year old female with history of PAF of eliquis, DM, HTN, HLD comes in for few day history of left lower leg redness, swelling, pain. Was told to follow up with vascular for doppler, but has not been able to be scheduled. States prior to symptom onset, had traveled, but did frequent stops to walk. Also did yard work. Denies itching. Denies fever. Now with rash/erythema spreading.      Past Medical History:  Diagnosis Date  . Abnormal Pap smear    Rare ASCUS-H  . Arthritis   . Atrial fibrillation ([place])   . Cancer (HCC)    melanoma rt leg  . Chest pain   . Diabetes mellitus without complication ([place])   . Diverticulitis   . DVT (deep venous thrombosis) ([place])   . HTN (hypertension) 07/02/2015  . Hypercholesteremia   . Hyperglycemia 02/04/2011  . Hyperlipidemia 02/04/2011  . Hypertension    per pt no htn but needs meds for other reason  . Hypokalemia 05/17/2017  . Injury of right popliteal artery 05/17/2017  . Knee dislocation 05/17/2017  . Leg edema 02/04/2011  . Leg swelling 02/02/2011  . Leukocytosis 05/17/2017  . Obesity   . PAF (paroxysmal atrial fibrillation) ([place]) 02/02/2011  . Perforated sigmoid colon ([place])    from diverticulitis  . Popliteal artery injury 05/17/2017    Patient Active Problem List   Diagnosis Date Noted  . Colonic diverticular abscess 12/23/2018  . Morbid obesity with body mass index (BMI) of 40.0 or higher ([place]) 12/11/2018  . On continuous oral anticoagulation 12/11/2018  . Atrial fibrillation ([place]) 12/11/2018  . Perforated bowel ([place]) 12/10/2018  . Pain in right foot 02/23/2018  . Pain of left hip joint 08/11/2017  . Stiffness of right knee 07/28/2017  . 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 Trochanteric bursitis of left  hip 07/02/2017  . Knee dislocation 05/17/2017  . Popliteal artery injury 05/17/2017  . Hypokalemia 05/17/2017  . Leukocytosis 05/17/2017  . Injury of right popliteal artery 05/17/2017  . HTN (hypertension) 07/02/2015  . Hyperlipidemia 02/04/2011  . Hyperglycemia 02/04/2011  . Leg edema 02/04/2011  . PAF (paroxysmal atrial fibrillation) ([place]) 02/02/2011  . Leg swelling 02/02/2011    Past Surgical History:  Procedure Laterality Date  . ABDOMINAL AORTOGRAM W/LOWER EXTREMITY Bilateral 02/19/2020   Procedure: ABDOMINAL AORTOGRAM W/LOWER EXTREMITY;  Surgeon: Waynetta Sandy, MD;  Location: [facility];  Service: Cardiovascular;  Laterality: Bilateral;  Rt.  lower  leg  . ABDOMINAL HYSTERECTOMY    . BYPASS GRAFT POPLITEAL TO POPLITEAL Right 05/17/2017   Procedure: BYPASS GRAFT ABOVE KNEE POPLITEAL TO BELOW KNEE POPLITEAL USING NONREVERSED RIGHT GREATER SAPHENOUS VEIN;  Surgeon: Waynetta Sandy, MD;  Location: [facility];  Service: Vascular;  Laterality: Right;  . CARDIAC CATHETERIZATION  07/11/2010   Est. EF of 60-65% -- Smooth and normal coronary arteries  -- Normal LV systolic function   . CATARACT EXTRACTION, BILATERAL     08-12-17, 08-26-17  . CHOLECYSTECTOMY    . CYSTOCELE REPAIR  07/22/2010   Vault suspension, cystocele repair, graft and cystoscopy  . FINGER SURGERY    . MELANOMA EXCISION     right leg 3/12  . NASAL SINUS SURGERY    .  NEUROMA SURGERY     rt foot  . TONSILLECTOMY    . TOTAL KNEE ARTHROPLASTY  06/20/2009   left knee  . TOTAL KNEE ARTHROPLASTY  12/27/2008   right knee  . TOTAL VAGINAL HYSTERECTOMY  07/22/2010   with bilateral salpingo-oophorectomy by Dr. Joan Flores  . VEIN HARVEST Right 05/17/2017   Procedure: RIGHT GREATER SAPHENOUS VEIN HARVEST;  Surgeon: Waynetta Sandy, MD;  Location: [facility];  Service: Vascular;  Laterality: Right;    OB History    Gravida  2   Para  2   Term  2   Preterm      AB      Living  2     SAB       TAB      Ectopic      Multiple      Live Births  2            Home Medications    Prior to Admission medications   Medication Sig Start Date End Date Taking? Authorizing Provider  Ascorbic Acid (VITAMIN C) 1000 MG tablet Take 1,000 mg by mouth daily.   Yes [provider]  aspirin EC 81 MG tablet Take 81 mg by mouth daily.    Yes [provider]  Biotin (BIOTIN 5000) 5 MG CAPS Take 5 mg by mouth daily.   Yes [provider]  Calcium Carb-Cholecalciferol (CALCIUM + D3 PO) Take 1 tablet by mouth daily.   Yes [provider]  Cholecalciferol (VITAMIN D) 50 MCG (2000 UT) tablet Take 2,000 Units by mouth daily.   Yes [provider]  diltiazem (CARDIZEM CD) 240 MG 24 hr capsule TAKE 1 CAPSULE BY MOUTH DAILY. Patient taking differently: Take 240 mg by mouth daily.  07/10/19  Yes Nahser, Wonda Cheng, MD  ELIQUIS 5 MG TABS tablet TAKE 1 TABLET BY MOUTH 2 TIMES DAILY. Patient taking differently: Take 5 mg by mouth 2 (two) times daily.  10/20/19  Yes Nahser, Wonda Cheng, MD  Flaxseed, Linseed, (FLAXSEED OIL) 1200 MG CAPS Take 1,200 mg by mouth daily.   Yes [provider]  fluticasone (FLONASE) 50 MCG/ACT nasal spray Place 1-2 sprays into both nostrils at bedtime as needed for allergies.   Yes [provider]  loratadine (CLARITIN) 10 MG tablet Take 10 mg by mouth daily.   Yes [provider]  Multiple Vitamin (MULTIVITAMIN WITH MINERALS) TABS tablet Take 1 tablet by mouth daily.   Yes [provider]  Omega-3 Fatty Acids (FISH OIL) 1200 MG CAPS Take 1,200 mg by mouth daily.   Yes [provider]  PATADAY 0.2 % SOLN Place 1 drop into both eyes daily.  04/27/16  Yes [provider]  potassium chloride SA (KLOR-CON) 20 MEQ tablet Take 0.5 tablets (10 mEq total) by mouth daily. 01/30/20  Yes Nahser, Wonda Cheng, MD  Psyllium (METAMUCIL FIBER PO) Take 1 Dose by mouth 3 (three) times daily.   Yes [provider]  triamterene-hydrochlorothiazide (MAXZIDE) 75-50 MG tablet Take 1.5 tablets by mouth daily.    Yes [provider]  acetaminophen (TYLENOL) 500 MG tablet Take 500-1,000 mg by mouth every 6 (six) hours as needed for moderate pain or headache.    [provider]  docusate sodium (COLACE) 100 MG capsule Take 100 mg by mouth daily.    [provider]  doxycycline (VIBRAMYCIN) 100 MG capsule Take 1 capsule (100 mg total) by mouth 2 (two) times daily. 03/23/20  Tasia Catchings, Donyale Falcon V, PA-C  omeprazole (PRILOSEC) 20 MG capsule Take 20 mg by mouth daily.  07/27/19   [provider]  propranolol (INDERAL) 10 MG tablet Take 1 tablet (10 mg total) by mouth 4 (four) times daily as needed (palpitations or fast heart rate). 05/29/19   Nahser, Wonda Cheng, MD  potassium chloride (KLOR-CON) 10 MEQ tablet TAKE 1 TABLET BY MOUTH DAILY. Patient not taking: Reported on 02/16/2020 09/21/19 03/23/20  Nahser, Wonda Cheng, MD    Family History Family History  Problem Relation Age of Onset  . Emphysema Father   . Atrial fibrillation Mother        has pacemaker  . Diabetes Mother   . Hypertension Mother   . Other Brother        bulbar palsy    Social History Social History   Tobacco Use  . Smoking status: Never Smoker  . Smokeless tobacco: Never Used  Vaping Use  . Vaping Use: Never used  Substance Use Topics  . Alcohol use: No  . Drug use: No     Allergies   Sulfa antibiotics, Sulfonamide derivatives, Milk-related compounds, Atenolol, Erythromycin, and Plavix [clopidogrel bisulfate]   Review of Systems Review of Systems  Reason unable to perform ROS: See HPI as above.     Physical Exam Triage Vital Signs ED Triage Vitals  Enc Vitals Group     BP 03/23/20 1354 123/87     Pulse Rate 03/23/20 1354 85     Resp 03/23/20 1354 18     Temp 03/23/20 1354 98.3 F (36.8 C)     Temp src --      SpO2 03/23/20 1354 95 %     Weight --      Height --      Head  Circumference --      Peak Flow --      Pain Score 03/23/20 1355 1     Pain Loc --      Pain Edu? --      Excl. in [place]? --    No data found.  Updated Vital Signs BP 123/87   Pulse 85   Temp 98.3 F (36.8 C)   Resp 18   LMP 07/22/2010   SpO2 95%   Physical Exam Constitutional:      General: She is not in acute distress.    Appearance: Normal appearance. She is well-developed. She is not toxic-appearing or diaphoretic.  HENT:     Head: Normocephalic and atraumatic.  Eyes:     Conjunctiva/sclera: Conjunctivae normal.     Pupils: Pupils are equal, round, and reactive to light.  Pulmonary:     Effort: Pulmonary effort is normal. No respiratory distress.  Musculoskeletal:     Cervical back: Normal range of motion and neck supple.     Comments: See picture below. Erythema with warmth approx 11cm x 8cm with few maculopapular rash surrounding. Area tender to palpation. Full ROM of knee, ankle. No tenderness, swelling, erythema to posterior leg/calf. Pedal pulse 2+.   Skin:    General: Skin is warm and dry.  Neurological:     Mental Status: She is alert and oriented to person, place, and time.          UC Treatments / Results  Labs (all labs ordered are listed, but only abnormal results are displayed) Labs Reviewed - No data to display  EKG   Radiology No results found.  Procedures Procedures (including critical care time)  Medications Ordered  in UC Medications - No data to display  Initial Impression / Assessment and Plan / UC Course  I have reviewed the triage vital signs and the nursing notes.  Pertinent labs & imaging results that were available during my care of the patient were reviewed by me and considered in my medical decision making (see chart for details).    ?dermatitis vs cellulitis vs blood clot. However, patient already on eliquis daily, no chest pain, shob. No need for ED visit to obtain doppler at this time. Will start doxycycline given history  of MRSA to cover for cellulitis. Return precautions given. Otherwise to follow up with vascular for further evaluation needed.   Final Clinical Impressions(s) / UC Diagnoses   Final diagnoses:  Left leg pain  Rash    ED Prescriptions    Medication Sig Dispense Auth. Provider   doxycycline (VIBRAMYCIN) 100 MG capsule Take 1 capsule (100 mg total) by mouth 2 (two) times daily. 14 capsule Ok Edwards, PA-C     PDMP not reviewed this encounter.   Ok Edwards, PA-C 03/23/20 1426

## 2020-03-23 NOTE — Discharge Instructions (Signed)
Given appearance, will cover for infection with doxycycline. Elevation, warm compresses. Follow up with vascular for reevaluation if symptoms not improving. If having chest pain, shortness of breath, fever, go to the ED for further evaluation

## 2020-03-23 NOTE — ED Triage Notes (Signed)
Reports noticing tender spot to left lower leg; saw PCP - was sent referral to vascular surgeon's office for [name], but still has been unable to get in. Left lower leg now red with some small red spots.  LLE CMS intact; DP pulse 2+, all toes warm, pink with prompt cap refill.

## 2020-03-25 ENCOUNTER — Other Ambulatory Visit (HOSPITAL_COMMUNITY): Payer: Self-pay | Admitting: Family Medicine

## 2020-03-25 ENCOUNTER — Other Ambulatory Visit: Payer: Self-pay

## 2020-03-25 ENCOUNTER — Ambulatory Visit (HOSPITAL_COMMUNITY)
Admission: RE | Admit: 2020-03-25 | Discharge: 2020-03-25 | Disposition: A | Discharge status: Home / Self Care | Payer: Medicare PPO | Source: Ambulatory Visit | Attending: Family Medicine | Admitting: Family Medicine

## 2020-03-25 DIAGNOSIS — M79605 Pain in left leg: Secondary | ICD-10-CM | POA: Diagnosis not present

## 2020-04-10 DIAGNOSIS — M19079 Primary osteoarthritis, unspecified ankle and foot: Secondary | ICD-10-CM | POA: Insufficient documentation

## 2020-04-10 DIAGNOSIS — M79672 Pain in left foot: Secondary | ICD-10-CM | POA: Insufficient documentation

## 2020-05-23 ENCOUNTER — Other Ambulatory Visit: Payer: Self-pay | Admitting: Cardiovascular Disease

## 2020-05-23 NOTE — Telephone Encounter (Signed)
Eliquis 5mg  refill request received. Patient is 74 years old, weight-127kg, Crea-0.85 on 02/19/2020, Diagnosis-Afib, and last seen by Dr. Acie Fredrickson on 06/12/2019 & pending appt on 06/10/2020. Dose is appropriate based on dosing criteria. Will send in refill to requested pharmacy.

## 2020-06-06 ENCOUNTER — Other Ambulatory Visit: Payer: Self-pay

## 2020-06-06 ENCOUNTER — Other Ambulatory Visit: Payer: Medicare PPO | Admitting: *Deleted

## 2020-06-06 DIAGNOSIS — I10 Essential (primary) hypertension: Secondary | ICD-10-CM

## 2020-06-06 DIAGNOSIS — E7849 Other hyperlipidemia: Secondary | ICD-10-CM

## 2020-06-06 DIAGNOSIS — R739 Hyperglycemia, unspecified: Secondary | ICD-10-CM

## 2020-06-07 LAB — HEMOGLOBIN A1C
Est. average glucose Bld gHb Est-mCnc: 131 mg/dL
Hgb A1c MFr Bld: 6.2 % — ABNORMAL HIGH (ref 4.8–5.6)

## 2020-06-07 LAB — BASIC METABOLIC PANEL
BUN/Creatinine Ratio: 26 (ref 12–28)
BUN: 20 mg/dL (ref 8–27)
CO2: 26 mmol/L (ref 20–29)
Calcium: 9.5 mg/dL (ref 8.7–10.3)
Chloride: 100 mmol/L (ref 96–106)
Creatinine, Ser: 0.77 mg/dL (ref 0.57–1.00)
GFR calc Af Amer: 89 mL/min/{1.73_m2} (ref >59)
GFR calc non Af Amer: 77 mL/min/{1.73_m2} (ref >59)
Glucose: 119 mg/dL — ABNORMAL HIGH (ref 65–99)
Potassium: 3.9 mmol/L (ref 3.5–5.2)
Sodium: 141 mmol/L (ref 134–144)

## 2020-06-07 LAB — LIPID PANEL
Chol/HDL Ratio: 3.9 ratio (ref 0.0–4.4)
Cholesterol, Total: 181 mg/dL (ref 100–199)
HDL: 47 mg/dL (ref >39)
LDL Chol Calc (NIH): 113 mg/dL — ABNORMAL HIGH (ref 0–99)
Triglycerides: 119 mg/dL (ref 0–149)
VLDL Cholesterol Cal: 21 mg/dL (ref 5–40)

## 2020-06-07 LAB — HEPATIC FUNCTION PANEL
ALT: 19 IU/L (ref 0–32)
AST: 16 IU/L (ref 0–40)
Albumin: 4.1 g/dL (ref 3.7–4.7)
Alkaline Phosphatase: 85 IU/L (ref 44–121)
Bilirubin Total: 0.5 mg/dL (ref 0.0–1.2)
Bilirubin, Direct: 0.15 mg/dL (ref 0.00–0.40)
Total Protein: 6.2 g/dL (ref 6.0–8.5)

## 2020-06-09 ENCOUNTER — Encounter: Payer: Self-pay | Admitting: Cardiovascular Disease

## 2020-06-09 NOTE — Progress Notes (Signed)
° ° °  This encounter was created in error - please disregard.

## 2020-06-10 ENCOUNTER — Ambulatory Visit: Payer: Medicare PPO | Admitting: Cardiovascular Disease

## 2020-06-10 ENCOUNTER — Encounter: Payer: Medicare PPO | Admitting: Cardiovascular Disease

## 2020-06-19 ENCOUNTER — Other Ambulatory Visit: Payer: Self-pay | Admitting: Family Medicine

## 2020-06-19 DIAGNOSIS — Z1231 Encounter for screening mammogram for malignant neoplasm of breast: Secondary | ICD-10-CM

## 2020-07-01 ENCOUNTER — Other Ambulatory Visit: Payer: Self-pay | Admitting: Nurse Practitioner

## 2020-07-01 ENCOUNTER — Ambulatory Visit
Admission: RE | Admit: 2020-07-01 | Discharge: 2020-07-01 | Disposition: A | Discharge status: Home / Self Care | Payer: Medicare PPO | Source: Ambulatory Visit | Attending: Nurse Practitioner | Admitting: Nurse Practitioner

## 2020-07-01 DIAGNOSIS — R52 Pain, unspecified: Secondary | ICD-10-CM

## 2020-07-01 DIAGNOSIS — W19XXXA Unspecified fall, initial encounter: Secondary | ICD-10-CM

## 2020-07-02 DIAGNOSIS — S62619A Displaced fracture of proximal phalanx of unspecified finger, initial encounter for closed fracture: Secondary | ICD-10-CM | POA: Insufficient documentation

## 2020-07-05 ENCOUNTER — Other Ambulatory Visit: Payer: Self-pay | Admitting: Cardiovascular Disease

## 2020-07-05 ENCOUNTER — Telehealth: Payer: Self-pay

## 2020-07-05 NOTE — Telephone Encounter (Signed)
Pt called with concerns about her R leg graft after having a fall on her dog a week or so ago. Pt c/o leg soreness and some swelling/bruising from the R knee and up. It is improving slowly. She is on Eliquis and baby Aspirin. She is going to call us back if she feels it is not improving or anything changes. She is aware to call us or report to ED if she experiences any swelling, redness, warmth, pain.

## 2020-07-08 NOTE — Progress Notes (Signed)
Cardiology Office Note   Date:  07/09/2020   ID:  [name], [name] [birth year], MRN [id number]  PCP:  Leonard Downing, MD  Cardiologist:   Mertie Moores, MD   Chief Complaint  Patient presents with  . Hypertension  . Atrial Fibrillation  . Hyperlipidemia       [name] is a 75 y.o. female who presents for follow up of her paroxysmal atrial fib  1. Hypertension 2. History of atrial fibrillation 3. Hyperlipidemia 4.  Knee replacement 5.  melanoma       [name] is doing well. She has decreased her dose of Maxzide and her BP has been elevated. She has not been exercising on a regular basis but has been trying to exercise more since New's Years Day. She has lost 6 pounds. She has not had any chest pain or dyspnea.   She did feel a little bit of extra salt recently.   January 02, 2013:  [name] has done well. She had some various aches and pains (chest , back) in Feb. The pains improved with omeprazol but it caused excessive gas and bloating. Her lipids have been minimally elevated.   07/05/2013:  [name] is doing well. She has a sinus infection recently.   January 01, 2014:  Heart is doing well. She has had some sinus infections.   Jan. 25, 2016:  [name] is doing well. No cardiac issues.  Has had a few palpitations - heart racing.  But her HR would be normal if she takes her pulse.  Rides her stationary bike 30 mninutes a day.   Jan. 24, 2017:  [name] is seen for follow up of her atrial fib.  CHADS2VASC = 3 ( female, HTN, AGE > 59)  She has PAF - was in NSR at her last visit. Is back in A-Fib today .  Asymptomatic.  No CP or dyspnea  Has occasional right sided chest pain .   Has right shoulder pain and is seeing Dr. Onnie Graham soon Had a Myoivew in 2012 - ( inf. Defect) .  Subsequent cath showed normal coronary arteries.   December 19, 2015: [name] is back in NSR today  Has PAF , has never needed cardioversion. Takes propranolol as  needed. Has some dizziness / orthostasis  Some of her dizziness is not related to orthostasis   Jan. 29, 2018:  [name] is seen today .    Has been having problems with her hemmorhoids.   Asked about watchman  I have suggested that she get her hemorrhoids fixed and we resume anticoagulation shortly thereafter.  She has occasional episodes of brief presyncope. Typically occur if she sitting down. Has never had complete syncope   Sept. 7 , 2018:  Has had several episodes of lightneadedness. Typically when she just stands up.   Also can have lightheadedness when she turns her head Has not had true syncope Has run out of potassium 2 weeks ago   September 08, 2017:  [name] is seen today  Dislocated her Right knee,   Required vascular surgery ( popliteal artery rupture) Was in rehab for a month Developed MSRS Was then found to have melanoma  Bilateral cataract removal.   Needs to have left hip replacement but they will not do it until she has lost about 25 lbs.   March 02, 2018:  [name] is seen today has been having issues with abdominal cramping. Had chills and sweats Sunday night.     Wt. Is 285. 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 4, 2021  [name] is seen today for folow up of her PAF , HTN , obesity 3-4 weeks ago. Had some lightheadedness,  Difficulty taking a deep breath.  Had some mild CP .   Wt is 281 lbs  Is still having intermittant CP  She had labs at her primary MD.  CBC was unremarkable.  Basic metabolic profile was normal.  Creatinine 0.82.  Glucose is 84.  Sodium is 142.  Potassium is 3.6.  Liver enzymes are normal.  Total cholesterol is 166.  Triglyceride levels 103.  HDL is 46.  LDL is 101.  TSH is 1.85.  Hemoglobin A1c is 6.0.  Troponin I is less than 0.01.  Vitamin B-12 level is 687 which is normal.  Has had some PAF .  Is in NSR today  Takes propranolol as needed  for   Feb. 1, 2022: [name] is seen today for follow up of her HTN, PAF, obesity [place] recently and cut her prox joint finger  / compound fracture  Has gained some weight . Lots of swelling in right leg.   On eliquis.    Is in NSR today  She had a DVT last year Has been riding stationary bike.   No CP   Past Medical History:  Diagnosis Date  . Abnormal Pap smear    Rare ASCUS-H  . Arthritis   . Atrial fibrillation ([place])   . Cancer (HCC)    melanoma rt leg  . Chest pain   . Diabetes mellitus without complication ([place])   . Diverticulitis   . DVT (deep venous thrombosis) ([place])   . HTN (hypertension) 07/02/2015  . Hypercholesteremia   . Hyperglycemia 02/04/2011  . Hyperlipidemia 02/04/2011  . Hypertension    per pt no htn but needs meds for other reason  . Hypokalemia 05/17/2017  . Injury of right popliteal artery 05/17/2017  . Knee dislocation 05/17/2017  . Leg edema 02/04/2011  . Leg swelling 02/02/2011  . Leukocytosis 05/17/2017  . Obesity   . PAF (paroxysmal atrial fibrillation) ([place]) 02/02/2011  . Perforated sigmoid colon ([place])    from diverticulitis  . Popliteal artery injury 05/17/2017    Past Surgical History:  Procedure Laterality Date  . ABDOMINAL AORTOGRAM W/LOWER EXTREMITY Bilateral 02/19/2020   Procedure: ABDOMINAL AORTOGRAM W/LOWER EXTREMITY;  Surgeon: Waynetta Sandy, MD;  Location: [facility];  Service: Cardiovascular;  Laterality: Bilateral;  Rt.  lower  leg  . ABDOMINAL HYSTERECTOMY    . BYPASS GRAFT POPLITEAL TO POPLITEAL Right 05/17/2017   Procedure: BYPASS GRAFT ABOVE KNEE POPLITEAL TO BELOW KNEE POPLITEAL USING NONREVERSED RIGHT GREATER SAPHENOUS VEIN;  Surgeon: Waynetta Sandy, MD;  Location: [facility];  Service: Vascular;  Laterality: Right;  . CARDIAC CATHETERIZATION  07/11/2010   Est. EF of 60-65% -- Smooth and normal coronary arteries  -- Normal LV systolic function   . CATARACT EXTRACTION, BILATERAL     08-12-17, 08-26-17  .  CHOLECYSTECTOMY    . CYSTOCELE REPAIR  07/22/2010   Vault suspension, cystocele repair, graft and cystoscopy  . FINGER SURGERY    . MELANOMA EXCISION     right leg 3/12  . NASAL SINUS SURGERY    . NEUROMA SURGERY     rt foot  . TONSILLECTOMY    . TOTAL KNEE ARTHROPLASTY  06/20/2009  left knee  . TOTAL KNEE ARTHROPLASTY  12/27/2008   right knee  . TOTAL VAGINAL HYSTERECTOMY  07/22/2010   with bilateral salpingo-oophorectomy by Dr. Joan Flores  . VEIN HARVEST Right 05/17/2017   Procedure: RIGHT GREATER SAPHENOUS VEIN HARVEST;  Surgeon: Waynetta Sandy, MD;  Location: [facility];  Service: Vascular;  Laterality: Right;     Current Outpatient Medications  Medication Sig Dispense Refill  . acetaminophen (TYLENOL) 500 MG tablet Take 500-1,000 mg by mouth every 6 (six) hours as needed for moderate pain or headache.    Marland Kitchen amoxicillin (AMOXIL) 500 MG capsule Take 4 capsules by mouth as needed. TAKE 4 CAPSULES BY MOUTH 1 HOUR PRIOR TO APPOINTMENT    . Ascorbic Acid (VITAMIN C) 1000 MG tablet Take 1,000 mg by mouth daily.    Marland Kitchen aspirin EC 81 MG tablet Take 81 mg by mouth daily.     . Biotin 5 MG CAPS Take 5 mg by mouth daily.    . Calcium Carb-Cholecalciferol (CALCIUM + D3 PO) Take 1 tablet by mouth daily.    . Cholecalciferol (VITAMIN D) 50 MCG (2000 UT) tablet Take 2,000 Units by mouth daily.    Marland Kitchen docusate sodium (COLACE) 100 MG capsule Take 100 mg by mouth daily.    Marland Kitchen ELIQUIS 5 MG TABS tablet TAKE 1 TABLET BY MOUTH 2 TIMES DAILY. 60 tablet 5  . Flaxseed, Linseed, (FLAXSEED OIL) 1200 MG CAPS Take 1,200 mg by mouth daily.    . fluticasone (FLONASE) 50 MCG/ACT nasal spray Place 1-2 sprays into both nostrils at bedtime as needed for allergies.    Marland Kitchen loratadine (CLARITIN) 10 MG tablet Take 10 mg by mouth daily.    . Multiple Vitamin (MULTIVITAMIN WITH MINERALS) TABS tablet Take 1 tablet by mouth daily.    . Omega-3 Fatty Acids (FISH OIL) 1200 MG CAPS Take 1,200 mg by mouth daily.    Marland Kitchen  omeprazole (PRILOSEC) 20 MG capsule Take 20 mg by mouth daily.     Marland Kitchen PATADAY 0.2 % SOLN Place 1 drop into both eyes daily.   4  . potassium chloride SA (KLOR-CON) 20 MEQ tablet Take 0.5 tablets (10 mEq total) by mouth daily. 45 tablet 3  . Psyllium (METAMUCIL FIBER PO) Take 1 Dose by mouth 3 (three) times daily.    Marland Kitchen triamterene-hydrochlorothiazide (MAXZIDE) 75-50 MG tablet Take 1 tablet by mouth daily.    . cephALEXin (KEFLEX) 500 MG capsule Take 1 capsule by mouth as directed.    . diltiazem (CARDIZEM CD) 240 MG 24 hr capsule Take 1 capsule (240 mg total) by mouth daily. 90 capsule 3  . propranolol (INDERAL) 10 MG tablet Take 1 tablet (10 mg total) by mouth 4 (four) times daily as needed (palpitations or fast heart rate). 60 tablet 6   No current facility-administered medications for this visit.    Allergies:   Sulfa antibiotics, Sulfonamide derivatives, Milk-related compounds, Atenolol, Erythromycin, and Plavix [clopidogrel bisulfate]    Social History:  The patient  reports that she has never smoked. She has never used smokeless tobacco. She reports that she does not drink alcohol and does not use drugs.   Family History:  The patient's family history includes Atrial fibrillation in her mother; Diabetes in her mother; Emphysema in her father; Hypertension in her mother; Other in her brother.    ROS:  Please see the history of present illness.   Otherwise, review of systems are positive for none.   All other systems are reviewed and  negative.     Physical Exam: Blood pressure 122/78, pulse 72, height 5\' 7"  (1.702 m), weight 291 lb 12.8 oz (132.4 kg), last menstrual period 07/22/2010, SpO2 97 %.  GEN:  Well nourished, well developed in no acute distress HEENT: Normal NECK: No JVD; No carotid bruits LYMPHATICS: No lymphadenopathy CARDIAC: RRR , no murmurs, rubs, gallops RESPIRATORY:  Clear to auscultation without rales, wheezing or rhonchi  ABDOMEN: Soft, non-tender,  non-distended MUSCULOSKELETAL:  No edema; left hand is bandaged  SKIN: Warm and dry NEUROLOGIC:  Alert and oriented x 3  EKG:    Feb. 1, 2022:   NSR at 72.   No ST or T abn.    Recent Labs: 01/06/2020: Platelets 246 02/19/2020: Hemoglobin 16.0 06/06/2020: ALT 19; BUN 20; Creatinine, Ser 0.77; Potassium 3.9; Sodium 141    Lipid Panel    Component Value Date/Time   CHOL 181 06/06/2020 0817   TRIG 119 06/06/2020 0817   HDL 47 06/06/2020 0817   CHOLHDL 3.9 06/06/2020 0817   CHOLHDL 3.4 07/02/2015 0737   VLDL 16 07/02/2015 0737   LDLCALC 113 (H) 06/06/2020 0817      Wt Readings from Last 3 Encounters:  07/09/20 291 lb 12.8 oz (132.4 kg)  02/19/20 280 lb (127 kg)  02/16/20 284 lb (128.8 kg)      Other studies Reviewed: Additional studies/ records that were reviewed today include: . Review of the above records demonstrates:    ASSESSMENT AND PLAN:  1.  Paroxysmal atrial fib:     Is  In NSR today .  On eliquis   2. Hypertension:   BP is well preserved    3. Hyperlipidemia:   LDL is mildly elevated.  Continue diet and exercise when she can    4. Obesity:     Encourage weight loss      Current medicines are reviewed at length with the patient today.  The patient does not have concerns regarding medicines.  The following changes have been made:  no change  Labs/ tests ordered today include:   Orders Placed This Encounter  Procedures  . Lipid Profile  . ALT  . Basic Metabolic Panel (BMET)  . EKG 12-Lead     Disposition:   1 year office visit    Signed, Mertie Moores, MD  07/09/2020 6:48 PM    [facility] [address], [place], [place]  [address] Phone: [phone]; Fax: [phone]

## 2020-07-09 ENCOUNTER — Ambulatory Visit: Payer: Medicare PPO | Admitting: Cardiovascular Disease

## 2020-07-09 ENCOUNTER — Telehealth: Payer: Self-pay

## 2020-07-09 ENCOUNTER — Other Ambulatory Visit: Payer: Self-pay

## 2020-07-09 ENCOUNTER — Encounter: Payer: Self-pay | Admitting: Cardiovascular Disease

## 2020-07-09 VITALS — BP 122/78 | HR 72 | Ht 67.0 in | Wt 291.8 lb

## 2020-07-09 DIAGNOSIS — I480 Paroxysmal atrial fibrillation: Secondary | ICD-10-CM | POA: Diagnosis not present

## 2020-07-09 DIAGNOSIS — E7849 Other hyperlipidemia: Secondary | ICD-10-CM

## 2020-07-09 MED ORDER — PROPRANOLOL HCL 10 MG PO TABS
10.0000 mg | ORAL_TABLET | Freq: Four times a day (QID) | ORAL | 6 refills | Status: DC | PRN
Start: 2020-07-09 — End: 2022-07-06

## 2020-07-09 MED ORDER — DILTIAZEM HCL ER COATED BEADS 240 MG PO CP24: 240.0000 mg | ORAL_CAPSULE | Freq: Every day | ORAL | 3 refills | Status: DC

## 2020-07-09 NOTE — Patient Instructions (Signed)
Medication Instructions:  Your physician recommends that you continue on your current medications as directed. Please refer to the Current Medication list given to you today.  *If you need a refill on your cardiac medications before your next appointment, please call your pharmacy*   Lab Work: Please return prior to your appointment for your one year follow-up.    Testing/Procedures: None ordered   Follow-Up: At [facility], you and your health needs are our priority.  As part of our continuing mission to provide you with exceptional heart care, we have created designated Provider Care Teams.  These Care Teams include your primary Cardiologist (physician) and Advanced Practice Providers (APPs -  Physician Assistants and Nurse Practitioners) who all work together to provide you with the care you need, when you need it.   Your next appointment:   1 year(s)  The format for your next appointment:   In Person  Provider:   You may see Mertie Moores, MD or one of the following Advanced Practice Providers on your designated Care Team:    Richardson Dopp, PA-C  [place], [place]

## 2020-07-09 NOTE — Telephone Encounter (Signed)
Patient fell one week ago and has bruising on R LE which "she did the splits and came down hard on right leg". She is concerned the 2018 bypass graft is down. She has seen GP and cardiologist since the fall. She says she has 5-6 pounds of bad blood in the R LE. According to patient, leg is warm and is just sore. Says GP and cardiologist confirm DP. Discussed with PA, unlikely it is a graft issue - patient to follow up with GP - may order DVT study. Patient verbalizes understanding.

## 2020-07-11 ENCOUNTER — Other Ambulatory Visit (HOSPITAL_COMMUNITY): Payer: Self-pay | Admitting: Family Medicine

## 2020-07-11 ENCOUNTER — Other Ambulatory Visit: Payer: Self-pay

## 2020-07-11 ENCOUNTER — Ambulatory Visit (HOSPITAL_COMMUNITY)
Admission: RE | Admit: 2020-07-11 | Discharge: 2020-07-11 | Disposition: A | Discharge status: Home / Self Care | Payer: Medicare PPO | Source: Ambulatory Visit | Attending: Family Medicine | Admitting: Family Medicine

## 2020-07-11 DIAGNOSIS — M79604 Pain in right leg: Secondary | ICD-10-CM | POA: Diagnosis not present

## 2020-08-05 NOTE — Progress Notes (Signed)
GYNECOLOGY  VISIT  CC:   Vaginal symptom  HPI: 75 y.o. G42P2002 Married White or Caucasian female here for vaginal discharge, itching,irritation odor & pea size bump in vaginal area.   Experiencing vaginal symptoms as above off/on since October when she started antibiotics for cellulitis. Took Doxycycline. Treated herself with 3 day treatment for yeast, had to treat herself in October twice.  Had surgery on her finger and placed on Keflex end of January through beginning of February and symptoms returned. Treated herself with another Monistat a couple weeks ago. Symptoms never completely resolved but seems to get better at first.  Has brown discharge Hx Hyst/BSO in 2012 Had a ASC-H pap in 2011, no pap since hysterectomy  GYNECOLOGIC HISTORY: Patient's last menstrual period was 07/22/2010. Contraception: hysterectomy Menopausal hormone therapy: none  Patient Active Problem List   Diagnosis Date Noted  . Colonic diverticular abscess 12/23/2018  . Morbid obesity with body mass index (BMI) of 40.0 or higher ([place]) 12/11/2018  . On continuous oral anticoagulation 12/11/2018  . Atrial fibrillation ([place]) 12/11/2018  . Perforated bowel ([place]) 12/10/2018  . Pain in right foot 02/23/2018  . Pain of left hip joint 08/11/2017  . Stiffness of right knee 07/28/2017  . 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 Trochanteric bursitis of left hip 07/02/2017  . Knee dislocation 05/17/2017  . Popliteal artery injury 05/17/2017  . Hypokalemia 05/17/2017  . Leukocytosis 05/17/2017  . Injury of right popliteal artery 05/17/2017  . HTN (hypertension) 07/02/2015  . Hyperlipidemia 02/04/2011  . Hyperglycemia 02/04/2011  . Leg edema 02/04/2011  . PAF (paroxysmal atrial fibrillation) ([place]) 02/02/2011  . Leg swelling 02/02/2011    Past Medical History:  Diagnosis Date  . Abnormal Pap smear    Rare ASCUS-H  . Arthritis   . Atrial fibrillation ([place])   . Cancer (HCC)    melanoma rt leg  . Chest  pain   . Diabetes mellitus without complication ([place])   . Diverticulitis   . DVT (deep venous thrombosis) ([place])   . HTN (hypertension) 07/02/2015  . Hypercholesteremia   . Hyperglycemia 02/04/2011  . Hyperlipidemia 02/04/2011  . Hypertension    per pt no htn but needs meds for other reason  . Hypokalemia 05/17/2017  . Injury of right popliteal artery 05/17/2017  . Knee dislocation 05/17/2017  . Leg edema 02/04/2011  . Leg swelling 02/02/2011  . Leukocytosis 05/17/2017  . Obesity   . PAF (paroxysmal atrial fibrillation) ([place]) 02/02/2011  . Perforated sigmoid colon ([place])    from diverticulitis  . Popliteal artery injury 05/17/2017    Past Surgical History:  Procedure Laterality Date  . ABDOMINAL AORTOGRAM W/LOWER EXTREMITY Bilateral 02/19/2020   Procedure: ABDOMINAL AORTOGRAM W/LOWER EXTREMITY;  Surgeon: Waynetta Sandy, MD;  Location: [facility];  Service: Cardiovascular;  Laterality: Bilateral;  Rt.  lower  leg  . ABDOMINAL HYSTERECTOMY    . broken finger    . BYPASS GRAFT POPLITEAL TO POPLITEAL Right 05/17/2017   Procedure: BYPASS GRAFT ABOVE KNEE POPLITEAL TO BELOW KNEE POPLITEAL USING NONREVERSED RIGHT GREATER SAPHENOUS VEIN;  Surgeon: Waynetta Sandy, MD;  Location: [facility];  Service: Vascular;  Laterality: Right;  . CARDIAC CATHETERIZATION  07/11/2010   Est. EF of 60-65% -- Smooth and normal coronary arteries  -- Normal LV systolic function   . CATARACT EXTRACTION, BILATERAL     08-12-17, 08-26-17  . CHOLECYSTECTOMY    . CYSTOCELE REPAIR  07/22/2010   Vault suspension, cystocele repair, graft and  cystoscopy  . FINGER SURGERY    . MELANOMA EXCISION     right leg 3/12  . NASAL SINUS SURGERY    . NEUROMA SURGERY     rt foot  . TONSILLECTOMY    . TOTAL KNEE ARTHROPLASTY  06/20/2009   left knee  . TOTAL KNEE ARTHROPLASTY  12/27/2008   right knee  . TOTAL VAGINAL HYSTERECTOMY  07/22/2010   with bilateral salpingo-oophorectomy by Dr. Joan Flores  . VEIN  HARVEST Right 05/17/2017   Procedure: RIGHT GREATER SAPHENOUS VEIN HARVEST;  Surgeon: Waynetta Sandy, MD;  Location: [facility];  Service: Vascular;  Laterality: Right;    MEDS:   Current Outpatient Medications on File Prior to Visit  Medication Sig Dispense Refill  . acetaminophen (TYLENOL) 500 MG tablet Take 500-1,000 mg by mouth every 6 (six) hours as needed for moderate pain or headache.    . Ascorbic Acid (VITAMIN C) 1000 MG tablet Take 1,000 mg by mouth daily.    Marland Kitchen aspirin EC 81 MG tablet Take 81 mg by mouth daily.     . Biotin 5 MG CAPS Take 5 mg by mouth daily.    . Calcium Carb-Cholecalciferol (CALCIUM + D3 PO) Take 1 tablet by mouth daily.    . Cholecalciferol (VITAMIN D) 50 MCG (2000 UT) tablet Take 2,000 Units by mouth daily.    Marland Kitchen diltiazem (CARDIZEM CD) 240 MG 24 hr capsule Take 1 capsule (240 mg total) by mouth daily. 90 capsule 3  . docusate sodium (COLACE) 100 MG capsule Take 100 mg by mouth daily.    Marland Kitchen ELIQUIS 5 MG TABS tablet TAKE 1 TABLET BY MOUTH 2 TIMES DAILY. 60 tablet 5  . Flaxseed, Linseed, (FLAXSEED OIL) 1200 MG CAPS Take 1,200 mg by mouth daily.    . fluticasone (FLONASE) 50 MCG/ACT nasal spray Place 1-2 sprays into both nostrils at bedtime as needed for allergies.    Marland Kitchen loratadine (CLARITIN) 10 MG tablet Take 10 mg by mouth daily.    . Multiple Vitamin (MULTIVITAMIN WITH MINERALS) TABS tablet Take 1 tablet by mouth daily.    . Omega-3 Fatty Acids (FISH OIL) 1200 MG CAPS Take 1,200 mg by mouth daily.    Marland Kitchen omeprazole (PRILOSEC) 20 MG capsule Take 20 mg by mouth daily.     Marland Kitchen PATADAY 0.2 % SOLN Place 1 drop into both eyes daily.   4  . potassium chloride SA (KLOR-CON) 20 MEQ tablet Take 0.5 tablets (10 mEq total) by mouth daily. 45 tablet 3  . propranolol (INDERAL) 10 MG tablet Take 1 tablet (10 mg total) by mouth 4 (four) times daily as needed (palpitations or fast heart rate). 60 tablet 6  . Psyllium (METAMUCIL FIBER PO) Take by mouth. 5 capsules 3 times day     . Pyridoxine HCl (B-6 PO) Take by mouth.    . triamterene-hydrochlorothiazide (MAXZIDE) 75-50 MG tablet Take 1 tablet by mouth daily.    Marland Kitchen amoxicillin (AMOXIL) 500 MG capsule Take 4 capsules by mouth as needed. TAKE 4 CAPSULES BY MOUTH 1 HOUR PRIOR TO APPOINTMENT (Patient not taking: Reported on 08/06/2020)    . [DISCONTINUED] potassium chloride (KLOR-CON) 10 MEQ tablet TAKE 1 TABLET BY MOUTH DAILY. (Patient not taking: Reported on 02/16/2020) 90 tablet 2   No current facility-administered medications on file prior to visit.    ALLERGIES: Sulfa antibiotics, Sulfonamide derivatives, Milk-related compounds, Atenolol, Erythromycin, and Plavix [clopidogrel bisulfate]  Family History  Problem Relation Age of Onset  . Emphysema Father   .  Atrial fibrillation Mother        has pacemaker  . Diabetes Mother   . Hypertension Mother   . Other Brother        bulbar palsy    SH:  Sexually active with husband  Review of Systems  Constitutional: Negative.   HENT: Negative.   Eyes: Negative.   Respiratory: Negative.   Cardiovascular: Negative.   Gastrointestinal: Negative.   Endocrine: Negative.   Genitourinary:       Vaginal itching, irritation, brown discharge with odor, pea size bump in vaginal area  Musculoskeletal: Negative.   Skin: Negative.   Allergic/Immunologic: Negative.   Neurological: Negative.   Hematological: Negative.   Psychiatric/Behavioral: Negative.     PHYSICAL EXAMINATION:    BP 118/72   Pulse 76   Resp 16   Wt 280 lb (127 kg)   LMP 07/22/2010   BMI 43.85 kg/m     General appearance: alert, cooperative, no acute distress Lymph:  no inguinal LAD noted  Pelvic: External genitalia: small sebaceous cyst left inner labia majora, reassured              Urethra:  Redness noted              Bartholins and Skenes: normal                 Vagina: pelvic relaxation, cystocele/rectocele, small amount blood noted, PH 5              Cervix: absent              Bimanual  Exam:  Uterus:  uterus absent              Adnexa: no mass, fullness, tenderness               Chaperone, Joy, CMA, was present for exam.  Assessment: Vaginal discharge, bloody - Plan: Cytology - PAP( [place])  Vaginal irritation - Plan: WET PREP FOR TRICH, YEAST, CLUE  Atrophic vaginitis - Plan: conjugated estrogens (PREMARIN) vaginal cream   Plan: F/u 3 WEEKS for re-evaluation

## 2020-08-06 ENCOUNTER — Ambulatory Visit: Payer: Medicare PPO | Admitting: Nurse Practitioner

## 2020-08-06 ENCOUNTER — Other Ambulatory Visit (HOSPITAL_COMMUNITY)
Admission: RE | Admit: 2020-08-06 | Discharge: 2020-08-06 | Disposition: A | Discharge status: Home / Self Care | Payer: Medicare PPO | Source: Ambulatory Visit | Attending: Nurse Practitioner | Admitting: Nurse Practitioner

## 2020-08-06 ENCOUNTER — Other Ambulatory Visit: Payer: Self-pay

## 2020-08-06 ENCOUNTER — Encounter: Payer: Self-pay | Admitting: Nurse Practitioner

## 2020-08-06 VITALS — BP 118/72 | HR 76 | Resp 16 | Wt 280.0 lb

## 2020-08-06 DIAGNOSIS — Z1151 Encounter for screening for human papillomavirus (HPV): Secondary | ICD-10-CM | POA: Diagnosis not present

## 2020-08-06 DIAGNOSIS — Z01419 Encounter for gynecological examination (general) (routine) without abnormal findings: Secondary | ICD-10-CM | POA: Insufficient documentation

## 2020-08-06 DIAGNOSIS — N952 Postmenopausal atrophic vaginitis: Secondary | ICD-10-CM

## 2020-08-06 DIAGNOSIS — N898 Other specified noninflammatory disorders of vagina: Secondary | ICD-10-CM | POA: Insufficient documentation

## 2020-08-06 LAB — WET PREP FOR TRICH, YEAST, CLUE

## 2020-08-06 MED ORDER — PREMARIN 0.625 MG/GM VA CREA: TOPICAL_CREAM | VAGINAL | 1 refills | Status: AC

## 2020-08-06 NOTE — Patient Instructions (Signed)
Atrophic Vaginitis  Atrophic vaginitis is when the lining of the vagina becomes dry and thin. This is most common in women who have stopped having their periods (are in menopause). It usually starts when a woman is 45 to 74 years old. What are the causes? This condition is caused by a drop in a female hormone (estrogen). What increases the risk? You are more likely to develop this condition if:  You take certain medicines.  You have had your ovaries taken out.  You are being treated for cancer.  You have given birth or are breastfeeding.  You are more than 74 years old.  You smoke. What are the signs or symptoms?  Pain during sex.  A feeling of pressure during sex.  Bleeding during sex.  Burning or itching in the vagina.  Burning pain when you pee (urinate).  Fluid coming from your vagina. Some people do not have symptoms. How is this treated?  Using a lubricant before sex.  Using a moisturizer in the vagina.  Using estrogen in the vagina. In some cases, you may not need treatment. Follow these instructions at home: Medicines  Take all medicines only as told by your doctor. This includes medicines for dryness.  Do not use herbal medicines unless your doctor says it is okay. General instructions  Talk with your doctor about treatment.  Do not douche.  Do not use scented: ? Sprays. ? Tampons. ? Soaps.  If sex hurts, try using lubricants right before you have sex. Contact a doctor if:  You have fluid coming from the vagina that is not like normal.  You have a bad smell coming from your vagina.  You have new symptoms.  Your symptoms do not get better when treated.  Your symptoms get worse. Summary  This condition happens when the lining of the vagina becomes dry and thin.  It is most common in women who no longer have periods.  Treatment may include using medicines for dryness.  Call a doctor if your symptoms do not get better. This  information is not intended to replace advice given to you by your health care provider. Make sure you discuss any questions you have with your health care provider. Document Revised: 11/23/2019 Document Reviewed: 11/23/2019 Elsevier Patient Education  2021 [employer]   

## 2020-08-08 LAB — CYTOLOGY - PAP
Comment: NEGATIVE
Diagnosis: NEGATIVE
Diagnosis: REACTIVE
High risk HPV: NEGATIVE

## 2020-08-26 NOTE — Progress Notes (Signed)
GYNECOLOGY  VISIT  CC:   Follow up for Atrophic vaginitis  HPI: 75 y.o. G2P2002 Married White or Caucasian female here for f/u of atrophic vaginitis.  Used Premarian vaginal cream as prescribed, initially only 5 days instead of 7, because she read the package insert and side effects scared her. She continues to use a small amount twice a week, it is difficult for her to use the applicator.   Discharge is less and itching less with premarin cream, has not had any blood in discharge, but the discharge looks brownish at times, so wonders if there could be blood. (That is what brought her in for initial evaluation). She feels like she does have more discharge the day after using premarin cream.  Currently on Eloquis and aspirin Pt had pap ASC-H prior to hyst, vaginal pap smear collected last visit. It was normal/neg HR HPV   GYNECOLOGIC HISTORY: Patient's last menstrual period was 07/22/2010. Contraception: hysterectomy Menopausal hormone therapy: premarin cream  Patient Active Problem List   Diagnosis Date Noted  . Closed fracture of base of proximal phalanx of finger 07/02/2020  . Primary localized osteoarthrosis of ankle and foot 04/10/2020  . Pain in left foot 04/10/2020  . Colonic diverticular abscess 12/23/2018  . Morbid obesity with body mass index (BMI) of 40.0 or higher ([place]) 12/11/2018  . On continuous oral anticoagulation 12/11/2018  . Atrial fibrillation ([place]) 12/11/2018  . Perforated bowel ([place]) 12/10/2018  . Pain in right foot 02/23/2018  . Pain of left hip joint 08/11/2017  . Stiffness of right knee 07/28/2017  . 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 Trochanteric bursitis of left hip 07/02/2017  . Knee dislocation 05/17/2017  . Popliteal artery injury 05/17/2017  . Hypokalemia 05/17/2017  . Leukocytosis 05/17/2017  . Injury of right popliteal artery 05/17/2017  . HTN (hypertension) 07/02/2015  . Hyperlipidemia 02/04/2011  . Hyperglycemia 02/04/2011  . Leg edema  02/04/2011  . PAF (paroxysmal atrial fibrillation) ([place]) 02/02/2011  . Leg swelling 02/02/2011    Past Medical History:  Diagnosis Date  . Abnormal Pap smear    Rare ASCUS-H  . Arthritis   . Atrial fibrillation ([facility])   . Cancer (HCC)    melanoma rt leg  . Chest pain   . Diabetes mellitus without complication ([place])   . Diverticulitis   . DVT (deep venous thrombosis) ([place])   . HTN (hypertension) 07/02/2015  . Hypercholesteremia   . Hyperglycemia 02/04/2011  . Hyperlipidemia 02/04/2011  . Hypertension    per pt no htn but needs meds for other reason  . Hypokalemia 05/17/2017  . Injury of right popliteal artery 05/17/2017  . Knee dislocation 05/17/2017  . Leg edema 02/04/2011  . Leg swelling 02/02/2011  . Leukocytosis 05/17/2017  . Obesity   . PAF (paroxysmal atrial fibrillation) ([place]) 02/02/2011  . Perforated sigmoid colon ([place])    from diverticulitis  . Popliteal artery injury 05/17/2017    Past Surgical History:  Procedure Laterality Date  . ABDOMINAL AORTOGRAM W/LOWER EXTREMITY Bilateral 02/19/2020   Procedure: ABDOMINAL AORTOGRAM W/LOWER EXTREMITY;  Surgeon: Waynetta Sandy, MD;  Location: [facility];  Service: Cardiovascular;  Laterality: Bilateral;  Rt.  lower  leg  . ABDOMINAL HYSTERECTOMY    . broken finger    . BYPASS GRAFT POPLITEAL TO POPLITEAL Right 05/17/2017   Procedure: BYPASS GRAFT ABOVE KNEE POPLITEAL TO BELOW KNEE POPLITEAL USING NONREVERSED RIGHT GREATER SAPHENOUS VEIN;  Surgeon: Waynetta Sandy, MD;  Location: [facility];  Service:  Vascular;  Laterality: Right;  . CARDIAC CATHETERIZATION  07/11/2010   Est. EF of 60-65% -- Smooth and normal coronary arteries  -- Normal LV systolic function   . CATARACT EXTRACTION, BILATERAL     08-12-17, 08-26-17  . CHOLECYSTECTOMY    . CYSTOCELE REPAIR  07/22/2010   Vault suspension, cystocele repair, graft and cystoscopy  . FINGER SURGERY    . MELANOMA EXCISION     right leg 3/12  . NASAL SINUS  SURGERY    . NEUROMA SURGERY     rt foot  . TONSILLECTOMY    . TOTAL KNEE ARTHROPLASTY  06/20/2009   left knee  . TOTAL KNEE ARTHROPLASTY  12/27/2008   right knee  . TOTAL VAGINAL HYSTERECTOMY  07/22/2010   with bilateral salpingo-oophorectomy by Dr. Joan Flores  . VEIN HARVEST Right 05/17/2017   Procedure: RIGHT GREATER SAPHENOUS VEIN HARVEST;  Surgeon: Waynetta Sandy, MD;  Location: [facility];  Service: Vascular;  Laterality: Right;    MEDS:   Current Outpatient Medications on File Prior to Visit  Medication Sig Dispense Refill  . acetaminophen (TYLENOL) 500 MG tablet Take 500-1,000 mg by mouth every 6 (six) hours as needed for moderate pain or headache.    . Ascorbic Acid (VITAMIN C) 1000 MG tablet Take 1,000 mg by mouth daily.    Marland Kitchen aspirin EC 81 MG tablet Take 81 mg by mouth daily.     . Biotin 5 MG CAPS Take 5 mg by mouth daily.    . Calcium Carb-Cholecalciferol (CALCIUM + D3 PO) Take 1 tablet by mouth daily.    . Cholecalciferol (VITAMIN D) 50 MCG (2000 UT) tablet Take 2,000 Units by mouth daily.    Marland Kitchen conjugated estrogens (PREMARIN) vaginal cream 1/2 gram vaginally twice weekly 30 g 1  . diltiazem (CARDIZEM CD) 240 MG 24 hr capsule Take 1 capsule (240 mg total) by mouth daily. 90 capsule 3  . docusate sodium (COLACE) 100 MG capsule Take 100 mg by mouth daily.    Marland Kitchen ELIQUIS 5 MG TABS tablet TAKE 1 TABLET BY MOUTH 2 TIMES DAILY. 60 tablet 5  . Flaxseed, Linseed, (FLAXSEED OIL) 1200 MG CAPS Take 1,200 mg by mouth daily.    . fluticasone (FLONASE) 50 MCG/ACT nasal spray Place 1-2 sprays into both nostrils at bedtime as needed for allergies.    Marland Kitchen loratadine (CLARITIN) 10 MG tablet Take 10 mg by mouth daily.    . Multiple Vitamin (MULTIVITAMIN WITH MINERALS) TABS tablet Take 1 tablet by mouth daily.    . Omega-3 Fatty Acids (FISH OIL) 1200 MG CAPS Take 1,200 mg by mouth daily.    Marland Kitchen omeprazole (PRILOSEC) 20 MG capsule Take 20 mg by mouth daily.     Marland Kitchen PATADAY 0.2 % SOLN Place 1 drop  into both eyes daily.   4  . potassium chloride SA (KLOR-CON) 20 MEQ tablet Take 0.5 tablets (10 mEq total) by mouth daily. 45 tablet 3  . propranolol (INDERAL) 10 MG tablet Take 1 tablet (10 mg total) by mouth 4 (four) times daily as needed (palpitations or fast heart rate). 60 tablet 6  . Psyllium (METAMUCIL FIBER PO) Take by mouth. 5 capsules 3 times day    . Pyridoxine HCl (B-6 PO) Take by mouth.    . triamterene-hydrochlorothiazide (MAXZIDE) 75-50 MG tablet Take 1 tablet by mouth daily.    Marland Kitchen amoxicillin (AMOXIL) 500 MG capsule Take 4 capsules by mouth as needed. TAKE 4 CAPSULES BY MOUTH 1 HOUR PRIOR TO  APPOINTMENT (Patient not taking: Reported on 08/29/2020)    . [DISCONTINUED] potassium chloride (KLOR-CON) 10 MEQ tablet TAKE 1 TABLET BY MOUTH DAILY. (Patient not taking: Reported on 02/16/2020) 90 tablet 2   No current facility-administered medications on file prior to visit.    ALLERGIES: Sulfa antibiotics, Sulfonamide derivatives, Milk-related compounds, Atenolol, Erythromycin, and Plavix [clopidogrel bisulfate]  Family History  Problem Relation Age of Onset  . Emphysema Father   . Atrial fibrillation Mother        has pacemaker  . Diabetes Mother   . Hypertension Mother   . Other Brother        bulbar palsy     Review of Systems  Constitutional: Negative.   HENT: Negative.   Eyes: Negative.   Respiratory: Negative.   Cardiovascular: Negative.   Gastrointestinal: Negative.   Endocrine: Negative.   Genitourinary: Positive for vaginal discharge. Negative for vaginal pain.       Continued discharge, but it is improved  Musculoskeletal: Negative.   Skin: Negative.   Allergic/Immunologic: Negative.   Neurological: Negative.   Psychiatric/Behavioral: Negative.     PHYSICAL EXAMINATION:    BP 120/70   Pulse 70   Resp 16   Wt 279 lb (126.6 kg)   LMP 07/22/2010   BMI 43.70 kg/m     General appearance: alert, cooperative, no acute distress  Pelvic: External genitalia:   no lesions              Urethra:  normal appearing urethra with no masses, tenderness or lesions              Bartholins and Skenes: normal                 Vagina: red, thin tissue, small amount yellow discharge    Chaperone, Joy, CMA, was present for exam.  Assessment: Atrophic vaginitis  Plan: Continue Premarin twice weekly as it is helpful. Advised patient that with this condition, cream can be used temporary if desired. Discussed Pro/cons.

## 2020-08-27 ENCOUNTER — Other Ambulatory Visit: Payer: Self-pay

## 2020-08-27 DIAGNOSIS — S85001A Unspecified injury of popliteal artery, right leg, initial encounter: Secondary | ICD-10-CM

## 2020-08-29 ENCOUNTER — Ambulatory Visit: Payer: Medicare PPO | Admitting: Nurse Practitioner

## 2020-08-29 ENCOUNTER — Encounter: Payer: Self-pay | Admitting: Nurse Practitioner

## 2020-08-29 ENCOUNTER — Other Ambulatory Visit: Payer: Self-pay

## 2020-08-29 VITALS — BP 120/70 | HR 70 | Resp 16 | Wt 279.0 lb

## 2020-08-29 DIAGNOSIS — N952 Postmenopausal atrophic vaginitis: Secondary | ICD-10-CM

## 2020-08-29 NOTE — Patient Instructions (Signed)
There is minimal risk using vaginal estrogen in the low dose that we discussed (or less) considering your history. The use of this medication is not mandatory. Only you can decide if the benefit of vaginal estrogen outweighs the risk. I have attached clinical guidance from [employer] (click the link):  [email or web address]

## 2020-08-30 ENCOUNTER — Ambulatory Visit: Payer: Medicare PPO | Admitting: Vascular Surgery

## 2020-08-30 ENCOUNTER — Ambulatory Visit (INDEPENDENT_AMBULATORY_CARE_PROVIDER_SITE_OTHER)
Admission: RE | Admit: 2020-08-30 | Discharge: 2020-08-30 | Disposition: A | Discharge status: Home / Self Care | Payer: Medicare PPO | Source: Ambulatory Visit | Attending: Vascular Surgery | Admitting: Vascular Surgery

## 2020-08-30 ENCOUNTER — Encounter: Payer: Self-pay | Admitting: Vascular Surgery

## 2020-08-30 ENCOUNTER — Ambulatory Visit (HOSPITAL_COMMUNITY)
Admission: RE | Admit: 2020-08-30 | Discharge: 2020-08-30 | Disposition: A | Discharge status: Home / Self Care | Payer: Medicare PPO | Source: Ambulatory Visit | Attending: Vascular Surgery | Admitting: Vascular Surgery

## 2020-08-30 VITALS — BP 105/71 | HR 74 | Temp 97.9°F | Resp 20 | Ht 67.0 in | Wt 278.0 lb

## 2020-08-30 DIAGNOSIS — S85001A Unspecified injury of popliteal artery, right leg, initial encounter: Secondary | ICD-10-CM

## 2020-08-30 DIAGNOSIS — I824Y9 Acute embolism and thrombosis of unspecified deep veins of unspecified proximal lower extremity: Secondary | ICD-10-CM

## 2020-08-30 NOTE — Progress Notes (Signed)
Patient ID: [name], female   DOB: [birth year], 75 y.o.   MRN: [id number]  Reason for Consult: Follow-up   Referred by Leonard Downing, *  Subjective:     HPI:  [name] is a 75 y.o. female has a history of right above-knee to below-knee popliteal artery bypass with vein performed for acute right lower extremity ischemia with knee dislocation.  At that time she was on Eliquis.  She now remains on Eliquis for paroxysmal atrial fibrillation and recently had a peroneal DVT.  She recently fell after stepping on a [place]yla bone she injured her left index finger for which she has had surgical repair.  She also thinks that she hurt her right leg did have some hematoma but overall her foot has felt well.  She has swelling in the right greater than left lower extremity for which she does wear compression stockings she is also been elevating her leg.  At last visit she had decreased velocities in her right lower extremity bypass that she underwent angiography with no intervention undertaken.  Past Medical History:  Diagnosis Date  . Abnormal Pap smear    Rare ASCUS-H  . Arthritis   . Atrial fibrillation ([place])   . Cancer (HCC)    melanoma rt leg  . Chest pain   . Diabetes mellitus without complication ([place])   . Diverticulitis   . DVT (deep venous thrombosis) ([place])   . HTN (hypertension) 07/02/2015  . Hypercholesteremia   . Hyperglycemia 02/04/2011  . Hyperlipidemia 02/04/2011  . Hypertension    per pt no htn but needs meds for other reason  . Hypokalemia 05/17/2017  . Injury of right popliteal artery 05/17/2017  . Knee dislocation 05/17/2017  . Leg edema 02/04/2011  . Leg swelling 02/02/2011  . Leukocytosis 05/17/2017  . Obesity   . PAF (paroxysmal atrial fibrillation) ([place]) 02/02/2011  . Perforated sigmoid colon ([place])    from diverticulitis  . Popliteal artery injury 05/17/2017   Family History  Problem Relation Age of Onset  . Emphysema Father   . Atrial  fibrillation Mother        has pacemaker  . Diabetes Mother   . Hypertension Mother   . Other Brother        bulbar palsy   Past Surgical History:  Procedure Laterality Date  . ABDOMINAL AORTOGRAM W/LOWER EXTREMITY Bilateral 02/19/2020   Procedure: ABDOMINAL AORTOGRAM W/LOWER EXTREMITY;  Surgeon: Waynetta Sandy, MD;  Location: [facility];  Service: Cardiovascular;  Laterality: Bilateral;  Rt.  lower  leg  . ABDOMINAL HYSTERECTOMY    . broken finger    . BYPASS GRAFT POPLITEAL TO POPLITEAL Right 05/17/2017   Procedure: BYPASS GRAFT ABOVE KNEE POPLITEAL TO BELOW KNEE POPLITEAL USING NONREVERSED RIGHT GREATER SAPHENOUS VEIN;  Surgeon: Waynetta Sandy, MD;  Location: [facility];  Service: Vascular;  Laterality: Right;  . CARDIAC CATHETERIZATION  07/11/2010   Est. EF of 60-65% -- Smooth and normal coronary arteries  -- Normal LV systolic function   . CATARACT EXTRACTION, BILATERAL     08-12-17, 08-26-17  . CHOLECYSTECTOMY    . CYSTOCELE REPAIR  07/22/2010   Vault suspension, cystocele repair, graft and cystoscopy  . FINGER SURGERY    . MELANOMA EXCISION     right leg 3/12  . NASAL SINUS SURGERY    . NEUROMA SURGERY     rt foot  . TONSILLECTOMY    . TOTAL KNEE ARTHROPLASTY  06/20/2009  left knee  . TOTAL KNEE ARTHROPLASTY  12/27/2008   right knee  . TOTAL VAGINAL HYSTERECTOMY  07/22/2010   with bilateral salpingo-oophorectomy by Dr. Joan Flores  . VEIN HARVEST Right 05/17/2017   Procedure: RIGHT GREATER SAPHENOUS VEIN HARVEST;  Surgeon: Waynetta Sandy, MD;  Location: [facility];  Service: Vascular;  Laterality: Right;    Short Social History:  Social History   Tobacco Use  . Smoking status: Never Smoker  . Smokeless tobacco: Never Used  Substance Use Topics  . Alcohol use: No    Allergies  Allergen Reactions  . Sulfa Antibiotics Other (See Comments)    Turned eyes yellow   . Sulfonamide Derivatives Rash    Turned eyes yellow  . Milk-Related  Compounds Diarrhea  . Atenolol Cough  . Erythromycin Nausea Only  . Plavix [Clopidogrel Bisulfate] Cough    Current Outpatient Medications  Medication Sig Dispense Refill  . acetaminophen (TYLENOL) 500 MG tablet Take 500-1,000 mg by mouth every 6 (six) hours as needed for moderate pain or headache.    Marland Kitchen amoxicillin (AMOXIL) 500 MG capsule Take 4 capsules by mouth as needed. TAKE 4 CAPSULES BY MOUTH 1 HOUR PRIOR TO APPOINTMENT    . Ascorbic Acid (VITAMIN C) 1000 MG tablet Take 1,000 mg by mouth daily.    Marland Kitchen aspirin EC 81 MG tablet Take 81 mg by mouth daily.     . Biotin 5 MG CAPS Take 5 mg by mouth daily.    . Calcium Carb-Cholecalciferol (CALCIUM + D3 PO) Take 1 tablet by mouth daily.    . Cholecalciferol (VITAMIN D) 50 MCG (2000 UT) tablet Take 2,000 Units by mouth daily.    Marland Kitchen conjugated estrogens (PREMARIN) vaginal cream 1/2 gram vaginally twice weekly 30 g 1  . diltiazem (CARDIZEM CD) 240 MG 24 hr capsule Take 1 capsule (240 mg total) by mouth daily. 90 capsule 3  . docusate sodium (COLACE) 100 MG capsule Take 100 mg by mouth daily.    Marland Kitchen ELIQUIS 5 MG TABS tablet TAKE 1 TABLET BY MOUTH 2 TIMES DAILY. 60 tablet 5  . Flaxseed, Linseed, (FLAXSEED OIL) 1200 MG CAPS Take 1,200 mg by mouth daily.    . fluticasone (FLONASE) 50 MCG/ACT nasal spray Place 1-2 sprays into both nostrils at bedtime as needed for allergies.    Marland Kitchen loratadine (CLARITIN) 10 MG tablet Take 10 mg by mouth daily.    . Multiple Vitamin (MULTIVITAMIN WITH MINERALS) TABS tablet Take 1 tablet by mouth daily.    . Omega-3 Fatty Acids (FISH OIL) 1200 MG CAPS Take 1,200 mg by mouth daily.    Marland Kitchen omeprazole (PRILOSEC) 20 MG capsule Take 20 mg by mouth daily.     Marland Kitchen PATADAY 0.2 % SOLN Place 1 drop into both eyes daily.   4  . potassium chloride SA (KLOR-CON) 20 MEQ tablet Take 0.5 tablets (10 mEq total) by mouth daily. 45 tablet 3  . propranolol (INDERAL) 10 MG tablet Take 1 tablet (10 mg total) by mouth 4 (four) times daily as needed  (palpitations or fast heart rate). 60 tablet 6  . Psyllium (METAMUCIL FIBER PO) Take by mouth. 5 capsules 3 times day    . Pyridoxine HCl (B-6 PO) Take by mouth.    . triamterene-hydrochlorothiazide (MAXZIDE) 75-50 MG tablet Take 1 tablet by mouth daily.     No current facility-administered medications for this visit.    Review of Systems  Constitutional:  Constitutional negative. HENT: HENT negative.  Eyes: Eyes negative.  Respiratory: Respiratory negative.  Cardiovascular: Positive for leg swelling.  GI: Gastrointestinal negative.  Musculoskeletal: Positive for leg pain.       Recent index finger surgery on the left Neurological: Neurological negative. Hematologic: Hematologic/lymphatic negative.  Psychiatric: Psychiatric negative.        Objective:  Objective   Vitals:   08/30/20 1106  BP: 105/71  Pulse: 74  Resp: 20  Temp: 97.9 F (36.6 C)  SpO2: 95%  Weight: 278 lb (126.1 kg)  Height: 5\' 7"  (1.702 m)   Body mass index is 43.54 kg/m.  Physical Exam HENT:     Head: Normocephalic.     Nose:     Comments: Wearing a mask Eyes:     Pupils: Pupils are equal, round, and reactive to light.  Cardiovascular:     Pulses:          Popliteal pulses are 2+ on the right side.       Dorsalis pedis pulses are 2+ on the right side and 2+ on the left side.       Posterior tibial pulses are 2+ on the right side and 2+ on the left side.  Pulmonary:     Effort: Pulmonary effort is normal.  Abdominal:     Palpations: Abdomen is soft.  Musculoskeletal:        General: Normal range of motion.     Right lower leg: Edema present.     Left lower leg: No edema.  Skin:    Capillary Refill: Capillary refill takes less than 2 seconds.  Neurological:     General: No focal deficit present.     Mental Status: She is alert.  Psychiatric:        Mood and Affect: Mood normal.        Behavior: Behavior normal.        Thought Content: Thought content normal.        Judgment: Judgment  normal.     Data: +-------+--------+-----+--------+---------+--------+  RIGHT PSV cm/sRatioStenosisWaveform Comments  +-------+--------+-----+--------+---------+--------+  SFA Mid84          triphasic      +-------+--------+-----+--------+---------+--------+       Right Graft #1: above knee to below knee popliteal  +------------------+--------+--------+---------+--------+           PSV cm/sStenosisWaveform Comments  +------------------+--------+--------+---------+--------+  Inflow      81       triphasic      +------------------+--------+--------+---------+--------+  Prox Anastomosis 33       biphasic       +------------------+--------+--------+---------+--------+  Proximal Graft  60       triphasic      +------------------+--------+--------+---------+--------+  Mid Graft     67       triphasic      +------------------+--------+--------+---------+--------+  Distal Graft   60       triphasic      +------------------+--------+--------+---------+--------+  Distal Anastomosis62       triphasic      +------------------+--------+--------+---------+--------+  Outflow      71       triphasic      +------------------+--------+--------+---------+--------+    Summary:  Right: Widely patent bypass graft and native inflow artery.         Assessment/Plan:    75 year old female status post right lower extremity bypass graft.  This remains patent.  She did recently go angiography with decreased velocities these were reproduced on today's visit.  Reviewing the angiogram I cannot definitively say why the velocities are decreased but  there was no identifiable causes.  She remains on Eliquis and aspirin.  She will follow-up in 1 year with repeat right lower extremity duplex and ABIs     Waynetta Sandy MD [facility]

## 2020-09-18 ENCOUNTER — Ambulatory Visit
Admission: RE | Admit: 2020-09-18 | Discharge: 2020-09-18 | Disposition: A | Discharge status: Home / Self Care | Payer: Medicare PPO | Source: Ambulatory Visit | Attending: Family Medicine | Admitting: Family Medicine

## 2020-09-18 ENCOUNTER — Other Ambulatory Visit: Payer: Self-pay

## 2020-09-18 DIAGNOSIS — Z1231 Encounter for screening mammogram for malignant neoplasm of breast: Secondary | ICD-10-CM

## 2020-09-26 ENCOUNTER — Other Ambulatory Visit: Payer: Self-pay

## 2020-09-26 ENCOUNTER — Other Ambulatory Visit (HOSPITAL_BASED_OUTPATIENT_CLINIC_OR_DEPARTMENT_OTHER): Payer: Self-pay

## 2020-09-26 ENCOUNTER — Ambulatory Visit: Payer: Medicare PPO | Attending: Internal Medicine

## 2020-09-26 DIAGNOSIS — Z23 Encounter for immunization: Secondary | ICD-10-CM

## 2020-09-26 MED ORDER — COVID-19 MRNA VAC-TRIS(PFIZER) 30 MCG/0.3ML IM SUSP
INTRAMUSCULAR | 0 refills | Status: DC
  Filled 2020-09-26: qty 0.3, 1d supply, fill #0

## 2020-09-26 NOTE — Progress Notes (Signed)
   Covid-19 Vaccination Clinic  Name:  [name]    MRN: [id number] DOB: [birth year]  09/26/2020  [name] was observed post Covid-19 immunization for 15 minutes without incident. She was provided with Vaccine Information Sheet and instruction to access the V-Safe system.   [name] was instructed to call 911 with any severe reactions post vaccine: Marland Kitchen Difficulty breathing  . Swelling of face and throat  . A fast heartbeat  . A bad rash all over body  . Dizziness and weakness   Immunizations Administered    Name Date Dose VIS Date Route   PFIZER Comrnaty(Gray TOP) Covid-19 Vaccine 09/26/2020 12:56 PM 0.3 mL 05/16/2020 Intramuscular   Manufacturer: [employer]   Lot: [id number]   NDC: [phone]

## 2020-09-30 ENCOUNTER — Ambulatory Visit: Payer: Medicare PPO

## 2020-11-15 ENCOUNTER — Telehealth: Payer: Self-pay | Admitting: Cardiovascular Disease

## 2020-11-15 NOTE — Telephone Encounter (Signed)
   Pt c/o medication issue:  1. Name of Medication: ELIQUIS 5 MG TABS tablet  2. How are you currently taking this medication (dosage and times per day)? TAKE 1 TABLET BY MOUTH 2 TIMES DAILY.  3. Are you having a reaction (difficulty breathing--STAT)?   4. What is your medication issue?  Pt said both of her hands is full of bruises, from her elbow to her wrist. She wasn't sure if she needs to lower her dosage of her blood thinner

## 2020-11-15 NOTE — Telephone Encounter (Signed)
Left message for patient to call back  

## 2020-11-18 NOTE — Telephone Encounter (Signed)
Left message for patient to call back  

## 2020-11-19 NOTE — Telephone Encounter (Signed)
RN returned call patient to advise of Dr.Nahser's advice. Patient verbalized understanding. RN encourged patient to contact the office with any questions or concerns.

## 2020-11-19 NOTE — Telephone Encounter (Signed)
    Pt is returning call, best phone# [phone]

## 2020-11-19 NOTE — Telephone Encounter (Signed)
RN returned call to patient. Patient states that she has began to notice significant bruising over her arms all the way to her fingers. Patient is doing physical therapy for her hand and states the kinesiology tape is leaving bruises on her hand as well. Patient was not sure if she needed to be seen or if Dr.Nahser had any recommendations. Rn advised I would speak to Dr. Acie Fredrickson and give her a call back with recommendations.

## 2020-11-23 ENCOUNTER — Other Ambulatory Visit: Payer: Self-pay | Admitting: Cardiovascular Disease

## 2020-11-25 NOTE — Telephone Encounter (Signed)
Pt last saw Dr Acie Fredrickson 07/09/20, last labs 06/06/20 Creat 0.77, age 75, weight 126.1kg, based on specified criteria pt is on appropriate dosage of Eliquis 5mg  BID.  Will refill rx.

## 2020-12-30 ENCOUNTER — Telehealth: Payer: Self-pay | Admitting: *Deleted

## 2020-12-30 DIAGNOSIS — I700 Atherosclerosis of aorta: Secondary | ICD-10-CM

## 2020-12-30 NOTE — Telephone Encounter (Addendum)
Clinical pharmacist to review Eliquis.   Not sure why she is on aspirin though. See previous clearance note from 06/15/2018 for a different procedure, at the time, Melina Copa questioned whether the patient actually need aspirin, Dr. Elmarie Shiley reply was "She may DC ASA from my standpoint"  Not sure if she has been placed on aspirin by other provider since, will need to check with the patient.

## 2020-12-30 NOTE — Telephone Encounter (Signed)
   [facility] Pre-operative Risk Assessment    Patient Name: [name]  DOB: April 22, 1946 MRN: [id number]  HEARTCARE STAFF:  - IMPORTANT!!!!!! Under Visit Info/Reason for Call, type in Other and utilize the format Clearance MM/DD/YY or Clearance TBD. Do not use dashes or single digits. - Please review there is not already an duplicate clearance open for this procedure. - If request is for dental extraction, please clarify the # of teeth to be extracted. - If the patient is currently at the dentist's office, call Pre-Op Callback Staff (MA/nurse) to input urgent request.  - If the patient is not currently in the dentist office, please route to the Pre-Op pool.  Request for surgical clearance:  What type of surgery is being performed? LEFT TOTAL HIP ARTHROPLASTY  When is this surgery scheduled? 01/28/21  What type of clearance is required (medical clearance vs. Pharmacy clearance to hold med vs. Both)? BOTH  Are there any medications that need to be held prior to surgery and how long?  ELIQUIS AND ASA  Practice name and name of physician performing surgery? [facility]; DR. Paralee Cancel  What is the office phone number? [phone]   7.   What is the office fax number? [place]  8.   Anesthesia type (None, local, MAC, general) ? SPINAL   Julaine Hua 12/30/2020, 4:11 PM  _________________________________________________________________   (provider comments below)

## 2020-12-31 DIAGNOSIS — I700 Atherosclerosis of aorta: Secondary | ICD-10-CM | POA: Insufficient documentation

## 2020-12-31 NOTE — Telephone Encounter (Signed)
Patient with diagnosis of afib on Eliquis for anticoagulation.    Procedure: left THA Date of procedure: 01/28/21  CHA2DS2-VASc Score = 5  This indicates a 7.2% annual risk of stroke. The patient's score is based upon: CHF History: No HTN History: Yes Diabetes History: Yes Stroke History: No Vascular Disease History: Yes Age Score: 1 Gender Score: 1   DVT 2021 while on Eliquis  CrCl 33m/min using adjusted body weight due to obesity Platelet count 246K  Typically hold Eliquis for 3 days prior to THA. From an afib perspective, pt is at acceptable risk. However, given her hx of DVT in 2021 while on Eliquis, will defer to MD for input.

## 2020-12-31 NOTE — Telephone Encounter (Signed)
Patient not available at time of the call.  Please call back.  [phone] on 12/31/2020.

## 2021-01-02 NOTE — Telephone Encounter (Signed)
Pt is returning a call  

## 2021-01-08 NOTE — Telephone Encounter (Signed)
Per Dr. Acie Fredrickson: Pt remains at moderate  risk for DVT and other cardiac complications  She may hold her Eliquis for 2 days and may hold ASA for 5 days prior to her hip surgery  She is adequately "tuned up" for her hip surgery   I attempted to reach out to the patient to give her Dr. Elmarie Shiley recommendation, however was unable to reach her.  I left her a message for her to call back and speak to the on-call preop APP of the day.

## 2021-01-09 NOTE — Telephone Encounter (Signed)
Patient's husband is returning call. He states the patient is out of town right now and she can be reached at [phone].

## 2021-01-13 NOTE — Telephone Encounter (Signed)
   Primary Cardiologist: Mertie Moores, MD  Chart reviewed as part of pre-operative protocol coverage. Given past medical history and time since last visit, based on ACC/AHA guidelines, [name] would be at acceptable risk for the planned procedure without further cardiovascular testing.   Patient with diagnosis of afib on Eliquis for anticoagulation.     Procedure: left THA Date of procedure: 01/28/21   CHA2DS2-VASc Score = 5  This indicates a 7.2% annual risk of stroke. The patient's score is based upon: CHF History: No HTN History: Yes Diabetes History: Yes Stroke History: No Vascular Disease History: Yes Age Score: 1 Gender Score: 1    DVT 2021 while on Eliquis   CrCl 55m/min using adjusted body weight due to obesity Platelet count 246K  Per Dr. NAcie Anthony Pt remains at moderate  risk for DVT and other cardiac complications  She may hold her Eliquis for 2 days and may hold ASA for 5 days prior to her hip surgery  She is adequately "tuned up" for her hip surgery   Patient was advised that if she develops new symptoms prior to surgery to contact our office to arrange a follow-up appointment.  She verbalized understanding.  I will route this recommendation to the requesting party via Epic fax function and remove from pre-op pool.  Please call with questions.  Robin Ng [name] NP-C    01/13/2021, 11:36 AM [email or web address] Office [phone] ([phone]

## 2021-01-13 NOTE — Telephone Encounter (Signed)
   Pt is returning call, she said if she can get a cb today since she will leave out of town again on Wednesday. Phone# [phone]

## 2021-01-14 ENCOUNTER — Other Ambulatory Visit: Payer: Self-pay | Admitting: Cardiovascular Disease

## 2021-01-17 NOTE — Patient Instructions (Addendum)
DUE TO COVID-19 ONLY ONE VISITOR IS ALLOWED TO COME WITH YOU AND STAY IN THE WAITING ROOM ONLY DURING PRE OP AND PROCEDURE.   **NO VISITORS ARE ALLOWED IN THE SHORT STAY AREA OR RECOVERY ROOM!!**  IF YOU WILL BE ADMITTED INTO THE HOSPITAL YOU ARE ALLOWED ONLY TWO SUPPORT PEOPLE DURING VISITATION HOURS ONLY (10AM -8PM)   The support person(s) may change daily. The support person(s) must pass our screening, gel in and out, and wear a mask at all times, including in the patient's room. Patients must also wear a mask when staff or their support person are in the room.  No visitors under the age of 75. Any visitor under the age of 52 must be accompanied by an adult.    COVID SWAB TESTING MUST BE COMPLETED ON:  Friday, 01-24-21 between the hours of 8 and 3  **MUST PRESENT COMPLETED FORM AT TESTING SITE**    [place] (backside of the building)  You are not required to quarantine, however you are required to wear a well-fitted mask when you are out and around people not in your household.  Hand Hygiene often Do NOT share personal items Notify your provider if you are in close contact with someone who has COVID or you develop fever 100.4 or greater, new onset of sneezing, cough, sore throat, shortness of breath or body aches.         Your procedure is scheduled on: Tuesday, 01-28-21   Report to [facility] Main  Entrance    Report to admitting at 9:00 AM   Call this number if you have problems the morning of surgery [phone]   Do not eat food :After Midnight.   May have liquids until 8:30 AM   day of surgery  CLEAR LIQUID DIET  Foods Allowed                                                                     Foods Excluded  Water, Black Coffee and tea, regular and decaf               liquids that you cannot  Plain Jell-O in any flavor  (No red)                                     see through such as: Fruit ices (not with fruit pulp)                                       milk, soups, orange juice              Iced Popsicles (No red)                                      All solid food  Apple juices Sports drinks like Gatorade (No red) Lightly seasoned clear broth or consume(fat free) Sugar, honey syrup   Complete one G2 drink the morning of surgery at  8:30 AM the day of surgery.       The day of surgery:  Drink ONE (1) G2 the morning of surgery. Drink in one sitting. Do not sip.  This drink was given to you during your hospital  pre-op appointment visit. Nothing else to drink after completing the G2.          If you have questions, please contact your surgeon's office.     Oral Hygiene is also important to reduce your risk of infection.                                    Remember - BRUSH YOUR TEETH THE MORNING OF SURGERY WITH YOUR REGULAR TOOTHPASTE   Do NOT smoke after Midnight   Take these medicines the morning of surgery with A SIP OF WATER: Diltiazem, Famotidine, Loratadine, Propranolol, Tramadol  DO NOT TAKE ANY ORAL DIABETIC MEDICATIONS DAY OF YOUR SURGERY  Hold Eliquis 2 days prior to surgery.    Hold Aspirin 81 mg 5 days prior to surgery.                              You may not have any metal on your body including hair pins, jewelry, and body piercing             Do not wear make-up, lotions, powders, perfumes or deodorant  Do not wear nail polish including gel and S&S, artificial/acrylic nails, or any other type of covering on natural nails including finger and toenails. If you have artificial nails, gel coating, etc. that needs to be removed by a nail salon please have this removed prior to surgery or surgery may need to be canceled/ delayed if the surgeon/ anesthesia feels like they are unable to be safely monitored.   Do not shave  48 hours prior to surgery.        Do not bring valuables to the hospital. [place].   Contacts, dentures  or bridgework may not be worn into surgery.   Bring small overnight bag day of surgery.    Please read over the following fact sheets you were given: IF YOU HAVE QUESTIONS ABOUT YOUR PRE OP INSTRUCTIONS PLEASE CALL [place] - Preparing for Surgery Before surgery, you can play an important role.  Because skin is not sterile, your skin needs to be as free of germs as possible.  You can reduce the number of germs on your skin by washing with CHG (chlorahexidine gluconate) soap before surgery.  CHG is an antiseptic cleaner which kills germs and bonds with the skin to continue killing germs even after washing. Please DO NOT use if you have an allergy to CHG or antibacterial soaps.  If your skin becomes reddened/irritated stop using the CHG and inform your nurse when you arrive at Short Stay. Do not shave (including legs and underarms) for at least 48 hours prior to the first CHG shower.  You may shave your face/neck.  Please follow these instructions carefully:  1.  Shower with CHG Soap the night before surgery and the  morning of surgery.  2.  If  you choose to wash your hair, wash your hair first as usual with your normal  shampoo.  3.  After you shampoo, rinse your hair and body thoroughly to remove the shampoo.                             4.  Use CHG as you would any other liquid soap.  You can apply chg directly to the skin and wash.  Gently with a scrungie or clean washcloth.  5.  Apply the CHG Soap to your body ONLY FROM THE NECK DOWN.   Do   not use on face/ open                           Wound or open sores. Avoid contact with eyes, ears mouth and   genitals (private parts).                       Wash face,  Genitals (private parts) with your normal soap.             6.  Wash thoroughly, paying special attention to the area where your    surgery  will be performed.  7.  Thoroughly rinse your body with warm water from the neck down.  8.  DO NOT shower/wash with your  normal soap after using and rinsing off the CHG Soap.                9.  Pat yourself dry with a clean towel.            10.  Wear clean pajamas.            11.  Place clean sheets on your bed the night of your first shower and do not  sleep with pets. Day of Surgery : Do not apply any lotions/deodorants the morning of surgery.  Please wear clean clothes to the hospital/surgery center.  FAILURE TO FOLLOW THESE INSTRUCTIONS MAY RESULT IN THE CANCELLATION OF YOUR SURGERY  PATIENT SIGNATURE_________________________________  NURSE SIGNATURE__________________________________  ________________________________________________________________________   Adam Phenix  An incentive spirometer is a tool that can help keep your lungs clear and active. This tool measures how well you are filling your lungs with each breath. Taking long deep breaths may help reverse or decrease the chance of developing breathing (pulmonary) problems (especially infection) following: A long period of time when you are unable to move or be active. BEFORE THE PROCEDURE  If the spirometer includes an indicator to show your best effort, your nurse or respiratory therapist will set it to a desired goal. If possible, sit up straight or lean slightly forward. Try not to slouch. Hold the incentive spirometer in an upright position. INSTRUCTIONS FOR USE  Sit on the edge of your bed if possible, or sit up as far as you can in bed or on a chair. Hold the incentive spirometer in an upright position. Breathe out normally. Place the mouthpiece in your mouth and seal your lips tightly around it. Breathe in slowly and as deeply as possible, raising the piston or the ball toward the top of the column. Hold your breath for 3-5 seconds or for as long as possible. Allow the piston or ball to fall to the bottom of the column. Remove the mouthpiece from your mouth and breathe out normally. Rest for a few seconds and repeat Steps 1  through 7 at least 10 times every 1-2 hours when you are awake. Take your time and take a few normal breaths between deep breaths. The spirometer may include an indicator to show your best effort. Use the indicator as a goal to work toward during each repetition. After each set of 10 deep breaths, practice coughing to be sure your lungs are clear. If you have an incision (the cut made at the time of surgery), support your incision when coughing by placing a pillow or rolled up towels firmly against it. Once you are able to get out of bed, walk around indoors and cough well. You may stop using the incentive spirometer when instructed by your caregiver.  RISKS AND COMPLICATIONS Take your time so you do not get dizzy or light-headed. If you are in pain, you may need to take or ask for pain medication before doing incentive spirometry. It is harder to take a deep breath if you are having pain. AFTER USE Rest and breathe slowly and easily. It can be helpful to keep track of a log of your progress. Your caregiver can provide you with a simple table to help with this. If you are using the spirometer at home, follow these instructions: [place] IF:  You are having difficultly using the spirometer. You have trouble using the spirometer as often as instructed. Your pain medication is not giving enough relief while using the spirometer. You develop fever of 100.5 F (38.1 C) or higher. SEEK IMMEDIATE MEDICAL CARE IF:  You cough up bloody sputum that had not been present before. You develop fever of 102 F (38.9 C) or greater. You develop worsening pain at or near the incision site. MAKE SURE YOU:  Understand these instructions. Will watch your condition. Will get help right away if you are not doing well or get worse. Document Released: 10/05/2006 Document Revised: 08/17/2011 Document Reviewed: 12/06/2006 ExitCare Patient Information 2014 ExitCare,  [place].   ________________________________________________________________________  WHAT IS A BLOOD TRANSFUSION? Blood Transfusion Information  A transfusion is the replacement of blood or some of its parts. Blood is made up of multiple cells which provide different functions. Red blood cells carry oxygen and are used for blood loss replacement. White blood cells fight against infection. Platelets control bleeding. Plasma helps clot blood. Other blood products are available for specialized needs, such as hemophilia or other clotting disorders. BEFORE THE TRANSFUSION  Who gives blood for transfusions?  Healthy volunteers who are fully evaluated to make sure their blood is safe. This is blood bank blood. Transfusion therapy is the safest it has ever been in the practice of medicine. Before blood is taken from a donor, a complete history is taken to make sure that person has no history of diseases nor engages in risky social behavior (examples are intravenous drug use or sexual activity with multiple partners). The donor's travel history is screened to minimize risk of transmitting infections, such as malaria. The donated blood is tested for signs of infectious diseases, such as HIV and hepatitis. The blood is then tested to be sure it is compatible with you in order to minimize the chance of a transfusion reaction. If you or a relative donates blood, this is often done in anticipation of surgery and is not appropriate for emergency situations. It takes many days to process the donated blood. RISKS AND COMPLICATIONS Although transfusion therapy is very safe and saves many lives, the main dangers of transfusion include:  Getting an infectious disease. Developing  a transfusion reaction. This is an allergic reaction to something in the blood you were given. Every precaution is taken to prevent this. The decision to have a blood transfusion has been considered carefully by your caregiver before blood is  given. Blood is not given unless the benefits outweigh the risks. AFTER THE TRANSFUSION Right after receiving a blood transfusion, you will usually feel much better and more energetic. This is especially true if your red blood cells have gotten low (anemic). The transfusion raises the level of the red blood cells which carry oxygen, and this usually causes an energy increase. The nurse administering the transfusion will monitor you carefully for complications. HOME CARE INSTRUCTIONS  No special instructions are needed after a transfusion. You may find your energy is better. Speak with your caregiver about any limitations on activity for underlying diseases you may have. SEEK MEDICAL CARE IF:  Your condition is not improving after your transfusion. You develop redness or irritation at the intravenous (IV) site. SEEK IMMEDIATE MEDICAL CARE IF:  Any of the following symptoms occur over the next 12 hours: Shaking chills. You have a temperature by mouth above 102 F (38.9 C), not controlled by medicine. Chest, back, or muscle pain. People around you feel you are not acting correctly or are confused. Shortness of breath or difficulty breathing. Dizziness and fainting. You get a rash or develop hives. You have a decrease in urine output. Your urine turns a dark color or changes to pink, red, or brown. Any of the following symptoms occur over the next 10 days: You have a temperature by mouth above 102 F (38.9 C), not controlled by medicine. Shortness of breath. Weakness after normal activity. The white part of the eye turns yellow (jaundice). You have a decrease in the amount of urine or are urinating less often. Your urine turns a dark color or changes to pink, red, or brown. Document Released: 05/22/2000 Document Revised: 08/17/2011 Document Reviewed: 01/09/2008 [employer] Patient Information 2014 [place], [place].  _______________________________________________________________________

## 2021-01-17 NOTE — Progress Notes (Addendum)
Covid swab appointment - 01-24-21  COVID Vaccine Completed: Yes x4 Date COVID Vaccine completed: Has received booster: Yes x2 COVID vaccine manufacturer: Pfizer      Date of COVID positive in last 90 days: No  PCP - Claris Gower, MD. Office note on chart Cardiologist - Mertie Moores, MD  Cardiac clearance in Epic dated 01-13-21 by Coletta Memos, NP-C  Chest x-ray - N/A EKG - 07-09-20 Epic Stress Test - 06-25-10 Epic ECHO - 07-06-18 Epic Cardiac Cath - greater than 10 years Pacemaker/ICD device last checked: Spinal Cord Stimulator:  Sleep Study - N/A CPAP -   Fasting Blood Sugar - N/A Checks Blood Sugar _____ times a day  Blood Thinner Instructions:  Eliquis 5 mg.  Hold for two day Aspirin Instructions: ASA 81 mg hold for 5 days Last Dose:  Activity level:  Can go up a flight of stairs and perform activities of daily living without stopping and without symptoms of chest pain or shortness of breath.     Anesthesia review:  Afib, murmur, HTN  Patient denies shortness of breath, fever, cough and chest pain at PAT appointment   Patient verbalized understanding of instructions that were given to them at the PAT appointment. Patient was also instructed that they will need to review over the PAT instructions again at home before surgery.

## 2021-01-20 ENCOUNTER — Encounter (HOSPITAL_COMMUNITY)
Admission: RE | Admit: 2021-01-20 | Discharge: 2021-01-20 | Disposition: A | Discharge status: Home / Self Care | Payer: Medicare PPO | Source: Ambulatory Visit | Attending: Orthopedic Surgery | Admitting: Orthopedic Surgery

## 2021-01-20 ENCOUNTER — Other Ambulatory Visit: Payer: Self-pay

## 2021-01-20 ENCOUNTER — Encounter (HOSPITAL_COMMUNITY): Payer: Self-pay

## 2021-01-20 DIAGNOSIS — Z7982 Long term (current) use of aspirin: Secondary | ICD-10-CM | POA: Diagnosis not present

## 2021-01-20 DIAGNOSIS — Z01812 Encounter for preprocedural laboratory examination: Secondary | ICD-10-CM | POA: Insufficient documentation

## 2021-01-20 DIAGNOSIS — I10 Essential (primary) hypertension: Secondary | ICD-10-CM | POA: Insufficient documentation

## 2021-01-20 DIAGNOSIS — M1612 Unilateral primary osteoarthritis, left hip: Secondary | ICD-10-CM | POA: Diagnosis not present

## 2021-01-20 DIAGNOSIS — R7303 Prediabetes: Secondary | ICD-10-CM | POA: Insufficient documentation

## 2021-01-20 DIAGNOSIS — Z7901 Long term (current) use of anticoagulants: Secondary | ICD-10-CM | POA: Insufficient documentation

## 2021-01-20 DIAGNOSIS — Z79899 Other long term (current) drug therapy: Secondary | ICD-10-CM | POA: Insufficient documentation

## 2021-01-20 DIAGNOSIS — I480 Paroxysmal atrial fibrillation: Secondary | ICD-10-CM | POA: Insufficient documentation

## 2021-01-20 HISTORY — DX: Headache, unspecified: R51.9

## 2021-01-20 HISTORY — DX: Methicillin resistant Staphylococcus aureus infection, unspecified site: A49.02

## 2021-01-20 HISTORY — DX: Gastro-esophageal reflux disease without esophagitis: K21.9

## 2021-01-20 HISTORY — DX: Prediabetes: R73.03

## 2021-01-20 LAB — CBC
HCT: 45.1 % (ref 36.0–46.0)
Hemoglobin: 14.7 g/dL (ref 12.0–15.0)
MCH: 31.7 pg (ref 26.0–34.0)
MCHC: 32.6 g/dL (ref 30.0–36.0)
MCV: 97.4 fL (ref 80.0–100.0)
Platelets: 225 10*3/uL (ref 150–400)
RBC: 4.63 MIL/uL (ref 3.87–5.11)
RDW: 13.2 % (ref 11.5–15.5)
WBC: 6.7 10*3/uL (ref 4.0–10.5)
nRBC: 0 % (ref 0.0–0.2)

## 2021-01-20 LAB — SURGICAL PCR SCREEN
MRSA, PCR: NEGATIVE
Staphylococcus aureus: NEGATIVE

## 2021-01-20 LAB — COMPREHENSIVE METABOLIC PANEL
ALT: 21 U/L (ref 0–44)
AST: 21 U/L (ref 15–41)
Albumin: 3.9 g/dL (ref 3.5–5.0)
Alkaline Phosphatase: 57 U/L (ref 38–126)
Anion gap: 9 (ref 5–15)
BUN: 26 mg/dL — ABNORMAL HIGH (ref 8–23)
CO2: 31 mmol/L (ref 22–32)
Calcium: 9.5 mg/dL (ref 8.9–10.3)
Chloride: 101 mmol/L (ref 98–111)
Creatinine, Ser: 0.84 mg/dL (ref 0.44–1.00)
GFR, Estimated: >60 mL/min (ref >60)
Glucose, Bld: 104 mg/dL — ABNORMAL HIGH (ref 70–99)
Potassium: 3.7 mmol/L (ref 3.5–5.1)
Sodium: 141 mmol/L (ref 135–145)
Total Bilirubin: 0.7 mg/dL (ref 0.3–1.2)
Total Protein: 6.4 g/dL — ABNORMAL LOW (ref 6.5–8.1)

## 2021-01-20 LAB — HEMOGLOBIN A1C
Hgb A1c MFr Bld: 5.6 % (ref 4.8–5.6)
Mean Plasma Glucose: 114.02 mg/dL

## 2021-01-21 NOTE — Progress Notes (Signed)
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   Pt is holding eliquis for only 2 days before surgery per cardiology clearance note  VS: BP (!) 144/70   Pulse 70   Temp 37.1 C (Oral)   Resp 18   Ht '5\' 8"'$  (1.727 m)   Wt 116.3 kg   LMP 07/22/2010   SpO2 100%   BMI 38.99 kg/m   PROVIDERS: - PCP is Leonard Downing, MD - Vascular surgeon is Servando Snare, MD. Last office visit 08/30/20 - Cardiologist is Mertie Moores, MD. Cleared for surgery 01/08/21 by Almyra Deforest, PA  LABS: Labs reviewed: Acceptable for surgery. (all labs ordered are listed, but only abnormal results are displayed)  Labs Reviewed  COMPREHENSIVE METABOLIC PANEL - Abnormal; Notable for the following components:      Result Value   Glucose, Bld 104 (*)    BUN 26 (*)    Total Protein 6.4 (*)    All other components within normal limits  SURGICAL PCR SCREEN  CBC  HEMOGLOBIN A1C  TYPE AND SCREEN    EKG 07/09/20: NSR.    CV: Echo 07/06/18:  1. The left ventricle appears to be normal in size, have normal wall thickness, with normal systolic function of 0000000. Echo evidence of normal diastolic filling patterns.   2. Possible small intra-atrial shunt.   3. Right ventricular systolic pressure is is normal.   4. The right ventricle is normal in size, has normal wall hickness and normal systolic function.   5. Normal left atrial size.   6. Normal right atrial size.   7. The mitral valve normal in structure and function.   8. Normal tricuspid valve.   9. Aortic valve tricuspid.  10. There is mild calcification of the aortic valve.  11.  There is mild thickening of the aortic valve.  12. The ascending aorta and aortic rootare normal is size and structure.    Past Medical History:  Diagnosis Date   Abnormal Pap smear    Rare ASCUS-H   Arthritis    Atrial fibrillation (HCC)    Cancer (HCC)    melanoma rt leg   Chest pain    Diverticulitis    DVT (deep venous thrombosis) (HCC)    GERD (gastroesophageal reflux disease)    Headache    HTN (hypertension) 07/02/2015   Hypercholesteremia    Hyperglycemia 02/04/2011   Hyperlipidemia 02/04/2011   Hypertension    per pt no htn but needs meds for other reason   Hypokalemia 05/17/2017   Injury of right popliteal artery 05/17/2017   Knee dislocation 05/17/2017   Leg edema 02/04/2011   Leg swelling 02/02/2011   Leukocytosis 05/17/2017   MRSA (methicillin resistant Staphylococcus aureus)    R leg   Obesity    PAF (paroxysmal atrial fibrillation) ([place]) 02/02/2011   Perforated sigmoid colon ([place])    from diverticulitis   Popliteal artery injury 05/17/2017   Pre-diabetes     Past Surgical History:  Procedure Laterality Date   ABDOMINAL AORTOGRAM W/LOWER EXTREMITY Bilateral 02/19/2020   Procedure: ABDOMINAL AORTOGRAM W/LOWER EXTREMITY;  Surgeon: Waynetta Sandy, MD;  Location: [place] [facility];  Service: Cardiovascular;  Laterality: Bilateral;  Rt.  lower  leg   ABDOMINAL HYSTERECTOMY     broken finger     BYPASS GRAFT POPLITEAL TO POPLITEAL Right 05/17/2017   Procedure: BYPASS GRAFT ABOVE KNEE POPLITEAL TO BELOW KNEE POPLITEAL USING NONREVERSED RIGHT GREATER SAPHENOUS VEIN;  Surgeon: Waynetta Sandy, MD;  Location: [facility];  Service: Vascular;  Laterality: Right;   CARDIAC CATHETERIZATION  07/11/2010   Est. EF of 60-65% -- Smooth and normal coronary arteries  -- Normal LV systolic function    CATARACT EXTRACTION, BILATERAL     08-12-17, 08-26-17   CHOLECYSTECTOMY     CYSTOCELE REPAIR  07/22/2010   Vault suspension, cystocele repair, graft and  cystoscopy   FINGER SURGERY     MELANOMA EXCISION     right leg 3/12   NASAL SINUS SURGERY     NEUROMA SURGERY     rt foot   TONSILLECTOMY     TOTAL KNEE ARTHROPLASTY  06/20/2009   left knee   TOTAL KNEE ARTHROPLASTY  12/27/2008   right knee   TOTAL VAGINAL HYSTERECTOMY  07/22/2010   with bilateral salpingo-oophorectomy by Dr. Joan Flores   VEIN HARVEST Right 05/17/2017   Procedure: RIGHT GREATER [place];  Surgeon: Waynetta Sandy, MD;  Location: [place];  Service: Vascular;  Laterality: Right;    MEDICATIONS:  acetaminophen (TYLENOL) 650 MG CR tablet   amoxicillin (AMOXIL) 500 MG capsule   Ascorbic Acid (VITAMIN C) 1000 MG tablet   aspirin EC 81 MG tablet   Calcium Carb-Cholecalciferol (CALCIUM + D3 PO)   conjugated estrogens (PREMARIN) vaginal cream   diltiazem (CARDIZEM CD) 240 MG 24 hr capsule   docusate sodium (COLACE) 100 MG capsule   ELIQUIS 5 MG TABS tablet   famotidine (PEPCID) 20 MG tablet   fluticasone (FLONASE) 50 MCG/ACT nasal spray   loratadine (CLARITIN) 10 MG tablet   Multiple Vitamin (MULTIVITAMIN WITH MINERALS) TABS tablet   PATADAY 0.2 % SOLN   potassium chloride SA (KLOR-CON) 20 MEQ tablet   propranolol (INDERAL) 10 MG tablet   psyllium (REGULOID) 0.52 g capsule   traMADol (ULTRAM) 50 MG tablet   triamterene-hydrochlorothiazide (MAXZIDE) 75-50 MG tablet   No current facility-administered medications for this encounter.   - Pt will hold eliquis x2 days   If no changes, I anticipate pt can proceed with surgery as scheduled.   Willeen Cass, PhD, FNP-BC [facility] Short Stay Surgical Center/Anesthesiology Phone: [phone] 01/21/2021 2:49 PM

## 2021-01-21 NOTE — Anesthesia Preprocedure Evaluation (Addendum)
Anesthesia Evaluation  Patient identified by MRN, date of birth, ID band Patient awake    Reviewed: Allergy & Precautions, NPO status , Patient's Chart, lab work & pertinent test results  History of Anesthesia Complications Negative for: history of anesthetic complications  Airway Mallampati: III  TM Distance: >3 FB Neck ROM: Full    Dental  (+) Dental Advisory Given   Pulmonary neg pulmonary ROS,    breath sounds clear to auscultation       Cardiovascular hypertension, Pt. on medications + dysrhythmias Atrial Fibrillation  Rhythm:Regular     Neuro/Psych  Headaches, negative psych ROS   GI/Hepatic Neg liver ROS, GERD  ,  Endo/Other  negative endocrine ROS  Renal/GU negative Renal ROS     Musculoskeletal  (+) Arthritis ,   Abdominal   Peds  Hematology eliquis    Anesthesia Other Findings   Reproductive/Obstetrics                            Anesthesia Physical Anesthesia Plan  ASA: 2  Anesthesia Plan: General   Post-op Pain Management:    Induction: Intravenous  PONV Risk Score and Plan: 3 and Ondansetron and Dexamethasone  Airway Management Planned: LMA and Oral ETT  Additional Equipment: None  Intra-op Plan:   Post-operative Plan: Extubation in OR  Informed Consent: I have reviewed the patients History and Physical, chart, labs and discussed the procedure including the risks, benefits and alternatives for the proposed anesthesia with the patient or authorized representative who has indicated his/her understanding and acceptance.     Dental advisory given  Plan Discussed with: CRNA and Anesthesiologist  Anesthesia Plan Comments: (See APP note by Durel Salts, FNP )       Anesthesia Quick Evaluation

## 2021-01-24 ENCOUNTER — Other Ambulatory Visit: Payer: Self-pay | Admitting: Orthopedic Surgery

## 2021-01-24 LAB — SARS CORONAVIRUS 2 (TAT 6-24 HRS): SARS Coronavirus 2: NEGATIVE

## 2021-01-27 NOTE — H&P (Signed)
TOTAL HIP ADMISSION H&P  Patient is admitted for left total hip arthroplasty.  Subjective:  Chief Complaint: left hip pain  HPI: [name], 75 y.o. female, has a history of pain and functional disability in the left hip(s) due to arthritis and patient has failed non-surgical conservative treatments for greater than 12 weeks to include NSAID's and/or analgesics and activity modification.  Onset of symptoms was gradual starting 2 years ago with gradually worsening course since that time.The patient noted no past surgery on the left hip(s).  Patient currently rates pain in the left hip at 8 out of 10 with activity. Patient has worsening of pain with activity and weight bearing and pain that interfers with activities of daily living. Patient has evidence of joint space narrowing by imaging studies. This condition presents safety issues increasing the risk of falls. There is no current active infection.  Patient Active Problem List   Diagnosis Date Noted   Aortic atherosclerosis ([place]) 12/31/2020   Closed fracture of base of proximal phalanx of finger 07/02/2020   Primary localized osteoarthrosis of ankle and foot 04/10/2020   Pain in left foot 04/10/2020   Colonic diverticular abscess 12/23/2018   Morbid obesity with body mass index (BMI) of 40.0 or higher ([place]) 12/11/2018   On continuous oral anticoagulation 12/11/2018   Atrial fibrillation ([place]) 12/11/2018   Perforated bowel ([place]) 12/10/2018   Pain in right foot 02/23/2018   Pain of left hip joint 08/11/2017   Stiffness of right knee 07/28/2017   S/P total knee replacement 07/02/2017   Trochanteric bursitis of left hip 07/02/2017   Knee dislocation 05/17/2017   Popliteal artery injury 05/17/2017   Hypokalemia 05/17/2017   Leukocytosis 05/17/2017   Injury of right popliteal artery 05/17/2017   HTN (hypertension) 07/02/2015   Hyperlipidemia 02/04/2011   Hyperglycemia 02/04/2011   Leg edema 02/04/2011   PAF (paroxysmal atrial  fibrillation) ([place]) 02/02/2011   Leg swelling 02/02/2011   Past Medical History:  Diagnosis Date   Abnormal Pap smear    Rare ASCUS-H   Arthritis    Atrial fibrillation ([place])    Cancer ([place])    melanoma rt leg   Chest pain    Diverticulitis    DVT (deep venous thrombosis) ([facility])    GERD (gastroesophageal reflux disease)    Headache    HTN (hypertension) 07/02/2015   Hypercholesteremia    Hyperglycemia 02/04/2011   Hyperlipidemia 02/04/2011   Hypertension    per pt no htn but needs meds for other reason   Hypokalemia 05/17/2017   Injury of right popliteal artery 05/17/2017   Knee dislocation 05/17/2017   Leg edema 02/04/2011   Leg swelling 02/02/2011   Leukocytosis 05/17/2017   MRSA (methicillin resistant Staphylococcus aureus)    R leg   Obesity    PAF (paroxysmal atrial fibrillation) ([place]) 02/02/2011   Perforated sigmoid colon ([place])    from diverticulitis   Popliteal artery injury 05/17/2017   Pre-diabetes     Past Surgical History:  Procedure Laterality Date   ABDOMINAL AORTOGRAM W/LOWER EXTREMITY Bilateral 02/19/2020   Procedure: ABDOMINAL AORTOGRAM W/LOWER EXTREMITY;  Surgeon: Waynetta Sandy, MD;  Location: [facility];  Service: Cardiovascular;  Laterality: Bilateral;  Rt.  lower  leg   ABDOMINAL HYSTERECTOMY     broken finger     BYPASS GRAFT POPLITEAL TO POPLITEAL Right 05/17/2017   Procedure: BYPASS GRAFT ABOVE KNEE POPLITEAL TO BELOW KNEE POPLITEAL USING NONREVERSED RIGHT GREATER SAPHENOUS VEIN;  Surgeon: Waynetta Sandy,  MD;  Location: [facility];  Service: Vascular;  Laterality: Right;   CARDIAC CATHETERIZATION  07/11/2010   Est. EF of 60-65% -- Smooth and normal coronary arteries  -- Normal LV systolic function    CATARACT EXTRACTION, BILATERAL     08-12-17, 08-26-17   CHOLECYSTECTOMY     CYSTOCELE REPAIR  07/22/2010   Vault suspension, cystocele repair, graft and cystoscopy   FINGER SURGERY     MELANOMA EXCISION     right leg 3/12    NASAL SINUS SURGERY     NEUROMA SURGERY     rt foot   TONSILLECTOMY     TOTAL KNEE ARTHROPLASTY  06/20/2009   left knee   TOTAL KNEE ARTHROPLASTY  12/27/2008   right knee   TOTAL VAGINAL HYSTERECTOMY  07/22/2010   with bilateral salpingo-oophorectomy by Dr. Joan Flores   VEIN HARVEST Right 05/17/2017   Procedure: [place];  Surgeon: Waynetta Sandy, MD;  Location: [facility];  Service: Vascular;  Laterality: Right;    No current facility-administered medications for this encounter.   Current Outpatient Medications  Medication Sig Dispense Refill Last Dose   acetaminophen (TYLENOL) 650 MG CR tablet Take 650 mg by mouth in the morning and at bedtime.      amoxicillin (AMOXIL) 500 MG capsule Take 2,000 mg by mouth See admin instructions. Take 2000 mg by mouth 1 hour prior to dental appointment      Ascorbic Acid (VITAMIN C) 1000 MG tablet Take 1,000 mg by mouth daily.      aspirin EC 81 MG tablet Take 81 mg by mouth daily.       Calcium Carb-Cholecalciferol (CALCIUM + D3 PO) Take 1 tablet by mouth daily.      conjugated estrogens (PREMARIN) vaginal cream 1/2 gram vaginally twice weekly (Patient taking differently: Place 1 Applicatorful vaginally daily as needed (bleeding related to dryness).) 30 g 1    diltiazem (CARDIZEM CD) 240 MG 24 hr capsule Take 1 capsule (240 mg total) by mouth daily. 90 capsule 3    docusate sodium (COLACE) 100 MG capsule Take 200 mg by mouth daily.      ELIQUIS 5 MG TABS tablet TAKE 1 TABLET BY MOUTH 2 TIMES DAILY. (Patient taking differently: Take 5 mg by mouth in the morning and at bedtime.) 60 tablet 5    famotidine (PEPCID) 20 MG tablet Take 20 mg by mouth daily.      fluticasone (FLONASE) 50 MCG/ACT nasal spray Place 1-2 sprays into both nostrils at bedtime as needed for allergies.      loratadine (CLARITIN) 10 MG tablet Take 10 mg by mouth daily.      Multiple Vitamin (MULTIVITAMIN WITH MINERALS) TABS tablet Take 1 tablet by  mouth daily.      PATADAY 0.2 % SOLN Place 1 drop into both eyes daily.   4    propranolol (INDERAL) 10 MG tablet Take 1 tablet (10 mg total) by mouth 4 (four) times daily as needed (palpitations or fast heart rate). 60 tablet 6    psyllium (REGULOID) 0.52 g capsule Take 2.6 g by mouth 4 (four) times daily.      traMADol (ULTRAM) 50 MG tablet Take 50 mg by mouth 2 (two) times daily.      triamterene-hydrochlorothiazide (MAXZIDE) 75-50 MG tablet Take 1 tablet by mouth daily.      potassium chloride SA (KLOR-CON) 20 MEQ tablet TAKE 1/2 TABLET (10 MEQ TOTAL) BY MOUTH DAILY. 45 tablet 3  Allergies  Allergen Reactions   Sulfa Antibiotics Other (See Comments)    Turned eyes yellow    Sulfonamide Derivatives Rash    Turned eyes yellow   Milk-Related Compounds Diarrhea   Atenolol Cough   Erythromycin Nausea Only   Plavix [Clopidogrel Bisulfate] Cough    Social History   Tobacco Use   Smoking status: Never   Smokeless tobacco: Never  Substance Use Topics   Alcohol use: No    Family History  Problem Relation Age of Onset   Emphysema Father    Atrial fibrillation Mother        has pacemaker   Diabetes Mother    Hypertension Mother    Other Brother        bulbar palsy     Review of Systems  Constitutional:  Negative for chills and fever.  Respiratory:  Negative for cough and shortness of breath.   Cardiovascular:  Negative for chest pain.  Gastrointestinal:  Negative for nausea and vomiting.  Musculoskeletal:  Positive for arthralgias.   Objective:  Physical Exam Well nourished and well developed. General: Alert and oriented x3, cooperative and pleasant, no acute distress. Head: normocephalic, atraumatic, neck supple. Eyes: EOMI.  Musculoskeletal:  Left hip exam: Significant antalgic/Trendelenburg gait Range of motion of her left hip was not assessed today based on prior examination as well as radiographic findings. Neurovascular intact distally without lower extremity  edema or erythema  Calves soft and nontender. Motor function intact in LE. Strength 5/5 LE bilaterally. Neuro: Distal pulses 2+. Sensation to light touch intact in LE.  Vital signs in last 24 hours:    Labs:   Estimated body mass index is 38.99 kg/m as calculated from the following:   Height as of 01/20/21: '5\' 8"'$  (1.727 m).   Weight as of 01/20/21: 116.3 kg.   Imaging Review Plain radiographs demonstrate severe degenerative joint disease of the left hip(s). The bone quality appears to be adequate for age and reported activity level.      Assessment/Plan:  End stage arthritis, left hip(s)  The patient history, physical examination, clinical judgement of the provider and imaging studies are consistent with end stage degenerative joint disease of the left hip(s) and total hip arthroplasty is deemed medically necessary. The treatment options including medical management, injection therapy, arthroscopy and arthroplasty were discussed at length. The risks and benefits of total hip arthroplasty were presented and reviewed. The risks due to aseptic loosening, infection, stiffness, dislocation/subluxation,  thromboembolic complications and other imponderables were discussed.  The patient acknowledged the explanation, agreed to proceed with the plan and consent was signed. Patient is being admitted for inpatient treatment for surgery, pain control, PT, OT, prophylactic antibiotics, VTE prophylaxis, progressive ambulation and ADL's and discharge planning.The patient is planning to be discharged  home.   Therapy Plans: HEP Disposition: Home with husband Planned DVT Prophylaxis: Eliquis 5 BID for a fib, on ASA 81 mg daily due to previous vascular surgery DME needed: none PCP: Dr. Arelia Sneddon  Cardiologist: Dr. Katharina Caper - clearance received Vascular surgeon - Dr. Donzetta Matters clearance received TXA: IV Allergies: atenolol - cough, erythromycin - nausea, sulfa - rash Anesthesia Concerns: none BMI:  40.0 Last HgbA1c: Not diabetic  Other: - Norco, robaxin - History of MRSA after right leg surgery  Griffith Citron, PA-C Orthopedic Surgery [facility] [phone]

## 2021-01-27 NOTE — Progress Notes (Signed)
Called patient about time change for sugery on 01/28/21. Patient to arrive 0730 for 1025 surgery. Complete G2 drink by 0700 . She verbalizes understanding.

## 2021-01-28 ENCOUNTER — Ambulatory Visit (HOSPITAL_COMMUNITY): Payer: Medicare PPO | Admitting: Certified Registered"

## 2021-01-28 ENCOUNTER — Ambulatory Visit (HOSPITAL_COMMUNITY): Payer: Medicare PPO | Admitting: Emergency Medicine

## 2021-01-28 ENCOUNTER — Ambulatory Visit (HOSPITAL_COMMUNITY): Payer: Medicare PPO

## 2021-01-28 ENCOUNTER — Observation Stay (HOSPITAL_COMMUNITY): Payer: Medicare PPO

## 2021-01-28 ENCOUNTER — Other Ambulatory Visit: Payer: Self-pay

## 2021-01-28 ENCOUNTER — Observation Stay (HOSPITAL_COMMUNITY)
Admission: RE | Admit: 2021-01-28 | Discharge: 2021-01-29 | Disposition: A | Discharge status: Home / Self Care | Payer: Medicare PPO | Attending: Orthopedic Surgery | Admitting: Orthopedic Surgery

## 2021-01-28 ENCOUNTER — Encounter (HOSPITAL_COMMUNITY): Payer: Self-pay | Admitting: Orthopedic Surgery

## 2021-01-28 ENCOUNTER — Encounter (HOSPITAL_COMMUNITY)
Admission: RE | Disposition: A | Discharge status: Home / Self Care | Payer: Self-pay | Source: Home / Self Care | Attending: Orthopedic Surgery

## 2021-01-28 DIAGNOSIS — Z419 Encounter for procedure for purposes other than remedying health state, unspecified: Secondary | ICD-10-CM

## 2021-01-28 DIAGNOSIS — I480 Paroxysmal atrial fibrillation: Secondary | ICD-10-CM | POA: Diagnosis not present

## 2021-01-28 DIAGNOSIS — Z7982 Long term (current) use of aspirin: Secondary | ICD-10-CM | POA: Insufficient documentation

## 2021-01-28 DIAGNOSIS — Z96649 Presence of unspecified artificial hip joint: Secondary | ICD-10-CM

## 2021-01-28 DIAGNOSIS — I10 Essential (primary) hypertension: Secondary | ICD-10-CM | POA: Diagnosis not present

## 2021-01-28 DIAGNOSIS — Z7901 Long term (current) use of anticoagulants: Secondary | ICD-10-CM | POA: Diagnosis not present

## 2021-01-28 DIAGNOSIS — Z79899 Other long term (current) drug therapy: Secondary | ICD-10-CM | POA: Diagnosis not present

## 2021-01-28 DIAGNOSIS — Z85820 Personal history of malignant melanoma of skin: Secondary | ICD-10-CM | POA: Diagnosis not present

## 2021-01-28 DIAGNOSIS — Z96653 Presence of artificial knee joint, bilateral: Secondary | ICD-10-CM | POA: Insufficient documentation

## 2021-01-28 DIAGNOSIS — R7303 Prediabetes: Secondary | ICD-10-CM | POA: Diagnosis not present

## 2021-01-28 DIAGNOSIS — M1612 Unilateral primary osteoarthritis, left hip: Principal | ICD-10-CM | POA: Insufficient documentation

## 2021-01-28 HISTORY — PX: TOTAL HIP ARTHROPLASTY: SHX124

## 2021-01-28 LAB — TYPE AND SCREEN
ABO/RH(D): A POS
Antibody Screen: NEGATIVE

## 2021-01-28 LAB — ABO/RH: ABO/RH(D): A POS

## 2021-01-28 SURGERY — ARTHROPLASTY, HIP, TOTAL, ANTERIOR APPROACH
Anesthesia: General | Site: Hip | Laterality: Left

## 2021-01-28 MED ORDER — CEFAZOLIN SODIUM-DEXTROSE 2-4 GM/100ML-% IV SOLN
2.0000 g | Freq: Four times a day (QID) | INTRAVENOUS | Status: AC
  Administered 2021-01-28 (×2): 2 g via INTRAVENOUS
  Filled 2021-01-28: qty 100

## 2021-01-28 MED ORDER — ACETAMINOPHEN 325 MG PO TABS: 325.0000 mg | ORAL_TABLET | Freq: Four times a day (QID) | ORAL | Status: DC | PRN

## 2021-01-28 MED ORDER — CHLORHEXIDINE GLUCONATE 0.12 % MT SOLN
15.0000 mL | Freq: Once | OROMUCOSAL | Status: AC
  Administered 2021-01-28: 15 mL via OROMUCOSAL

## 2021-01-28 MED ORDER — DEXAMETHASONE SODIUM PHOSPHATE 10 MG/ML IJ SOLN: 8.0000 mg | Freq: Once | INTRAMUSCULAR | Status: DC

## 2021-01-28 MED ORDER — FENTANYL CITRATE (PF) 100 MCG/2ML IJ SOLN
25.0000 ug | INTRAMUSCULAR | Status: DC | PRN
  Administered 2021-01-28 (×2): 50 ug via INTRAVENOUS

## 2021-01-28 MED ORDER — ROCURONIUM BROMIDE 10 MG/ML (PF) SYRINGE
PREFILLED_SYRINGE | INTRAVENOUS | Status: AC
  Filled 2021-01-28: qty 10

## 2021-01-28 MED ORDER — FERROUS SULFATE 325 (65 FE) MG PO TABS
325.0000 mg | ORAL_TABLET | Freq: Three times a day (TID) | ORAL | Status: DC
  Administered 2021-01-28 – 2021-01-29 (×3): 325 mg via ORAL
  Filled 2021-01-28 (×3): qty 1

## 2021-01-28 MED ORDER — MENTHOL 3 MG MT LOZG: 1.0000 | LOZENGE | OROMUCOSAL | Status: DC | PRN

## 2021-01-28 MED ORDER — DIPHENHYDRAMINE HCL 12.5 MG/5ML PO ELIX: 12.5000 mg | ORAL_SOLUTION | ORAL | Status: DC | PRN

## 2021-01-28 MED ORDER — TRIAMTERENE-HCTZ 75-50 MG PO TABS
1.0000 | ORAL_TABLET | Freq: Every day | ORAL | Status: DC
  Filled 2021-01-28: qty 1

## 2021-01-28 MED ORDER — LACTATED RINGERS IV SOLN: INTRAVENOUS | Status: DC

## 2021-01-28 MED ORDER — LORATADINE 10 MG PO TABS
10.0000 mg | ORAL_TABLET | Freq: Every day | ORAL | Status: DC
  Administered 2021-01-29: 10 mg via ORAL
  Filled 2021-01-28: qty 1

## 2021-01-28 MED ORDER — SUGAMMADEX SODIUM 200 MG/2ML IV SOLN
INTRAVENOUS | Status: DC | PRN
  Administered 2021-01-28: 200 mg via INTRAVENOUS

## 2021-01-28 MED ORDER — STERILE WATER FOR IRRIGATION IR SOLN
Status: DC | PRN
  Administered 2021-01-28: 2000 mL

## 2021-01-28 MED ORDER — ROCURONIUM BROMIDE 10 MG/ML (PF) SYRINGE
PREFILLED_SYRINGE | INTRAVENOUS | Status: DC | PRN
Start: 2021-01-28 — End: 2021-01-28
  Administered 2021-01-28: 70 mg via INTRAVENOUS

## 2021-01-28 MED ORDER — ONDANSETRON HCL 4 MG/2ML IJ SOLN
INTRAMUSCULAR | Status: DC | PRN
  Administered 2021-01-28: 4 mg via INTRAVENOUS

## 2021-01-28 MED ORDER — EPHEDRINE 5 MG/ML INJ
INTRAVENOUS | Status: AC
  Filled 2021-01-28: qty 5

## 2021-01-28 MED ORDER — HYDROCODONE-ACETAMINOPHEN 5-325 MG PO TABS
1.0000 | ORAL_TABLET | ORAL | Status: DC | PRN
  Administered 2021-01-28 – 2021-01-29 (×2): 2 via ORAL
  Filled 2021-01-28 (×2): qty 2

## 2021-01-28 MED ORDER — METOCLOPRAMIDE HCL 5 MG PO TABS: 5.0000 mg | ORAL_TABLET | Freq: Three times a day (TID) | ORAL | Status: DC | PRN

## 2021-01-28 MED ORDER — ACETAMINOPHEN 500 MG PO TABS: 1000.0000 mg | ORAL_TABLET | Freq: Once | ORAL | Status: DC | PRN

## 2021-01-28 MED ORDER — ONDANSETRON HCL 4 MG/2ML IJ SOLN: 4.0000 mg | Freq: Four times a day (QID) | INTRAMUSCULAR | Status: DC | PRN

## 2021-01-28 MED ORDER — POTASSIUM CHLORIDE CRYS ER 10 MEQ PO TBCR
10.0000 meq | EXTENDED_RELEASE_TABLET | Freq: Every day | ORAL | Status: DC
  Filled 2021-01-28: qty 1

## 2021-01-28 MED ORDER — FLUTICASONE PROPIONATE 50 MCG/ACT NA SUSP
1.0000 | Freq: Every evening | NASAL | Status: DC | PRN
  Filled 2021-01-28: qty 16

## 2021-01-28 MED ORDER — OLOPATADINE HCL 0.1 % OP SOLN
1.0000 [drp] | Freq: Two times a day (BID) | OPHTHALMIC | Status: DC
  Administered 2021-01-28 – 2021-01-29 (×2): 1 [drp] via OPHTHALMIC
  Filled 2021-01-28: qty 5

## 2021-01-28 MED ORDER — PSYLLIUM 95 % PO PACK
1.0000 | PACK | Freq: Three times a day (TID) | ORAL | Status: DC
  Filled 2021-01-28 (×3): qty 1

## 2021-01-28 MED ORDER — OXYCODONE HCL 5 MG/5ML PO SOLN
5.0000 mg | Freq: Once | ORAL | Status: DC | PRN
Start: 2021-01-28 — End: 2021-01-28

## 2021-01-28 MED ORDER — PHENOL 1.4 % MT LIQD: 1.0000 | OROMUCOSAL | Status: DC | PRN

## 2021-01-28 MED ORDER — ONDANSETRON HCL 4 MG/2ML IJ SOLN
INTRAMUSCULAR | Status: AC
  Filled 2021-01-28: qty 2

## 2021-01-28 MED ORDER — FENTANYL CITRATE (PF) 100 MCG/2ML IJ SOLN
INTRAMUSCULAR | Status: AC
  Filled 2021-01-28: qty 2

## 2021-01-28 MED ORDER — PROPOFOL 500 MG/50ML IV EMUL
INTRAVENOUS | Status: AC
  Filled 2021-01-28: qty 50

## 2021-01-28 MED ORDER — FENTANYL CITRATE (PF) 100 MCG/2ML IJ SOLN
INTRAMUSCULAR | Status: AC
  Administered 2021-01-28: 50 ug via INTRAVENOUS
  Filled 2021-01-28: qty 2

## 2021-01-28 MED ORDER — DOCUSATE SODIUM 100 MG PO CAPS
100.0000 mg | ORAL_CAPSULE | Freq: Two times a day (BID) | ORAL | Status: DC
  Administered 2021-01-28 – 2021-01-29 (×2): 100 mg via ORAL
  Filled 2021-01-28 (×2): qty 1

## 2021-01-28 MED ORDER — DEXAMETHASONE SODIUM PHOSPHATE 4 MG/ML IJ SOLN
INTRAMUSCULAR | Status: DC | PRN
  Administered 2021-01-28: 10 mg via INTRAVENOUS

## 2021-01-28 MED ORDER — APIXABAN 5 MG PO TABS
5.0000 mg | ORAL_TABLET | Freq: Two times a day (BID) | ORAL | Status: DC
  Administered 2021-01-29: 5 mg via ORAL
  Filled 2021-01-28: qty 1

## 2021-01-28 MED ORDER — ORAL CARE MOUTH RINSE: 15.0000 mL | Freq: Once | OROMUCOSAL | Status: AC

## 2021-01-28 MED ORDER — SODIUM CHLORIDE 0.9 % IV SOLN: INTRAVENOUS | Status: DC

## 2021-01-28 MED ORDER — ASPIRIN EC 81 MG PO TBEC
81.0000 mg | DELAYED_RELEASE_TABLET | Freq: Every day | ORAL | Status: DC
  Administered 2021-01-29: 81 mg via ORAL
  Filled 2021-01-28: qty 1

## 2021-01-28 MED ORDER — METHOCARBAMOL 500 MG IVPB - SIMPLE MED
500.0000 mg | Freq: Four times a day (QID) | INTRAVENOUS | Status: DC | PRN
  Filled 2021-01-28: qty 50

## 2021-01-28 MED ORDER — DEXAMETHASONE SODIUM PHOSPHATE 10 MG/ML IJ SOLN
INTRAMUSCULAR | Status: AC
  Filled 2021-01-28: qty 1

## 2021-01-28 MED ORDER — 0.9 % SODIUM CHLORIDE (POUR BTL) OPTIME
TOPICAL | Status: DC | PRN
  Administered 2021-01-28: 1000 mL

## 2021-01-28 MED ORDER — METHOCARBAMOL 500 MG PO TABS
500.0000 mg | ORAL_TABLET | Freq: Four times a day (QID) | ORAL | Status: DC | PRN
  Administered 2021-01-29: 500 mg via ORAL
  Filled 2021-01-28: qty 1

## 2021-01-28 MED ORDER — CEFAZOLIN SODIUM-DEXTROSE 2-4 GM/100ML-% IV SOLN
2.0000 g | INTRAVENOUS | Status: AC
  Administered 2021-01-28: 2 g via INTRAVENOUS
  Filled 2021-01-28: qty 100

## 2021-01-28 MED ORDER — DEXAMETHASONE SODIUM PHOSPHATE 10 MG/ML IJ SOLN
10.0000 mg | Freq: Once | INTRAMUSCULAR | Status: AC
  Administered 2021-01-29: 10 mg via INTRAVENOUS
  Filled 2021-01-28: qty 1

## 2021-01-28 MED ORDER — PSYLLIUM 0.52 G PO CAPS: 2.6000 g | ORAL_CAPSULE | Freq: Four times a day (QID) | ORAL | Status: DC

## 2021-01-28 MED ORDER — MORPHINE SULFATE (PF) 2 MG/ML IV SOLN
0.5000 mg | INTRAVENOUS | Status: DC | PRN
  Administered 2021-01-28 (×2): 1 mg via INTRAVENOUS
  Filled 2021-01-28 (×2): qty 1

## 2021-01-28 MED ORDER — DILTIAZEM HCL ER COATED BEADS 240 MG PO CP24
240.0000 mg | ORAL_CAPSULE | Freq: Every day | ORAL | Status: DC
  Administered 2021-01-29: 240 mg via ORAL
  Filled 2021-01-28: qty 1

## 2021-01-28 MED ORDER — TRANEXAMIC ACID-NACL 1000-0.7 MG/100ML-% IV SOLN
1000.0000 mg | INTRAVENOUS | Status: AC
  Administered 2021-01-28: 1000 mg via INTRAVENOUS
  Filled 2021-01-28: qty 100

## 2021-01-28 MED ORDER — PROPOFOL 10 MG/ML IV BOLUS
INTRAVENOUS | Status: DC | PRN
  Administered 2021-01-28: 160 mg via INTRAVENOUS

## 2021-01-28 MED ORDER — ONDANSETRON HCL 4 MG PO TABS: 4.0000 mg | ORAL_TABLET | Freq: Four times a day (QID) | ORAL | Status: DC | PRN

## 2021-01-28 MED ORDER — FAMOTIDINE 20 MG PO TABS
20.0000 mg | ORAL_TABLET | Freq: Every day | ORAL | Status: DC
  Administered 2021-01-29: 20 mg via ORAL
  Filled 2021-01-28: qty 1

## 2021-01-28 MED ORDER — LIDOCAINE 2% (20 MG/ML) 5 ML SYRINGE
INTRAMUSCULAR | Status: AC
  Filled 2021-01-28: qty 5

## 2021-01-28 MED ORDER — PROPRANOLOL HCL 10 MG PO TABS
10.0000 mg | ORAL_TABLET | Freq: Four times a day (QID) | ORAL | Status: DC | PRN
  Filled 2021-01-28: qty 1

## 2021-01-28 MED ORDER — HYDROCODONE-ACETAMINOPHEN 7.5-325 MG PO TABS
1.0000 | ORAL_TABLET | ORAL | Status: DC | PRN
  Administered 2021-01-28 – 2021-01-29 (×4): 2 via ORAL
  Filled 2021-01-28 (×4): qty 2

## 2021-01-28 MED ORDER — BISACODYL 10 MG RE SUPP: 10.0000 mg | Freq: Every day | RECTAL | Status: DC | PRN

## 2021-01-28 MED ORDER — ACETAMINOPHEN 10 MG/ML IV SOLN: 1000.0000 mg | Freq: Once | INTRAVENOUS | Status: DC | PRN

## 2021-01-28 MED ORDER — POLYETHYLENE GLYCOL 3350 17 G PO PACK: 17.0000 g | PACK | Freq: Every day | ORAL | Status: DC | PRN

## 2021-01-28 MED ORDER — TRANEXAMIC ACID-NACL 1000-0.7 MG/100ML-% IV SOLN
1000.0000 mg | Freq: Once | INTRAVENOUS | Status: AC
  Administered 2021-01-28: 1000 mg via INTRAVENOUS
  Filled 2021-01-28: qty 100

## 2021-01-28 MED ORDER — ACETAMINOPHEN 160 MG/5ML PO SOLN: 1000.0000 mg | Freq: Once | ORAL | Status: DC | PRN

## 2021-01-28 MED ORDER — EPHEDRINE SULFATE-NACL 50-0.9 MG/10ML-% IV SOSY
PREFILLED_SYRINGE | INTRAVENOUS | Status: DC | PRN
Start: 2021-01-28 — End: 2021-01-28
  Administered 2021-01-28: 5 mg via INTRAVENOUS
  Administered 2021-01-28: 10 mg via INTRAVENOUS
  Administered 2021-01-28: 5 mg via INTRAVENOUS

## 2021-01-28 MED ORDER — LIDOCAINE 2% (20 MG/ML) 5 ML SYRINGE
INTRAMUSCULAR | Status: DC | PRN
  Administered 2021-01-28: 60 mg via INTRAVENOUS

## 2021-01-28 MED ORDER — METHOCARBAMOL 500 MG IVPB - SIMPLE MED
INTRAVENOUS | Status: AC
  Administered 2021-01-28: 500 mg via INTRAVENOUS
  Filled 2021-01-28: qty 50

## 2021-01-28 MED ORDER — METOCLOPRAMIDE HCL 5 MG/ML IJ SOLN: 5.0000 mg | Freq: Three times a day (TID) | INTRAMUSCULAR | Status: DC | PRN

## 2021-01-28 MED ORDER — FENTANYL CITRATE (PF) 100 MCG/2ML IJ SOLN
INTRAMUSCULAR | Status: DC | PRN
  Administered 2021-01-28: 50 ug via INTRAVENOUS
  Administered 2021-01-28: 100 ug via INTRAVENOUS

## 2021-01-28 MED ORDER — OXYCODONE HCL 5 MG PO TABS: 5.0000 mg | ORAL_TABLET | Freq: Once | ORAL | Status: DC | PRN

## 2021-01-28 MED ORDER — POVIDONE-IODINE 10 % EX SWAB
2.0000 "application " | Freq: Once | CUTANEOUS | Status: AC
  Administered 2021-01-28: 2 via TOPICAL

## 2021-01-28 SURGICAL SUPPLY — 43 items
ADH SKN CLS APL DERMABOND .7 (GAUZE/BANDAGES/DRESSINGS) ×1
BAG COUNTER SPONGE SURGICOUNT (BAG) ×1 IMPLANT
BAG DECANTER FOR FLEXI CONT (MISCELLANEOUS) IMPLANT
BAG SPEC THK2 15X12 ZIP CLS (MISCELLANEOUS)
BAG SPNG CNTER NS LX DISP (BAG) ×1
BAG ZIPLOCK 12X15 (MISCELLANEOUS) IMPLANT
BLADE SAG 18X100X1.27 (BLADE) ×2 IMPLANT
COVER PERINEAL POST (MISCELLANEOUS) ×2 IMPLANT
COVER SURGICAL LIGHT HANDLE (MISCELLANEOUS) ×2 IMPLANT
CUP ACETBLR 52 OD PINNACLE (Hips) ×1 IMPLANT
DERMABOND ADVANCED (GAUZE/BANDAGES/DRESSINGS) ×1
DERMABOND ADVANCED .7 DNX12 (GAUZE/BANDAGES/DRESSINGS) ×1 IMPLANT
DRAPE FOOT SWITCH (DRAPES) ×2 IMPLANT
DRAPE STERI IOBAN 125X83 (DRAPES) ×2 IMPLANT
DRAPE U-SHAPE 47X51 STRL (DRAPES) ×4 IMPLANT
DRESSING AQUACEL AG SP 3.5X10 (GAUZE/BANDAGES/DRESSINGS) ×1 IMPLANT
DRSG AQUACEL AG SP 3.5X10 (GAUZE/BANDAGES/DRESSINGS) ×2
DRSG TEGADERM 2-3/8X2-3/4 SM (GAUZE/BANDAGES/DRESSINGS) ×1 IMPLANT
DURAPREP 26ML APPLICATOR (WOUND CARE) ×2 IMPLANT
ELECT REM PT RETURN 15FT ADLT (MISCELLANEOUS) ×2 IMPLANT
ELIMINATOR HOLE APEX DEPUY (Hips) ×1 IMPLANT
GLOVE SURG ENC MOIS LTX SZ6 (GLOVE) ×4 IMPLANT
GLOVE SURG UNDER LTX SZ7.5 (GLOVE) ×2 IMPLANT
GLOVE SURG UNDER POLY LF SZ6.5 (GLOVE) ×2 IMPLANT
GLOVE SURG UNDER POLY LF SZ7.5 (GLOVE) ×4 IMPLANT
GOWN STRL REUS W/TWL LRG LVL3 (GOWN DISPOSABLE) ×4 IMPLANT
HEAD CERAMIC DELTA 36 PLUS 1.5 (Hips) ×1 IMPLANT
HOLDER FOLEY CATH W/STRAP (MISCELLANEOUS) ×2 IMPLANT
KIT TURNOVER KIT A (KITS) ×2 IMPLANT
LINER NEUTRAL 52X36MM PLUS 4 (Liner) ×1 IMPLANT
PACK ANTERIOR HIP CUSTOM (KITS) ×2 IMPLANT
PENCIL SMOKE EVACUATOR (MISCELLANEOUS) ×1 IMPLANT
SCREW 6.5MMX30MM (Screw) ×1 IMPLANT
STEM FEMORAL SZ9 HIGH ACTIS (Stem) ×1 IMPLANT
SUT MNCRL AB 4-0 PS2 18 (SUTURE) ×2 IMPLANT
SUT STRATAFIX 0 PDS 27 VIOLET (SUTURE) ×2
SUT VIC AB 1 CT1 36 (SUTURE) ×6 IMPLANT
SUT VIC AB 2-0 CT1 27 (SUTURE) ×4
SUT VIC AB 2-0 CT1 TAPERPNT 27 (SUTURE) ×2 IMPLANT
SUTURE STRATFX 0 PDS 27 VIOLET (SUTURE) ×1 IMPLANT
TRAY FOLEY MTR SLVR 16FR STAT (SET/KITS/TRAYS/PACK) IMPLANT
TUBE SUCTION HIGH CAP CLEAR NV (SUCTIONS) ×2 IMPLANT
WATER STERILE IRR 1000ML POUR (IV SOLUTION) ×2 IMPLANT

## 2021-01-28 NOTE — Interval H&P Note (Signed)
History and Physical Interval Note:  01/28/2021 8:59 AM  [name]  has presented today for surgery, with the diagnosis of Left hip osteoarthritis.  The various methods of treatment have been discussed with the patient and family. After consideration of risks, benefits and other options for treatment, the patient has consented to  Procedure(s): TOTAL HIP ARTHROPLASTY ANTERIOR APPROACH (Left) as a surgical intervention.  The patient's history has been reviewed, patient examined, no change in status, stable for surgery.  I have reviewed the patient's chart and labs.  Questions were answered to the patient's satisfaction.     Mauri Pole

## 2021-01-28 NOTE — Evaluation (Signed)
Physical Therapy Evaluation Patient Details Name: [name] MRN: [id number] DOB: [birth year] Today's Date: 01/28/2021   History of Present Illness  Patient is 75 y.o. female s/p Lt THA anterior appraoch on 01/28/21 with PMH significant for OA, Afib, melanoma, DVT, GERD, HTN, HLD, obesity, Bil TKA.   Clinical Impression  Ashanta Jamiya Kellas is a 75 y.o. female POD 0 s/p Lt THA. Patient reports independence with mobility at baseline. Patient is now limited by functional impairments (see PT problem list below) and requires min-mod assist for bed mobility and transfers with RW. Patient was limited by pain, nausea, and dizziness with sit<>stand and unable to transfer to [facility] or recliner. Patient instructed in exercise to facilitate ROM and circulation to manage edema. Patient will benefit from continued skilled PT interventions to address impairments and progress towards PLOF. Acute PT will follow to progress mobility and stair training in preparation for safe discharge home.     Follow Up Recommendations Follow surgeon's recommendation for DC plan and follow-up therapies;Home health PT    Equipment Recommendations  None recommended by PT    Recommendations for Other Services       Precautions / Restrictions Precautions Precautions: Fall Restrictions Weight Bearing Restrictions: No Other Position/Activity Restrictions: WBAT      Mobility  Bed Mobility Overal bed mobility: Needs Assistance Bed Mobility: Supine to Sit;Sit to Supine     Supine to sit: HOB elevated;Mod assist Sit to supine: Mod assist   General bed mobility comments: verbal cues to reach for bed rail to sequecnce and assist to bring LE's off EOB. Mod Assist needed to raise trunk fully and scoot towards EOB. Pt able to complete lateral scoot along EOB to move to [facility]. Mod assist to bring LE's back and return to supine.    Transfers Overall transfer level: Needs assistance Equipment used: Rolling walker (2  wheeled) Transfers: Sit to/from Stand Sit to Stand: Min assist;From elevated surface         General transfer comment: pt requesting bed to be significant elevated; therapist encouraged attempt from slightly elevated bed and pt successful after 2 attempts. cues for hand placement to improve power up and assist to rise.  Ambulation/Gait                Stairs            Wheelchair Mobility    Modified Rankin (Stroke Patients Only)       Balance Overall balance assessment: Needs assistance Sitting-balance support: Feet supported Sitting balance-Leahy Scale: Good     Standing balance support: During functional activity;Bilateral upper extremity supported Standing balance-Leahy Scale: Fair                               Pertinent Vitals/Pain Pain Assessment: 0-10 Pain Score: 8  Pain Location: Lt hip Pain Descriptors / Indicators: Aching;Discomfort Pain Intervention(s): Limited activity within patient's tolerance;Monitored during session;Repositioned;Ice applied    Home Living Family/patient expects to be discharged to:: Private residence Living Arrangements: Spouse/significant other Available Help at Discharge: Family Type of Home: House Home Access: Ramped entrance     [place]: Environmental consultant - 2 wheels;Walker - 4 wheels;Cane - single point;Bedside commode;Shower seat      Prior Function Level of Independence: Independent         Comments: occasionally uses SPC     Hand Dominance   Dominant Hand: Right    Extremity/Trunk Assessment  Upper Extremity Assessment Upper Extremity Assessment: Overall WFL for tasks assessed    Lower Extremity Assessment Lower Extremity Assessment: Generalized weakness    Cervical / Trunk Assessment Cervical / Trunk Assessment: Normal  Communication   Communication: No difficulties  Cognition Arousal/Alertness: Awake/alert Behavior During Therapy: WFL for tasks  assessed/performed Overall Cognitive Status: Within Functional Limits for tasks assessed                                        General Comments      Exercises     Assessment/Plan    PT Assessment Patient needs continued PT services  PT Problem List Decreased strength;Decreased range of motion;Decreased activity tolerance;Decreased balance;Decreased mobility;Decreased safety awareness;Cardiopulmonary status limiting activity;Pain       PT Treatment Interventions DME instruction;Gait training;Stair training;Functional mobility training;Balance training;Therapeutic exercise;Therapeutic activities;Patient/family education    PT Goals (Current goals can be found in the Care Plan section)  Acute Rehab PT Goals Patient Stated Goal: stop hurting PT Goal Formulation: With patient Time For Goal Achievement: 02/04/21 Potential to Achieve Goals: Good    Frequency 7X/week   Barriers to discharge        Co-evaluation               AM-PAC PT "6 Clicks" Mobility  Outcome Measure Help needed turning from your back to your side while in a flat bed without using bedrails?: A Little Help needed moving from lying on your back to sitting on the side of a flat bed without using bedrails?: A Little Help needed moving to and from a bed to a chair (including a wheelchair)?: A Little Help needed standing up from a chair using your arms (e.g., wheelchair or bedside chair)?: A Little Help needed to walk in hospital room?: A Little Help needed climbing 3-5 steps with a railing? : A Little 6 Click Score: 18    End of Session Equipment Utilized During Treatment: Gait belt Activity Tolerance: Patient limited by pain;Treatment limited secondary to medical complications (Comment) (dizziness/nausea) Patient left: in bed;with call bell/phone within reach;with bed alarm set;with family/visitor present;with SCD's reapplied Nurse Communication: Mobility status PT Visit Diagnosis:  Muscle weakness (generalized) (M62.81);Difficulty in walking, not elsewhere classified (R26.2)    Time: [id number] PT Time Calculation (min) (ACUTE ONLY): 25 min   Charges:   PT Evaluation $PT Eval Low Complexity: 1 Low PT Treatments $Therapeutic Activity: 8-22 mins        Verner Mould, DPT Acute Rehabilitation Services Office [phone] Pager [phone]   Jacques Navy 01/28/2021, 5:30 PM

## 2021-01-28 NOTE — Transfer of Care (Signed)
Immediate Anesthesia Transfer of Care Note  Patient: [name]  Procedure(s) Performed: TOTAL HIP ARTHROPLASTY ANTERIOR APPROACH (Left: Hip)  Patient Location: PACU  Anesthesia Type:General  Level of Consciousness: awake, alert , oriented and patient cooperative  Airway & Oxygen Therapy: Patient Spontanous Breathing and aerosol face mask  Post-op Assessment: Report given to RN and Post -op Vital signs reviewed and stable  Post vital signs: Reviewed and stable  Last Vitals:  Vitals Value Taken Time  BP 112/49 01/28/21 1221  Temp    Pulse 69 01/28/21 1226  Resp 22 01/28/21 1226  SpO2 100 % 01/28/21 1226  Vitals shown include unvalidated device data.  Last Pain:  Vitals:   01/28/21 0825  PainSc: 1       Patients Stated Pain Goal: 4 ([phone] [id number])  Complications: No notable events documented.

## 2021-01-28 NOTE — Anesthesia Procedure Notes (Signed)
Procedure Name: Intubation Date/Time: 01/28/2021 10:30 AM Performed by: Claudia Desanctis, CRNA Pre-anesthesia Checklist: Patient identified, Emergency Drugs available, Suction available and Patient being monitored Patient Re-evaluated:Patient Re-evaluated prior to induction Oxygen Delivery Method: Circle system utilized Preoxygenation: Pre-oxygenation with 100% oxygen Induction Type: IV induction Ventilation: Mask ventilation without difficulty Laryngoscope Size: 2 and Miller Grade View: Grade I Tube type: Oral Number of attempts: 1 Airway Equipment and Method: Stylet Placement Confirmation: ETT inserted through vocal cords under direct vision, positive ETCO2 and breath sounds checked- equal and bilateral Tube secured with: Tape Dental Injury: Teeth and Oropharynx as per pre-operative assessment

## 2021-01-28 NOTE — Op Note (Signed)
NAME:  [name]                ACCOUNT NO.: [id number]      MEDICAL RECORD NO.: [id number]      FACILITY:  [facility]      PHYSICIAN:  Mauri Pole  DATE OF BIRTH:  [birth year]     DATE OF PROCEDURE:  01/28/2021                                 OPERATIVE REPORT         PREOPERATIVE DIAGNOSIS: Left  hip osteoarthritis.      POSTOPERATIVE DIAGNOSIS:  Left hip osteoarthritis.      PROCEDURE:  Left total hip replacement through an anterior approach   utilizing DePuy THR system, component size 52 pinnacle cup, a size 36+4 neutral   Altrex liner, a size 9 Hi Actis stem with a 36+1.5 delta ceramic   ball.      SURGEON:  Pietro Cassis. Alvan Dame, M.D.      ASSISTANT:  Griffith Citron, PA-C     ANESTHESIA:  Spinal.      SPECIMENS:  None.      COMPLICATIONS:  None.      BLOOD LOSS:  700 cc     DRAINS:  None.      INDICATION OF THE PROCEDURE:  [name] is a 74 y.o. female who had   presented to office for evaluation of left hip pain.  Radiographs revealed   progressive degenerative changes with bone-on-bone   articulation of the  hip joint, including subchondral cystic changes and osteophytes.  The patient had painful limited range of   motion significantly affecting their overall quality of life and function.  The patient was failing to    respond to conservative measures including medications and/or injections and activity modification and at this point was ready   to proceed with more definitive measures.  Consent was obtained for   benefit of pain relief.  Specific risks of infection, DVT, component   failure, dislocation, neurovascular injury, and need for revision surgery were reviewed in the office as well discussion of   the anterior versus posterior approach were reviewed.     PROCEDURE IN DETAIL:  The patient was brought to operative theater.   Once adequate anesthesia, preoperative antibiotics, 2 gm of Ancef, 1 gm of Tranexamic Acid, and  10 mg of Decadron were administered, the patient was positioned supine on the [employer] table.  Once the patient was safely positioned with adequate padding of boney prominences we predraped out the hip, and used fluoroscopy to confirm orientation of the pelvis.      The left hip was then prepped and draped from proximal iliac crest to   mid thigh with a shower curtain technique.      Time-out was performed identifying the patient, planned procedure, and the appropriate extremity.     An incision was then made 2 cm lateral to the   anterior superior iliac spine extending over the orientation of the   tensor fascia lata muscle and sharp dissection was carried down to the   fascia of the muscle.      The fascia was then incised.  The muscle belly was identified and swept   laterally and retractor placed along the superior neck.  Following   cauterization of the circumflex vessels and removing some pericapsular  fat, a second cobra retractor was placed on the inferior neck.  A T-capsulotomy was made along the line of the   superior neck to the trochanteric fossa, then extended proximally and   distally.  Tag sutures were placed and the retractors were then placed   intracapsular.  We then identified the trochanteric fossa and   orientation of my neck cut and then made a neck osteotomy with the femur on traction.  The femoral   head was removed without difficulty or complication.  Traction was let   off and retractors were placed posterior and anterior around the   acetabulum.      The labrum and foveal tissue were debrided.  I began reaming with a 44 mm   reamer and reamed up to 51 mm reamer with good bony bed preparation and a 52 mm  cup was chosen.  The final 52 mm Pinnacle cup was then impacted under fluoroscopy to confirm the depth of penetration and orientation with respect to   Abduction and forward flexion.  A screw was placed into the ilium followed by the hole eliminator.  The final    36+4 neutral Altrex liner was impacted with good visualized rim fit.  The cup was positioned anatomically within the acetabular portion of the pelvis.      At this point, the femur was rolled to 100 degrees.  Further capsule was   released off the inferior aspect of the femoral neck.  I then   released the superior capsule proximally.  With the leg in a neutral position the hook was placed laterally   along the femur under the vastus lateralis origin and elevated manually and then held in position using the hook attachment on the bed.  The leg was then extended and adducted with the leg rolled to 100   degrees of external rotation.  Retractors were placed along the medial calcar and posteriorly over the greater trochanter.  Once the proximal femur was fully   exposed, I used a box osteotome to set orientation.  I then began   broaching with the starting chili pepper broach and passed this by hand and then broached up to 9.  With the 9 broach in place I chose a high offset neck and did several trial reductions.  The offset was appropriate, leg lengths   appeared to be equal best matched with the +1.5 head ball trial confirmed radiographically.   Given these findings, I went ahead and dislocated the hip, repositioned all   retractors and positioned the right hip in the extended and abducted position.  The final 9 Hi Actis stem was   chosen and it was impacted down to the level of neck cut.  Based on this   and the trial reductions, a final 36+1.5 delta ceramic ball was chosen and   impacted onto a clean and dry trunnion, and the hip was reduced.  The   hip had been irrigated throughout the case again at this point.  I did   reapproximate the superior capsular leaflet to the anterior leaflet   using #1 Vicryl.  The fascia of the   tensor fascia lata muscle was then reapproximated using #1 Vicryl and #0 Stratafix sutures.  The   remaining wound was closed with 2-0 Vicryl and running 4-0 Monocryl.    The hip was cleaned, dried, and dressed sterilely using Dermabond and   Aquacel dressing.  The patient was then brought   to recovery room in  stable condition tolerating the procedure well.    Costella Hatcher, PA-C was present for the entirety of the case involved from   preoperative positioning, perioperative retractor management, general   facilitation of the case, as well as primary wound closure as assistant.            Pietro Cassis Alvan Dame, M.D.        01/28/2021 10:30 AM

## 2021-01-29 DIAGNOSIS — M1612 Unilateral primary osteoarthritis, left hip: Secondary | ICD-10-CM | POA: Diagnosis not present

## 2021-01-29 LAB — BASIC METABOLIC PANEL
Anion gap: 6 (ref 5–15)
BUN: 18 mg/dL (ref 8–23)
CO2: 28 mmol/L (ref 22–32)
Calcium: 8.9 mg/dL (ref 8.9–10.3)
Chloride: 105 mmol/L (ref 98–111)
Creatinine, Ser: 0.83 mg/dL (ref 0.44–1.00)
GFR, Estimated: >60 mL/min (ref >60)
Glucose, Bld: 166 mg/dL — ABNORMAL HIGH (ref 70–99)
Potassium: 3.8 mmol/L (ref 3.5–5.1)
Sodium: 139 mmol/L (ref 135–145)

## 2021-01-29 LAB — CBC
HCT: 39.2 % (ref 36.0–46.0)
Hemoglobin: 12.8 g/dL (ref 12.0–15.0)
MCH: 31.5 pg (ref 26.0–34.0)
MCHC: 32.7 g/dL (ref 30.0–36.0)
MCV: 96.6 fL (ref 80.0–100.0)
Platelets: 214 10*3/uL (ref 150–400)
RBC: 4.06 MIL/uL (ref 3.87–5.11)
RDW: 13.2 % (ref 11.5–15.5)
WBC: 13.3 10*3/uL — ABNORMAL HIGH (ref 4.0–10.5)
nRBC: 0 % (ref 0.0–0.2)

## 2021-01-29 MED ORDER — POLYETHYLENE GLYCOL 3350 17 G PO PACK: 17.0000 g | PACK | Freq: Every day | ORAL | 0 refills | Status: DC | PRN

## 2021-01-29 MED ORDER — DOXYCYCLINE HYCLATE 50 MG PO CAPS: 50.0000 mg | ORAL_CAPSULE | Freq: Two times a day (BID) | ORAL | 0 refills | Status: DC

## 2021-01-29 MED ORDER — METHOCARBAMOL 500 MG PO TABS: 500.0000 mg | ORAL_TABLET | Freq: Four times a day (QID) | ORAL | 0 refills | Status: DC | PRN

## 2021-01-29 MED ORDER — DOXYCYCLINE HYCLATE 50 MG PO CAPS: 100.0000 mg | ORAL_CAPSULE | Freq: Two times a day (BID) | ORAL | 0 refills | Status: AC

## 2021-01-29 MED ORDER — DOCUSATE SODIUM 100 MG PO CAPS: 100.0000 mg | ORAL_CAPSULE | Freq: Two times a day (BID) | ORAL | 0 refills | Status: AC

## 2021-01-29 MED ORDER — HYDROCODONE-ACETAMINOPHEN 5-325 MG PO TABS: 1.0000 | ORAL_TABLET | Freq: Four times a day (QID) | ORAL | 0 refills | Status: DC | PRN

## 2021-01-29 NOTE — Progress Notes (Signed)
RN reviewed discharge instructions with patient and family. All questions answered.   Paperwork given. Prescriptions electronically sent to patient pharmacy.    NT rolled patient down with all belongings to family car.     Maahir Horst, RN  

## 2021-01-29 NOTE — Progress Notes (Signed)
Physical Therapy Treatment Patient Details Name: [name] MRN: [id number] DOB: 1946-05-10 Today's Date: 01/29/2021    History of Present Illness Patient is 75 y.o. female s/p Lt THA anterior appraoch on 01/28/21 with PMH significant for OA, Afib, melanoma, DVT, GERD, HTN, HLD, obesity, Bil TKA.    PT Comments    Pt is progressing well with mobility, she ambulated 130' with RW without loss of balance. Initiated HEP. Will plan to do stair training this afternoon, then I expect she'll be ready to DC home.     Follow Up Recommendations  Follow surgeon's recommendation for DC plan and follow-up therapies;Home health PT     Equipment Recommendations  None recommended by PT    Recommendations for Other Services       Precautions / Restrictions Precautions Precautions: Fall Restrictions Weight Bearing Restrictions: No Other Position/Activity Restrictions: WBAT    Mobility  Bed Mobility Overal bed mobility: Needs Assistance Bed Mobility: Supine to Sit     Supine to sit: Mod assist     General bed mobility comments: mod A to raise trunk and pivot to EOB, used gait belt as LLE lifter    Transfers Overall transfer level: Needs assistance Equipment used: Rolling walker (2 wheeled) Transfers: Sit to/from Stand Sit to Stand: Min assist;From elevated surface         General transfer comment: VCs hand placement, min A to power up  Ambulation/Gait Ambulation/Gait assistance: Min guard Gait Distance (Feet): 130 Feet Assistive device: Rolling walker (2 wheeled) Gait Pattern/deviations: Step-to pattern;Decreased stride length Gait velocity: decr   General Gait Details: VCs for sequencing and step length, 6/10 L hip pain while walking   Stairs             Wheelchair Mobility    Modified Rankin (Stroke Patients Only)       Balance Overall balance assessment: Needs assistance Sitting-balance support: Feet supported Sitting balance-Leahy Scale: Good      Standing balance support: During functional activity;Bilateral upper extremity supported Standing balance-Leahy Scale: Fair                              Cognition Arousal/Alertness: Awake/alert Behavior During Therapy: WFL for tasks assessed/performed Overall Cognitive Status: Within Functional Limits for tasks assessed                                        Exercises Total Joint Exercises Ankle Circles/Pumps: AROM;Both;10 reps;Supine Quad Sets: AROM;Left;5 reps;Supine Heel Slides: AAROM;Left;10 reps;Supine Hip ABduction/ADduction: AAROM;Left;10 reps;Supine    General Comments        Pertinent Vitals/Pain Pain Score: 6  Pain Location: Lt hip Pain Descriptors / Indicators: Aching;Discomfort Pain Intervention(s): Limited activity within patient's tolerance;Monitored during session;Premedicated before session;Ice applied    Home Living                      Prior Function            PT Goals (current goals can now be found in the care plan section) Acute Rehab PT Goals Patient Stated Goal: build a treehouse with granddaughter PT Goal Formulation: With patient Time For Goal Achievement: 02/04/21 Potential to Achieve Goals: Good Progress towards PT goals: Progressing toward goals    Frequency    7X/week      PT Plan  Co-evaluation              AM-PAC PT "6 Clicks" Mobility   Outcome Measure  Help needed turning from your back to your side while in a flat bed without using bedrails?: A Little Help needed moving from lying on your back to sitting on the side of a flat bed without using bedrails?: A Little Help needed moving to and from a bed to a chair (including a wheelchair)?: A Little Help needed standing up from a chair using your arms (e.g., wheelchair or bedside chair)?: A Little Help needed to walk in hospital room?: A Little Help needed climbing 3-5 steps with a railing? : A Little 6 Click Score: 18     End of Session Equipment Utilized During Treatment: Gait belt Activity Tolerance: Patient tolerated treatment well (dizziness/nausea) Patient left: with call bell/phone within reach;in chair;with chair alarm set Nurse Communication: Mobility status PT Visit Diagnosis: Muscle weakness (generalized) (M62.81);Difficulty in walking, not elsewhere classified (R26.2)     Time: [id number] PT Time Calculation (min) (ACUTE ONLY): 31 min  Charges:  $Gait Training: 8-22 mins $Therapeutic Exercise: 8-22 mins                     Blondell Reveal Kistler PT 01/29/2021  Acute Rehabilitation Services Pager [phone] Office [phone]

## 2021-01-29 NOTE — Plan of Care (Signed)
  Problem: Activity: Goal: Risk for activity intolerance will decrease Outcome: Progressing   Problem: Elimination: Goal: Will not experience complications related to bowel motility Outcome: Progressing   Problem: Elimination: Goal: Will not experience complications related to urinary retention Outcome: Progressing   Problem: Pain Managment: Goal: General experience of comfort will improve Outcome: Progressing   Problem: Safety: Goal: Ability to remain free from injury will improve Outcome: Progressing

## 2021-01-29 NOTE — Progress Notes (Signed)
Physical Therapy Treatment Patient Details Name: [name] MRN: [id number] DOB: 1946/02/03 Today's Date: 01/29/2021    History of Present Illness Patient is 75 y.o. female s/p Lt THA anterior appraoch on 01/28/21 with PMH significant for OA, Afib, melanoma, DVT, GERD, HTN, HLD, obesity, Bil TKA.    PT Comments    Pt is progressing well with mobility and is ready to DC home from a PT standpoint. Stair training completed this session. Pt demonstrates good understanding of HEP.     Follow Up Recommendations  Follow surgeon's recommendation for DC plan and follow-up therapies;Home health PT     Equipment Recommendations  None recommended by PT    Recommendations for Other Services       Precautions / Restrictions Precautions Precautions: Fall Restrictions Weight Bearing Restrictions: No Other Position/Activity Restrictions: WBAT    Mobility  Bed Mobility Overal bed mobility: Needs Assistance Bed Mobility: Supine to Sit;Sit to Supine     Supine to sit: Min assist Sit to supine: Min assist   General bed mobility comments: min A to raise trunk and pivot to EOB, used gait belt as LLE lifter    Transfers Overall transfer level: Needs assistance Equipment used: Rolling walker (2 wheeled) Transfers: Sit to/from Stand Sit to Stand: From elevated surface;Min guard         General transfer comment: VCs hand placement, min A to power up  Ambulation/Gait Ambulation/Gait assistance: Supervision Gait Distance (Feet): 130 Feet Assistive device: Rolling walker (2 wheeled) Gait Pattern/deviations: Step-to pattern;Decreased stride length Gait velocity: decr   General Gait Details: VCs for sequencing and step length, 6/10 L hip pain while walking   Stairs Stairs: Yes   Stair Management: Backwards;Step to pattern;No rails Number of Stairs: 2 General stair comments: husband present, VCs sequencing   Wheelchair Mobility    Modified Rankin (Stroke Patients  Only)       Balance Overall balance assessment: Needs assistance Sitting-balance support: Feet supported Sitting balance-Leahy Scale: Good     Standing balance support: During functional activity;Bilateral upper extremity supported Standing balance-Leahy Scale: Fair                              Cognition Arousal/Alertness: Awake/alert Behavior During Therapy: WFL for tasks assessed/performed Overall Cognitive Status: Within Functional Limits for tasks assessed                                        Exercises Total Joint Exercises Ankle Circles/Pumps: AROM;Both;10 reps;Supine Quad Sets: AROM;Left;5 reps;Supine Short Arc Quad: AROM;Left;10 reps;Supine Heel Slides: AAROM;Left;10 reps;Supine Hip ABduction/ADduction: AAROM;Left;10 reps;Supine Long Arc Quad: AROM;Left;10 reps;Seated Knee Flexion: AAROM;Left;10 reps;Seated    General Comments        Pertinent Vitals/Pain Pain Score: 6  Pain Location: Lt hip Pain Descriptors / Indicators: Aching;Discomfort Pain Intervention(s): Limited activity within patient's tolerance;Monitored during session;Premedicated before session;Ice applied    Home Living                      Prior Function            PT Goals (current goals can now be found in the care plan section) Acute Rehab PT Goals Patient Stated Goal: build a treehouse with granddaughter PT Goal Formulation: With patient Time For Goal Achievement: 02/04/21 Potential to Achieve Goals: Good Progress towards PT goals:  Progressing toward goals    Frequency    7X/week      PT Plan Current plan remains appropriate    Co-evaluation              AM-PAC PT "6 Clicks" Mobility   Outcome Measure  Help needed turning from your back to your side while in a flat bed without using bedrails?: A Little Help needed moving from lying on your back to sitting on the side of a flat bed without using bedrails?: A Little Help needed  moving to and from a bed to a chair (including a wheelchair)?: None Help needed standing up from a chair using your arms (e.g., wheelchair or bedside chair)?: None Help needed to walk in hospital room?: None Help needed climbing 3-5 steps with a railing? : A Little 6 Click Score: 21    End of Session Equipment Utilized During Treatment: Gait belt Activity Tolerance: Patient tolerated treatment well (dizziness/nausea) Patient left: with call bell/phone within reach;in bed;with bed alarm set Nurse Communication: Mobility status PT Visit Diagnosis: Muscle weakness (generalized) (M62.81);Difficulty in walking, not elsewhere classified (R26.2)     Time: [id number] PT Time Calculation (min) (ACUTE ONLY): 25 min  Charges:  $Gait Training: 8-22 mins $Therapeutic Exercise: 8-22 mins                     Blondell Reveal Kistler PT 01/29/2021  Acute Rehabilitation Services Pager [phone] Office [phone]

## 2021-01-29 NOTE — Progress Notes (Addendum)
Subjective: 1 Day Post-Op Procedure(s) (LRB): TOTAL HIP ARTHROPLASTY ANTERIOR APPROACH (Left) Patient reports pain as mild.   Patient seen in rounds for Dr. Alvan Dame. Patient is well, and has had no acute complaints or problems overnight. Voiding without difficulty. We will start therapy today.   Objective: Vital signs in last 24 hours: Temp:  [97.5 F (36.4 C)-99.3 F (37.4 C)] 97.9 F (36.6 C) (08/24 0512) Pulse Rate:  [62-93] 70 (08/24 0512) Resp:  [10-22] 17 (08/24 0512) BP: (111-134)/(49-80) 120/59 (08/24 0512) SpO2:  [94 %-100 %] 95 % (08/24 0512) Weight:  [116.3 kg] 116.3 kg (08/23 1500)  Intake/Output from previous day:  Intake/Output Summary (Last 24 hours) at 01/29/2021 0824 Last data filed at 01/29/2021 [id number] Gross per 24 hour  Intake 3960.51 ml  Output 1800 ml  Net 2160.51 ml     Intake/Output this shift: No intake/output data recorded.  Labs: Recent Labs    01/29/21 0341  HGB 12.8   Recent Labs    01/29/21 0341  WBC 13.3*  RBC 4.06  HCT 39.2  PLT 214   Recent Labs    01/29/21 0341  NA 139  K 3.8  CL 105  CO2 28  BUN 18  CREATININE 0.83  GLUCOSE 166*  CALCIUM 8.9   No results for input(s): LABPT, INR in the last 72 hours.  Exam: General - Patient is Alert and Oriented Extremity - Neurologically intact Sensation intact distally Intact pulses distally Dorsiflexion/Plantar flexion intact Dressing - dressing C/D/I Motor Function - intact, moving foot and toes well on exam.   Past Medical History:  Diagnosis Date   Abnormal Pap smear    Rare ASCUS-H   Arthritis    Atrial fibrillation (HCC)    Cancer (HCC)    melanoma rt leg   Chest pain    Diverticulitis    DVT (deep venous thrombosis) (HCC)    GERD (gastroesophageal reflux disease)    Headache    HTN (hypertension) 07/02/2015   Hypercholesteremia    Hyperglycemia 02/04/2011   Hyperlipidemia 02/04/2011   Hypertension    per pt no htn but needs meds for other reason    Hypokalemia 05/17/2017   Injury of right popliteal artery 05/17/2017   Knee dislocation 05/17/2017   Leg edema 02/04/2011   Leg swelling 02/02/2011   Leukocytosis 05/17/2017   MRSA (methicillin resistant Staphylococcus aureus)    R leg   Obesity    PAF (paroxysmal atrial fibrillation) ([place]) 02/02/2011   Perforated sigmoid colon ([place])    from diverticulitis   Popliteal artery injury 05/17/2017   Pre-diabetes     Assessment/Plan: 1 Day Post-Op Procedure(s) (LRB): TOTAL HIP ARTHROPLASTY ANTERIOR APPROACH (Left) Active Problems:   S/P left total hip arthroplasty  Estimated body mass index is 38.98 kg/m as calculated from the following:   Height as of this encounter: '5\' 8"'$  (1.727 m).   Weight as of this encounter: 116.3 kg. Advance diet Up with therapy D/C IV fluids  DVT Prophylaxis -  Eliquis Weight bearing as tolerated.  Plan is to go Home after hospital stay. Plan to discharge home after 1-2 sessions of therapy as long as she is meeting her goals. I discussed with her RN that she may change her aquacel prior to discharge if pulling up. Follow up in the office in 2 weeks.   We will send her home with doxycycline 100 BID for 7 days as a prophylactic due to history of MRSA infection with a previous orthopaedic surgery.  Griffith Citron, PA-C Orthopedic Surgery [phone] 01/29/2021, 8:24 AM

## 2021-01-29 NOTE — TOC Transition Note (Signed)
Transition of Care [facility]) - CM/SW Discharge Note  Patient Details  Name: [name] MRN: [id number] Date of Birth: Apr 28, 1946  [facility] CM/SW Contact:  Sherie Don, LCSW Phone Number: 01/29/2021, 10:29 AM  Clinical Narrative: Patient is expected to discharge after working with PT. CSW met with patient to review discharge plan. Patient will discharge home with a home exercise program (HEP). Patient has a rolling walker, can, 3N1, and high toilets at home so there are no DME needs at this time. TOC signing off.  Final next level of care: Home/Self Care Barriers to Discharge: No Barriers Identified  Patient Goals and CMS Choice Patient states their goals for this hospitalization and ongoing recovery are:: Discharge home with HEP CMS Medicare.gov Compare Post Acute Care list provided to:: Patient Choice offered to / list presented to : NA  Discharge Plan and Services        DME Arranged: N/A DME Agency: NA  Readmission Risk Interventions No flowsheet data found.

## 2021-01-30 ENCOUNTER — Encounter (HOSPITAL_COMMUNITY): Payer: Self-pay | Admitting: Orthopedic Surgery

## 2021-02-03 NOTE — Discharge Summary (Signed)
Physician Discharge Summary   Patient ID: [name] MRN: [id number] DOB/AGE: [birth year] 75 y.o.  Admit date: 01/28/2021 Discharge date: 01/29/2021  Primary Diagnosis: Left  hip osteoarthritis.   Admission Diagnoses:  Past Medical History:  Diagnosis Date   Abnormal Pap smear    Rare ASCUS-H   Arthritis    Atrial fibrillation ([place])    Cancer ([place])    melanoma rt leg   Chest pain    Diverticulitis    DVT (deep venous thrombosis) (HCC)    GERD (gastroesophageal reflux disease)    Headache    HTN (hypertension) 07/02/2015   Hypercholesteremia    Hyperglycemia 02/04/2011   Hyperlipidemia 02/04/2011   Hypertension    per pt no htn but needs meds for other reason   Hypokalemia 05/17/2017   Injury of right popliteal artery 05/17/2017   Knee dislocation 05/17/2017   Leg edema 02/04/2011   Leg swelling 02/02/2011   Leukocytosis 05/17/2017   MRSA (methicillin resistant Staphylococcus aureus)    R leg   Obesity    PAF (paroxysmal atrial fibrillation) ([place]) 02/02/2011   Perforated sigmoid colon ([place])    from diverticulitis   Popliteal artery injury 05/17/2017   Pre-diabetes    Discharge Diagnoses:   Active Problems:   S/P left total hip arthroplasty  Estimated body mass index is 38.98 kg/m as calculated from the following:   Height as of this encounter: '5\' 8"'$  (1.727 m).   Weight as of this encounter: 116.3 kg.  Procedure:  Procedure(s) (LRB): TOTAL HIP ARTHROPLASTY ANTERIOR APPROACH (Left)   Consults: None  HPI:  [name] is a 75 y.o. female who had   presented to office for evaluation of left hip pain.  Radiographs revealed   progressive degenerative changes with bone-on-bone   articulation of the  hip joint, including subchondral cystic changes and osteophytes.  The patient had painful limited range of   motion significantly affecting their overall quality of life and function.  The patient was failing to    respond to conservative measures  including medications and/or injections and activity modification and at this point was ready   to proceed with more definitive measures.  Consent was obtained for   benefit of pain relief.  Specific risks of infection, DVT, component   failure, dislocation, neurovascular injury, and need for revision surgery were reviewed in the office as well discussion of   the anterior versus posterior approach were reviewed.  Laboratory Data: Admission on 01/28/2021, Discharged on 01/29/2021  Component Date Value Ref Range Status   ABO/RH(D) 01/28/2021    Final                   Value:A POS Performed at [facility], [place] [address], [place], [place] [address]    WBC 01/29/2021 13.3 (A) 4.0 - 10.5 K/uL Final   RBC 01/29/2021 4.06  3.87 - 5.11 MIL/uL Final   Hemoglobin 01/29/2021 12.8  12.0 - 15.0 g/dL Final   HCT 01/29/2021 39.2  36.0 - 46.0 % Final   MCV 01/29/2021 96.6  80.0 - 100.0 fL Final   MCH 01/29/2021 31.5  26.0 - 34.0 pg Final   MCHC 01/29/2021 32.7  30.0 - 36.0 g/dL Final   RDW 01/29/2021 13.2  11.5 - 15.5 % Final   Platelets 01/29/2021 214  150 - 400 K/uL Final   nRBC 01/29/2021 0.0  0.0 - 0.2 % Final   Performed at [facility], [place] [address]  [address] [place], [place] [address]   Sodium 01/29/2021 139  135 - 145 mmol/L Final   Potassium 01/29/2021 3.8  3.5 - 5.1 mmol/L Final   Chloride 01/29/2021 105  98 - 111 mmol/L Final   CO2 01/29/2021 28  22 - 32 mmol/L Final   Glucose, Bld 01/29/2021 166 (A) 70 - 99 mg/dL Final   Glucose reference range applies only to samples taken after fasting for at least 8 hours.   BUN 01/29/2021 18  8 - 23 mg/dL Final   Creatinine, Ser 01/29/2021 0.83  0.44 - 1.00 mg/dL Final   Calcium 01/29/2021 8.9  8.9 - 10.3 mg/dL Final   GFR, Estimated 01/29/2021 >60  >60 mL/min Final   Comment: (NOTE) Calculated using the CKD-EPI Creatinine Equation (2021)    Anion gap 01/29/2021 6  5 - 15 Final   Performed at [facility], [place] [address], [place], [place] [address]  Orders Only on 01/24/2021  Component Date Value Ref Range Status   SARS Coronavirus 2 01/24/2021 RESULT: NEGATIVE   Final   Comment: RESULT: NEGATIVESARS-CoV-2 INTERPRETATION:A NEGATIVE  test result means that SARS-CoV-2 RNA was not present in the specimen above the limit of detection of this test. This does not preclude a possible SARS-CoV-2 infection and should not be used as the  sole basis for patient management decisions. Negative results must be combined with clinical observations, patient history, and epidemiological information. Optimum specimen types and timing for peak viral levels during infections caused by SARS-CoV-2  have not been determined. Collection of multiple specimens or types of specimens may be necessary to detect virus. Improper specimen collection and handling, sequence variability under primers/probes, or organism present below the limit of detection may  lead to false negative results. Positive and negative predictive values of testing are highly dependent on prevalence. False negative test results are more likely when prevalence of disease is high.The expected result is NEGATIVE.Fact S                          heet for  Healthcare Providers: LocalChronicle.no Sheet for Patients: SalonLookup.es Reference Range - Negative   Hospital Outpatient Visit on 01/20/2021  Component Date Value Ref Range Status   MRSA, PCR 01/20/2021 NEGATIVE  NEGATIVE Final   Staphylococcus aureus 01/20/2021 NEGATIVE  NEGATIVE Final   Comment: (NOTE) The Xpert SA Assay (FDA approved for NASAL specimens in patients 15 years of age and older), is one component of a comprehensive surveillance program. It is not intended to diagnose infection nor to guide or monitor treatment. Performed at [facility], [place] [address] [place], [place] [address]    WBC  01/20/2021 6.7  4.0 - 10.5 K/uL Final   RBC 01/20/2021 4.63  3.87 - 5.11 MIL/uL Final   Hemoglobin 01/20/2021 14.7  12.0 - 15.0 g/dL Final   HCT 01/20/2021 45.1  36.0 - 46.0 % Final   MCV 01/20/2021 97.4  80.0 - 100.0 fL Final   MCH 01/20/2021 31.7  26.0 - 34.0 pg Final   MCHC 01/20/2021 32.6  30.0 - 36.0 g/dL Final   RDW 01/20/2021 13.2  11.5 - 15.5 % Final   Platelets 01/20/2021 225  150 - 400 K/uL Final   nRBC 01/20/2021 0.0  0.0 - 0.2 % Final   Performed at [facility], [place] [address], [place], [place] [address]   Sodium 01/20/2021 141  135 - 145 mmol/L Final   Potassium  01/20/2021 3.7  3.5 - 5.1 mmol/L Final   Chloride 01/20/2021 101  98 - 111 mmol/L Final   CO2 01/20/2021 31  22 - 32 mmol/L Final   Glucose, Bld 01/20/2021 104 (A) 70 - 99 mg/dL Final   Glucose reference range applies only to samples taken after fasting for at least 8 hours.   BUN 01/20/2021 26 (A) 8 - 23 mg/dL Final   Creatinine, Ser 01/20/2021 0.84  0.44 - 1.00 mg/dL Final   Calcium 01/20/2021 9.5  8.9 - 10.3 mg/dL Final   Total Protein 01/20/2021 6.4 (A) 6.5 - 8.1 g/dL Final   Albumin 01/20/2021 3.9  3.5 - 5.0 g/dL Final   AST 01/20/2021 21  15 - 41 U/L Final   ALT 01/20/2021 21  0 - 44 U/L Final   Alkaline Phosphatase 01/20/2021 57  38 - 126 U/L Final   Total Bilirubin 01/20/2021 0.7  0.3 - 1.2 mg/dL Final   GFR, Estimated 01/20/2021 >60  >60 mL/min Final   Comment: (NOTE) Calculated using the CKD-EPI Creatinine Equation (2021)    Anion gap 01/20/2021 9  5 - 15 Final   Performed at [facility], [place] [address], [place], [place] [address]   ABO/RH(D) 01/20/2021 A POS   Final   Antibody Screen 01/20/2021 NEG   Final   Sample Expiration 01/20/2021 01/31/2021,2359   Final   Extend sample reason 01/20/2021    Final                   Value:NO TRANSFUSIONS OR PREGNANCY IN THE PAST 3 MONTHS Performed at [place] [address], [place], [place]  [address]    Hgb A1c MFr Bld 01/20/2021 5.6  4.8 - 5.6 % Final   Comment: (NOTE) Pre diabetes:          5.7%-6.4%  Diabetes:              >6.4%  Glycemic control for   <7.0% adults with diabetes    Mean Plasma Glucose 01/20/2021 114.02  mg/dL Final   Performed at [facility], [place] [address] [place], [place] [address]     X-Rays:DG Pelvis Portable  Result Date: 01/28/2021 CLINICAL DATA:  Status post LEFT hip replacement. EXAM: PORTABLE PELVIS 1-2 VIEWS COMPARISON:  Intraoperative evaluation of January 08, 2021 FINDINGS: Post LEFT hip arthroplasty on this low AP pelvic evaluation, frontal radiograph excluding the iliac crests without acute finding. Small amounts of gas in the soft tissues adjacent to the hip and upper thigh as expected in the recent postoperative setting. IMPRESSION: Expected findings following LEFT hip arthroplasty, no acute findings on AP projection. Electronically Signed   By: Zetta Bills M.D.   On: 01/28/2021 13:38   DG C-Arm 1-60 Min-No Report  Result Date: 01/28/2021 Fluoroscopy was utilized by the requesting physician.  No radiographic interpretation.   DG HIP OPERATIVE UNILAT W OR W/O PELVIS LEFT  Result Date: 01/28/2021 CLINICAL DATA:  Left hip replacement. EXAM: OPERATIVE LEFT HIP (WITH PELVIS IF PERFORMED) 1 VIEW TECHNIQUE: Fluoroscopic spot image(s) were submitted for interpretation post-operatively. FLUOROSCOPY TIME:  12 seconds. COMPARISON:  CT abdomen pelvis dated December 23, 2018. FINDINGS: Intraoperative fluoroscopic images demonstrate left acetabular and femoral stem components in good position. The femoral head component has not been placed yet. No acute osseous abnormality. IMPRESSION: Intraoperative fluoroscopic guidance for left total hip arthroplasty. Electronically Signed   By: Titus Dubin M.D.   On: 01/28/2021 14:25  EKG: Orders placed or performed in visit on 07/09/20   EKG 12-Lead     Hospital Course: Tallie Plewa is a 75 y.o.  who was admitted to [facility]. They were brought to the operating room on 01/28/2021 and underwent Procedure(s): [place].  Patient tolerated the procedure well and was later transferred to the recovery room and then to the orthopaedic floor for postoperative care. They were given PO and IV analgesics for pain control following their surgery. They were given 24 hours of postoperative antibiotics of  Anti-infectives (From admission, onward)    Start     Dose/Rate Route Frequency Ordered Stop   01/29/21 0000  doxycycline (VIBRAMYCIN) 50 MG capsule  Status:  Discontinued        50 mg Oral 2 times daily 01/29/21 0840 01/29/21    01/29/21 0000  doxycycline (VIBRAMYCIN) 50 MG capsule       Note to Pharmacy: Please delete order for 50 mg BID, intended to be 100 mg BID   100 mg Oral 2 times daily 01/29/21 0937 02/05/21 2359   01/28/21 1630  ceFAZolin (ANCEF) IVPB 2g/100 mL premix        2 g 200 mL/hr over 30 Minutes Intravenous Every 6 hours 01/28/21 1400 01/28/21 2238   01/28/21 0815  ceFAZolin (ANCEF) IVPB 2g/100 mL premix        2 g 200 mL/hr over 30 Minutes Intravenous On call to O.R. 01/28/21 0802 01/28/21 1034      and started on DVT prophylaxis in the form of  Eliquis .   PT and OT were ordered for total joint protocol. Discharge planning consulted to help with postop disposition and equipment needs.  Patient had a good night on the evening of surgery. They started to get up OOB with therapy on POD #0. Pt was seen during rounds and was ready to go home pending progress with therapy. She worked with therapy on POD #1 and was meeting her goals. Pt was discharged to home later that day in stable condition.  Diet: Regular diet Activity: WBAT Follow-up: in 2 weeks Disposition: Home Discharged Condition: good   Discharge Instructions     Call MD / Call 911   Complete by: As directed    If you experience chest pain or shortness of breath, CALL 911 and  be transported to the hospital emergency room.  If you develope a fever above 101 F, pus (white drainage) or increased drainage or redness at the wound, or calf pain, call your surgeon's office.   Change dressing   Complete by: As directed    Maintain surgical dressing until follow up in the clinic. If the edges start to pull up, may reinforce with tape. If the dressing is no longer working, may remove and cover with gauze and tape, but must keep the area dry and clean.  Call with any questions or concerns.   Constipation Prevention   Complete by: As directed    Drink plenty of fluids.  Prune juice may be helpful.  You may use a stool softener, such as Colace (over the counter) 100 mg twice a day.  Use MiraLax (over the counter) for constipation as needed.   Diet - low sodium heart healthy   Complete by: As directed    Increase activity slowly as tolerated   Complete by: As directed    Weight bearing as tolerated with assist device (walker, cane, etc) as directed, use it as long  as suggested by your surgeon or therapist, typically at least 4-6 weeks.   Post-operative opioid taper instructions:   Complete by: As directed    POST-OPERATIVE OPIOID TAPER INSTRUCTIONS: It is important to wean off of your opioid medication as soon as possible. If you do not need pain medication after your surgery it is ok to stop day one. Opioids include: Codeine, Hydrocodone(Norco, Vicodin), Oxycodone(Percocet, oxycontin) and hydromorphone amongst others.  Long term and even short term use of opiods can cause: Increased pain response Dependence Constipation Depression Respiratory depression And more.  Withdrawal symptoms can include Flu like symptoms Nausea, vomiting And more Techniques to manage these symptoms Hydrate well Eat regular healthy meals Stay active Use relaxation techniques(deep breathing, meditating, yoga) Do Not substitute Alcohol to help with tapering If you have been on opioids for less  than two weeks and do not have pain than it is ok to stop all together.  Plan to wean off of opioids This plan should start within one week post op of your joint replacement. Maintain the same interval or time between taking each dose and first decrease the dose.  Cut the total daily intake of opioids by one tablet each day Next start to increase the time between doses. The last dose that should be eliminated is the evening dose.      TED hose   Complete by: As directed    Use stockings (TED hose) for 2 weeks on both leg(s).  You may remove them at night for sleeping.      Allergies as of 01/29/2021       Reactions   Sulfa Antibiotics Other (See Comments)   Turned eyes yellow    Sulfonamide Derivatives Rash   Turned eyes yellow   Milk-related Compounds Diarrhea   Atenolol Cough   Erythromycin Nausea Only   Plavix [clopidogrel Bisulfate] Cough        Medication List     STOP taking these medications    acetaminophen 650 MG CR tablet Commonly known as: TYLENOL   traMADol 50 MG tablet Commonly known as: ULTRAM       TAKE these medications    amoxicillin 500 MG capsule Commonly known as: AMOXIL Take 2,000 mg by mouth See admin instructions. Take 2000 mg by mouth 1 hour prior to dental appointment   aspirin EC 81 MG tablet Take 81 mg by mouth daily.   CALCIUM + D3 PO Take 1 tablet by mouth daily.   diltiazem 240 MG 24 hr capsule Commonly known as: CARDIZEM CD Take 1 capsule (240 mg total) by mouth daily.   docusate sodium 100 MG capsule Commonly known as: COLACE Take 1 capsule (100 mg total) by mouth 2 (two) times daily. What changed:  how much to take when to take this   doxycycline 50 MG capsule Commonly known as: VIBRAMYCIN Take 2 capsules (100 mg total) by mouth 2 (two) times daily for 7 days.   Eliquis 5 MG Tabs tablet Generic drug: apixaban TAKE 1 TABLET BY MOUTH 2 TIMES DAILY. What changed:  how much to take when to take this   famotidine  20 MG tablet Commonly known as: PEPCID Take 20 mg by mouth daily.   fluticasone 50 MCG/ACT nasal spray Commonly known as: FLONASE Place 1-2 sprays into both nostrils at bedtime as needed for allergies.   HYDROcodone-acetaminophen 5-325 MG tablet Commonly known as: NORCO/VICODIN Take 1-2 tablets by mouth every 6 (six) hours as needed for severe pain.   loratadine  10 MG tablet Commonly known as: CLARITIN Take 10 mg by mouth daily.   methocarbamol 500 MG tablet Commonly known as: ROBAXIN Take 1 tablet (500 mg total) by mouth every 6 (six) hours as needed for muscle spasms.   multivitamin with minerals Tabs tablet Take 1 tablet by mouth daily.   Pataday 0.2 % Soln Generic drug: Olopatadine HCl Place 1 drop into both eyes daily.   polyethylene glycol 17 g packet Commonly known as: MIRALAX / GLYCOLAX Take 17 g by mouth daily as needed for mild constipation.   potassium chloride SA 20 MEQ tablet Commonly known as: KLOR-CON TAKE 1/2 TABLET (10 MEQ TOTAL) BY MOUTH DAILY.   Premarin vaginal cream Generic drug: conjugated estrogens 1/2 gram vaginally twice weekly What changed:  how much to take how to take this when to take this reasons to take this additional instructions   propranolol 10 MG tablet Commonly known as: INDERAL Take 1 tablet (10 mg total) by mouth 4 (four) times daily as needed (palpitations or fast heart rate).   psyllium 0.52 g capsule Commonly known as: REGULOID Take 2.6 g by mouth 4 (four) times daily.   triamterene-hydrochlorothiazide 75-50 MG tablet Commonly known as: MAXZIDE Take 1 tablet by mouth daily. Notes to patient: Held today secondary to patient request. Resume 08/25   vitamin C 1000 MG tablet Take 1,000 mg by mouth daily.               Discharge Care Instructions  (From admission, onward)           Start     Ordered   01/29/21 0000  Change dressing       Comments: Maintain surgical dressing until follow up in the  clinic. If the edges start to pull up, may reinforce with tape. If the dressing is no longer working, may remove and cover with gauze and tape, but must keep the area dry and clean.  Call with any questions or concerns.   01/29/21 0840            Follow-up Information     Paralee Cancel, MD. Schedule an appointment as soon as possible for a visit in 2 week(s).   Specialty: Orthopedic Surgery Contact information: [address] [place] [address] [id number]                 Signed: Griffith Citron, PA-C Orthopedic Surgery 02/03/2021, 1:55 PM

## 2021-02-04 NOTE — Anesthesia Postprocedure Evaluation (Signed)
Anesthesia Post Note  Patient: [name]  Procedure(s) Performed: TOTAL HIP ARTHROPLASTY ANTERIOR APPROACH (Left: Hip)     Patient location during evaluation: PACU Anesthesia Type: General Level of consciousness: awake and alert Pain management: pain level controlled Vital Signs Assessment: post-procedure vital signs reviewed and stable Respiratory status: spontaneous breathing, nonlabored ventilation, respiratory function stable and patient connected to nasal cannula oxygen Cardiovascular status: blood pressure returned to baseline and stable Postop Assessment: no apparent nausea or vomiting Anesthetic complications: no   No notable events documented.  Last Vitals:  Vitals:   01/29/21 0918 01/29/21 1400  BP: 116/61 (!) 113/49  Pulse: 68 73  Resp: 18 14  Temp: 36.8 C 36.8 C  SpO2: 99% 94%    Last Pain:  Vitals:   01/29/21 1400  TempSrc: Oral  PainSc:                  Zaviyar Rahal

## 2021-02-20 ENCOUNTER — Ambulatory Visit: Payer: Medicare PPO

## 2021-03-06 ENCOUNTER — Ambulatory Visit: Payer: Medicare PPO | Attending: Internal Medicine

## 2021-03-06 ENCOUNTER — Other Ambulatory Visit (HOSPITAL_BASED_OUTPATIENT_CLINIC_OR_DEPARTMENT_OTHER): Payer: Self-pay

## 2021-03-06 DIAGNOSIS — Z23 Encounter for immunization: Secondary | ICD-10-CM

## 2021-03-06 MED ORDER — PFIZER COVID-19 VAC BIVALENT 30 MCG/0.3ML IM SUSP
INTRAMUSCULAR | 0 refills | Status: AC
  Filled 2021-03-06: qty 0.3, 1d supply, fill #0

## 2021-03-06 NOTE — Progress Notes (Signed)
   Covid-19 Vaccination Clinic  Name:  Diala Waxman    MRN: [id number] DOB: 09-20-1945  03/06/2021  Ms. Millikan was observed post Covid-19 immunization for 15 minutes without incident. She was provided with Vaccine Information Sheet and instruction to access the V-Safe system.   Ms. Angelo was instructed to call 911 with any severe reactions post vaccine: Difficulty breathing  Swelling of face and throat  A fast heartbeat  A bad rash all over body  Dizziness and weakness

## 2021-04-28 ENCOUNTER — Telehealth: Payer: Self-pay | Admitting: *Deleted

## 2021-04-28 NOTE — Telephone Encounter (Signed)
   Pre-operative Risk Assessment    Patient Name: [name]  DOB: [birth year] MRN: [id number]      Request for Surgical Clearance   Procedure:   LEFT INDEX FINGER TENOLYSIS  Date of Surgery: Clearance 05/06/21                                 Surgeon:  DR. FRED [facility] Surgeon's Group or Practice Name:  [facility] Phone number:  [phone] Fax number:  [phone]   Type of Clearance Requested: - Medical  - Pharmacy:  Hold Apixaban (Eliquis) : PT IS ON ASA, THOUGH ASA IS NOT LISTED ON CLEARANCE TO BE HELD. I LEFT MESSAGE FOR SURGEON'S OFFICE TO CALL OUR OFFICE AND EITHER NEED INSTRUCTIONS ON HOLDING ASA OR IF OK TO REMAIN ON ASA FOR PROCEDURE   Type of Anesthesia:   CHOICE; LEFT MESSAGE TO CONFIRM TYPE OF ANESTHESIA   Additional requests/questions:   [name]   04/28/2021, 10:49 AM

## 2021-04-28 NOTE — Telephone Encounter (Signed)
Patient with diagnosis of afib on Eliquis for anticoagulation.    Procedure: LEFT INDEX FINGER TENOLYSIS Date of procedure: 05/06/21  CHA2DS2-VASc Score = 5  This indicates a 7.2% annual risk of stroke. The patient's score is based upon: CHF History: 0 HTN History: 1 Diabetes History: 1 Stroke History: 0 Vascular Disease History: 1 Age Score: 1 Gender Score: 1   Had DVT in 2021 while on Eliquis  CrCl 27mL/min using adjusted body weight due to obesity Platelet count 214K  Per office protocol, patient can hold Eliquis for 1 day prior to procedure. Resume as soon as safely possible after.

## 2021-04-29 NOTE — Telephone Encounter (Signed)
    Patient Name: [name]  DOB: [birth year] MRN: [id number]  Primary Cardiologist: Mertie Moores, MD  Chart reviewed as part of pre-operative protocol coverage. Patient was last seen by Dr. Acie Fredrickson in 07/2020. Patient was contacted today for further pre-op evaluation and reported doing well since lat visit. No chest pain, shortness of breath, orthopnea/PND, palpitations, syncope. Able to complete >4.0 METS without any problems. Given past medical history and time since last visit, based on ACC/AHA guidelines, [name] would be at acceptable risk for the planned procedure without further cardiovascular testing.   Per Pharmacy and office protocol, "patient can hold Eliquis for 1 day prior to procedure." Please resume as soon as safely possible after.  I will route this recommendation to the requesting party via Epic fax function and remove from pre-op pool.  Please call with questions.  Darreld Mclean, PA-C 04/29/2021, 4:44 PM

## 2021-05-26 ENCOUNTER — Other Ambulatory Visit: Payer: Self-pay | Admitting: Cardiovascular Disease

## 2021-05-27 NOTE — Telephone Encounter (Signed)
Pt last saw Dr Acie Fredrickson 07/09/20, last labs 01/29/21 Creat 0.83, age 75, weight 116.3kg, based on specified criteria pt is on appropriate dosage of Eliquis 5mg  BID for afib.  Will refill rx.

## 2021-07-01 ENCOUNTER — Telehealth: Payer: Self-pay | Admitting: Cardiovascular Disease

## 2021-07-01 NOTE — Telephone Encounter (Signed)
Left message for patient to call back. Informed patient on message to have dentist office contact us and gave them fax number.

## 2021-07-01 NOTE — Telephone Encounter (Signed)
Patient is having a tooth extraction, she wants to know when she should stop taking her Eliquis. You can leave a message on her VM.

## 2021-07-07 NOTE — Telephone Encounter (Signed)
Called patient to follow-up on question about stopping Eliquis prior to tooth extraction. Pt states she was referred to a periodontist for the tooth removal and she cannot afford to see that doctor. She will not be seeking tooth removal at this time and will touch base back with her dentist who will then contact us if this clearance is needed.

## 2021-07-08 ENCOUNTER — Encounter: Payer: Self-pay | Admitting: Cardiovascular Disease

## 2021-07-08 ENCOUNTER — Other Ambulatory Visit: Payer: Self-pay

## 2021-07-08 ENCOUNTER — Ambulatory Visit: Payer: Medicare PPO | Admitting: Cardiovascular Disease

## 2021-07-08 VITALS — BP 118/78 | HR 72 | Ht 68.0 in | Wt 278.4 lb

## 2021-07-08 DIAGNOSIS — E782 Mixed hyperlipidemia: Secondary | ICD-10-CM

## 2021-07-08 DIAGNOSIS — I480 Paroxysmal atrial fibrillation: Secondary | ICD-10-CM | POA: Diagnosis not present

## 2021-07-08 DIAGNOSIS — I10 Essential (primary) hypertension: Secondary | ICD-10-CM

## 2021-07-08 MED ORDER — POTASSIUM CHLORIDE CRYS ER 20 MEQ PO TBCR: 20.0000 meq | EXTENDED_RELEASE_TABLET | Freq: Every day | ORAL | 3 refills | Status: DC

## 2021-07-08 NOTE — Patient Instructions (Addendum)
Medication Instructions:  Your physician has recommended you make the following change in your medication:   INCREASE the Potassium to 20 meq taking 1 tablet daily    *If you need a refill on your cardiac medications before your next appointment, please call your pharmacy*   Lab Work: 07/30/2021:   BMET & LIPID (come fasting, anytime between 7:15 and 5:00)  If you have labs (blood work) drawn today and your tests are completely normal, you will receive your results only by: [place] (if you have MyChart) OR A paper copy in the mail If you have any lab test that is abnormal or we need to change your treatment, we will call you to review the results.   Testing/Procedures: None ordered   Follow-Up: At [facility], you and your health needs are our priority.  As part of our continuing mission to provide you with exceptional heart care, we have created designated Provider Care Teams.  These Care Teams include your primary Cardiologist (physician) and Advanced Practice Providers (APPs -  Physician Assistants and Nurse Practitioners) who all work together to provide you with the care you need, when you need it.  We recommend signing up for the patient portal called "MyChart".  Sign up information is provided on this After Visit Summary.  MyChart is used to connect with patients for Virtual Visits (Telemedicine).  Patients are able to view lab/test results, encounter notes, upcoming appointments, etc.  Non-urgent messages can be sent to your provider as well.   To learn more about what you can do with MyChart, go to [email or web address].    Your next appointment:   12 month(s)  The format for your next appointment:   In Person  Provider:   Mertie Moores, MD  or Robbie Lis, PA-C or Richardson Dopp, [place]         Other Instructions

## 2021-07-08 NOTE — Progress Notes (Signed)
Cardiology Office Note   Date:  07/08/2021   ID:  Robin, Anthony 08/27/1945, MRN [id number]  PCP:  Leonard Downing, MD  Cardiologist:   Mertie Moores, MD   Chief Complaint  Patient presents with   Atrial Fibrillation   Hypertension          [name] is a 76 y.o. female who presents for follow up of her paroxysmal atrial fib  1. Hypertension 2. History of atrial fibrillation 3. Hyperlipidemia 4.  Knee replacement 5.  melanoma       [name] is doing well.  She has decreased her dose of Maxzide and her BP has been elevated.  She has not been exercising on a regular basis but has been trying to exercise more since New's Years Day.  She has lost 6 pounds.  She has not had any chest pain or dyspnea.   She did feel a little bit of extra salt recently.   January 02, 2013:  [name] has done well.  She had some various aches and pains (chest , back)   in Feb.  The pains improved with omeprazol but it caused excessive gas and bloating.  Her lipids have been minimally elevated.    07/05/2013:  [name] is doing well.   She has a sinus infection recently.     January 01, 2014:  [name] is doing well.  She has had some sinus infections.    Jan. 25, 2016:  [name] is doing well. No cardiac issues.  Has had a few palpitations - heart racing.  But her HR would be normal if she takes her pulse.  Rides her stationary bike 30 mninutes a day.   Jan. 24, 2017:  [name] is seen for follow up of her atrial fib.  CHADS2VASC = 3 ( female, HTN, AGE > 2)  She has PAF - was in NSR at her last visit. Is back in A-Fib today .  Asymptomatic.  No CP or dyspnea  Has occasional right sided chest pain .   Has right shoulder pain and is seeing Dr. Onnie Graham soon Had a Myoivew in 2012 - ( inf. Defect) .  Subsequent cath showed normal coronary arteries.   December 19, 2015: [name] is back in NSR today  Has PAF , has never needed cardioversion. Takes propranolol as needed. Has some  dizziness / orthostasis  Some of her dizziness is not related to orthostasis   Jan. 29, 2018:  [name] is seen today .    Has been having problems with her hemmorhoids.   Asked about watchman  I have suggested that she get her hemorrhoids fixed and we resume anticoagulation shortly thereafter.  She has occasional episodes of brief presyncope. Typically occur if she sitting down. Has never had complete syncope   Sept. 7 , 2018:  Has had several episodes of lightneadedness. Typically when she just stands up.   Also can have lightheadedness when she turns her head Has not had true syncope Has run out of potassium 2 weeks ago   September 08, 2017:  [name] is seen today  Dislocated her Right knee,   Required vascular surgery ( popliteal artery rupture) Was in rehab for a month Developed MSRS Was then found to have melanoma  Bilateral cataract removal.   Needs to have left hip replacement but they will not do it until she has lost about 25 lbs.   March 02, 2018:  [name] is seen today has been having issues with  abdominal cramping. Had chills and sweats Sunday night.     Wt. Is 285. 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 4, 2021  [name] is seen today for folow up of her PAF , HTN , obesity 3-4 weeks ago. Had some lightheadedness,  Difficulty taking a deep breath.  Had some mild CP .   Wt is 281 lbs  Is still having intermittant CP  She had labs at her primary MD.  CBC was unremarkable.  Basic metabolic profile was normal.  Creatinine 0.82.  Glucose is 84.  Sodium is 142.  Potassium is 3.6.  Liver enzymes are normal.  Total cholesterol is 166.  Triglyceride levels 103.  HDL is 46.  LDL is 101.  TSH is 1.85.  Hemoglobin A1c is 6.0.  Troponin I is less than 0.01.  Vitamin B-12 level is 687 which is normal.  Has had some PAF .  Is in NSR today  Takes propranolol as needed for   Feb. 1,  2022: [name] is seen today for follow up of her HTN, PAF, obesity [place] recently and cut her prox joint finger  / compound fracture  Has gained some weight . Lots of swelling in right leg.   On eliquis.    Is in NSR today  She had a DVT last year Has been riding stationary bike.   No CP   Jan. 31, 2023 [name] is seen today for follow up of her HTN, PAF, obesity  Wt is 278 lbs  Has had more surgery on her finger   L hip replacement in Aug  Needs to have a wisdom tooth taken out She will be at low risk for this procedure She may hold her Eliquis for 2 days prior to the procedure     Past Medical History:  Diagnosis Date   Abnormal Pap smear    Rare ASCUS-H   Arthritis    Atrial fibrillation ([place])    Cancer ([place])    melanoma rt leg   Chest pain    Diverticulitis    DVT (deep venous thrombosis) ([facility])    GERD (gastroesophageal reflux disease)    Headache    HTN (hypertension) 07/02/2015   Hypercholesteremia    Hyperglycemia 02/04/2011   Hyperlipidemia 02/04/2011   Hypertension    per pt no htn but needs meds for other reason   Hypokalemia 05/17/2017   Injury of right popliteal artery 05/17/2017   Knee dislocation 05/17/2017   Leg edema 02/04/2011   Leg swelling 02/02/2011   Leukocytosis 05/17/2017   MRSA (methicillin resistant Staphylococcus aureus)    R leg   Obesity    PAF (paroxysmal atrial fibrillation) ([place]) 02/02/2011   Perforated sigmoid colon ([place])    from diverticulitis   Popliteal artery injury 05/17/2017   Pre-diabetes     Past Surgical History:  Procedure Laterality Date   ABDOMINAL AORTOGRAM W/LOWER EXTREMITY Bilateral 02/19/2020   Procedure: ABDOMINAL AORTOGRAM W/LOWER EXTREMITY;  Surgeon: Waynetta Sandy, MD;  Location: [facility];  Service: Cardiovascular;  Laterality: Bilateral;  Rt.  lower  leg   ABDOMINAL HYSTERECTOMY     broken finger     BYPASS GRAFT POPLITEAL TO POPLITEAL Right 05/17/2017   Procedure: BYPASS GRAFT ABOVE  KNEE POPLITEAL TO BELOW KNEE POPLITEAL USING NONREVERSED RIGHT GREATER SAPHENOUS VEIN;  Surgeon: Waynetta Sandy, MD;  Location:  [facility];  Service: Vascular;  Laterality: Right;   CARDIAC CATHETERIZATION  07/11/2010   Est. EF of 60-65% -- Smooth and normal coronary arteries  -- Normal LV systolic function    CATARACT EXTRACTION, BILATERAL     08-12-17, 08-26-17   CHOLECYSTECTOMY     CYSTOCELE REPAIR  07/22/2010   Vault suspension, cystocele repair, graft and cystoscopy   FINGER SURGERY     MELANOMA EXCISION     right leg 3/12   NASAL SINUS SURGERY     NEUROMA SURGERY     rt foot   TONSILLECTOMY     TOTAL HIP ARTHROPLASTY Left 01/28/2021   Procedure: TOTAL HIP ARTHROPLASTY ANTERIOR APPROACH;  Surgeon: Paralee Cancel, MD;  Location: [facility];  Service: Orthopedics;  Laterality: Left;   TOTAL KNEE ARTHROPLASTY  06/20/2009   left knee   TOTAL KNEE ARTHROPLASTY  12/27/2008   right knee   TOTAL VAGINAL HYSTERECTOMY  07/22/2010   with bilateral salpingo-oophorectomy by Dr. Joan Flores   VEIN HARVEST Right 05/17/2017   Procedure: [place];  Surgeon: Waynetta Sandy, MD;  Location: [facility];  Service: Vascular;  Laterality: Right;     Current Outpatient Medications  Medication Sig Dispense Refill   amoxicillin (AMOXIL) 500 MG capsule Take 2,000 mg by mouth See admin instructions. Take 2000 mg by mouth 1 hour prior to dental appointment     Ascorbic Acid (VITAMIN C) 1000 MG tablet Take 1,000 mg by mouth daily.     aspirin EC 81 MG tablet Take 81 mg by mouth daily.      Calcium Carb-Cholecalciferol (CALCIUM + D3 PO) Take 1 tablet by mouth daily.     conjugated estrogens (PREMARIN) vaginal cream 1/2 gram vaginally twice weekly 30 g 1   COVID-19 mRNA bivalent vaccine, Pfizer, (PFIZER COVID-19 VAC BIVALENT) injection Inject into the muscle. 0.3 mL 0   diltiazem (CARDIZEM CD) 240 MG 24 hr capsule Take 1 capsule (240 mg total) by mouth daily. 90 capsule 3    docusate sodium (COLACE) 100 MG capsule Take 1 capsule (100 mg total) by mouth 2 (two) times daily. 10 capsule 0   ELIQUIS 5 MG TABS tablet TAKE 1 TABLET BY MOUTH 2 TIMES DAILY. 60 tablet 5   famotidine (PEPCID) 20 MG tablet Take 20 mg by mouth daily.     fluticasone (FLONASE) 50 MCG/ACT nasal spray Place 1-2 sprays into both nostrils at bedtime as needed for allergies.     loratadine (CLARITIN) 10 MG tablet Take 10 mg by mouth daily.     Multiple Vitamin (MULTIVITAMIN WITH MINERALS) TABS tablet Take 1 tablet by mouth daily.     PATADAY 0.2 % SOLN Place 1 drop into both eyes daily.   4   polyethylene glycol (MIRALAX / GLYCOLAX) 17 g packet Take 17 g by mouth daily as needed for mild constipation. 14 each 0   potassium chloride SA (KLOR-CON M) 20 MEQ tablet Take 1 tablet (20 mEq total) by mouth daily. 90 tablet 3   propranolol (INDERAL) 10 MG tablet Take 1 tablet (10 mg total) by mouth 4 (four) times daily as needed (palpitations or fast heart rate). 60 tablet 6   psyllium (REGULOID) 0.52 g capsule Take 2.6 g by mouth 4 (four) times daily.     triamterene-hydrochlorothiazide (MAXZIDE) 75-50 MG tablet Take 1 tablet by mouth daily.     chlorpheniramine-HYDROcodone 10-8 MG/5ML hydrocodone 10 mg-chlorpheniramine 8 mg/5 mL oral susp extend.rel 12hr  TAKE 5 MLS BY  MOUTH EVERY 12 HOURS AS     No current facility-administered medications for this visit.    Allergies:   Sulfa antibiotics, Sulfonamide derivatives, Milk-related compounds, Atenolol, Erythromycin, and Plavix [clopidogrel bisulfate]    Social History:  The patient  reports that she has never smoked. She has never used smokeless tobacco. She reports that she does not drink alcohol and does not use drugs.   Family History:  The patient's family history includes Atrial fibrillation in her mother; Diabetes in her mother; Emphysema in her father; Hypertension in her mother; Other in her brother.    ROS:  Please see the history of present  illness.   Otherwise, review of systems are positive for none.   All other systems are reviewed and negative.    Physical Exam: Blood pressure 118/78, pulse 72, height 5\' 8"  (1.727 m), weight 278 lb 6.4 oz (126.3 kg), last menstrual period 07/22/2010, SpO2 94 %.  GEN:  Well nourished, well developed in no acute distress HEENT: Normal NECK: No JVD; No carotid bruits LYMPHATICS: No lymphadenopathy CARDIAC: RRR , no murmurs, rubs, gallops RESPIRATORY:  Clear to auscultation without rales, wheezing or rhonchi  ABDOMEN: Soft, non-tender, non-distended MUSCULOSKELETAL:  No edema; No deformity  SKIN: Warm and dry NEUROLOGIC:  Alert and oriented x 3   EKG:     Jan. 31, 2023.  NSR at 72.  NS ST / T wave abn.    Recent Labs: 01/20/2021: ALT 21 01/29/2021: BUN 18; Creatinine, Ser 0.83; Hemoglobin 12.8; Platelets 214; Potassium 3.8; Sodium 139    Lipid Panel    Component Value Date/Time   CHOL 181 06/06/2020 0817   TRIG 119 06/06/2020 0817   HDL 47 06/06/2020 0817   CHOLHDL 3.9 06/06/2020 0817   CHOLHDL 3.4 07/02/2015 0737   VLDL 16 07/02/2015 0737   LDLCALC 113 (H) 06/06/2020 0817      Wt Readings from Last 3 Encounters:  07/08/21 278 lb 6.4 oz (126.3 kg)  01/28/21 256 lb 6.3 oz (116.3 kg)  01/20/21 256 lb 6.4 oz (116.3 kg)      Other studies Reviewed: Additional studies/ records that were reviewed today include: . Review of the above records demonstrates:    ASSESSMENT AND PLAN:  1.  Paroxysmal atrial fib:     remains in NSR .    Cont eliquis  She needs a dental extraction. She is at low risk for her upcoming wisdom tooth extraction. She may hold her eliquis for 2 days prior to the procedure.   2. Hypertension:     BP is well controlled.  Potassium level slightly low.  Will increase the potassium chloride to 20 mill equivalents a day.  We will check a basic metabolic profile in 3 weeks.  3. Hyperlipidemia:    check lipids in several weeks    4. Obesity:     advised  continue weight loss      Current medicines are reviewed at length with the patient today.  The patient does not have concerns regarding medicines.  The following changes have been made:  no change  Labs/ tests ordered today include:   Orders Placed This Encounter  Procedures   Basic metabolic panel   Lipid panel   EKG 12-Lead     Disposition:   1 year office visit with an APP or me     Signed, Mertie Moores, MD  07/08/2021 9:19 AM    [place], [place]  [address] [place], [place]  [address] Phone: [phone]; Fax: [phone]

## 2021-07-30 ENCOUNTER — Other Ambulatory Visit: Payer: Self-pay

## 2021-07-30 ENCOUNTER — Other Ambulatory Visit: Payer: Medicare PPO | Admitting: *Deleted

## 2021-07-30 DIAGNOSIS — I10 Essential (primary) hypertension: Secondary | ICD-10-CM

## 2021-07-30 DIAGNOSIS — E782 Mixed hyperlipidemia: Secondary | ICD-10-CM

## 2021-07-30 DIAGNOSIS — I480 Paroxysmal atrial fibrillation: Secondary | ICD-10-CM

## 2021-07-30 LAB — LIPID PANEL
Chol/HDL Ratio: 3.4 ratio (ref 0.0–4.4)
Cholesterol, Total: 162 mg/dL (ref 100–199)
HDL: 47 mg/dL (ref >39)
LDL Chol Calc (NIH): 100 mg/dL — ABNORMAL HIGH (ref 0–99)
Triglycerides: 77 mg/dL (ref 0–149)
VLDL Cholesterol Cal: 15 mg/dL (ref 5–40)

## 2021-07-30 LAB — BASIC METABOLIC PANEL
BUN/Creatinine Ratio: 27 (ref 12–28)
BUN: 23 mg/dL (ref 8–27)
CO2: 26 mmol/L (ref 20–29)
Calcium: 9.5 mg/dL (ref 8.7–10.3)
Chloride: 101 mmol/L (ref 96–106)
Creatinine, Ser: 0.84 mg/dL (ref 0.57–1.00)
Glucose: 119 mg/dL — ABNORMAL HIGH (ref 70–99)
Potassium: 4.2 mmol/L (ref 3.5–5.2)
Sodium: 141 mmol/L (ref 134–144)
eGFR: 73 mL/min/{1.73_m2} (ref >59)

## 2021-07-31 ENCOUNTER — Telehealth: Payer: Self-pay | Admitting: *Deleted

## 2021-07-31 DIAGNOSIS — R739 Hyperglycemia, unspecified: Secondary | ICD-10-CM

## 2021-07-31 DIAGNOSIS — Z79899 Other long term (current) drug therapy: Secondary | ICD-10-CM

## 2021-07-31 DIAGNOSIS — E7849 Other hyperlipidemia: Secondary | ICD-10-CM

## 2021-07-31 DIAGNOSIS — I739 Peripheral vascular disease, unspecified: Secondary | ICD-10-CM

## 2021-07-31 DIAGNOSIS — I872 Venous insufficiency (chronic) (peripheral): Secondary | ICD-10-CM

## 2021-07-31 MED ORDER — ROSUVASTATIN CALCIUM 10 MG PO TABS: 10.0000 mg | ORAL_TABLET | Freq: Every day | ORAL | 1 refills | Status: DC

## 2021-07-31 NOTE — Telephone Encounter (Signed)
-----   Message from Thayer Headings, MD sent at 07/31/2021 12:28 PM EST ----- LDL is 100.  She has PAD Goal LDL is < 70 Please start rosuvastatin 10 mg a day  Recheck lipids, ALT , BMP in 3 months

## 2021-07-31 NOTE — Telephone Encounter (Signed)
Pt made aware of Pharmacist information regarding Prevagen use, as indicated in this message. Will route this message to ask Dr. Acie Fredrickson if it is ok to add on A1C to her 3 months follow-up labs in May.

## 2021-07-31 NOTE — Telephone Encounter (Signed)
The patient has been notified of the result and verbalized understanding.  All questions (if any) were answered.  Pt aware to start taking rosuvastatin 10 mg po daily and come in for repeat lipids, ALT, and BMET in 3 months.   Confirmed the pharmacy of choice with the pt. Pt is inquiring if Dr. Acie Fredrickson would also check an A1C on her, when she comes back into the office for repeat labs in 3 months.  Informed her that I will need to get an order from him to add on an A1C to her 3 month follow-up lab appt.  We will add this on accordingly after he advises.  Pt is scheduled for repeat lab in 3 months on 10/28/21.  She is aware to come fasting.   Pt did want to also ask Dr. Acie Fredrickson and Pharmacist if Prevagen would interact with any of her cardiac meds/history.  Informed the pt that I will route this message for further advisement, and triage will follow-up accordingly thereafter. Pt states if we call back and she is unable to answer, then leave a detailed message on her home machine, for she checks that frequently.  Pt verbalized understanding and agrees with this plan.

## 2021-07-31 NOTE — Telephone Encounter (Signed)
Generally recommend against Prevagen use due to false claims of efficacy and the supplement is not cheap - there was a major lawsuit going on because of false advertising. Doesn't specifically interact with her other meds though.

## 2021-08-04 NOTE — Addendum Note (Signed)
Addended by: Molli Barrows on: 08/04/2021 07:54 AM   Modules accepted: Orders

## 2021-08-04 NOTE — Telephone Encounter (Signed)
Per Dr. Acie Fredrickson, order for Hgb A1c added to labs for 10/28/2021.

## 2021-08-04 NOTE — Telephone Encounter (Signed)
Called and spoke with patient to inform her that we will obtain Hgb A1c with labs due in May per Dr. Acie Fredrickson. Patient verbalized understanding and thanked me for calling.

## 2021-08-05 ENCOUNTER — Other Ambulatory Visit: Payer: Self-pay | Admitting: Cardiovascular Disease

## 2021-08-14 HISTORY — PX: WISDOM TOOTH EXTRACTION: SHX21

## 2021-08-18 ENCOUNTER — Other Ambulatory Visit: Payer: Self-pay | Admitting: *Deleted

## 2021-08-18 DIAGNOSIS — M79661 Pain in right lower leg: Secondary | ICD-10-CM

## 2021-08-18 DIAGNOSIS — I872 Venous insufficiency (chronic) (peripheral): Secondary | ICD-10-CM

## 2021-08-25 ENCOUNTER — Other Ambulatory Visit: Payer: Self-pay | Admitting: Family Medicine

## 2021-08-25 DIAGNOSIS — Z1231 Encounter for screening mammogram for malignant neoplasm of breast: Secondary | ICD-10-CM

## 2021-08-28 NOTE — Progress Notes (Signed)
?Office Note  ? ? ? ?CC:  follow up ?Requesting Provider:  Leonard Downing, * ? ?HPI: [name] is a 76 y.o. (Sep 24, 1945) female who presents for follow up of PAD. She has history of right above-knee to below-knee popliteal artery bypass with vein performed for acute right lower extremity ischemia with knee dislocation by Dr. Donzetta Matters on 05/17/17. She has been without symptoms. She does have lower extremity swelling bilaterally which has improved with compression stockings. She returns today for routine follow up with ABI's. ? ?She denies any claudication, rest pain or tissue loss. She has had improvement in her leg swelling but is very diligent about wearing her compression stockings daily. She remains very active and exercises regularly.  ? ?The pt is on a statin for cholesterol management.  ?The pt is on a daily aspirin.   Other AC:  Eliquis ?The pt is on BB, Maxide for hypertension.   ?The pt is not diabetic. ?Tobacco hx:  Never ? ?Past Medical History:  ?Diagnosis Date  ? Abnormal Pap smear   ? Rare ASCUS-H  ? Arthritis   ? Atrial fibrillation ([place])   ? Cancer [facility])   ? melanoma rt leg  ? Chest pain   ? Diverticulitis   ? DVT (deep venous thrombosis) ([place])   ? GERD (gastroesophageal reflux disease)   ? Headache   ? HTN (hypertension) 07/02/2015  ? Hypercholesteremia   ? Hyperglycemia 02/04/2011  ? Hyperlipidemia 02/04/2011  ? Hypertension   ? per pt no htn but needs meds for other reason  ? Hypokalemia 05/17/2017  ? Injury of right popliteal artery 05/17/2017  ? Knee dislocation 05/17/2017  ? Leg edema 02/04/2011  ? Leg swelling 02/02/2011  ? Leukocytosis 05/17/2017  ? MRSA (methicillin resistant Staphylococcus aureus)   ? R leg  ? Obesity   ? PAF (paroxysmal atrial fibrillation) ([place]) 02/02/2011  ? Perforated sigmoid colon ([place])   ? from diverticulitis  ? Popliteal artery injury 05/17/2017  ? Pre-diabetes   ? ? ?Past Surgical History:  ?Procedure Laterality Date  ? ABDOMINAL AORTOGRAM W/LOWER  EXTREMITY Bilateral 02/19/2020  ? Procedure: ABDOMINAL AORTOGRAM W/LOWER EXTREMITY;  Surgeon: Waynetta Sandy, MD;  Location: [facility];  Service: Cardiovascular;  Laterality: Bilateral;  Rt.  lower  leg  ? ABDOMINAL HYSTERECTOMY    ? broken finger    ? BYPASS GRAFT POPLITEAL TO POPLITEAL Right 05/17/2017  ? Procedure: BYPASS GRAFT ABOVE KNEE POPLITEAL TO BELOW KNEE POPLITEAL USING NONREVERSED RIGHT GREATER SAPHENOUS VEIN;  Surgeon: Waynetta Sandy, MD;  Location: [facility];  Service: Vascular;  Laterality: Right;  ? CARDIAC CATHETERIZATION  07/11/2010  ? Est. EF of 60-65% -- Smooth and normal coronary arteries  -- Normal LV systolic function   ? CATARACT EXTRACTION, BILATERAL    ? 08-12-17, 08-26-17  ? CHOLECYSTECTOMY    ? CYSTOCELE REPAIR  07/22/2010  ? Vault suspension, cystocele repair, graft and cystoscopy  ? FINGER SURGERY    ? MELANOMA EXCISION    ? right leg 3/12  ? NASAL SINUS SURGERY    ? NEUROMA SURGERY    ? rt foot  ? TONSILLECTOMY    ? TOTAL HIP ARTHROPLASTY Left 01/28/2021  ? Procedure: TOTAL HIP ARTHROPLASTY ANTERIOR APPROACH;  Surgeon: Paralee Cancel, MD;  Location: [facility];  Service: Orthopedics;  Laterality: Left;  ? TOTAL KNEE ARTHROPLASTY  06/20/2009  ? left knee  ? TOTAL KNEE ARTHROPLASTY  12/27/2008  ? right knee  ? TOTAL VAGINAL HYSTERECTOMY  07/22/2010  ? with bilateral salpingo-oophorectomy by Dr. Joan Flores  ? VEIN HARVEST Right 05/17/2017  ? Procedure: RIGHT GREATER SAPHENOUS VEIN HARVEST;  Surgeon: Waynetta Sandy, MD;  Location: [facility];  Service: Vascular;  Laterality: Right;  ? WISDOM TOOTH EXTRACTION Right 08/14/2021  ? Mikey Bussing DDS  ? ? ?Social History  ? ?Socioeconomic History  ? Marital status: Married  ?  Spouse name: Not on file  ? Number of children: Not on file  ? Years of education: Not on file  ? Highest education level: Not on file  ?Occupational History  ? Not on file  ?Tobacco Use  ? Smoking status: Never  ? Smokeless tobacco: Never  ?Vaping Use   ? Vaping Use: Never used  ?Substance and Sexual Activity  ? Alcohol use: No  ? Drug use: No  ? Sexual activity: Yes  ?  Partners: Male  ?  Birth control/protection: Surgical  ?  Comment: LAVH  ?Other Topics Concern  ? Not on file  ?Social History Narrative  ? Not on file  ? ?Social Determinants of Health  ? ?Financial Resource Strain: Not on file  ?Food Insecurity: Not on file  ?Transportation Needs: Not on file  ?Physical Activity: Not on file  ?Stress: Not on file  ?Social Connections: Not on file  ?Intimate Partner Violence: Not on file  ? ? ?Family History  ?Problem Relation Age of Onset  ? Emphysema Father   ? Atrial fibrillation Mother   ?     has pacemaker  ? Diabetes Mother   ? Hypertension Mother   ? Other Brother   ?     bulbar palsy  ? ? ?Current Outpatient Medications  ?Medication Sig Dispense Refill  ? amoxicillin (AMOXIL) 500 MG capsule Take 2,000 mg by mouth See admin instructions. Take 2000 mg by mouth 1 hour prior to dental appointment    ? Ascorbic Acid (VITAMIN C) 1000 MG tablet Take 1,000 mg by mouth daily.    ? aspirin EC 81 MG tablet Take 81 mg by mouth daily.     ? Calcium Carb-Cholecalciferol (CALCIUM + D3 PO) Take 1 tablet by mouth daily.    ? chlorpheniramine-HYDROcodone 10-8 MG/5ML hydrocodone 10 mg-chlorpheniramine 8 mg/5 mL oral susp extend.rel 12hr  TAKE 5 MLS BY MOUTH EVERY 12 HOURS AS    ? conjugated estrogens (PREMARIN) vaginal cream 1/2 gram vaginally twice weekly 30 g 1  ? COVID-19 mRNA bivalent vaccine, Pfizer, (PFIZER COVID-19 VAC BIVALENT) injection Inject into the muscle. 0.3 mL 0  ? diltiazem (CARDIZEM CD) 240 MG 24 hr capsule TAKE 1 CAPSULE (240 MG TOTAL) BY MOUTH DAILY. 90 capsule 3  ? docusate sodium (COLACE) 100 MG capsule Take 1 capsule (100 mg total) by mouth 2 (two) times daily. 10 capsule 0  ? ELIQUIS 5 MG TABS tablet TAKE 1 TABLET BY MOUTH 2 TIMES DAILY. 60 tablet 5  ? famotidine (PEPCID) 20 MG tablet Take 20 mg by mouth daily.    ? fluticasone (FLONASE) 50 MCG/ACT  nasal spray Place 1-2 sprays into both nostrils at bedtime as needed for allergies.    ? loratadine (CLARITIN) 10 MG tablet Take 10 mg by mouth daily.    ? Multiple Vitamin (MULTIVITAMIN WITH MINERALS) TABS tablet Take 1 tablet by mouth daily.    ? PATADAY 0.2 % SOLN Place 1 drop into both eyes daily.   4  ? polyethylene glycol (MIRALAX / GLYCOLAX) 17 g packet Take 17 g by mouth daily as  needed for mild constipation. 14 each 0  ? potassium chloride SA (KLOR-CON M) 20 MEQ tablet Take 1 tablet (20 mEq total) by mouth daily. 90 tablet 3  ? propranolol (INDERAL) 10 MG tablet Take 1 tablet (10 mg total) by mouth 4 (four) times daily as needed (palpitations or fast heart rate). 60 tablet 6  ? psyllium (REGULOID) 0.52 g capsule Take 2.6 g by mouth 4 (four) times daily.    ? rosuvastatin (CRESTOR) 10 MG tablet Take 1 tablet (10 mg total) by mouth daily. 90 tablet 1  ? triamterene-hydrochlorothiazide (MAXZIDE) 75-50 MG tablet Take 1 tablet by mouth daily.    ? ?No current facility-administered medications for this visit.  ? ? ?Allergies  ?Allergen Reactions  ? Sulfa Antibiotics Other (See Comments)  ?  Turned eyes yellow   ? Sulfonamide Derivatives Rash  ?  Turned eyes yellow  ? Milk-Related Compounds Diarrhea  ? Atenolol Cough  ? Erythromycin Nausea Only  ? Plavix [Clopidogrel Bisulfate] Cough  ? ? ? ?REVIEW OF SYSTEMS:  ?'[X]'$  denotes positive finding, '[ ]'$  denotes negative finding ?Cardiac  Comments:  ?Chest pain or chest pressure:    ?Shortness of breath upon exertion:    ?Short of breath when lying flat:    ?Irregular heart rhythm:    ?    ?Vascular    ?Pain in calf, thigh, or hip brought on by ambulation:    ?Pain in feet at night that wakes you up from your sleep:     ?Blood clot in your veins:    ?Leg swelling:     ?    ?Pulmonary    ?Oxygen at home:    ?Productive cough:     ?Wheezing:     ?    ?Neurologic    ?Sudden weakness in arms or legs:     ?Sudden numbness in arms or legs:     ?Sudden onset of difficulty  speaking or slurred speech:    ?Temporary loss of vision in one eye:     ?Problems with dizziness:     ?    ?Gastrointestinal    ?Blood in stool:     ?Vomited blood:     ?    ?Genitourinary    ?Burning when urinat

## 2021-09-01 ENCOUNTER — Encounter: Payer: Self-pay | Admitting: Physician Assistant

## 2021-09-01 ENCOUNTER — Ambulatory Visit (HOSPITAL_COMMUNITY)
Admission: RE | Admit: 2021-09-01 | Discharge: 2021-09-01 | Disposition: A | Discharge status: Home / Self Care | Payer: Medicare PPO | Source: Ambulatory Visit | Attending: Physician Assistant | Admitting: Physician Assistant

## 2021-09-01 ENCOUNTER — Other Ambulatory Visit: Payer: Self-pay

## 2021-09-01 ENCOUNTER — Ambulatory Visit: Payer: Medicare PPO | Admitting: Physician Assistant

## 2021-09-01 ENCOUNTER — Ambulatory Visit (INDEPENDENT_AMBULATORY_CARE_PROVIDER_SITE_OTHER)
Admission: RE | Admit: 2021-09-01 | Discharge: 2021-09-01 | Disposition: A | Discharge status: Home / Self Care | Payer: Medicare PPO | Source: Ambulatory Visit | Attending: Physician Assistant | Admitting: Physician Assistant

## 2021-09-01 VITALS — BP 115/71 | HR 73 | Temp 98.0°F | Resp 18 | Ht 68.0 in | Wt 278.4 lb

## 2021-09-01 DIAGNOSIS — I872 Venous insufficiency (chronic) (peripheral): Secondary | ICD-10-CM

## 2021-09-01 DIAGNOSIS — M79661 Pain in right lower leg: Secondary | ICD-10-CM | POA: Insufficient documentation

## 2021-09-01 DIAGNOSIS — S85001A Unspecified injury of popliteal artery, right leg, initial encounter: Secondary | ICD-10-CM | POA: Diagnosis not present

## 2021-09-19 ENCOUNTER — Ambulatory Visit
Admission: RE | Admit: 2021-09-19 | Discharge: 2021-09-19 | Disposition: A | Discharge status: Home / Self Care | Payer: Medicare PPO | Source: Ambulatory Visit | Attending: Family Medicine | Admitting: Family Medicine

## 2021-09-19 DIAGNOSIS — Z1231 Encounter for screening mammogram for malignant neoplasm of breast: Secondary | ICD-10-CM

## 2021-10-10 ENCOUNTER — Telehealth: Payer: Self-pay

## 2021-10-10 NOTE — Telephone Encounter (Signed)
Received EKG readings from patient's PCP office. Dr. Acie Fredrickson reviewed and stated there was no significant changes to EKGs. Will have EKGs scanned into chart.  Called patient, informed patient's husband, DPR, with Dr. Elmarie Shiley response. ?

## 2021-10-28 ENCOUNTER — Other Ambulatory Visit: Payer: Medicare PPO | Admitting: *Deleted

## 2021-10-28 DIAGNOSIS — R739 Hyperglycemia, unspecified: Secondary | ICD-10-CM

## 2021-10-28 DIAGNOSIS — I739 Peripheral vascular disease, unspecified: Secondary | ICD-10-CM

## 2021-10-28 DIAGNOSIS — E7849 Other hyperlipidemia: Secondary | ICD-10-CM

## 2021-10-28 DIAGNOSIS — Z79899 Other long term (current) drug therapy: Secondary | ICD-10-CM

## 2021-10-29 LAB — LIPID PANEL
Chol/HDL Ratio: 2.4 ratio (ref 0.0–4.4)
Cholesterol, Total: 122 mg/dL (ref 100–199)
HDL: 51 mg/dL (ref >39)
LDL Chol Calc (NIH): 55 mg/dL (ref 0–99)
Triglycerides: 78 mg/dL (ref 0–149)
VLDL Cholesterol Cal: 16 mg/dL (ref 5–40)

## 2021-10-29 LAB — BASIC METABOLIC PANEL
BUN/Creatinine Ratio: 30 — ABNORMAL HIGH (ref 12–28)
BUN: 21 mg/dL (ref 8–27)
CO2: 27 mmol/L (ref 20–29)
Calcium: 9.4 mg/dL (ref 8.7–10.3)
Chloride: 103 mmol/L (ref 96–106)
Creatinine, Ser: 0.71 mg/dL (ref 0.57–1.00)
Glucose: 138 mg/dL — ABNORMAL HIGH (ref 70–99)
Potassium: 3.6 mmol/L (ref 3.5–5.2)
Sodium: 143 mmol/L (ref 134–144)
eGFR: 89 mL/min/{1.73_m2} (ref >59)

## 2021-10-29 LAB — ALT: ALT: 18 IU/L (ref 0–32)

## 2021-10-29 LAB — HEMOGLOBIN A1C
Est. average glucose Bld gHb Est-mCnc: 134 mg/dL
Hgb A1c MFr Bld: 6.3 % — ABNORMAL HIGH (ref 4.8–5.6)

## 2021-10-31 ENCOUNTER — Telehealth: Payer: Self-pay

## 2021-10-31 NOTE — Telephone Encounter (Signed)
A1C is 6.3   Sugars are too high   Cut back on carbohydrates and sweets     REcheck in  4 months   Fay Records, MD  10/29/2021  1:59 PM EDT     Electrolytes and kidney function are OK Lipid are excellent  A1C is still pending    Attempted to call patient to review above results and recommendations, but no answer. Will send MyChart message (she is active user) to review and verify if she would like to have the repeat A1C done here or at PCP office.

## 2021-11-04 NOTE — Telephone Encounter (Signed)
[name], Robin Cheng, MD  You 17 minutes ago (10:41 AM)   I agree with the nice discussion and outline of the issues and possible solutions  by Donnalee Curry, RN.   Alternatives would include  Zetia  PCSK-9 Inhibitors  Inclisiran   Lets get a coronary calcium score for further assessment and to see how aggerssive we need to be on her lipid lowering   PN    Called and spoke to patient who states that she will continue taking statin until after she sees the nutrition/wellness coach next month, then will decide on the CT calcium score. She will call us back to let us know if she wishes to proceed.

## 2021-11-04 NOTE — Telephone Encounter (Signed)
Pt messaged over concern of rise in glucose since starting Rosuvastatin '10mg'$  daily on 07/31/21. Pt's A1C went from 5.6 to 6.3. She is metting with a Landscape architect through [place] in June to discuss things she can better do to keep her blood sugar low, but also wanting to know if there is something else that can be prescribed that wouldn't negatively impact blood glucose. Per [email or web address] website: "Statins, are widely used in the primary and secondary prevention of cardiovascular diseases to lower serum cholesterol levels. As type 2 diabetes mellitus is accompanied by dyslipidemia, statins have a major role in preventing the long term complications in diabetes and are recommended for diabetics with normal low density lipoprotein levels as well. In 2012, [place] and Drug Administration released changes to statin safety label to include that statins have been found to increase glycosylated haemoglobin and fasting serum glucose levels. Many studies done on patients with cardiovascular risk factors have shown that statins have diabetogenic potential and the effect varies as per the dosage and type used. The various mechanisms for this effect have been proposed and one of them is downregulation of glucose transporters by the statins. The recommendations by the investigators are that though statins can have diabetogenic risk, they have more long term benefits which can outweigh the risk."  "Another study comparing glycaemic control between diabetic patients receiving atorvastatin 10 mg, pravastatin 10 mg or pitavastatin 2 mg/d showed that it was only the atorvastatin-treated patients in which the blood glucose and HbA1C levels increased[28]. Treatment with atorvastatin and simvastatin may be associated with an increased risk of new onset diabetes as compared to pravastatin[29]. Pitavastatin has shown favourable profile in patients with diabetes by improving insulin resistance and minimally impairing  glucose metabolism[30]."  "Ezetimibe also seems to improve renal function, insulin resistance and inflammatory markers. These actions are useful in patients with diabetes."  Will route to MD for review. Pt may benefit from different statin (although, studies are controversial) or switch to Zetia instead (but would lose the plaque stabilizing property).

## 2021-11-24 ENCOUNTER — Other Ambulatory Visit: Payer: Self-pay | Admitting: Cardiovascular Disease

## 2021-11-24 NOTE — Telephone Encounter (Signed)
Prescription refill request for Eliquis received. Indication:Afib Last office visit:1/23 Scr:0.7 Age: 76 Weight:126.3 kg  Prescription refilled

## 2021-11-25 ENCOUNTER — Telehealth: Payer: Self-pay

## 2021-11-25 NOTE — Telephone Encounter (Signed)
Patient with diagnosis of afib on Eliquis for anticoagulation.    Procedure: Rt Reverse Shoulder Arthroplasty Date of procedure: TBD  CHA2DS2-VASc Score = 5  This indicates a 7.2% annual risk of stroke. The patient's score is based upon: CHF History: 0 HTN History: 1 Diabetes History: 0 Stroke History: 0 Vascular Disease History: 1 Age Score: 2 Gender Score: 1  Had DVT in 2021 while on Eliquis  CrCl 96 ml/min using AdjBW Platelet count 214K  Per office protocol, patient can hold Eliquis for 2 days prior to procedure.  She should resume anticoagulation as soon as safely possible.

## 2021-11-25 NOTE — Telephone Encounter (Signed)
   Pre-operative Risk Assessment    Patient Name: [name]  DOB: [birth year] MRN: [id number]      Request for Surgical Clearance    Procedure:   Rt Reverse Shoulder Arthroplasty  Date of Surgery:  Clearance TBD                                 Surgeon:  Dr. Justice Britain Surgeon's Group or Practice Name:  [facility] Phone number:  [phone] Fax number:  [phone] [name]    Type of Clearance Requested:   - Medical  - Pharmacy:  Hold Aspirin and Apixaban (Eliquis)     Type of Anesthesia:  General    Additional requests/questions:    Signed, [name]   11/25/2021, 2:24 PM

## 2021-11-26 ENCOUNTER — Telehealth: Payer: Self-pay | Admitting: *Deleted

## 2021-11-26 NOTE — Telephone Encounter (Signed)
  Patient Consent for Virtual Visit        [name] has provided verbal consent on 11/26/2021 for a virtual visit (video or telephone).   CONSENT FOR VIRTUAL VISIT FOR:  [name]  By participating in this virtual visit I agree to the following:  I hereby voluntarily request, consent and authorize [facility] and its employed or contracted physicians, physician assistants, nurse practitioners or other licensed health care professionals (the Practitioner), to provide me with telemedicine health care services (the "Services") as deemed necessary by the treating Practitioner. I acknowledge and consent to receive the Services by the Practitioner via telemedicine. I understand that the telemedicine visit will involve communicating with the Practitioner through live audiovisual communication technology and the disclosure of certain medical information by electronic transmission. I acknowledge that I have been given the opportunity to request an in-person assessment or other available alternative prior to the telemedicine visit and am voluntarily participating in the telemedicine visit.  I understand that I have the right to withhold or withdraw my consent to the use of telemedicine in the course of my care at any time, without affecting my right to future care or treatment, and that the Practitioner or I may terminate the telemedicine visit at any time. I understand that I have the right to inspect all information obtained and/or recorded in the course of the telemedicine visit and may receive copies of available information for a reasonable fee.  I understand that some of the potential risks of receiving the Services via telemedicine include:  Delay or interruption in medical evaluation due to technological equipment failure or disruption; Information transmitted may not be sufficient (e.g. poor resolution of images) to allow for appropriate medical decision making by the  Practitioner; and/or  In rare instances, security protocols could fail, causing a breach of personal health information.  Furthermore, I acknowledge that it is my responsibility to provide information about my medical history, conditions and care that is complete and accurate to the best of my ability. I acknowledge that Practitioner's advice, recommendations, and/or decision may be based on factors not within their control, such as incomplete or inaccurate data provided by me or distortions of diagnostic images or specimens that may result from electronic transmissions. I understand that the practice of medicine is not an exact science and that Practitioner makes no warranties or guarantees regarding treatment outcomes. I acknowledge that a copy of this consent can be made available to me via my patient portal ([facility]), or I can request a printed copy by calling the office of [facility].    I understand that my insurance will be billed for this visit.   I have read or had this consent read to me. I understand the contents of this consent, which adequately explains the benefits and risks of the Services being provided via telemedicine.  I have been provided ample opportunity to ask questions regarding this consent and the Services and have had my questions answered to my satisfaction. I give my informed consent for the services to be provided through the use of telemedicine in my medical care

## 2021-11-26 NOTE — Telephone Encounter (Signed)
Pt has been scheduled for televisit, 02/19/22, per pt preference.  Consent on file.  Will route to preop call back to call back a day or so before, to go over medications.

## 2021-11-26 NOTE — Telephone Encounter (Signed)
    Name: [name]  DOB: 03-09-1946  MRN: [id number]  Primary Cardiologist: Mertie Moores, MD   Preoperative team, please contact this patient and set up a phone call appointment for further preoperative risk assessment. Please obtain consent and complete medication review. Thank you for your help.  I confirm that guidance regarding antiplatelet and oral anticoagulation therapy has been completed and, if necessary, noted below.    Ledora Bottcher, PA 11/26/2021, 10:34 AM [name] [address] [place], [place] [address]

## 2021-11-26 NOTE — Telephone Encounter (Signed)
Pt has been scheduled for a televisit, 02/19/22, per pt request, clearance will be addressed at that time.  Will route to the requesting surgeon's office to make them aware.

## 2021-11-28 NOTE — Telephone Encounter (Signed)
Med rec and consent are done.. Pt advised if she has any med changes she can call the office or we can address on 02/2022.[name]

## 2022-01-20 ENCOUNTER — Other Ambulatory Visit (HOSPITAL_COMMUNITY): Payer: Self-pay

## 2022-01-23 ENCOUNTER — Other Ambulatory Visit: Payer: Self-pay | Admitting: Cardiovascular Disease

## 2022-01-23 DIAGNOSIS — E7849 Other hyperlipidemia: Secondary | ICD-10-CM

## 2022-01-23 DIAGNOSIS — Z79899 Other long term (current) drug therapy: Secondary | ICD-10-CM

## 2022-01-23 DIAGNOSIS — I739 Peripheral vascular disease, unspecified: Secondary | ICD-10-CM

## 2022-02-18 ENCOUNTER — Telehealth: Payer: Self-pay | Admitting: Cardiovascular Disease

## 2022-02-18 NOTE — Telephone Encounter (Signed)
Pt called requesting labs orders from Dr. Acie Fredrickson be faxed over to her PCP    Fax number: [phone]

## 2022-02-18 NOTE — Telephone Encounter (Signed)
Requested labs routed/faxed via epic

## 2022-02-19 ENCOUNTER — Telehealth: Payer: Medicare PPO

## 2022-02-20 ENCOUNTER — Encounter (HOSPITAL_COMMUNITY): Admission: RE | Admit: 2022-02-20 | Payer: Medicare PPO | Source: Ambulatory Visit

## 2022-03-02 ENCOUNTER — Ambulatory Visit: Payer: Medicare PPO | Admitting: Radiology

## 2022-03-03 NOTE — Telephone Encounter (Signed)
Pt states surgery has been rescheduled and is requesting call back to discuss pre-op appt and labs. Please advise.

## 2022-03-04 NOTE — Telephone Encounter (Signed)
I have attempted to contact this patient by phone, but line is busy. I will continue to try later.

## 2022-03-09 ENCOUNTER — Telehealth: Payer: Self-pay

## 2022-03-09 NOTE — Telephone Encounter (Signed)
Spoke with patient who was returning our call and she is agreeable to do a tele visit on 10/17 at  10 am. Med rec and consent are done.

## 2022-03-09 NOTE — Telephone Encounter (Signed)
2nd attempt to reach pt regarding surgical clearance and the need for a tele visit.  Cal could not be completed using the home phone number so I left a message on spouses cell phone for pt to call back and ask for the preop team.

## 2022-03-09 NOTE — Telephone Encounter (Signed)
Pt is returning call. Transferred to [place], [place].

## 2022-03-09 NOTE — Telephone Encounter (Signed)
  Patient Consent for Virtual Visit        [name] has provided verbal consent on 03/09/2022 for a virtual visit (video or telephone).   CONSENT FOR VIRTUAL VISIT FOR:  [name]  By participating in this virtual visit I agree to the following:  I hereby voluntarily request, consent and authorize [facility] and its employed or contracted physicians, physician assistants, nurse practitioners or other licensed health care professionals (the Practitioner), to provide me with telemedicine health care services (the "Services") as deemed necessary by the treating Practitioner. I acknowledge and consent to receive the Services by the Practitioner via telemedicine. I understand that the telemedicine visit will involve communicating with the Practitioner through live audiovisual communication technology and the disclosure of certain medical information by electronic transmission. I acknowledge that I have been given the opportunity to request an in-person assessment or other available alternative prior to the telemedicine visit and am voluntarily participating in the telemedicine visit.  I understand that I have the right to withhold or withdraw my consent to the use of telemedicine in the course of my care at any time, without affecting my right to future care or treatment, and that the Practitioner or I may terminate the telemedicine visit at any time. I understand that I have the right to inspect all information obtained and/or recorded in the course of the telemedicine visit and may receive copies of available information for a reasonable fee.  I understand that some of the potential risks of receiving the Services via telemedicine include:  Delay or interruption in medical evaluation due to technological equipment failure or disruption; Information transmitted may not be sufficient (e.g. poor resolution of images) to allow for appropriate medical decision making by the  Practitioner; and/or  In rare instances, security protocols could fail, causing a breach of personal health information.  Furthermore, I acknowledge that it is my responsibility to provide information about my medical history, conditions and care that is complete and accurate to the best of my ability. I acknowledge that Practitioner's advice, recommendations, and/or decision may be based on factors not within their control, such as incomplete or inaccurate data provided by me or distortions of diagnostic images or specimens that may result from electronic transmissions. I understand that the practice of medicine is not an exact science and that Practitioner makes no warranties or guarantees regarding treatment outcomes. I acknowledge that a copy of this consent can be made available to me via my patient portal ([facility]), or I can request a printed copy by calling the office of [facility].    I understand that my insurance will be billed for this visit.   I have read or had this consent read to me. I understand the contents of this consent, which adequately explains the benefits and risks of the Services being provided via telemedicine.  I have been provided ample opportunity to ask questions regarding this consent and the Services and have had my questions answered to my satisfaction. I give my informed consent for the services to be provided through the use of telemedicine in my medical care

## 2022-03-24 ENCOUNTER — Ambulatory Visit: Payer: Medicare PPO | Attending: Cardiovascular Disease | Admitting: Nurse Practitioner

## 2022-03-24 ENCOUNTER — Other Ambulatory Visit (HOSPITAL_BASED_OUTPATIENT_CLINIC_OR_DEPARTMENT_OTHER): Payer: Self-pay

## 2022-03-24 DIAGNOSIS — Z01810 Encounter for preprocedural cardiovascular examination: Secondary | ICD-10-CM

## 2022-03-24 MED ORDER — COMIRNATY 30 MCG/0.3ML IM SUSY
PREFILLED_SYRINGE | INTRAMUSCULAR | 0 refills | Status: DC
  Filled 2022-03-24: qty 0.3, 1d supply, fill #0

## 2022-03-24 NOTE — Progress Notes (Signed)
Virtual Visit via Telephone Note   Because of [name]'s co-morbid illnesses, she is at least at moderate risk for complications without adequate follow up.  This format is felt to be most appropriate for this patient at this time.  The patient did not have access to video technology/had technical difficulties with video requiring transitioning to audio format only (telephone).  All issues noted in this document were discussed and addressed.  No physical exam could be performed with this format.  Please refer to the patient's chart for her consent to telehealth for [facility].  Evaluation Performed:  Preoperative cardiovascular risk assessment _____________   Date:  03/24/2022   Patient ID:  [name], DOB July 07, 1945, MRN [id number] Patient Location:  Home Provider location:   Office  Primary Care Provider:  Leonard Downing, MD Primary Cardiologist:  Mertie Moores, MD  Chief Complaint / Patient Profile   76 y.o. y/o female with a h/o paroxysmal atrial fibrillation, hypertension, hyperlipidemia, and obesity who is pending right reverse shoulder arthroplasty with Dr. Justice Britain of [facility] and presents today for telephonic preoperative cardiovascular risk assessment.  Past Medical History    Past Medical History:  Diagnosis Date   Abnormal Pap smear    Rare ASCUS-H   Arthritis    Atrial fibrillation ([place])    Cancer (HCC)    melanoma rt leg   Chest pain    Diverticulitis    DVT (deep venous thrombosis) (HCC)    GERD (gastroesophageal reflux disease)    Headache    HTN (hypertension) 07/02/2015   Hypercholesteremia    Hyperglycemia 02/04/2011   Hyperlipidemia 02/04/2011   Hypertension    per pt no htn but needs meds for other reason   Hypokalemia 05/17/2017   Injury of right popliteal artery 05/17/2017   Knee dislocation 05/17/2017   Leg edema 02/04/2011   Leg swelling 02/02/2011   Leukocytosis 05/17/2017   MRSA (methicillin  resistant Staphylococcus aureus)    R leg   Obesity    PAF (paroxysmal atrial fibrillation) ([place]) 02/02/2011   Perforated sigmoid colon ([place])    from diverticulitis   Popliteal artery injury 05/17/2017   Pre-diabetes    Past Surgical History:  Procedure Laterality Date   ABDOMINAL AORTOGRAM W/LOWER EXTREMITY Bilateral 02/19/2020   Procedure: ABDOMINAL AORTOGRAM W/LOWER EXTREMITY;  Surgeon: Waynetta Sandy, MD;  Location: [facility];  Service: Cardiovascular;  Laterality: Bilateral;  Rt.  lower  leg   ABDOMINAL HYSTERECTOMY     broken finger     BYPASS GRAFT POPLITEAL TO POPLITEAL Right 05/17/2017   Procedure: BYPASS GRAFT ABOVE KNEE POPLITEAL TO BELOW KNEE POPLITEAL USING NONREVERSED RIGHT GREATER SAPHENOUS VEIN;  Surgeon: Waynetta Sandy, MD;  Location: [place];  Service: Vascular;  Laterality: Right;   CARDIAC CATHETERIZATION  07/11/2010   Est. EF of 60-65% -- Smooth and normal coronary arteries  -- Normal LV systolic function    CATARACT EXTRACTION, BILATERAL     08-12-17, 08-26-17   CHOLECYSTECTOMY     CYSTOCELE REPAIR  07/22/2010   Vault suspension, cystocele repair, graft and cystoscopy   FINGER SURGERY     MELANOMA EXCISION     right leg 3/12   NASAL SINUS SURGERY     NEUROMA SURGERY     rt foot   TONSILLECTOMY     TOTAL HIP ARTHROPLASTY Left 01/28/2021   Procedure: TOTAL HIP ARTHROPLASTY ANTERIOR APPROACH;  Surgeon: Paralee Cancel, MD;  Location: [facility];  Service:  Orthopedics;  Laterality: Left;   TOTAL KNEE ARTHROPLASTY  06/20/2009   left knee   TOTAL KNEE ARTHROPLASTY  12/27/2008   right knee   TOTAL VAGINAL HYSTERECTOMY  07/22/2010   with bilateral salpingo-oophorectomy by Dr. Joan Flores   VEIN HARVEST Right 05/17/2017   Procedure: [place];  Surgeon: Waynetta Sandy, MD;  Location: [place];  Service: Vascular;  Laterality: Right;   WISDOM TOOTH EXTRACTION Right 08/14/2021   Mikey Bussing DDS     Allergies  Allergies  Allergen Reactions   Sulfa Antibiotics Other (See Comments)    Turned eyes yellow    Sulfonamide Derivatives Rash    Turned eyes yellow   Milk-Related Compounds Diarrhea   Atenolol Cough   Erythromycin Nausea Only   Plavix [Clopidogrel Bisulfate] Cough    History of Present Illness    [name] is a 76 y.o. female who presents via audio/video conferencing for a telehealth visit today.  Pt was last seen in cardiology clinic on 07/08/2021 by Dr. Acie Fredrickson.  At that time Khristina Janota was doing well. The patient is now pending procedure as outlined above. Since her last visit, she has been stable from a cardiac standpoint. She denies chest pain, palpitations, dyspnea, pnd, orthopnea, n, v, dizziness, syncope, edema, weight gain, or early satiety. All other systems reviewed and are otherwise negative except as noted above.   Home Medications    Prior to Admission medications   Medication Sig Start Date End Date Taking? Authorizing Provider  amoxicillin (AMOXIL) 500 MG capsule Take 2,000 mg by mouth See admin instructions. Take 2000 mg by mouth 1 hour prior to dental appointment    [provider]  Ascorbic Acid (VITAMIN C) 1000 MG tablet Take 1,000 mg by mouth daily.    [provider]  aspirin EC 81 MG tablet Take 81 mg by mouth daily.     [provider]  Calcium Carb-Cholecalciferol (CALCIUM + D3 PO) Take 1 tablet by mouth daily.    [provider]  chlorpheniramine-HYDROcodone 10-8 MG/5ML hydrocodone 10 mg-chlorpheniramine 8 mg/5 mL oral susp extend.rel 12hr  TAKE 5 MLS BY MOUTH EVERY 12 HOURS AS    [provider]  conjugated estrogens (PREMARIN) vaginal cream 1/2 gram vaginally twice weekly 08/06/20   Karma Ganja, NP  COVID-19 mRNA bivalent vaccine, Pfizer, (PFIZER COVID-19 VAC BIVALENT) injection Inject into the muscle. 03/06/21   Carlyle Basques, MD  diltiazem (CARDIZEM CD) 240 MG 24 hr capsule TAKE 1  CAPSULE (240 MG TOTAL) BY MOUTH DAILY. 08/05/21   Nahser, Wonda Cheng, MD  docusate sodium (COLACE) 100 MG capsule Take 1 capsule (100 mg total) by mouth 2 (two) times daily. 01/29/21   Costella Hatcher R, PA-C  ELIQUIS 5 MG TABS tablet TAKE 1 TABLET BY MOUTH 2 TIMES DAILY. 11/24/21   Nahser, Wonda Cheng, MD  famotidine (PEPCID) 20 MG tablet Take 20 mg by mouth daily.    [provider]  fluticasone (FLONASE) 50 MCG/ACT nasal spray Place 1-2 sprays into both nostrils at bedtime as needed for allergies.    [provider]  loratadine (CLARITIN) 10 MG tablet Take 10 mg by mouth daily.    [provider]  Multiple Vitamin (MULTIVITAMIN WITH MINERALS) TABS tablet Take 1 tablet by mouth daily.    [provider]  PATADAY 0.2 % SOLN Place 1 drop into both eyes daily.  04/27/16   [provider]  polyethylene glycol (MIRALAX / GLYCOLAX) 17 g  packet Take 17 g by mouth daily as needed for mild constipation. 01/29/21   Irving Copas, PA-C  potassium chloride SA (KLOR-CON M) 20 MEQ tablet Take 1 tablet (20 mEq total) by mouth daily. 07/08/21   Nahser, Wonda Cheng, MD  propranolol (INDERAL) 10 MG tablet Take 1 tablet (10 mg total) by mouth 4 (four) times daily as needed (palpitations or fast heart rate). 07/09/20   Nahser, Wonda Cheng, MD  psyllium (REGULOID) 0.52 g capsule Take 2.6 g by mouth 4 (four) times daily.    [provider]  rosuvastatin (CRESTOR) 10 MG tablet TAKE 1 TABLET (10 MG TOTAL) BY MOUTH DAILY. 01/23/22   Nahser, Wonda Cheng, MD  triamterene-hydrochlorothiazide (MAXZIDE) 75-50 MG tablet Take 1 tablet by mouth daily.    [provider]  potassium chloride (KLOR-CON) 10 MEQ tablet TAKE 1 TABLET BY MOUTH DAILY. Patient not taking: Reported on 02/16/2020 09/21/19 03/23/20  Nahser, Wonda Cheng, MD    Physical Exam    Vital Signs:  Deedee Lybarger does not have vital signs available for review today.  Given telephonic nature of communication, physical  exam is limited. AAOx3. NAD. Normal affect.  Speech and respirations are unlabored.  Accessory Clinical Findings    None  Assessment & Plan    1.  Preoperative Cardiovascular Risk Assessment:  According to the Revised Cardiac Risk Index (RCRI), her Perioperative Risk of Major Cardiac Event is (%): 0.4. Her Functional Capacity in METs is: 7.99 according to the [facility] Activity Status Index (DASI). Therefore, based on ACC/AHA guidelines, patient would be at acceptable risk for the planned procedure without further cardiovascular testing.  The patient was advised that if she develops new symptoms prior to surgery to contact our office to arrange for a follow-up visit, and she verbalized understanding.  Per office protocol, patient can hold Eliquis for 2 days prior to procedure. Additionally, if necessary, she may hold aspirin for 5 to 7 days prior to procedure. She should resume anticoagulation as soon as safely possible.   A copy of this note will be routed to requesting surgeon.  Time:   Today, I have spent 13 minutes with the patient with telehealth technology discussing medical history, symptoms, and management plan.     [name] Sciara, NP  03/24/2022, 10:16 AM

## 2022-03-25 NOTE — Patient Instructions (Signed)
SURGICAL WAITING ROOM VISITATION Patients having surgery or a procedure may have no more than 2 support people in the waiting area - these visitors may rotate in the visitor waiting room.   Children under the age of 71 must have an adult with them who is not the patient. If the patient needs to stay at the hospital during part of their recovery, the visitor guidelines for inpatient rooms apply.  PRE-OP VISITATION  Pre-op nurse will coordinate an appropriate time for 1 support person to accompany the patient in pre-op.  This support person may not rotate.  This visitor will be contacted when the time is appropriate for the visitor to come back in the pre-op area.  Please refer to the [facility] website for the visitor guidelines for Inpatients (after your surgery is over and you are in a regular room).  You are not required to quarantine at this time prior to your surgery. However, you must do this: Hand Hygiene often Do NOT share personal items Notify your provider if you are in close contact with someone who has COVID or you develop fever 100.4 or greater, new onset of sneezing, cough, sore throat, shortness of breath or body aches.   If you received a COVID test during your pre-op visit  it is requested that you wear a mask when out in public, stay away from anyone that may not be feeling well and notify your surgeon if you develop symptoms. If you test positive for Covid or have been in contact with anyone that has tested positive in the last 10 days please notify you surgeon.       Your procedure is scheduled on:  Thursday   October 26-2023  Report to [facility] Main Entrance.  Report to admitting at: 05:15  am  +++++Call this number if you have any questions or problems the morning of surgery [phone]  Do not eat food :After Midnight the night prior to your surgery/procedure.  After Midnight you may have the following liquids until   04:30 AM DAY OF SURGERY  Clear  Liquid Diet Water Black Coffee (sugar ok, NO MILK/CREAM OR CREAMERS)  Tea (sugar ok, NO MILK/CREAM OR CREAMERS) regular and decaf                             Plain Jell-O  with no fruit (NO RED)                                           Fruit ices (not with fruit pulp, NO RED)                                     Popsicles (NO RED)                                                                  Juice: apple, WHITE grape, WHITE cranberry Sports drinks like Gatorade or Powerade (NO RED)  The day of surgery:  Drink ONE (1) Pre-Surgery G2 at   04:30  AM the morning of surgery. Drink in one sitting. Do not sip.  This drink was given to you during your hospital pre-op appointment visit. Nothing else to drink after completing the Pre-Surgery G2 : No candy, chewing gum or throat lozenges.    FOLLOW  ANY ADDITIONAL PRE OP INSTRUCTIONS YOU RECEIVED FROM YOUR SURGEON'S OFFICE!!!   Oral Hygiene is also important to reduce your risk of infection.        Remember - BRUSH YOUR TEETH THE MORNING OF SURGERY WITH YOUR REGULAR TOOTHPASTE   Take ONLY these medicines the morning of surgery with A SIP OF WATER: Diltiazem, Famotidine and you may take Propranolol (Inderal) if needed.    You may not have any metal on your body including hair pins, jewelry, and body piercing  Do not wear make-up, lotions, powders, perfumes  or deodorant  Do not wear nail polish including gel and S&S, artificial / acrylic nails, or any other type of covering on natural nails including finger and toenails. If you have artificial nails, gel coating, etc., that needs to be removed by a nail salon, Please have this removed prior to surgery. Not doing so may mean that your surgery could be cancelled or delayed if the Surgeon or anesthesia staff feels like they are unable to monitor you safely.   Do not shave 48 hours prior to surgery to avoid nicks in your skin which may contribute to postoperative infections.     Contacts, Hearing Aids, dentures or bridgework may not be worn into surgery.   DO NOT [place]. PHARMACY WILL DISPENSE MEDICATIONS LISTED ON YOUR MEDICATION LIST TO YOU DURING YOUR ADMISSION [name]   Patients discharged on the day of surgery will not be allowed to drive home.  Someone NEEDS to stay with you for the first 24 hours after anesthesia.  Special Instructions: Bring a copy of your healthcare power of attorney and living will documents the day of surgery, if you wish to have them scanned into your [facility] Records- EPIC  Please read over the following fact sheets you were given: IF YOU HAVE QUESTIONS ABOUT YOUR PRE-OP INSTRUCTIONS, PLEASE CALL [phone]  ([place])   [place] - Preparing for Surgery Before surgery, you can play an important role.  Because skin is not sterile, your skin needs to be as free of germs as possible.  You can reduce the number of germs on your skin by washing with CHG (chlorahexidine gluconate) soap before surgery.  CHG is an antiseptic cleaner which kills germs and bonds with the skin to continue killing germs even after washing. Please DO NOT use if you have an allergy to CHG or antibacterial soaps.  If your skin becomes reddened/irritated stop using the CHG and inform your nurse when you arrive at Short Stay. Do not shave (including legs and underarms) for at least 48 hours prior to the first CHG shower.  You may shave your face/neck.  Please follow these instructions carefully:  1.  Shower with CHG Soap the night before surgery and the  morning of surgery.  2.  If you choose to wash your hair, wash your hair first as usual with your normal  shampoo.  3.  After you shampoo, rinse your hair and body thoroughly to remove the shampoo.  4.  Use CHG as you would any other liquid soap.  You can apply chg directly to the skin and wash.  Gently with a scrungie or clean  washcloth.  5.  Apply the CHG Soap to your body ONLY FROM THE NECK DOWN.   Do not use on face/ open                           Wound or open sores. Avoid contact with eyes, ears mouth and genitals (private parts).                       Wash face,  Genitals (private parts) with your normal soap.             6.  Wash thoroughly, paying special attention to the area where your  surgery  will be performed.  7.  Thoroughly rinse your body with warm water from the neck down.  8.  DO NOT shower/wash with your normal soap after using and rinsing off the CHG Soap.            9.  Pat yourself dry with a clean towel.            10.  Wear clean pajamas.            11.  Place clean sheets on your bed the night of your first shower and do not  sleep with pets.  ON THE DAY OF SURGERY : Do not apply any lotions/deodorants the morning of surgery.  Please wear clean clothes to the hospital/surgery center.    Preparing for Total Shoulder Arthroplasty ================================================================= Please follow these instructions carefully, in addition to any other special Bathing information that was explained to you at the Presurgical Appointment:  BENZOYL PEROXIDE 5% GEL: Used to kill bacteria on the skin which could cause an infection at the surgery site.   Please do not use if you have an allergy to benzoyl peroxide. If your skin becomes reddened/irritated stop using the benzoyl peroxide and inform your Doctor.   Starting two days before surgery, apply as follows:  1. Apply benzoyl peroxide gel in the morning and at night. Apply after taking a shower. If you are not taking a shower, clean entire shoulder front, back, and side, along with the armpit with a clean wet washcloth.  2. Place a quarter-sized dollop of the gel on your SHOULDER and rub in thoroughly, making sure to cover the front, back, and side of your shoulder, along with the armpit.   2 Days prior to Surgery First Dose   _______ Morning Second Dose  _______ Night  Day Before Surgery First Dose  ______ Morning  On the night before surgery, wash your entire body (except hair, face and private areas) with CHG Soap. THEN, rub in the LAST application of the Benzoyl Peroxide Gel on your shoulder.   3. On the Morning of Surgery wash your BODY AGAIN with CHG Soap (except hair, face and private areas)  4. DO NOT USE THE BENZOYL PEROXIDE GEL ON THE DAY OF YOUR SURGERY          FAILURE TO FOLLOW THESE INSTRUCTIONS MAY RESULT IN THE CANCELLATION OF YOUR SURGERY  PATIENT SIGNATURE_________________________________  NURSE SIGNATURE__________________________________  ________________________________________________________________________        Adam Phenix    An incentive spirometer is a tool that can help keep your lungs clear and active. This tool measures how  well you are filling your lungs with each breath. Taking long deep breaths may help reverse or decrease the chance of developing breathing (pulmonary) problems (especially infection) following: A long period of time when you are unable to move or be active. BEFORE THE PROCEDURE  If the spirometer includes an indicator to show your best effort, your nurse or respiratory therapist will set it to a desired goal. If possible, sit up straight or lean slightly forward. Try not to slouch. Hold the incentive spirometer in an upright position. INSTRUCTIONS FOR USE  Sit on the edge of your bed if possible, or sit up as far as you can in bed or on a chair. Hold the incentive spirometer in an upright position. Breathe out normally. Place the mouthpiece in your mouth and seal your lips tightly around it. Breathe in slowly and as deeply as possible, raising the piston or the ball toward the top of the column. Hold your breath for 3-5 seconds or for as long as possible. Allow the piston or ball to fall to the bottom of the column. Remove the  mouthpiece from your mouth and breathe out normally. Rest for a few seconds and repeat Steps 1 through 7 at least 10 times every 1-2 hours when you are awake. Take your time and take a few normal breaths between deep breaths. The spirometer may include an indicator to show your best effort. Use the indicator as a goal to work toward during each repetition. After each set of 10 deep breaths, practice coughing to be sure your lungs are clear. If you have an incision (the cut made at the time of surgery), support your incision when coughing by placing a pillow or rolled up towels firmly against it. Once you are able to get out of bed, walk around indoors and cough well. You may stop using the incentive spirometer when instructed by your caregiver.  RISKS AND COMPLICATIONS Take your time so you do not get dizzy or light-headed. If you are in pain, you may need to take or ask for pain medication before doing incentive spirometry. It is harder to take a deep breath if you are having pain. AFTER USE Rest and breathe slowly and easily. It can be helpful to keep track of a log of your progress. Your caregiver can provide you with a simple table to help with this. If you are using the spirometer at home, follow these instructions: [place] IF:  You are having difficultly using the spirometer. You have trouble using the spirometer as often as instructed. Your pain medication is not giving enough relief while using the spirometer. You develop fever of 100.5 F (38.1 C) or higher.                                                                                                    SEEK IMMEDIATE MEDICAL CARE IF:  You cough up bloody sputum that had not been present before. You develop fever of 102 F (38.9 C) or greater. You develop worsening pain at or near the incision site. MAKE SURE  YOU:  Understand these instructions. Will watch your condition. Will get help right away if you are not doing  well or get worse. Document Released: 10/05/2006 Document Revised: 08/17/2011 Document Reviewed: 12/06/2006 [facility] Patient Information 2014 [place], [place].

## 2022-03-25 NOTE — Progress Notes (Signed)
COVID Vaccine received:  '[]'$  No '[x]'$  Yes Date of any COVID positive Test in last 90 days:  PCP -  Claris Gower, MD  Cardiologist - Mertie Moores, MD Diona Browner, NP- clearance 03-24-22 Epic Note  Chest x-ray - n/a EKG -  07-08-2021  Epic Stress Test - 2012 Epic ECHO - 07-06-2018  Epic Cardiac Cath - 07-11-2010  Epic  Pacemaker/ICD device     '[]'$  N/A Spinal Cord Stimulator:'[]'$  No '[]'$  Yes      (Remind patient to bring remote DOS) Other Implants:   History of Sleep Apnea? '[x]'$  No '[]'$  Yes   Sleep Study Date:   CPAP used?- '[x]'$  No '[]'$  Yes  (Instruct to bring their mask & Tubing)  Does the patient monitor blood sugar? '[x]'$  No '[]'$  Yes  '[]'$  N/A Checks Blood Sugar _0  times a day  Blood Thinner Instructions:Eliquis - hold x 2 days( last dose- Monday 03-30-2022 Aspirin Instructions: hold 5 days if needed.   ERAS Protocol Ordered: '[]'$  No  '[]'$  Yes PRE-SURGERY '[]'$  ENSURE  '[]'$  G2   Comments:   Activity level: Can go up a flight of stairs and perform activities of daily living without stopping and without symptoms of chest pain or shortness of breath.____   Anesthesia review: A.fib, Hx DVT, PVD, Murmur, HTN  Patient denies shortness of breath, fever, cough and chest pain at PAT appointment.  Patient verbalized understanding and agreement to the Pre-Surgical Instructions that were given to them at this PAT appointment. Patient was also educated of the need to review these PAT instructions again prior to his/her surgery.I reviewed the appropriate phone numbers to call if they have any and questions or concerns.

## 2022-03-26 ENCOUNTER — Encounter (HOSPITAL_COMMUNITY): Payer: Self-pay

## 2022-03-26 ENCOUNTER — Other Ambulatory Visit: Payer: Self-pay

## 2022-03-26 ENCOUNTER — Encounter (HOSPITAL_COMMUNITY)
Admission: RE | Admit: 2022-03-26 | Discharge: 2022-03-26 | Disposition: A | Discharge status: Home / Self Care | Payer: Medicare PPO | Source: Ambulatory Visit | Attending: Orthopedic Surgery | Admitting: Orthopedic Surgery

## 2022-03-26 VITALS — BP 134/82 | HR 64 | Temp 98.9°F | Resp 16 | Ht 68.5 in | Wt 281.0 lb

## 2022-03-26 DIAGNOSIS — Z01812 Encounter for preprocedural laboratory examination: Secondary | ICD-10-CM | POA: Diagnosis present

## 2022-03-26 DIAGNOSIS — Z01818 Encounter for other preprocedural examination: Secondary | ICD-10-CM

## 2022-03-26 DIAGNOSIS — Z8614 Personal history of Methicillin resistant Staphylococcus aureus infection: Secondary | ICD-10-CM | POA: Diagnosis not present

## 2022-03-26 DIAGNOSIS — R7303 Prediabetes: Secondary | ICD-10-CM | POA: Diagnosis not present

## 2022-03-26 DIAGNOSIS — I10 Essential (primary) hypertension: Secondary | ICD-10-CM | POA: Insufficient documentation

## 2022-03-26 LAB — BASIC METABOLIC PANEL
Anion gap: 5 (ref 5–15)
BUN: 18 mg/dL (ref 8–23)
CO2: 30 mmol/L (ref 22–32)
Calcium: 9.3 mg/dL (ref 8.9–10.3)
Chloride: 105 mmol/L (ref 98–111)
Creatinine, Ser: 0.73 mg/dL (ref 0.44–1.00)
GFR, Estimated: >60 mL/min (ref >60)
Glucose, Bld: 114 mg/dL — ABNORMAL HIGH (ref 70–99)
Potassium: 3.8 mmol/L (ref 3.5–5.1)
Sodium: 140 mmol/L (ref 135–145)

## 2022-03-26 LAB — SURGICAL PCR SCREEN
MRSA, PCR: NEGATIVE
Staphylococcus aureus: NEGATIVE

## 2022-03-26 LAB — CBC
HCT: 45.4 % (ref 36.0–46.0)
Hemoglobin: 14.7 g/dL (ref 12.0–15.0)
MCH: 30.9 pg (ref 26.0–34.0)
MCHC: 32.4 g/dL (ref 30.0–36.0)
MCV: 95.6 fL (ref 80.0–100.0)
Platelets: 203 10*3/uL (ref 150–400)
RBC: 4.75 MIL/uL (ref 3.87–5.11)
RDW: 13.6 % (ref 11.5–15.5)
WBC: 6.6 10*3/uL (ref 4.0–10.5)
nRBC: 0 % (ref 0.0–0.2)

## 2022-03-26 LAB — HEMOGLOBIN A1C
Hgb A1c MFr Bld: 6.1 % — ABNORMAL HIGH (ref 4.8–5.6)
Mean Plasma Glucose: 128.37 mg/dL

## 2022-03-26 LAB — GLUCOSE, CAPILLARY: Glucose-Capillary: 151 mg/dL — ABNORMAL HIGH (ref 70–99)

## 2022-03-27 NOTE — Progress Notes (Signed)
Anesthesia Chart Review   Case: [id number] Date/Time: 04/02/22 0715   Procedure: REVERSE SHOULDER ARTHROPLASTY (Right: Shoulder) - 133mn   Anesthesia type: General   Pre-op diagnosis: Right shoulder rotator cuff tear arthropathy   Location: [name] 06 / [facility]   Surgeons: SJustice Britain MD       DISCUSSION:76 y.o. never smoker with h/o HTN, atrial fibrillation (Eliquis), right shoulder arthropathy scheduled for above procedure 04/02/2022 with Dr. KJustice Britain   Per cardiology preoperative evaluation 03/24/2022, "According to the Revised Cardiac Risk Index (RCRI), her Perioperative Risk of Major Cardiac Event is (%): 0.4. Her Functional Capacity in METs is: 7.99 according to the Duke Activity Status Index (DASI). Therefore, based on ACC/AHA guidelines, patient would be at acceptable risk for the planned procedure without further cardiovascular testing.   The patient was advised that if she develops new symptoms prior to surgery to contact our office to arrange for a follow-up visit, and she verbalized understanding.   Per office protocol, patient can hold Eliquis for 2 days prior to procedure. Additionally, if necessary, she may hold aspirin for 5 to 7 days prior to procedure. She should resume anticoagulation as soon as safely possible. "  Anticipate pt can proceed with planned procedure barring acute status change.   VS: BP 134/82 Comment: Right arm sitting  Pulse 64   Temp 37.2 C (Oral)   Resp 16   Ht 5' 8.5" (1.74 m)   Wt 127.5 kg   LMP 07/22/2010   SpO2 98%   BMI 42.10 kg/m   PROVIDERS: ELeonard Downing MD is PCP   Cardiologist - PMertie Moores MD LABS: Labs reviewed: Acceptable for surgery. (all labs ordered are listed, but only abnormal results are displayed)  Labs Reviewed  HEMOGLOBIN A1C - Abnormal; Notable for the following components:      Result Value   Hgb A1c MFr Bld 6.1 (*)    All other components within normal limits  BASIC METABOLIC PANEL - Abnormal;  Notable for the following components:   Glucose, Bld 114 (*)    All other components within normal limits  GLUCOSE, CAPILLARY - Abnormal; Notable for the following components:   Glucose-Capillary 151 (*)    All other components within normal limits  SURGICAL PCR SCREEN  CBC     IMAGES:   EKG:   CV: Echo 07/06/2018  1. The left ventricle appears to be normal in size, have normal wall  thickness, with normal systolic function of 502-58% Echo evidence of  normal diastolic filling patterns.   2. Possible small intra-atrial shunt.   3. Right ventricular systolic pressure is is normal.   4. The right ventricle is normal in size, has normal wall thickness and  normal systolic function.   5. Normal left atrial size.   6. Normal right atrial size.   7. The mitral valve normal in structure and function.   8. Normal tricuspid valve.   9. Aortic valve tricuspid.  10. There is mild calcification of the aortic valve.  11. There is mild thickening of the aortic valve.  12. The ascending aorta and aortic rootare normal is size and structure.  Past Medical History:  Diagnosis Date   Abnormal Pap smear    Rare ASCUS-H   Arthritis    Atrial fibrillation (HCC)    Cancer (HCC)    melanoma rt leg   Chest pain    Diverticulitis    DVT (deep venous thrombosis) (HCC)    GERD (gastroesophageal reflux  disease)    Headache    HTN (hypertension) 07/02/2015   Hypercholesteremia    Hyperglycemia 02/04/2011   Hyperlipidemia 02/04/2011   Hypertension    per pt no htn but needs meds for other reason   Hypokalemia 05/17/2017   Injury of right popliteal artery 05/17/2017   Knee dislocation 05/17/2017   Leg edema 02/04/2011   Leg swelling 02/02/2011   Leukocytosis 05/17/2017   MRSA (methicillin resistant Staphylococcus aureus)    R leg   Obesity    PAF (paroxysmal atrial fibrillation) ([place]) 02/02/2011   Perforated sigmoid colon ([place])    from diverticulitis   Popliteal artery injury  05/17/2017   Pre-diabetes     Past Surgical History:  Procedure Laterality Date   ABDOMINAL AORTOGRAM W/LOWER EXTREMITY Bilateral 02/19/2020   Procedure: ABDOMINAL AORTOGRAM W/LOWER EXTREMITY;  Surgeon: Waynetta Sandy, MD;  Location: [facility];  Service: Cardiovascular;  Laterality: Bilateral;  Rt.  lower  leg   ABDOMINAL HYSTERECTOMY     broken finger     BYPASS GRAFT POPLITEAL TO POPLITEAL Right 05/17/2017   Procedure: BYPASS GRAFT ABOVE KNEE POPLITEAL TO BELOW KNEE POPLITEAL USING NONREVERSED RIGHT GREATER SAPHENOUS VEIN;  Surgeon: Waynetta Sandy, MD;  Location: [facility];  Service: Vascular;  Laterality: Right;   CARDIAC CATHETERIZATION  07/11/2010   Est. EF of 60-65% -- Smooth and normal coronary arteries  -- Normal LV systolic function    CATARACT EXTRACTION, BILATERAL     08-12-17, 08-26-17   CHOLECYSTECTOMY     CYSTOCELE REPAIR  07/22/2010   Vault suspension, cystocele repair, graft and cystoscopy   FINGER SURGERY     MELANOMA EXCISION     right leg 3/12   NASAL SINUS SURGERY     NEUROMA SURGERY     rt foot   TONSILLECTOMY     TOTAL HIP ARTHROPLASTY Left 01/28/2021   Procedure: TOTAL HIP ARTHROPLASTY ANTERIOR APPROACH;  Surgeon: Paralee Cancel, MD;  Location: [facility];  Service: Orthopedics;  Laterality: Left;   TOTAL KNEE ARTHROPLASTY  06/20/2009   left knee   TOTAL KNEE ARTHROPLASTY  12/27/2008   right knee   TOTAL VAGINAL HYSTERECTOMY  07/22/2010   with bilateral salpingo-oophorectomy by Dr. Joan Flores   VEIN HARVEST Right 05/17/2017   Procedure: RIGHT GREATER SAPHENOUS VEIN HARVEST;  Surgeon: Waynetta Sandy, MD;  Location: [facility];  Service: Vascular;  Laterality: Right;   WISDOM TOOTH EXTRACTION Right 08/14/2021   Mikey Bussing DDS    MEDICATIONS:  amoxicillin (AMOXIL) 500 MG capsule   Ascorbic Acid (VITAMIN C) 1000 MG tablet   aspirin EC 81 MG tablet   Biotin 5000 MCG TABS   Calcium Carb-Cholecalciferol (CALCIUM + D3 PO)   conjugated  estrogens (PREMARIN) vaginal cream   COVID-19 mRNA bivalent vaccine, Pfizer, (PFIZER COVID-19 VAC BIVALENT) injection   COVID-19 mRNA vaccine 2023-2024 (COMIRNATY) syringe   diltiazem (CARDIZEM CD) 240 MG 24 hr capsule   docusate sodium (COLACE) 100 MG capsule   ELIQUIS 5 MG TABS tablet   famotidine (PEPCID) 20 MG tablet   fluconazole (DIFLUCAN) 150 MG tablet   fluticasone (FLONASE) 50 MCG/ACT nasal spray   loratadine (CLARITIN) 10 MG tablet   Multiple Vitamin (MULTIVITAMIN WITH MINERALS) TABS tablet   nystatin cream (MYCOSTATIN)   PATADAY 0.2 % SOLN   polyethylene glycol (MIRALAX / GLYCOLAX) 17 g packet   potassium chloride SA (KLOR-CON M) 20 MEQ tablet   propranolol (INDERAL) 10 MG tablet   psyllium (REGULOID) 0.52 g capsule  rosuvastatin (CRESTOR) 10 MG tablet   triamterene-hydrochlorothiazide (MAXZIDE) 75-50 MG tablet   No current facility-administered medications for this encounter.    Konrad Felix Ward, PA-C [facility] [phone]

## 2022-03-27 NOTE — Anesthesia Preprocedure Evaluation (Signed)
Anesthesia Evaluation  Patient identified by MRN, date of birth, ID band Patient awake    Reviewed: Allergy & Precautions, NPO status , Patient's Chart, lab work & pertinent test results, reviewed documented beta blocker date and time   Airway Mallampati: III  TM Distance: >3 FB Neck ROM: Full    Dental no notable dental hx. (+) Teeth Intact, Dental Advisory Given, Caps   Pulmonary neg pulmonary ROS,    Pulmonary exam normal breath sounds clear to auscultation       Cardiovascular hypertension, Pt. on medications and Pt. on home beta blockers + Peripheral Vascular Disease  Normal cardiovascular exam+ dysrhythmias Atrial Fibrillation  Rhythm:Regular Rate:Normal  Hx/o popliteal artery bypass due to injury   EKG 07/08/21 NSR, non specific ST-T wave changes  Echo 07/06/18 1. The left ventricle appears to be normal in size, have normal wall thickness, with normal systolic function of 29-79%. Echo evidence of normal diastolic filling patterns.  2. Possible small intra-atrial shunt.  3. Right ventricular systolic pressure is is normal.  4. The right ventricle is normal in size, has normal wall thickness and normal systolic function.  5. Normal left atrial size.  6. Normal right atrial size.  7. The mitral valve normal in structure and function.  8. Normal tricuspid valve.  9. Aortic valve tricuspid.  10. There is mild calcification of the aortic valve.  11. There is mild thickening of the aortic valve.  12. The ascending aorta and aortic rootare normal is size and structure.    Neuro/Psych  Headaches, negative psych ROS   GI/Hepatic Neg liver ROS, GERD  Medicated,  Endo/Other  Morbid obesityHyperlipidemia  Renal/GU negative Renal ROS  negative genitourinary   Musculoskeletal  (+) Arthritis , Osteoarthritis,  Right shoulder arthropathy   Abdominal (+) + obese,   Peds  Hematology Eliquis therapy- last dose 10/23    Anesthesia Other Findings   Reproductive/Obstetrics                          Anesthesia Physical Anesthesia Plan  ASA: 3  Anesthesia Plan: General   Post-op Pain Management: Regional block* and Minimal or no pain anticipated   Induction: Intravenous  PONV Risk Score and Plan: 4 or greater and Treatment may vary due to age or medical condition, Ondansetron and Dexamethasone  Airway Management Planned: Oral ETT  Additional Equipment: None  Intra-op Plan:   Post-operative Plan: Extubation in OR  Informed Consent: I have reviewed the patients History and Physical, chart, labs and discussed the procedure including the risks, benefits and alternatives for the proposed anesthesia with the patient or authorized representative who has indicated his/her understanding and acceptance.     Dental advisory given  Plan Discussed with: CRNA and Anesthesiologist  Anesthesia Plan Comments: (See PAT note 03/26/2022)      Anesthesia Quick Evaluation

## 2022-04-01 ENCOUNTER — Encounter (HOSPITAL_COMMUNITY): Payer: Self-pay | Admitting: Orthopedic Surgery

## 2022-04-02 ENCOUNTER — Encounter (HOSPITAL_COMMUNITY): Payer: Self-pay | Admitting: Orthopedic Surgery

## 2022-04-02 ENCOUNTER — Encounter (HOSPITAL_COMMUNITY)
Admission: RE | Disposition: A | Discharge status: Home / Self Care | Payer: Self-pay | Source: Home / Self Care | Attending: Orthopedic Surgery

## 2022-04-02 ENCOUNTER — Other Ambulatory Visit: Payer: Self-pay

## 2022-04-02 ENCOUNTER — Ambulatory Visit (HOSPITAL_COMMUNITY): Payer: Medicare PPO | Admitting: Physician Assistant

## 2022-04-02 ENCOUNTER — Ambulatory Visit (HOSPITAL_COMMUNITY)
Admission: RE | Admit: 2022-04-02 | Discharge: 2022-04-02 | Disposition: A | Discharge status: Home / Self Care | Payer: Medicare PPO | Attending: Orthopedic Surgery | Admitting: Orthopedic Surgery

## 2022-04-02 ENCOUNTER — Ambulatory Visit (HOSPITAL_BASED_OUTPATIENT_CLINIC_OR_DEPARTMENT_OTHER): Payer: Medicare PPO | Admitting: Certified Registered Nurse Anesthetist

## 2022-04-02 DIAGNOSIS — M19011 Primary osteoarthritis, right shoulder: Secondary | ICD-10-CM | POA: Insufficient documentation

## 2022-04-02 DIAGNOSIS — M12811 Other specific arthropathies, not elsewhere classified, right shoulder: Secondary | ICD-10-CM | POA: Diagnosis not present

## 2022-04-02 DIAGNOSIS — Z79899 Other long term (current) drug therapy: Secondary | ICD-10-CM | POA: Insufficient documentation

## 2022-04-02 DIAGNOSIS — M75101 Unspecified rotator cuff tear or rupture of right shoulder, not specified as traumatic: Secondary | ICD-10-CM | POA: Diagnosis present

## 2022-04-02 DIAGNOSIS — I739 Peripheral vascular disease, unspecified: Secondary | ICD-10-CM | POA: Diagnosis not present

## 2022-04-02 DIAGNOSIS — I10 Essential (primary) hypertension: Secondary | ICD-10-CM | POA: Insufficient documentation

## 2022-04-02 DIAGNOSIS — I4891 Unspecified atrial fibrillation: Secondary | ICD-10-CM

## 2022-04-02 DIAGNOSIS — I480 Paroxysmal atrial fibrillation: Secondary | ICD-10-CM | POA: Insufficient documentation

## 2022-04-02 DIAGNOSIS — E785 Hyperlipidemia, unspecified: Secondary | ICD-10-CM | POA: Insufficient documentation

## 2022-04-02 DIAGNOSIS — Z6841 Body Mass Index (BMI) 40.0 and over, adult: Secondary | ICD-10-CM | POA: Insufficient documentation

## 2022-04-02 DIAGNOSIS — Z7901 Long term (current) use of anticoagulants: Secondary | ICD-10-CM | POA: Insufficient documentation

## 2022-04-02 DIAGNOSIS — R7303 Prediabetes: Secondary | ICD-10-CM

## 2022-04-02 DIAGNOSIS — K219 Gastro-esophageal reflux disease without esophagitis: Secondary | ICD-10-CM | POA: Diagnosis not present

## 2022-04-02 HISTORY — PX: REVERSE SHOULDER ARTHROPLASTY: SHX5054

## 2022-04-02 LAB — GLUCOSE, CAPILLARY: Glucose-Capillary: 146 mg/dL — ABNORMAL HIGH (ref 70–99)

## 2022-04-02 SURGERY — ARTHROPLASTY, SHOULDER, TOTAL, REVERSE
Anesthesia: General | Site: Shoulder | Laterality: Right

## 2022-04-02 MED ORDER — ESMOLOL HCL 100 MG/10ML IV SOLN
INTRAVENOUS | Status: DC | PRN
  Administered 2022-04-02: 10 mg via INTRAVENOUS

## 2022-04-02 MED ORDER — ONDANSETRON HCL 4 MG PO TABS: 4.0000 mg | ORAL_TABLET | Freq: Three times a day (TID) | ORAL | 0 refills | Status: DC | PRN

## 2022-04-02 MED ORDER — CYCLOBENZAPRINE HCL 10 MG PO TABS: 10.0000 mg | ORAL_TABLET | Freq: Three times a day (TID) | ORAL | 1 refills | Status: DC | PRN

## 2022-04-02 MED ORDER — VANCOMYCIN HCL 1000 MG IV SOLR
INTRAVENOUS | Status: DC | PRN
  Administered 2022-04-02: 1000 mg

## 2022-04-02 MED ORDER — BUPIVACAINE LIPOSOME 1.3 % IJ SUSP
INTRAMUSCULAR | Status: DC | PRN
  Administered 2022-04-02: 10 mL via PERINEURAL

## 2022-04-02 MED ORDER — PHENYLEPHRINE 80 MCG/ML (10ML) SYRINGE FOR IV PUSH (FOR BLOOD PRESSURE SUPPORT)
PREFILLED_SYRINGE | INTRAVENOUS | Status: DC | PRN
  Administered 2022-04-02 (×2): 80 ug via INTRAVENOUS

## 2022-04-02 MED ORDER — STERILE WATER FOR IRRIGATION IR SOLN
Status: DC | PRN
  Administered 2022-04-02: 2000 mL

## 2022-04-02 MED ORDER — CHLORHEXIDINE GLUCONATE 0.12 % MT SOLN
15.0000 mL | Freq: Once | OROMUCOSAL | Status: AC
  Administered 2022-04-02: 15 mL via OROMUCOSAL

## 2022-04-02 MED ORDER — PROPOFOL 10 MG/ML IV BOLUS
INTRAVENOUS | Status: DC | PRN
  Administered 2022-04-02: 200 mg via INTRAVENOUS

## 2022-04-02 MED ORDER — ESMOLOL HCL 100 MG/10ML IV SOLN
INTRAVENOUS | Status: AC
  Filled 2022-04-02: qty 10

## 2022-04-02 MED ORDER — ALBUMIN HUMAN 5 % IV SOLN: INTRAVENOUS | Status: DC | PRN

## 2022-04-02 MED ORDER — PHENYLEPHRINE HCL-NACL 20-0.9 MG/250ML-% IV SOLN
INTRAVENOUS | Status: DC | PRN
  Administered 2022-04-02: 35 ug/min via INTRAVENOUS

## 2022-04-02 MED ORDER — LACTATED RINGERS IV SOLN: INTRAVENOUS | Status: DC

## 2022-04-02 MED ORDER — VANCOMYCIN HCL 1000 MG IV SOLR
INTRAVENOUS | Status: AC
  Filled 2022-04-02: qty 20

## 2022-04-02 MED ORDER — ONDANSETRON HCL 4 MG/2ML IJ SOLN: 4.0000 mg | Freq: Once | INTRAMUSCULAR | Status: DC | PRN

## 2022-04-02 MED ORDER — FENTANYL CITRATE PF 50 MCG/ML IJ SOSY
PREFILLED_SYRINGE | INTRAMUSCULAR | Status: AC
  Filled 2022-04-02: qty 1

## 2022-04-02 MED ORDER — SUGAMMADEX SODIUM 500 MG/5ML IV SOLN
INTRAVENOUS | Status: AC
  Filled 2022-04-02: qty 5

## 2022-04-02 MED ORDER — ROCURONIUM BROMIDE 10 MG/ML (PF) SYRINGE
PREFILLED_SYRINGE | INTRAVENOUS | Status: DC | PRN
  Administered 2022-04-02: 100 mg via INTRAVENOUS

## 2022-04-02 MED ORDER — FENTANYL CITRATE (PF) 100 MCG/2ML IJ SOLN
INTRAMUSCULAR | Status: AC
  Filled 2022-04-02: qty 2

## 2022-04-02 MED ORDER — TRANEXAMIC ACID 1000 MG/10ML IV SOLN: 1000.0000 mg | INTRAVENOUS | Status: DC

## 2022-04-02 MED ORDER — ONDANSETRON HCL 4 MG/2ML IJ SOLN
INTRAMUSCULAR | Status: DC | PRN
  Administered 2022-04-02: 4 mg via INTRAVENOUS

## 2022-04-02 MED ORDER — ORAL CARE MOUTH RINSE: 15.0000 mL | Freq: Once | OROMUCOSAL | Status: AC

## 2022-04-02 MED ORDER — OXYCODONE HCL 5 MG/5ML PO SOLN: 5.0000 mg | Freq: Once | ORAL | Status: DC | PRN

## 2022-04-02 MED ORDER — OXYCODONE-ACETAMINOPHEN 5-325 MG PO TABS: 1.0000 | ORAL_TABLET | ORAL | 0 refills | Status: DC | PRN

## 2022-04-02 MED ORDER — ONDANSETRON HCL 4 MG/2ML IJ SOLN
INTRAMUSCULAR | Status: AC
  Filled 2022-04-02: qty 2

## 2022-04-02 MED ORDER — BUPIVACAINE HCL (PF) 0.5 % IJ SOLN
INTRAMUSCULAR | Status: DC | PRN
  Administered 2022-04-02: 20 mL via PERINEURAL

## 2022-04-02 MED ORDER — LIDOCAINE HCL (PF) 2 % IJ SOLN
INTRAMUSCULAR | Status: AC
  Filled 2022-04-02: qty 5

## 2022-04-02 MED ORDER — LACTATED RINGERS IV BOLUS
500.0000 mL | Freq: Once | INTRAVENOUS | Status: AC
  Administered 2022-04-02: 500 mL via INTRAVENOUS

## 2022-04-02 MED ORDER — SUGAMMADEX SODIUM 200 MG/2ML IV SOLN
INTRAVENOUS | Status: DC | PRN
  Administered 2022-04-02: 500 mg via INTRAVENOUS

## 2022-04-02 MED ORDER — DEXAMETHASONE SODIUM PHOSPHATE 10 MG/ML IJ SOLN
INTRAMUSCULAR | Status: AC
  Filled 2022-04-02: qty 1

## 2022-04-02 MED ORDER — 0.9 % SODIUM CHLORIDE (POUR BTL) OPTIME
TOPICAL | Status: DC | PRN
  Administered 2022-04-02: 1000 mL

## 2022-04-02 MED ORDER — PROPOFOL 10 MG/ML IV BOLUS
INTRAVENOUS | Status: AC
  Filled 2022-04-02: qty 20

## 2022-04-02 MED ORDER — LACTATED RINGERS IV BOLUS
250.0000 mL | Freq: Once | INTRAVENOUS | Status: AC
  Administered 2022-04-02: 250 mL via INTRAVENOUS

## 2022-04-02 MED ORDER — FENTANYL CITRATE PF 50 MCG/ML IJ SOSY
25.0000 ug | PREFILLED_SYRINGE | INTRAMUSCULAR | Status: DC | PRN
  Administered 2022-04-02: 50 ug via INTRAVENOUS

## 2022-04-02 MED ORDER — OXYCODONE HCL 5 MG PO TABS: 5.0000 mg | ORAL_TABLET | Freq: Once | ORAL | Status: DC | PRN

## 2022-04-02 MED ORDER — FENTANYL CITRATE (PF) 100 MCG/2ML IJ SOLN
INTRAMUSCULAR | Status: DC | PRN
  Administered 2022-04-02 (×2): 50 ug via INTRAVENOUS

## 2022-04-02 MED ORDER — ROCURONIUM BROMIDE 10 MG/ML (PF) SYRINGE
PREFILLED_SYRINGE | INTRAVENOUS | Status: AC
  Filled 2022-04-02: qty 10

## 2022-04-02 MED ORDER — CEFAZOLIN IN SODIUM CHLORIDE 3-0.9 GM/100ML-% IV SOLN
3.0000 g | INTRAVENOUS | Status: AC
  Administered 2022-04-02: 3 g via INTRAVENOUS
  Filled 2022-04-02: qty 100

## 2022-04-02 MED ORDER — ALBUMIN HUMAN 5 % IV SOLN
INTRAVENOUS | Status: AC
  Filled 2022-04-02: qty 250

## 2022-04-02 MED ORDER — DEXAMETHASONE SODIUM PHOSPHATE 10 MG/ML IJ SOLN
INTRAMUSCULAR | Status: DC | PRN
  Administered 2022-04-02: 10 mg via INTRAVENOUS

## 2022-04-02 MED ORDER — TRANEXAMIC ACID-NACL 1000-0.7 MG/100ML-% IV SOLN
1000.0000 mg | INTRAVENOUS | Status: AC
  Administered 2022-04-02: 1000 mg via INTRAVENOUS
  Filled 2022-04-02: qty 100

## 2022-04-02 MED ORDER — LIDOCAINE 2% (20 MG/ML) 5 ML SYRINGE
INTRAMUSCULAR | Status: DC | PRN
  Administered 2022-04-02: 60 mg via INTRAVENOUS

## 2022-04-02 SURGICAL SUPPLY — 79 items
ADH SKN CLS APL DERMABOND .7 (GAUZE/BANDAGES/DRESSINGS) ×1
ADH SKN CLS LQ APL DERMABOND (GAUZE/BANDAGES/DRESSINGS) ×1
AID PSTN UNV HD RSTRNT DISP (MISCELLANEOUS) ×1
BAG COUNTER SPONGE SURGICOUNT (BAG) IMPLANT
BAG SPEC THK2 15X12 ZIP CLS (MISCELLANEOUS) ×1
BAG SPNG CNTER NS LX DISP (BAG) ×1
BAG ZIPLOCK 12X15 (MISCELLANEOUS) ×1 IMPLANT
BLADE SAW SGTL 83.5X18.5 (BLADE) ×1 IMPLANT
BNDG CMPR 5X4 CHSV STRCH STRL (GAUZE/BANDAGES/DRESSINGS) ×1
BNDG COHESIVE 4X5 TAN STRL LF (GAUZE/BANDAGES/DRESSINGS) ×1 IMPLANT
BSPLAT GLND +2X24 MDLR (Joint) ×1 IMPLANT
COOLER ICEMAN CLASSIC (MISCELLANEOUS) ×1 IMPLANT
COVER BACK TABLE 60X90IN (DRAPES) ×1 IMPLANT
COVER SURGICAL LIGHT HANDLE (MISCELLANEOUS) ×1 IMPLANT
CUP SUT UNIV REVERS 39 NEU (Shoulder) IMPLANT
DERMABOND ADVANCED .7 DNX12 (GAUZE/BANDAGES/DRESSINGS) ×1 IMPLANT
DERMABOND ADVANCED .7 DNX6 (GAUZE/BANDAGES/DRESSINGS) IMPLANT
DRAPE INCISE IOBAN 66X45 STRL (DRAPES) IMPLANT
DRAPE ORTHO SPLIT 77X108 STRL (DRAPES) ×2
DRAPE SHEET LG 3/4 BI-LAMINATE (DRAPES) ×1 IMPLANT
DRAPE SURG 17X11 SM STRL (DRAPES) ×1 IMPLANT
DRAPE SURG ORHT 6 SPLT 77X108 (DRAPES) ×2 IMPLANT
DRAPE TOP 10253 STERILE (DRAPES) ×1 IMPLANT
DRAPE U-SHAPE 47X51 STRL (DRAPES) ×1 IMPLANT
DRESSING AQUACEL AG SP 3.5X6 (GAUZE/BANDAGES/DRESSINGS) ×1 IMPLANT
DRSG AQUACEL AG ADV 3.5X 6 (GAUZE/BANDAGES/DRESSINGS) IMPLANT
DRSG AQUACEL AG ADV 3.5X10 (GAUZE/BANDAGES/DRESSINGS) IMPLANT
DRSG AQUACEL AG SP 3.5X6 (GAUZE/BANDAGES/DRESSINGS) ×1
DRSG TEGADERM 8X12 (GAUZE/BANDAGES/DRESSINGS) ×1 IMPLANT
DURAPREP 26ML APPLICATOR (WOUND CARE) ×1 IMPLANT
ELECT BLADE TIP CTD 4 INCH (ELECTRODE) ×1 IMPLANT
ELECT PENCIL ROCKER SW 15FT (MISCELLANEOUS) ×1 IMPLANT
ELECT REM PT RETURN 15FT ADLT (MISCELLANEOUS) ×1 IMPLANT
FACESHIELD WRAPAROUND (MASK) ×4 IMPLANT
FACESHIELD WRAPAROUND OR TEAM (MASK) ×4 IMPLANT
GLENOID UNI REV MOD 24 +2 LAT (Joint) IMPLANT
GLENOSPHERE 39+4 LAT/24 UNI RV (Joint) IMPLANT
GLOVE BIO SURGEON STRL SZ7.5 (GLOVE) ×1 IMPLANT
GLOVE BIO SURGEON STRL SZ8 (GLOVE) ×1 IMPLANT
GLOVE BIOGEL M STER SZ 6 (GLOVE) IMPLANT
GLOVE BIOGEL PI IND STRL 6 (GLOVE) IMPLANT
GLOVE SS BIOGEL STRL SZ 7 (GLOVE) ×1 IMPLANT
GLOVE SS BIOGEL STRL SZ 7.5 (GLOVE) ×1 IMPLANT
GOWN STRL SURGICAL XL XLNG (GOWN DISPOSABLE) ×2 IMPLANT
INSERT HUM REV 39 +6 (Insert) IMPLANT
KIT BASIN OR (CUSTOM PROCEDURE TRAY) ×1 IMPLANT
KIT TURNOVER KIT A (KITS) IMPLANT
MANIFOLD NEPTUNE II (INSTRUMENTS) ×1 IMPLANT
NDL TAPERED W/ NITINOL LOOP (MISCELLANEOUS) ×1 IMPLANT
NEEDLE TAPERED W/ NITINOL LOOP (MISCELLANEOUS) ×1 IMPLANT
NS IRRIG 1000ML POUR BTL (IV SOLUTION) ×1 IMPLANT
PACK SHOULDER (CUSTOM PROCEDURE TRAY) ×1 IMPLANT
PAD ARMBOARD 7.5X6 YLW CONV (MISCELLANEOUS) ×1 IMPLANT
PAD COLD SHLDR WRAP-ON (PAD) ×1 IMPLANT
PIN NITINOL TARGETER 2.8 (PIN) IMPLANT
PIN SET MODULAR GLENOID SYSTEM (PIN) IMPLANT
RESTRAINT HEAD UNIVERSAL NS (MISCELLANEOUS) ×1 IMPLANT
SCREW CENTRAL MOD 30MM (Screw) IMPLANT
SCREW PERI LOCK 5.5X32 (Screw) IMPLANT
SCREW PERIPHERAL 5.5X20 LOCK (Screw) IMPLANT
SCREW PERIPHERAL 5.5X28 LOCK (Screw) IMPLANT
SLING ARM FOAM STRAP LRG (SOFTGOODS) IMPLANT
SLING ARM FOAM STRAP MED (SOFTGOODS) IMPLANT
SPONGE T-LAP 4X18 ~~LOC~~+RFID (SPONGE) ×1 IMPLANT
STEM HUMERAL UNI REVERSE SZ10 (Stem) IMPLANT
SUCTION FRAZIER HANDLE 12FR (TUBING) ×1
SUCTION TUBE FRAZIER 12FR DISP (TUBING) ×1 IMPLANT
SUT FIBERWIRE #2 38 T-5 BLUE (SUTURE)
SUT MNCRL AB 3-0 PS2 18 (SUTURE) ×1 IMPLANT
SUT MON AB 2-0 CT1 36 (SUTURE) ×1 IMPLANT
SUT VIC AB 1 CT1 36 (SUTURE) ×1 IMPLANT
SUTURE FIBERWR #2 38 T-5 BLUE (SUTURE) IMPLANT
SUTURE TAPE 1.3 40 TPR END (SUTURE) ×2 IMPLANT
SUTURETAPE 1.3 40 TPR END (SUTURE) ×2
TOWEL OR 17X26 10 PK STRL BLUE (TOWEL DISPOSABLE) ×1 IMPLANT
TOWEL OR NON WOVEN STRL DISP B (DISPOSABLE) ×1 IMPLANT
TUBE SUCTION HIGH CAP CLEAR NV (SUCTIONS) ×1 IMPLANT
WATER STERILE IRR 1000ML POUR (IV SOLUTION) ×2 IMPLANT
YANKAUER SUCT BULB TIP 10FT TU (MISCELLANEOUS) IMPLANT

## 2022-04-02 NOTE — Discharge Instructions (Addendum)
Metta Clines. Supple, M.D., F.A.A.O.S. Orthopaedic Surgery Specializing in Arthroscopic and Reconstructive Surgery of the Shoulder [phone] [address] [place], [place] [address] - Fax [phone]   POST-OP TOTAL SHOULDER REPLACEMENT INSTRUCTIONS  Resume your Eloquis tomorrow  1. Follow up in the office for your first post-op appointment 10-14 days from the date of your surgery. If you do not already have a scheduled appointment, our office will contact you to schedule.  2. The bandage over your incision is waterproof. You may begin showering with this dressing on. You may leave this dressing on until first follow up appointment within 2 weeks. We prefer you leave this dressing in place until follow up however after 5-7 days if you are having itching or skin irritation and would like to remove it you may do so. Go slow and tug at the borders gently to break the bond the dressing has with the skin. At this point if there is no drainage it is okay to go without a bandage or you may cover it with a light guaze and tape. You can also expect significant bruising around your shoulder that will drift down your arm and into your chest wall. This is very normal and should resolve over several days.   3. Wear your sling/immobilizer at all times except to perform the exercises below or to occasionally let your arm dangle by your side to stretch your elbow. You also need to sleep in your sling immobilizer until instructed otherwise. It is ok to remove your sling if you are sitting in a controlled environment and allow your arm to rest in a position of comfort by your side or on your lap with pillows to give your neck and skin a break from the sling. You may remove it to allow arm to dangle by side to shower. If you are up walking around and when you go to sleep at night you need to wear it.  4. Range of motion to your elbow, wrist, and hand are encouraged 3-5 times daily. Exercise to your  hand and fingers helps to reduce swelling you may experience.   5. Prescriptions for a pain medication and a muscle relaxant are provided for you. It is recommended that if you are experiencing pain that you pain medication alone is not controlling, add the muscle relaxant along with the pain medication which can give additional pain relief. The first 1-2 days is generally the most severe of your pain and then should gradually decrease. As your pain lessens it is recommended that you decrease your use of the pain medications to an "as needed basis'" only and to always comply with the recommended dosages of the pain medications.  6. Pain medications can produce constipation along with their use. If you experience this, the use of an over the counter stool softener or laxative daily is recommended.   7. For additional questions or concerns, please do not hesitate to call the office. If after hours there is an answering service to forward your concerns to the physician on call.  8.Pain control following an exparel block  To help control your post-operative pain you received a nerve block  performed with Exparel which is a long acting anesthetic (numbing agent) which can provide pain relief and sensations of numbness (and relief of pain) in the operative shoulder and arm for up to 3 days. Sometimes it provides mixed relief, meaning you may still have numbness in certain areas of the arm but  can still be able to move  parts of that arm, hand, and fingers. We recommend that your prescribed pain medications  be used as needed. We do not feel it is necessary to "pre medicate" and "stay ahead" of pain.  Taking narcotic pain medications when you are not having any pain can lead to unnecessary and potentially dangerous side effects.    9. Use the ice machine as much as possible in the first 5-7 days from surgery, then you can wean its use to as needed. The ice typically needs to be replaced every 6 hours, instead  of ice you can actually freeze water bottles to put in the cooler and then fill water around them to avoid having to purchase ice. You can have spare water bottles freezing to allow you to rotate them once they have melted. Try to have a thin shirt or light cloth or towel under the ice wrap to protect your skin.   FOR ADDITIONAL INFO ON ICE MACHINE AND INSTRUCTIONS GO TO THE WEBSITE AT  [email or web address]  10.  We recommend that you avoid any dental work or cleaning in the first 3 months following your joint replacement. This is to help minimize the possibility of infection from the bacteria in your mouth that enters your bloodstream during dental work. We also recommend that you take an antibiotic prior to your dental work for the first year after your shoulder replacement to further help reduce that risk. Please simply contact our office for antibiotics to be sent to your pharmacy prior to dental work.  11. Dental Antibiotics:  We recommend waiting at least 3 months for any dental work even cleanings unless there is a IT consultant. We also recommend  prophylactic antibiotics for all dental procdeures  the first year following your joint replacement. In some exceptions we recommend them to be used lifelong. We will provide you with that prescription in follow up office visits, or you can call our office.  Exceptions are as follows:  1. History of prior total joint infection  2. Severely immunocompromised (Organ Transplant, cancer chemotherapy, Rheumatoid biologic meds such as Moca)  3. Poorly controlled diabetes (A1C &gt; 8.0, blood glucose over 200)   POST-OP EXERCISES  Pendulum Exercises  Perform pendulum exercises while standing and bending at the waist. Support your uninvolved arm on a table or chair and allow your operated arm to hang freely. Make sure to do these exercises passively - not using you shoulder muscles. These exercises  can be performed once your nerve block effects have worn off.  Repeat 20 times. Do 3 sessions per day.

## 2022-04-02 NOTE — Transfer of Care (Signed)
Immediate Anesthesia Transfer of Care Note  Patient: [name]  Procedure(s) Performed: REVERSE SHOULDER ARTHROPLASTY (Right: Shoulder)  Patient Location: PACU  Anesthesia Type:General  Level of Consciousness: alert , oriented and drowsy  Airway & Oxygen Therapy: Patient Spontanous Breathing and Patient connected to face mask oxygen  Post-op Assessment: Report given to RN and Post -op Vital signs reviewed and stable  Post vital signs: Reviewed and stable  Last Vitals:  Vitals Value Taken Time  BP 152/79 04/02/22 0924  Temp    Pulse 70 04/02/22 0924  Resp 21 04/02/22 0924  SpO2 100 % 04/02/22 0924  Vitals shown include unvalidated device data.  Last Pain:  Vitals:   04/02/22 0617  TempSrc:   PainSc: 1       Patients Stated Pain Goal: 0 (65/03/54 6568)  Complications: No notable events documented.

## 2022-04-02 NOTE — Anesthesia Procedure Notes (Addendum)
  Anesthesia Regional Block: Interscalene brachial plexus block   Pre-Anesthetic Checklist: , timeout performed,  Correct Patient, Correct Site, Correct Laterality,  Correct Procedure, Correct Position, site marked,  Risks and benefits discussed,  Surgical consent,  Pre-op evaluation,  At surgeon's request and post-op pain management  Laterality: Right  Prep: chloraprep       Needles:  Injection technique: Single-shot  Needle Type: Echogenic Stimulator Needle     Needle Length: 10cm  Needle Gauge: 21   Needle insertion depth: 6 cm   Additional Needles:   Procedures:,,,, ultrasound used (permanent image in chart),,    Narrative:  Start time: 04/02/2022 6:56 AM End time: 04/02/2022 7:01 AM Injection made incrementally with aspirations every 5 mL.  Performed by: Personally  Anesthesiologist: Josephine Igo, MD  Additional Notes: Timeout performed. Patient sedated. Relevant anatomy ID'd using [name]. Incremental 2-52m injection of LA with frequent aspiration. Patient tolerated procedure well.

## 2022-04-02 NOTE — Op Note (Signed)
04/02/2022  9:17 AM  PATIENT:   [name]  76 y.o. female  PRE-OPERATIVE DIAGNOSIS:  Right shoulder rotator cuff tear arthropathy  POST-OPERATIVE DIAGNOSIS: Same  PROCEDURE: Right shoulder reverse arthroplasty lysing a press-fit size 10 Arthrex stem with a neutral metaphysis, +3 constrained polyethylene insert, 39/+4 glenosphere and a small/+2 baseplate  SURGEON:  Janiyha Montufar, Metta Clines M.D.  ASSISTANTS: Jenetta Loges, PA-C  Jenetta Loges, PA-C was utilized as an Environmental consultant throughout this case, essential for help with positioning the patient, positioning extremity, tissue manipulation, implantation of the prosthesis, suture management, wound closure, and intraoperative decision-making.  ANESTHESIA:   General endotracheal and interscalene block with Exparel rel  EBL: 200 cc  SPECIMEN: None  Drains: None   PATIENT DISPOSITION:  PACU - hemodynamically stable.    PLAN OF CARE: Discharge to home after PACU  Brief history:  Patient is a 76 year old female well-known to our practice who has been followed for chronic and progressive increasing right shoulder pain related to severe rotator cuff tear arthropathy.  Due to her increasing functional limitations and failure to respond to prolonged attempts at conservative management, she is brought to the operating this time for planned right shoulder reverse arthroplasty.  Preoperatively, I counseled the patient regarding treatment options and risks versus benefits thereof.  Possible surgical complications were all reviewed including potential for bleeding, infection, neurovascular injury, persistent pain, loss of motion, anesthetic complication, failure of the implant, and possible need for additional surgery. They understand and accept and agrees with our planned procedure.   Procedure in detail:  After undergoing routine preop evaluation the patient received prophylactic antibiotics and interscalene block with Exparel was  established in the holding area by the anesthesia department.  Patient was subsequently placed spine on the operating table and underwent the smooth induction of a general endotracheal anesthesia.  Placed into the beachchair position and appropriately padded and protected.  The right shoulder girdle region was sterilely prepped and draped in standard fashion.  Timeout was called.  A deltopectoral approach was made to the right shoulder through an approximately 10 cm incision.  There were multiple varicosities in the subcutaneous fat which were electrocoagulated.  Dissection carried deeply to the deltopectoral interval which was developed from proximal to distal with the vein taken laterally.  Conjoined tendon mobilized and retracted medially.  Long head bicep tendon was then tenodesed at the upper border the pectoralis major tendon and the proximal segment was unroofed and excised.  The remnant of the rotator cuff was split superiorly from the apex of the bicipital groove to the base of the coracoid and the subscapularis was separated from the lesser tuberosity using electrocautery and the free margin was tagged with a pair of grasping suture tape sutures.  Capsular attachments were then divided from the anterior and inferior margins of the humeral neck and the humeral head was then delivered through the wound.  An extra medullary guide was then used to outline our proposed humeral head resection which we performed with an oscillating saw at approximately 20 degrees retroversion.  A metal cap was then placed over the cut proximal humeral surface.  The glenoid was then exposed and a circumferential labral resection was completed.  Guidepin was then directed into the center of the glenoid and the glenoid was then reamed with the central followed by the peripheral reamer to a stable subchondral bony bed.  All debris was removed.  Preparation completed with the central drill and tapped for a 30 mm lag screw.  Our  baseplate was assembled with vancomycin powder applied to the threads of the lag screw and it was then inserted with excellent purchase and fixation.  All of the peripheral locking screws were then placed using standard technique with excellent fixation.  A 39/+4 glenosphere was then impacted onto the baseplate and the central locking screw was placed.  We returned our attention back to the proximal humerus and the canal was opened by hand reaming we broached up to a size 10 and approximately 20 degrees retroversion.  A neutral metaphyseal reaming guide was then used to prepare the metaphysis.  A trial implant was placed trial reduction showed good motion good stability good soft tissue balance.  At this point the trial was removed.  The final implant was assembled.  The canal was irrigated cleaned and dried with vancomycin powder split liberally into the canal.  The final implant was seated with excellent purchase and fixation.  A series of trial reductions were then performed and ultimately felt that a +6 poly gave is the best motion stability and soft tissue balance.  A final +6 constrained liner was impacted onto the implant after was cleaned and dried and the final reduction showed excellent motion stability and soft tissue balance all much to our satisfaction.  The joint was then copiously irrigated.  Final hemostasis was obtained.  We confirmed good elasticity of the subscapularis which was repaired back to the eyelets on the collar of the implant using the previously placed suture tape sutures.  Vancomycin powder was then spread liberally throughout the deep soft tissue planes.  The deltopectoral interval was closed with a series of figure-of-eight number Vicryl sutures.  2-0 Monocryl used to the subcu layer and intracuticular 3 Monocryl for the skin followed by Dermabond and Aquacel dressing.  The right arm was placed into a sling.  The patient was awakened, extubated, and taken to the recovery room in  stable condition.  Marin Shutter MD   Contact # [phone]

## 2022-04-02 NOTE — H&P (Signed)
Rich Number    Chief Complaint: Right shoulder rotator cuff tear arthropathy HPI: The patient is a 76 y.o. female with chronic and progressive increasing right shoulder pain related to severe rotator cuff tear arthropathy.  Due to her increasing functional limitations and failure to respond to prolonged attempts at conservative management, she is brought to the operating room at this time for planned right shoulder reverse arthroplasty  Past Medical History:  Diagnosis Date   Abnormal Pap smear    Rare ASCUS-H   Arthritis    Atrial fibrillation ([place])    Cancer ([place])    melanoma rt leg   Chest pain    Diverticulitis    DVT (deep venous thrombosis) ([facility])    GERD (gastroesophageal reflux disease)    Headache    HTN (hypertension) 07/02/2015   Hypercholesteremia    Hyperglycemia 02/04/2011   Hyperlipidemia 02/04/2011   Hypertension    per pt no htn but needs meds for other reason   Hypokalemia 05/17/2017   Injury of right popliteal artery 05/17/2017   Knee dislocation 05/17/2017   Leg edema 02/04/2011   Leg swelling 02/02/2011   Leukocytosis 05/17/2017   MRSA (methicillin resistant Staphylococcus aureus)    R leg   Obesity    PAF (paroxysmal atrial fibrillation) ([place]) 02/02/2011   Perforated sigmoid colon ([place])    from diverticulitis   Popliteal artery injury 05/17/2017   Pre-diabetes       Past Surgical History:  Procedure Laterality Date   ABDOMINAL AORTOGRAM W/LOWER EXTREMITY Bilateral 02/19/2020   Procedure: ABDOMINAL AORTOGRAM W/LOWER EXTREMITY;  Surgeon: Waynetta Sandy, MD;  Location: [facility];  Service: Cardiovascular;  Laterality: Bilateral;  Rt.  lower  leg   ABDOMINAL HYSTERECTOMY     broken finger     BYPASS GRAFT POPLITEAL TO POPLITEAL Right 05/17/2017   Procedure: BYPASS GRAFT ABOVE KNEE POPLITEAL TO BELOW KNEE POPLITEAL USING NONREVERSED RIGHT GREATER SAPHENOUS VEIN;  Surgeon: Waynetta Sandy, MD;  Location: [facility];   Service: Vascular;  Laterality: Right;   CARDIAC CATHETERIZATION  07/11/2010   Est. EF of 60-65% -- Smooth and normal coronary arteries  -- Normal LV systolic function    CATARACT EXTRACTION, BILATERAL     08-12-17, 08-26-17   CHOLECYSTECTOMY     CYSTOCELE REPAIR  07/22/2010   Vault suspension, cystocele repair, graft and cystoscopy   FINGER SURGERY     MELANOMA EXCISION     right leg 3/12   NASAL SINUS SURGERY     NEUROMA SURGERY     rt foot   TONSILLECTOMY     TOTAL HIP ARTHROPLASTY Left 01/28/2021   Procedure: TOTAL HIP ARTHROPLASTY ANTERIOR APPROACH;  Surgeon: Paralee Cancel, MD;  Location: [facility];  Service: Orthopedics;  Laterality: Left;   TOTAL KNEE ARTHROPLASTY  06/20/2009   left knee   TOTAL KNEE ARTHROPLASTY  12/27/2008   right knee   TOTAL VAGINAL HYSTERECTOMY  07/22/2010   with bilateral salpingo-oophorectomy by Dr. Joan Flores   VEIN HARVEST Right 05/17/2017   Procedure: [place];  Surgeon: Waynetta Sandy, MD;  Location: [facility];  Service: Vascular;  Laterality: Right;   WISDOM TOOTH EXTRACTION Right 08/14/2021   Mikey Bussing DDS    Family History  Problem Relation Age of Onset   Atrial fibrillation Mother        has pacemaker   Diabetes Mother    Hypertension Mother    Emphysema Father    Other Brother  bulbar palsy   Breast cancer Neg Hx     Social History:  reports that she has never smoked. She has never used smokeless tobacco. She reports that she does not drink alcohol and does not use drugs.  BMI: Estimated body mass index is 42.12 kg/m as calculated from the following:   Height as of this encounter: 5' 8.5" (1.74 m).   Weight as of this encounter: 127.5 kg.  Lab Results  Component Value Date   ALBUMIN 3.9 01/20/2021   Diabetes: Patient does not have a diagnosis of diabetes. Lab Results  Component Value Date   HGBA1C 6.1 (H) 03/26/2022     Smoking Status:   reports that she has never smoked. She has  never used smokeless tobacco.     Medications Prior to Admission  Medication Sig Dispense Refill   Ascorbic Acid (VITAMIN C) 1000 MG tablet Take 1,000 mg by mouth daily.     aspirin EC 81 MG tablet Take 81 mg by mouth daily.      Biotin 5000 MCG TABS Take 5,000 mcg by mouth daily.     Calcium Carb-Cholecalciferol (CALCIUM + D3 PO) Take 1 tablet by mouth daily.     conjugated estrogens (PREMARIN) vaginal cream 1/2 gram vaginally twice weekly 30 g 1   diltiazem (CARDIZEM CD) 240 MG 24 hr capsule TAKE 1 CAPSULE (240 MG TOTAL) BY MOUTH DAILY. 90 capsule 3   docusate sodium (COLACE) 100 MG capsule Take 1 capsule (100 mg total) by mouth 2 (two) times daily. 10 capsule 0   ELIQUIS 5 MG TABS tablet TAKE 1 TABLET BY MOUTH 2 TIMES DAILY. 60 tablet 5   famotidine (PEPCID) 20 MG tablet Take 20 mg by mouth daily.     fluconazole (DIFLUCAN) 150 MG tablet Take 150 mg by mouth once a week.     fluticasone (FLONASE) 50 MCG/ACT nasal spray Place 1-2 sprays into both nostrils at bedtime as needed for allergies.     loratadine (CLARITIN) 10 MG tablet Take 10 mg by mouth daily.     Multiple Vitamin (MULTIVITAMIN WITH MINERALS) TABS tablet Take 1 tablet by mouth daily.     nystatin cream (MYCOSTATIN) Apply 1 Application topically daily as needed (yeast).     PATADAY 0.2 % SOLN Place 1 drop into both eyes daily.   4   polyethylene glycol (MIRALAX / GLYCOLAX) 17 g packet Take 17 g by mouth daily as needed for mild constipation. 14 each 0   potassium chloride SA (KLOR-CON M) 20 MEQ tablet Take 1 tablet (20 mEq total) by mouth daily. 90 tablet 3   psyllium (REGULOID) 0.52 g capsule Take 2.6 g by mouth 4 (four) times daily.     rosuvastatin (CRESTOR) 10 MG tablet TAKE 1 TABLET (10 MG TOTAL) BY MOUTH DAILY. 90 tablet 1   triamterene-hydrochlorothiazide (MAXZIDE) 75-50 MG tablet Take 1 tablet by mouth daily.     amoxicillin (AMOXIL) 500 MG capsule Take 2,000 mg by mouth See admin instructions. Take 2000 mg by mouth 1  hour prior to dental appointment     COVID-19 mRNA bivalent vaccine, Pfizer, (PFIZER COVID-19 VAC BIVALENT) injection Inject into the muscle. (Patient not taking: Reported on 03/24/2022) 0.3 mL 0   COVID-19 mRNA vaccine 2023-2024 (COMIRNATY) syringe Inject into the muscle. 0.3 mL 0   propranolol (INDERAL) 10 MG tablet Take 1 tablet (10 mg total) by mouth 4 (four) times daily as needed (palpitations or fast heart rate). 60 tablet 6  Physical Exam: Right shoulder demonstrates painful and guarded motion as noted at recent office visits.  She has globally decreased strength to manual muscle testing.  She is neurovascular intact in the right upper extremity.  Plain radiographs confirm changes consistent with chronic rotator cuff tear arthropathy.  Vitals  Temp:  [98.3 F (36.8 C)] 98.3 F (36.8 C) (10/26 0540) Pulse Rate:  [79] 79 (10/26 0540) Resp:  [18] 18 (10/26 0540) BP: (154)/(97) 154/97 (10/26 0540) SpO2:  [95 %] 95 % (10/26 0540) Weight:  [127.5 kg] 127.5 kg (10/26 0532)  Assessment/Plan  Impression: Right shoulder rotator cuff tear arthropathy  Plan of Action: Procedure(s): REVERSE SHOULDER ARTHROPLASTY  Jawuan Robb M Braxton Weisbecker 04/02/2022, 6:41 AM Contact # [phone]

## 2022-04-02 NOTE — Anesthesia Postprocedure Evaluation (Signed)
Anesthesia Post Note  Patient: [name]  Procedure(s) Performed: REVERSE SHOULDER ARTHROPLASTY (Right: Shoulder)     Patient location during evaluation: PACU Anesthesia Type: General Level of consciousness: awake and alert and oriented Pain management: pain level controlled Vital Signs Assessment: post-procedure vital signs reviewed and stable Respiratory status: spontaneous breathing, nonlabored ventilation and respiratory function stable Cardiovascular status: blood pressure returned to baseline and stable Postop Assessment: no apparent nausea or vomiting Anesthetic complications: no   No notable events documented.  Last Vitals:  Vitals:   04/02/22 1000 04/02/22 1015  BP: 126/73 (!) 128/58  Pulse: 64 64  Resp: 15 18  Temp:    SpO2: 99% 93%    Last Pain:  Vitals:   04/02/22 1004  TempSrc:   PainSc: 6                  Lavender Stanke A.

## 2022-04-02 NOTE — Evaluation (Signed)
Occupational Therapy Evaluation Patient Details Name: [name] MRN: [id number] DOB: 01-16-1946 Today's Date: 04/02/2022   History of Present Illness Patient is a 76 year old woman s/p Rt rTSA with a history of left THA, OA, Afib, melanoma, DVT, GERD, HTN, HLD, obesity and bilateral TKA.   Clinical Impression   [name] is a 76 year old woman s/p shoulder replacement without functional use of right dominant upper extremity secondary to effects of surgery and interscalene block and shoulder precautions. Therapist provided education and instruction to patient and spouse in regards to exercises, precautions, positioning, donning upper extremity clothing and bathing while maintaining shoulder precautions, ice and edema management via ice cooler and cuff and donning/doffing sling. Patient and spouse verbalized understanding and demonstrated as needed. Patient needed assistance to donn shirt, underwear, and pants. Therapist provided patient with instruction on compensatory strategies to perform ADLs. Patient to follow up with MD for further therapy needs.        Recommendations for follow up therapy are one component of a multi-disciplinary discharge planning process, led by the attending physician.  Recommendations may be updated based on patient status, additional functional criteria and insurance authorization.   Follow Up Recommendations  Follow physician's recommendations for discharge plan and follow up therapies    Assistance Recommended at Discharge Frequent or constant Supervision/Assistance  Patient can return home with the following A little help with bathing/dressing/bathroom;Assistance with cooking/housework;Help with stairs or ramp for entrance    Functional Status Assessment  Patient has had a recent decline in their functional status and demonstrates the ability to make significant improvements in function in a reasonable and predictable amount of time.   Equipment Recommendations  None recommended by OT    Recommendations for Other Services       Precautions / Restrictions Precautions Precautions: Shoulder Type of Shoulder Precautions: If sitting in controlled environment, ok to come out of sling to give neck a break. Please sleep in it to protect until follow up in office.     OK to use operative arm for feeding, hygiene and ADLs.   Ok to instruct Pendulums and lap slides as exercises. Ok to use operative arm within the following parameters for ADL purposes     New ROM (8/18)   Ok for PROM, AAROM, AROM within pain tolerance and within the following ROM   ER 20   ABD 45   FE 60 Shoulder Interventions: Shoulder sling/immobilizer Precaution Booklet Issued:  (handouts\) Required Braces or Orthoses: Sling Restrictions Weight Bearing Restrictions: Yes RUE Weight Bearing: Non weight bearing      Mobility Bed Mobility                    Transfers                          Balance Overall balance assessment: Mild deficits observed, not formally tested                                         ADL either performed or assessed with clinical judgement   ADL                                         General ADL Comments: Patient and  spouse educated on compensatory strategies for all ADLs.     Vision Baseline Vision/History: 1 Wears glasses       Perception     Praxis      Pertinent Vitals/Pain Pain Assessment Pain Assessment: No/denies pain (secondary to block)     Hand Dominance     Extremity/Trunk Assessment Upper Extremity Assessment Upper Extremity Assessment: RUE deficits/detail;LUE deficits/detail RUE Deficits / Details: impaired secondary to effects of block LUE Deficits / Details: WFL ROM and strength   Lower Extremity Assessment Lower Extremity Assessment: Overall WFL for tasks assessed   Cervical / Trunk Assessment Cervical / Trunk Assessment: Normal    Communication     Cognition Arousal/Alertness: Awake/alert Behavior During Therapy: WFL for tasks assessed/performed Overall Cognitive Status: Within Functional Limits for tasks assessed                                       General Comments       Exercises     Shoulder Instructions Shoulder Instructions Donning/doffing shirt without moving shoulder: Patient able to independently direct caregiver Method for sponge bathing under operated UE: Patient able to independently direct caregiver Donning/doffing sling/immobilizer: Patient able to independently direct caregiver Correct positioning of sling/immobilizer: Patient able to independently direct caregiver Pendulum exercises (written home exercise program): Patient able to independently direct caregiver ROM for elbow, wrist and digits of operated UE: Patient able to independently direct caregiver Sling wearing schedule (on at all times/off for ADL's): Patient able to independently direct caregiver Proper positioning of operated UE when showering: Patient able to independently direct caregiver Dressing change: Patient able to independently direct caregiver Positioning of UE while sleeping: Patient able to independently direct caregiver    Home Living Family/patient expects to be discharged to:: Private residence Living Arrangements: Spouse/significant other Available Help at Discharge: Family;Available 24 hours/day                                    Prior Functioning/Environment                          OT Problem List: Decreased strength;Decreased range of motion;Pain;Obesity;Impaired UE functional use      OT Treatment/Interventions:      OT Goals(Current goals can be found in the care plan section) Acute Rehab OT Goals OT Goal Formulation: All assessment and education complete, DC therapy  OT Frequency:      Co-evaluation              AM-PAC OT "6 Clicks" Daily Activity      Outcome Measure Help from another person eating meals?: A Little Help from another person taking care of personal grooming?: A Little Help from another person toileting, which includes using toliet, bedpan, or urinal?: A Little Help from another person bathing (including washing, rinsing, drying)?: A Little Help from another person to put on and taking off regular upper body clothing?: A Lot Help from another person to put on and taking off regular lower body clothing?: A Lot 6 Click Score: 16   End of Session    Activity Tolerance: Patient tolerated treatment well Patient left: in chair;with call bell/phone within reach;with family/visitor present  OT Visit Diagnosis: Muscle weakness (generalized) (M62.81)  Time: 8403-3533 OT Time Calculation (min): 23 min Charges:  OT General Charges $OT Visit: 1 Visit OT Evaluation $OT Eval Low Complexity: 1 Low OT Treatments $Self Care/Home Management : 8-22 mins  Gustavo Lah, OTR/L [place]  Office [phone]   Lenward Chancellor 04/02/2022, 12:46 PM

## 2022-04-02 NOTE — Anesthesia Procedure Notes (Signed)
Procedure Name: Intubation Date/Time: 04/02/2022 7:44 AM  Performed by: Maxwell Caul, CRNAPre-anesthesia Checklist: Patient identified, Emergency Drugs available, Suction available and Patient being monitored Patient Re-evaluated:Patient Re-evaluated prior to induction Oxygen Delivery Method: Circle system utilized Preoxygenation: Pre-oxygenation with 100% oxygen Induction Type: IV induction Ventilation: Mask ventilation without difficulty Laryngoscope Size: Mac and 4 Grade View: Grade I Tube type: Oral Tube size: 7.0 mm Number of attempts: 1 Airway Equipment and Method: Stylet Placement Confirmation: ETT inserted through vocal cords under direct vision, positive ETCO2 and breath sounds checked- equal and bilateral Secured at: 21 cm Tube secured with: Tape Dental Injury: Teeth and Oropharynx as per pre-operative assessment

## 2022-04-03 ENCOUNTER — Encounter (HOSPITAL_COMMUNITY): Payer: Self-pay | Admitting: Orthopedic Surgery

## 2022-05-27 IMAGING — DX DG PORTABLE PELVIS
1 series · 1 of 1 positions shown · non-contrast
Comparison: Intraoperative evaluation January 08, 2021

CLINICAL DATA: Status post LEFT hip replacement.

EXAM:
PORTABLE PELVIS 1-2 VIEWS

[pelvis ap]
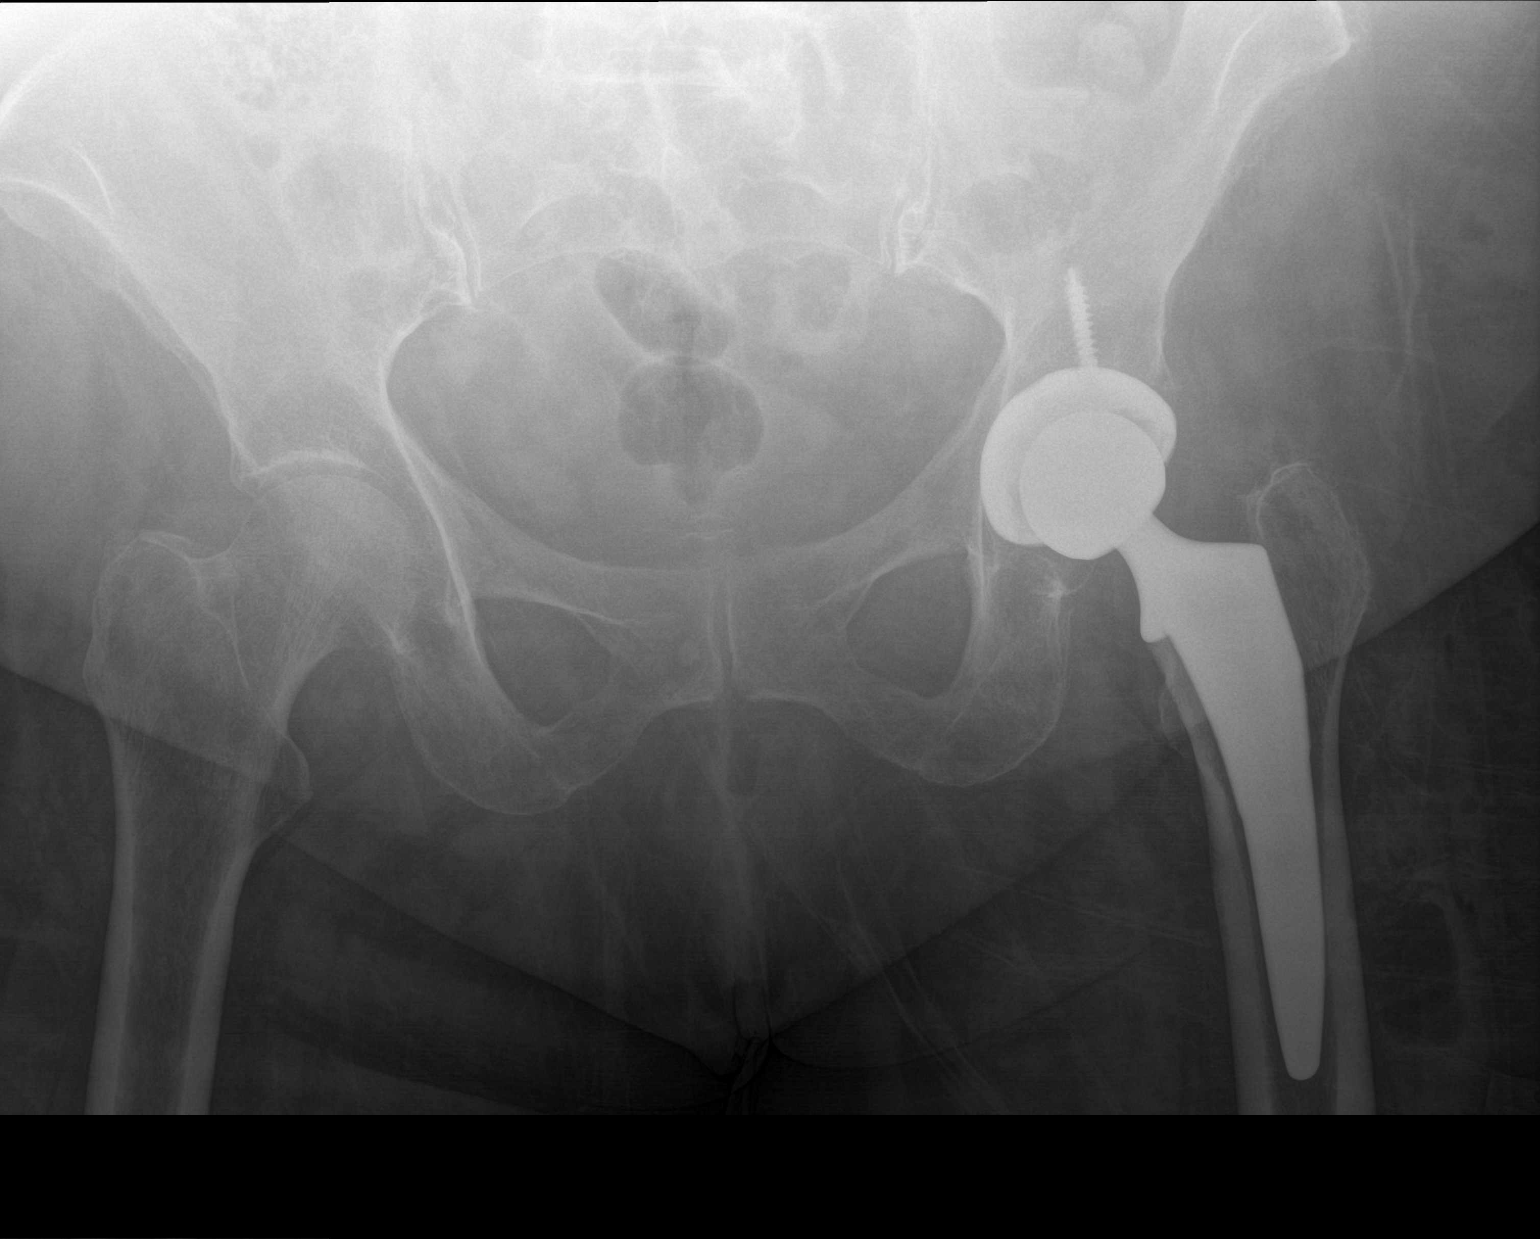

[1 of 1 positions shown; findings below may reference images not displayed]

FINDINGS: Post LEFT hip arthroplasty on this low AP pelvic evaluation, frontal
radiograph excluding the iliac crests without acute finding. Small
amounts of gas in the soft tissues adjacent to the hip and upper
thigh as expected in the recent postoperative setting.
IMPRESSION: Expected findings following LEFT hip arthroplasty, no acute findings
on AP projection.

## 2022-05-29 ENCOUNTER — Other Ambulatory Visit: Payer: Self-pay | Admitting: Cardiovascular Disease

## 2022-05-29 DIAGNOSIS — I480 Paroxysmal atrial fibrillation: Secondary | ICD-10-CM

## 2022-05-29 NOTE — Telephone Encounter (Signed)
Eliquis '5mg'$  refill request received. Patient is 76 years old, weight-127.5kg, Crea-0.73 on 03/26/2022, Diagnosis-Afib, and last seen by Diona Browner on 03/24/2022. Dose is appropriate based on dosing criteria. Will send in refill to requested pharmacy.

## 2022-07-05 ENCOUNTER — Encounter: Payer: Self-pay | Admitting: Cardiovascular Disease

## 2022-07-05 NOTE — Progress Notes (Signed)
Cardiology Office Note   Date:  07/06/2022   ID:  [name], [name] [birth year], MRN [id number]  PCP:  Kaleen Mask, MD  Cardiologist:   Kristeen Miss, MD   Chief Complaint  Patient presents with   Hyperlipidemia   Atrial Fibrillation        Hypertension          [name] is a 77 y.o. female who presents for follow up of her paroxysmal atrial fib  1. Hypertension 2. History of atrial fibrillation 3. Hyperlipidemia 4.  Knee replacement 5.  melanoma       [name] is doing well.  She has decreased her dose of Maxzide and her BP has been elevated.  She has not been exercising on a regular basis but has been trying to exercise more since New's Years Day.  She has lost 6 pounds.  She has not had any chest pain or dyspnea.   She did feel a little bit of extra salt recently.   January 02, 2013:  [name] has done well.  She had some various aches and pains (chest , back)   in Feb.  The pains improved with omeprazol but it caused excessive gas and bloating.  Her lipids have been minimally elevated.    07/05/2013:  [name] is doing well.   She has a sinus infection recently.     January 01, 2014:  [name] is doing well.  She has had some sinus infections.    Jan. 25, 2016:  [name] is doing well. No cardiac issues.  Has had a few palpitations - heart racing.  But her HR would be normal if she takes her pulse.  Rides her stationary bike 30 mninutes a day.   Jan. 24, 2017:  [name] is seen for follow up of her atrial fib.  CHADS2VASC = 3 ( female, HTN, AGE > 65)  She has PAF - was in NSR at her last visit. Is back in A-Fib today .  Asymptomatic.  No CP or dyspnea  Has occasional right sided chest pain .   Has right shoulder pain and is seeing Dr. Rennis Chris soon Had a Myoivew in 2012 - ( inf. Defect) .  Subsequent cath showed normal coronary arteries.   December 19, 2015: [name] is back in NSR today  Has PAF , has never needed cardioversion. Takes  propranolol as needed. Has some dizziness / orthostasis  Some of her dizziness is not related to orthostasis   Jan. 29, 2018:  [name] is seen today .    Has been having problems with her hemmorhoids.   Asked about watchman  I have suggested that she get her hemorrhoids fixed and we resume anticoagulation shortly thereafter.  She has occasional episodes of brief presyncope. Typically occur if she sitting down. Has never had complete syncope   Sept. 7 , 2018:  Has had several episodes of lightneadedness. Typically when she just stands up.   Also can have lightheadedness when she turns her head Has not had true syncope Has run out of potassium 2 weeks ago   September 08, 2017:  [name] is seen today  Dislocated her Right knee,   Required vascular surgery ( popliteal artery rupture) Was in rehab for a month Developed MSRS Was then found to have melanoma  Bilateral cataract removal.   Needs to have left hip replacement but they will not do it until she has lost about 25 lbs.   March 02, 2018:  [name]  is seen today has been having issues with abdominal cramping. Had chills and sweats Sunday night.     Wt. Is 285. 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 4, 2021  [name] is seen today for folow up of her PAF , HTN , obesity 3-4 weeks ago. Had some lightheadedness,  Difficulty taking a deep breath.  Had some mild CP .   Wt is 281 lbs  Is still having intermittant CP  She had labs at her primary MD.  CBC was unremarkable.  Basic metabolic profile was normal.  Creatinine 0.82.  Glucose is 84.  Sodium is 142.  Potassium is 3.6.  Liver enzymes are normal.  Total cholesterol is 166.  Triglyceride levels 103.  HDL is 46.  LDL is 101.  TSH is 1.85.  Hemoglobin A1c is 6.0.  Troponin I is less than 0.01.  Vitamin B-12 level is 687 which is normal.  Has had some PAF .  Is in NSR today  Takes  propranolol as needed for   Feb. 1, 2022: [name] is seen today for follow up of her HTN, PAF, obesity Larey Seat recently and cut her prox joint finger  / compound fracture  Has gained some weight . Lots of swelling in right leg.   On eliquis.    Is in NSR today  She had a DVT last year Has been riding stationary bike.   No CP   Jan. 31, 2023 [name] is seen today for follow up of her HTN, PAF, obesity  Wt is 278 lbs  Has had more surgery on her finger   L hip replacement in Aug  Needs to have a wisdom tooth taken out She will be at low risk for this procedure She may hold her Eliquis for 2 days prior to the procedure    Jan. 29, 2024 [name] is seen for follow up of her HTN, PAF, obesity Wt is 285 lbs .   Had her shoulder replaced in Oct. 2023 Has done well   Breathing is ok Trying to get some exercise  Pulled her back  Doing some yard work .   Advised more diet / exercise/ weight loss   Rosuvastatin has caused her glucose to go up   BP is elevated.    Starting valsartan 80 QD  Lipids, , BMP , ALT in 3 weeks    Past Medical History:  Diagnosis Date   Abnormal Pap smear    Rare ASCUS-H   Arthritis    Atrial fibrillation (HCC)    Cancer (HCC)    melanoma rt leg   Chest pain    Diverticulitis    DVT (deep venous thrombosis) (HCC)    GERD (gastroesophageal reflux disease)    Headache    HTN (hypertension) 07/02/2015   Hypercholesteremia    Hyperglycemia 02/04/2011   Hyperlipidemia 02/04/2011   Hypertension    per pt no htn but needs meds for other reason   Hypokalemia 05/17/2017   Injury of right popliteal artery 05/17/2017   Knee dislocation 05/17/2017   Leg edema 02/04/2011   Leg swelling 02/02/2011   Leukocytosis 05/17/2017   MRSA (methicillin resistant Staphylococcus aureus)    R leg   Obesity    PAF (paroxysmal atrial fibrillation) (HCC) 02/02/2011   Perforated sigmoid colon (HCC)    from diverticulitis   Popliteal artery injury 05/17/2017  Pre-diabetes     Past Surgical History:  Procedure Laterality Date   ABDOMINAL AORTOGRAM W/LOWER EXTREMITY Bilateral 02/19/2020   Procedure: ABDOMINAL AORTOGRAM W/LOWER EXTREMITY;  Surgeon: Maeola Harman, MD;  Location: [facility] INVASIVE CV LAB;  Service: Cardiovascular;  Laterality: Bilateral;  Rt.  lower  leg   ABDOMINAL HYSTERECTOMY     broken finger     BYPASS GRAFT POPLITEAL TO POPLITEAL Right 05/17/2017   Procedure: BYPASS GRAFT ABOVE KNEE POPLITEAL TO BELOW KNEE POPLITEAL USING NONREVERSED RIGHT GREATER SAPHENOUS VEIN;  Surgeon: Maeola Harman, MD;  Location: [facility] OR;  Service: Vascular;  Laterality: Right;   CARDIAC CATHETERIZATION  07/11/2010   Est. EF of 60-65% -- Smooth and normal coronary arteries  -- Normal LV systolic function    CATARACT EXTRACTION, BILATERAL     08-12-17, 08-26-17   CHOLECYSTECTOMY     CYSTOCELE REPAIR  07/22/2010   Vault suspension, cystocele repair, graft and cystoscopy   FINGER SURGERY     MELANOMA EXCISION     right leg 3/12   NASAL SINUS SURGERY     NEUROMA SURGERY     rt foot   REVERSE SHOULDER ARTHROPLASTY Right 04/02/2022   Procedure: REVERSE SHOULDER ARTHROPLASTY;  Surgeon: Francena Hanly, MD;  Location: [facility] ORS;  Service: Orthopedics;  Laterality: Right;    TONSILLECTOMY     TOTAL HIP ARTHROPLASTY Left 01/28/2021   Procedure: TOTAL HIP ARTHROPLASTY ANTERIOR APPROACH;  Surgeon: Durene Romans, MD;  Location: [facility] ORS;  Service: Orthopedics;  Laterality: Left;   TOTAL KNEE ARTHROPLASTY  06/20/2009   left knee   TOTAL KNEE ARTHROPLASTY  12/27/2008   right knee   TOTAL VAGINAL HYSTERECTOMY  07/22/2010   with bilateral salpingo-oophorectomy by Dr. Tresa Res   VEIN HARVEST Right 05/17/2017   Procedure: RIGHT GREATER SAPHENOUS VEIN HARVEST;  Surgeon: Maeola Harman, MD;  Location: [facility] OR;  Service: Vascular;  Laterality: Right;   WISDOM TOOTH EXTRACTION Right 08/14/2021   Filbert Berthold DDS     Current Outpatient  Medications  Medication Sig Dispense Refill   amoxicillin (AMOXIL) 500 MG capsule Take 2,000 mg by mouth See admin instructions. Take 2000 mg by mouth 1 hour prior to dental appointment     Ascorbic Acid (VITAMIN C) 1000 MG tablet Take 1,000 mg by mouth daily.     aspirin EC 81 MG tablet Take 81 mg by mouth daily.      Biotin 5000 MCG TABS Take 5,000 mcg by mouth daily.     Calcium Carb-Cholecalciferol (CALCIUM + D3 PO) Take 1 tablet by mouth daily.     conjugated estrogens (PREMARIN) vaginal cream 1/2 gram vaginally twice weekly 30 g 1   COVID-19 mRNA bivalent vaccine, Pfizer, (PFIZER COVID-19 VAC BIVALENT) injection Inject into the muscle. 0.3 mL 0   COVID-19 mRNA vaccine 2023-2024 (COMIRNATY) syringe Inject into the muscle. 0.3 mL 0   diltiazem (CARDIZEM CD) 240 MG 24 hr capsule TAKE 1 CAPSULE (240 MG TOTAL) BY MOUTH DAILY. 90 capsule 3   docusate sodium (COLACE) 100 MG capsule Take 1 capsule (100 mg total) by mouth 2 (two) times daily. 10 capsule 0   ELIQUIS 5 MG TABS tablet TAKE 1 TABLET BY MOUTH 2 TIMES DAILY. 60 tablet 5   famotidine (PEPCID) 20 MG tablet Take 20 mg by mouth daily.     fluticasone (FLONASE) 50 MCG/ACT nasal spray Place 1-2 sprays into both nostrils at bedtime as needed for allergies.     loratadine (CLARITIN) 10 MG tablet  Take 10 mg by mouth daily.     Multiple Vitamin (MULTIVITAMIN WITH MINERALS) TABS tablet Take 1 tablet by mouth daily.     nystatin cream (MYCOSTATIN) Apply 1 Application topically daily as needed (yeast).     oxyCODONE-acetaminophen (PERCOCET) 5-325 MG tablet Take 1 tablet by mouth every 4 (four) hours as needed (max 6 q). 20 tablet 0   PATADAY 0.2 % SOLN Place 1 drop into both eyes daily.   4   potassium chloride SA (KLOR-CON M) 20 MEQ tablet Take 1 tablet (20 mEq total) by mouth daily. 90 tablet 3   psyllium (REGULOID) 0.52 g capsule Take 2.6 g by mouth 4 (four) times daily.     rosuvastatin (CRESTOR) 10 MG tablet TAKE 1 TABLET (10 MG TOTAL) BY MOUTH  DAILY. 90 tablet 1   triamterene-hydrochlorothiazide (MAXZIDE) 75-50 MG tablet Take 1 tablet by mouth daily.     valsartan (DIOVAN) 80 MG tablet Take 1 tablet (80 mg total) by mouth daily. 90 tablet 3   propranolol (INDERAL) 10 MG tablet Take 1 tablet (10 mg total) by mouth 4 (four) times daily as needed (palpitations or fast heart rate). 60 tablet 6   No current facility-administered medications for this visit.    Allergies:   Sulfa antibiotics, Sulfonamide derivatives, Milk-related compounds, Atenolol, Erythromycin, and Plavix [clopidogrel bisulfate]    Social History:  The patient  reports that she has never smoked. She has never used smokeless tobacco. She reports that she does not drink alcohol and does not use drugs.   Family History:  The patient's family history includes Atrial fibrillation in her mother; Diabetes in her mother; Emphysema in her father; Hypertension in her mother; Other in her brother.    ROS:  Please see the history of present illness.   Otherwise, review of systems are positive for none.   All other systems are reviewed and negative.    Physical Exam: Blood pressure (!) 134/90, pulse 82, height 5' 8.5" (1.74 m), weight 285 lb 9.6 oz (129.5 kg), last menstrual period 07/22/2010, SpO2 99 %.      GEN:  Well nourished, well developed in no acute distress HEENT: Normal NECK: No JVD; No carotid bruits LYMPHATICS: No lymphadenopathy CARDIAC:  irreg.  Irreg.  RESPIRATORY:  Clear to auscultation without rales, wheezing or rhonchi  ABDOMEN: Soft, non-tender, non-distended MUSCULOSKELETAL:  No edema; No deformity  SKIN: Warm and dry NEUROLOGIC:  Alert and oriented x 3  EKG:    July 06, 2022: Atrial fibrillation with with occasional premature ventricular contractions.  Heart rate is 82.  Right bundle branch block.   Recent Labs: 10/28/2021: ALT 18 03/26/2022: BUN 18; Creatinine, Ser 0.73; Hemoglobin 14.7; Platelets 203; Potassium 3.8; Sodium 140    Lipid  Panel    Component Value Date/Time   CHOL 122 10/28/2021 0835   TRIG 78 10/28/2021 0835   HDL 51 10/28/2021 0835   CHOLHDL 2.4 10/28/2021 0835   CHOLHDL 3.4 07/02/2015 0737   VLDL 16 07/02/2015 0737   LDLCALC 55 10/28/2021 0835      Wt Readings from Last 3 Encounters:  07/06/22 285 lb 9.6 oz (129.5 kg)  04/02/22 281 lb 1.4 oz (127.5 kg)  03/26/22 281 lb (127.5 kg)      Other studies Reviewed: Additional studies/ records that were reviewed today include: . Review of the above records demonstrates:    ASSESSMENT AND PLAN:  1.  Paroxysmal atrial fib:       [name] is back in  atrial fibrillation today.  She cannot tell that her heart rate is fast.  2. Hypertension:    Blood pressure is mildly elevated.  Will start her on valsartan 80 mg a day.  Will check a basic metabolic profile in 3 weeks.    3. Hyperlipidemia:     Was we will check lipids and ALT in 3 weeks.  Continue Eliquis.  Will plan on checking a CBC in 3 weeks.   4. Obesity:      Advised her to continue to continue work on diet, exercise, weight loss.  She started a new diet that combines Mediterranean diet and Asian diet.  She is eating relatively low-carb and low-salt.     Current medicines are reviewed at length with the patient today.  The patient does not have concerns regarding medicines.  The following changes have been made:  no change  Labs/ tests ordered today include:   Orders Placed This Encounter  Procedures   Lipid panel   ALT   Basic metabolic panel   CBC   Hemoglobin A1c   EKG 12-Lead      Disposition:   1 year office visit with an APP or me     Signed, Kristeen Miss, MD  07/06/2022 6:38 PM    [facility] [address] [place], [place], [place]  [address] Phone: [phone]; Fax: [phone]

## 2022-07-06 ENCOUNTER — Ambulatory Visit: Payer: Medicare PPO | Attending: Cardiovascular Disease | Admitting: Cardiovascular Disease

## 2022-07-06 ENCOUNTER — Encounter: Payer: Self-pay | Admitting: Cardiovascular Disease

## 2022-07-06 VITALS — BP 134/90 | HR 82 | Ht 68.5 in | Wt 285.6 lb

## 2022-07-06 DIAGNOSIS — R739 Hyperglycemia, unspecified: Secondary | ICD-10-CM | POA: Diagnosis not present

## 2022-07-06 DIAGNOSIS — E782 Mixed hyperlipidemia: Secondary | ICD-10-CM

## 2022-07-06 DIAGNOSIS — I10 Essential (primary) hypertension: Secondary | ICD-10-CM | POA: Diagnosis not present

## 2022-07-06 DIAGNOSIS — I480 Paroxysmal atrial fibrillation: Secondary | ICD-10-CM | POA: Diagnosis not present

## 2022-07-06 MED ORDER — VALSARTAN 80 MG PO TABS: 80.0000 mg | ORAL_TABLET | Freq: Every day | ORAL | 3 refills | Status: DC

## 2022-07-06 MED ORDER — PROPRANOLOL HCL 10 MG PO TABS: 10.0000 mg | ORAL_TABLET | Freq: Four times a day (QID) | ORAL | 6 refills | Status: AC | PRN

## 2022-07-06 NOTE — Patient Instructions (Signed)
Medication Instructions:  START Valsartan '80mg'$  daily REFILLED Propranolol *If you need a refill on your cardiac medications before your next appointment, please call your pharmacy*   Lab Work: Lipids, ALT, BMET in 3 weeks If you have labs (blood work) drawn today and your tests are completely normal, you will receive your results only by: [place] (if you have MyChart) OR A paper copy in the mail If you have any lab test that is abnormal or we need to change your treatment, we will call you to review the results.   Testing/Procedures: NONE   Follow-Up: At [facility], you and your health needs are our priority.  As part of our continuing mission to provide you with exceptional heart care, we have created designated Provider Care Teams.  These Care Teams include your primary Cardiologist (physician) and Advanced Practice Providers (APPs -  Physician Assistants and Nurse Practitioners) who all work together to provide you with the care you need, when you need it.  Your next appointment:   1 year(s)  Provider:   Mertie Moores, MD

## 2022-07-07 ENCOUNTER — Telehealth: Payer: Self-pay | Admitting: Cardiovascular Disease

## 2022-07-07 MED ORDER — PRAVASTATIN SODIUM 80 MG PO TABS: 80.0000 mg | ORAL_TABLET | Freq: Every evening | ORAL | 3 refills | Status: DC

## 2022-07-07 NOTE — Telephone Encounter (Signed)
-----  Message from Thayer Headings, MD sent at 07/06/2022  5:47 PM EST ----- Please relay to [name]  The rosuvastatin could be contributing slightly to her elevated glucose levels.  I think her diet and weight are also contributing to her elevated glucose levels.  We can consider stopping the rosuvastatin and starting pravastatin 80 mg a day.  This should not affect her glucose levels and will still offer some lipid lowering.  I think the main culprit is probably dietary and weight-based.  PN  ----- Message ----- From: Leeroy Bock, RPH-CPP Sent: 07/06/2022   3:48 PM EST To: Thayer Headings, MD  Lol agreed that her diet and lack of exercise are likely contributing! It looks like she's been prediabetic on her last few A1cs. Rosuvastatin may be contributing a bit, but looks like her lipids are excellent on it. Higher doses of rosuvastatin and atorvastatin are most likely to cause an increase in glucose. I don't see that she's tried another statin before - pravastatin is glucose-neutral and could be a good option to try if she wanted to change from her rosuvastatin. Usually insurance won't cover PCSK9i without a second statin challenge. Repatha can also increase blood sugar too, so I'm not sure that it's a perfect solution for her.  Thanks, Jinny Blossom ----- Message ----- From: Thayer Headings, MD Sent: 07/06/2022   2:28 PM EST To: Leeroy Bock, RPH-CPP  Hi Preslei Blakley has noticed that her glucose is higher since being on Rosuvastatin. Her intake of snacks, cookies and lack of exercise may have something to do with this also )   Would she be a candidate for PCSK9 inhibitor if this hyperglycemia is related to rosuvastatin   Thanks  Phil

## 2022-07-07 NOTE — Telephone Encounter (Signed)
Called and reviewed all information with patient. She would like to try the Pravastatin instead of Crestor. Rx sent to pharmacy on file.

## 2022-07-09 ENCOUNTER — Other Ambulatory Visit: Payer: Self-pay | Admitting: Cardiovascular Disease

## 2022-07-13 ENCOUNTER — Telehealth: Payer: Self-pay | Admitting: Cardiovascular Disease

## 2022-07-13 NOTE — Telephone Encounter (Signed)
Pt c/o medication issue:  1. Name of Medication:  valsartan (DIOVAN) 80 MG tablet  2. How are you currently taking this medication (dosage and times per day)?   3. Are you having a reaction (difficulty breathing--STAT)?   4. What is your medication issue?   Patient states the morning after starting on this medication her BP was 109/68 79. Around 12:00 PM the same day it was 100/58 75 while standing in her kitchen. 13 minutes passed and she checked her BP again. It was 96/60 while sitting with feet propped up. That same night it was 98/62. Patient states she stopped taking the medication on 1/31. Today her BP is 112/72 75.

## 2022-07-13 NOTE — Telephone Encounter (Signed)
Spoke with the patient who states that her blood pressure was elevated when she came in last week to see Dr. Acie Fredrickson because she was aggravated. She states that she was started on valsartan 80 mg daily at the visit. She took her first dose on Wednesday and second dose Thursday. She states Thursday afternoon she became very lightheaded. She took her blood pressure several times over that next hour with the following readings: 109/78, 96/60, and 98/63. She states that she had to rest for a while. She did not take any more valsartan after Thursday. Her readings since then have been 111/66, 107/71, 112/72. Patient is not going to take valsartan anymore and wanted to let Dr. Acie Fredrickson know.

## 2022-07-15 NOTE — Telephone Encounter (Signed)
Nahser, Robin Cheng, MD  Donnalee Curry K Caller: Unspecified (2 days ago,  3:16 PM) OK to hold Valsartan as long as her BP remains normal Will follow up at her next appt s PN  Called and spoke with patient to make sure she knew MD got her message and medication has been removed from med list at this time.

## 2022-07-28 ENCOUNTER — Ambulatory Visit: Payer: Medicare PPO | Attending: Cardiovascular Disease

## 2022-07-28 DIAGNOSIS — I480 Paroxysmal atrial fibrillation: Secondary | ICD-10-CM

## 2022-07-28 DIAGNOSIS — E782 Mixed hyperlipidemia: Secondary | ICD-10-CM

## 2022-07-28 DIAGNOSIS — I10 Essential (primary) hypertension: Secondary | ICD-10-CM

## 2022-07-28 DIAGNOSIS — R739 Hyperglycemia, unspecified: Secondary | ICD-10-CM

## 2022-07-29 LAB — BASIC METABOLIC PANEL
BUN/Creatinine Ratio: 20 (ref 12–28)
BUN: 18 mg/dL (ref 8–27)
CO2: 27 mmol/L (ref 20–29)
Calcium: 10 mg/dL (ref 8.7–10.3)
Chloride: 101 mmol/L (ref 96–106)
Creatinine, Ser: 0.92 mg/dL (ref 0.57–1.00)
Glucose: 149 mg/dL — ABNORMAL HIGH (ref 70–99)
Potassium: 3.8 mmol/L (ref 3.5–5.2)
Sodium: 141 mmol/L (ref 134–144)
eGFR: 65 mL/min/{1.73_m2} (ref >59)

## 2022-07-29 LAB — CBC
Hematocrit: 48.9 % — ABNORMAL HIGH (ref 34.0–46.6)
Hemoglobin: 16.2 g/dL — ABNORMAL HIGH (ref 11.1–15.9)
MCH: 30.1 pg (ref 26.6–33.0)
MCHC: 33.1 g/dL (ref 31.5–35.7)
MCV: 91 fL (ref 79–97)
Platelets: 248 10*3/uL (ref 150–450)
RBC: 5.38 x10E6/uL — ABNORMAL HIGH (ref 3.77–5.28)
RDW: 13.1 % (ref 11.7–15.4)
WBC: 7.8 10*3/uL (ref 3.4–10.8)

## 2022-07-29 LAB — LIPID PANEL
Chol/HDL Ratio: 2.7 ratio (ref 0.0–4.4)
Cholesterol, Total: 135 mg/dL (ref 100–199)
HDL: 50 mg/dL (ref >39)
LDL Chol Calc (NIH): 66 mg/dL (ref 0–99)
Triglycerides: 106 mg/dL (ref 0–149)
VLDL Cholesterol Cal: 19 mg/dL (ref 5–40)

## 2022-07-29 LAB — HEMOGLOBIN A1C
Est. average glucose Bld gHb Est-mCnc: 146 mg/dL
Hgb A1c MFr Bld: 6.7 % — ABNORMAL HIGH (ref 4.8–5.6)

## 2022-07-29 LAB — ALT: ALT: 20 IU/L (ref 0–32)

## 2022-08-25 ENCOUNTER — Other Ambulatory Visit: Payer: Self-pay | Admitting: Family Medicine

## 2022-08-25 ENCOUNTER — Telehealth: Payer: Self-pay | Admitting: Cardiovascular Disease

## 2022-08-25 ENCOUNTER — Other Ambulatory Visit: Payer: Self-pay | Admitting: Cardiovascular Disease

## 2022-08-25 DIAGNOSIS — Z1231 Encounter for screening mammogram for malignant neoplasm of breast: Secondary | ICD-10-CM

## 2022-08-25 NOTE — Telephone Encounter (Signed)
Pt called stating her PCP has received her lab results from 07/28/22. She asked that they be faxed to [phone]

## 2022-08-28 NOTE — Telephone Encounter (Signed)
Labs forwarded to PCP per pt request.

## 2022-09-22 ENCOUNTER — Other Ambulatory Visit: Payer: Self-pay | Admitting: *Deleted

## 2022-09-22 DIAGNOSIS — M79661 Pain in right lower leg: Secondary | ICD-10-CM

## 2022-09-22 DIAGNOSIS — S85001A Unspecified injury of popliteal artery, right leg, initial encounter: Secondary | ICD-10-CM

## 2022-09-30 ENCOUNTER — Ambulatory Visit (INDEPENDENT_AMBULATORY_CARE_PROVIDER_SITE_OTHER)
Admission: RE | Admit: 2022-09-30 | Discharge: 2022-09-30 | Disposition: A | Discharge status: Home / Self Care | Payer: Medicare PPO | Source: Ambulatory Visit | Attending: Vascular Surgery | Admitting: Vascular Surgery

## 2022-09-30 ENCOUNTER — Ambulatory Visit (HOSPITAL_COMMUNITY)
Admission: RE | Admit: 2022-09-30 | Discharge: 2022-09-30 | Disposition: A | Discharge status: Home / Self Care | Payer: Medicare PPO | Source: Ambulatory Visit | Attending: Vascular Surgery | Admitting: Vascular Surgery

## 2022-09-30 ENCOUNTER — Encounter: Payer: Self-pay | Admitting: Physician Assistant

## 2022-09-30 ENCOUNTER — Ambulatory Visit: Payer: Medicare PPO | Admitting: Physician Assistant

## 2022-09-30 VITALS — BP 140/84 | HR 84 | Temp 98.1°F | Wt 279.0 lb

## 2022-09-30 DIAGNOSIS — S85001A Unspecified injury of popliteal artery, right leg, initial encounter: Secondary | ICD-10-CM

## 2022-09-30 DIAGNOSIS — M79661 Pain in right lower leg: Secondary | ICD-10-CM

## 2022-09-30 LAB — VAS US ABI WITH/WO TBI
Left ABI: 0.93
Right ABI: 1.01

## 2022-09-30 NOTE — Progress Notes (Signed)
[facility] [place] HISTORY AND PHYSICAL   History of Present Illness:  Patient is a 77 y.o. year old female who presents for evaluation of Right LE.  She has history of right above-knee to below-knee popliteal artery bypass with vein performed for acute right lower extremity ischemia with knee dislocation when she feel on her back deck letting the dog out in the snow.  by Dr. Randie Heinz on 05/17/17.   She has chronic decreased sensation on the lateral right lower leg to the foot.  No rest pain, no claudication or non healing wounds.     She is medically managed on ASA, Eliquis and a daily Statin.  She wears B thigh compression.  She states this makes her right Leg feel better.       Past Medical History:  Diagnosis Date   Abnormal Pap smear    Rare ASCUS-H   Arthritis    Atrial fibrillation    Cancer    melanoma rt leg   Chest pain    Diverticulitis    DVT (deep venous thrombosis)    GERD (gastroesophageal reflux disease)    Headache    HTN (hypertension) 07/02/2015   Hypercholesteremia    Hyperglycemia 02/04/2011   Hyperlipidemia 02/04/2011   Hypertension    per pt no htn but needs meds for other reason   Hypokalemia 05/17/2017   Injury of right popliteal artery 05/17/2017   Knee dislocation 05/17/2017   Leg edema 02/04/2011   Leg swelling 02/02/2011   Leukocytosis 05/17/2017   MRSA (methicillin resistant Staphylococcus aureus)    R leg   Obesity    PAF (paroxysmal atrial fibrillation) 02/02/2011   Perforated sigmoid colon    from diverticulitis   Popliteal artery injury 05/17/2017   Pre-diabetes     Past Surgical History:  Procedure Laterality Date   ABDOMINAL AORTOGRAM W/LOWER EXTREMITY Bilateral 02/19/2020   Procedure: ABDOMINAL AORTOGRAM W/LOWER EXTREMITY;  Surgeon: Maeola Harman, MD;  Location: [facility] INVASIVE CV LAB;  Service: Cardiovascular;  Laterality: Bilateral;  Rt.  lower  leg   ABDOMINAL HYSTERECTOMY     broken finger      BYPASS GRAFT POPLITEAL TO POPLITEAL Right 05/17/2017   Procedure: BYPASS GRAFT ABOVE KNEE POPLITEAL TO BELOW KNEE POPLITEAL USING NONREVERSED RIGHT GREATER SAPHENOUS VEIN;  Surgeon: Maeola Harman, MD;  Location: [facility] OR;  Service: Vascular;  Laterality: Right;   CARDIAC CATHETERIZATION  07/11/2010   Est. EF of 60-65% -- Smooth and normal coronary arteries  -- Normal LV systolic function    CATARACT EXTRACTION, BILATERAL     08-12-17, 08-26-17   CHOLECYSTECTOMY     CYSTOCELE REPAIR  07/22/2010   Vault suspension, cystocele repair, graft and cystoscopy   FINGER SURGERY     MELANOMA EXCISION     right leg 3/12   NASAL SINUS SURGERY     NEUROMA SURGERY     rt foot   REVERSE SHOULDER ARTHROPLASTY Right 04/02/2022   Procedure: REVERSE SHOULDER ARTHROPLASTY;  Surgeon: Francena Hanly, MD;  Location: [facility] ORS;  Service: Orthopedics;  Laterality: Right;    TONSILLECTOMY     TOTAL HIP ARTHROPLASTY Left 01/28/2021   Procedure: TOTAL HIP ARTHROPLASTY ANTERIOR APPROACH;  Surgeon: Durene Romans, MD;  Location: [facility] ORS;  Service: Orthopedics;  Laterality: Left;   TOTAL KNEE ARTHROPLASTY  06/20/2009   left knee   TOTAL KNEE ARTHROPLASTY  12/27/2008   right knee   TOTAL VAGINAL HYSTERECTOMY  07/22/2010   with bilateral salpingo-oophorectomy  by Dr. Tresa Res   VEIN HARVEST Right 05/17/2017   Procedure: RIGHT GREATER SAPHENOUS VEIN HARVEST;  Surgeon: Maeola Harman, MD;  Location: [facility] OR;  Service: Vascular;  Laterality: Right;   WISDOM TOOTH EXTRACTION Right 08/14/2021   Filbert Berthold DDS    ROS:   General:  No weight loss, Fever, chills  HEENT: No recent headaches, no nasal bleeding, no visual changes, no sore throat  Neurologic: No dizziness, blackouts, seizures. No recent symptoms of stroke or mini- stroke. No recent episodes of slurred speech, or temporary blindness.  Cardiac: No recent episodes of chest pain/pressure, no shortness of breath at rest.  No shortness of breath  with exertion.  Denies history of atrial fibrillation or irregular heartbeat  Vascular: No history of rest pain in feet.  No history of claudication.  No history of non-healing ulcer, No history of DVT   Pulmonary: No home oxygen, no productive cough, no hemoptysis,  No asthma or wheezing  Musculoskeletal:  [ ]  Arthritis, [ ]  Low back pain,  [ ]  Joint pain  Hematologic:No history of hypercoagulable state.  No history of easy bleeding.  No history of anemia  Gastrointestinal: No hematochezia or melena,  No gastroesophageal reflux, no trouble swallowing  Urinary: [ ]  chronic Kidney disease, [ ]  on HD - [ ]  MWF or [ ]  TTHS, [ ]  Burning with urination, [ ]  Frequent urination, [ ]  Difficulty urinating;   Skin: No rashes  Psychological: No history of anxiety,  No history of depression  Social History Social History   Tobacco Use   Smoking status: Never   Smokeless tobacco: Never  Vaping Use   Vaping Use: Never used  Substance Use Topics   Alcohol use: No   Drug use: No    Family History Family History  Problem Relation Age of Onset   Atrial fibrillation Mother        has pacemaker   Diabetes Mother    Hypertension Mother    Emphysema Father    Other Brother        bulbar palsy   Breast cancer Neg Hx     Allergies  Allergies  Allergen Reactions   Sulfa Antibiotics Other (See Comments)    Turned eyes yellow    Sulfonamide Derivatives Rash    Turned eyes yellow   Milk-Related Compounds Diarrhea   Atenolol Cough   Erythromycin Nausea Only   Plavix [Clopidogrel Bisulfate] Cough     Current Outpatient Medications  Medication Sig Dispense Refill   amoxicillin (AMOXIL) 500 MG capsule Take 2,000 mg by mouth See admin instructions. Take 2000 mg by mouth 1 hour prior to dental appointment     Ascorbic Acid (VITAMIN C) 1000 MG tablet Take 1,000 mg by mouth daily.     aspirin EC 81 MG tablet Take 81 mg by mouth daily.      Biotin 5000 MCG TABS Take 5,000 mcg by mouth  daily.     Calcium Carb-Cholecalciferol (CALCIUM + D3 PO) Take 1 tablet by mouth daily.     conjugated estrogens (PREMARIN) vaginal cream 1/2 gram vaginally twice weekly 30 g 1   COVID-19 mRNA bivalent vaccine, Pfizer, (PFIZER COVID-19 VAC BIVALENT) injection Inject into the muscle. 0.3 mL 0   COVID-19 mRNA vaccine 2023-2024 (COMIRNATY) syringe Inject into the muscle. 0.3 mL 0   diltiazem (CARDIZEM CD) 240 MG 24 hr capsule TAKE 1 CAPSULE (240 MG TOTAL) BY MOUTH DAILY. 90 capsule 3   docusate sodium (COLACE) 100 MG  capsule Take 1 capsule (100 mg total) by mouth 2 (two) times daily. 10 capsule 0   ELIQUIS 5 MG TABS tablet TAKE 1 TABLET BY MOUTH 2 TIMES DAILY. 60 tablet 5   famotidine (PEPCID) 20 MG tablet Take 20 mg by mouth daily.     fluticasone (FLONASE) 50 MCG/ACT nasal spray Place 1-2 sprays into both nostrils at bedtime as needed for allergies.     loratadine (CLARITIN) 10 MG tablet Take 10 mg by mouth daily.     Multiple Vitamin (MULTIVITAMIN WITH MINERALS) TABS tablet Take 1 tablet by mouth daily.     nystatin cream (MYCOSTATIN) Apply 1 Application topically daily as needed (yeast).     oxyCODONE-acetaminophen (PERCOCET) 5-325 MG tablet Take 1 tablet by mouth every 4 (four) hours as needed (max 6 q). 20 tablet 0   PATADAY 0.2 % SOLN Place 1 drop into both eyes daily.   4   potassium chloride SA (KLOR-CON M) 20 MEQ tablet Take 1 tablet (20 mEq total) by mouth daily. 90 tablet 3   pravastatin (PRAVACHOL) 80 MG tablet Take 1 tablet (80 mg total) by mouth every evening. 90 tablet 3   propranolol (INDERAL) 10 MG tablet Take 1 tablet (10 mg total) by mouth 4 (four) times daily as needed (palpitations or fast heart rate). 60 tablet 6   psyllium (REGULOID) 0.52 g capsule Take 2.6 g by mouth 4 (four) times daily.     triamterene-hydrochlorothiazide (MAXZIDE) 75-50 MG tablet Take 1 tablet by mouth daily.     No current facility-administered medications for this visit.    Physical  Examination  Vitals:   09/30/22 1334  BP: (!) 140/84  Pulse: 84  Temp: 98.1 F (36.7 C)  TempSrc: Temporal  SpO2: 96%  Weight: 279 lb (126.6 kg)    Body mass index is 41.8 kg/m.  General:  Alert and oriented, no acute distress HEENT: Normal Neck: No bruit or JVD Pulmonary: Clear to auscultation bilaterally Cardiac: Irregular Rate and Rhythm without murmur known A fib Abdomen: Soft, non-tender, non-distended, no mass, no scars Skin: No rash Extremity Pulses:  2+ radial,  femoral, dorsalis pedis,  pulses bilaterally Musculoskeletal: No deformity or edema  Neurologic: Upper and lower extremity motor 5/5 and symmetric  DATA:  Right Graft #1: Above knee-below knee popliteal artery bypass graft  +------------------+--------+--------+---------+--------+                   PSV cm/sStenosisWaveform Comments  +------------------+--------+--------+---------+--------+  Inflow           56              biphasic           +------------------+--------+--------+---------+--------+  Prox Anastomosis  44              triphasic          +------------------+--------+--------+---------+--------+  Proximal Graft    47              triphasic          +------------------+--------+--------+---------+--------+  Mid Graft         35              triphasic          +------------------+--------+--------+---------+--------+  Distal Graft      50              biphasic           +------------------+--------+--------+---------+--------+  Distal Anastomosis55  biphasic           +------------------+--------+--------+---------+--------+  Outflow          39              biphasic           +------------------+--------+--------+---------+--------+   Summary:  Right: Patent graft with no obvious evidence of stenosis.     ABI Findings:  +---------+------------------+-----+---------+--------+  Right   Rt Pressure (mmHg)IndexWaveform Comment    +---------+------------------+-----+---------+--------+  Brachial 167                                       +---------+------------------+-----+---------+--------+  PTA     168               1.01 triphasic          +---------+------------------+-----+---------+--------+  DP      154               0.92 triphasic          +---------+------------------+-----+---------+--------+  Great Toe143               0.86                    +---------+------------------+-----+---------+--------+   +---------+------------------+-----+---------+-------+  Left    Lt Pressure (mmHg)IndexWaveform Comment  +---------+------------------+-----+---------+-------+  Brachial 152                                      +---------+------------------+-----+---------+-------+  PTA     155               0.93 triphasic         +---------+------------------+-----+---------+-------+  DP      147               0.88 biphasic          +---------+------------------+-----+---------+-------+  Great Toe147               0.88                   +---------+------------------+-----+---------+-------+   +-------+-----------+-----------+------------+------------+  ABI/TBIToday's ABIToday's TBIPrevious ABIPrevious TBI  +-------+-----------+-----------+------------+------------+  Right 1.01       0.86       1.15        0.71          +-------+-----------+-----------+------------+------------+  Left  0.93       0.88       1.05        0.85          +-------+-----------+-----------+------------+------------+    ASSESSMENT/  PLAN:   Patient is a 77 y.o. year old female who presents for evaluation of Right LE.  She has history of right above-knee to below-knee popliteal artery bypass with vein performed for acute right lower extremity ischemia with knee dislocation when she feel on her back deck letting the dog out in the snow.  by Dr. Randie Heinz on 05/17/17.   Her  bypass is patent and her ABI's are unchanged.  She has no symptoms of claudication, rest pain or non healing wounds.  She will stay as active as she tolerates.  I will have her return for continue surveillance in 1 year with repeat duplex and ABI's.  Continue ASA, Eliquis and Statin.  Roxy Horseman PA-C [facility] [place] Office: [phone]  MD in clinic [place]

## 2022-10-08 ENCOUNTER — Ambulatory Visit: Payer: Medicare PPO

## 2022-10-08 ENCOUNTER — Other Ambulatory Visit: Payer: Self-pay | Admitting: Nurse Practitioner

## 2022-10-08 ENCOUNTER — Ambulatory Visit
Admission: RE | Admit: 2022-10-08 | Discharge: 2022-10-08 | Disposition: A | Discharge status: Home / Self Care | Payer: Medicare PPO | Source: Ambulatory Visit | Attending: Nurse Practitioner | Admitting: Nurse Practitioner

## 2022-10-08 DIAGNOSIS — R059 Cough, unspecified: Secondary | ICD-10-CM

## 2022-10-14 ENCOUNTER — Ambulatory Visit
Admission: RE | Admit: 2022-10-14 | Discharge: 2022-10-14 | Disposition: A | Discharge status: Home / Self Care | Payer: Medicare PPO | Source: Ambulatory Visit | Attending: Family Medicine | Admitting: Family Medicine

## 2022-10-14 DIAGNOSIS — Z1231 Encounter for screening mammogram for malignant neoplasm of breast: Secondary | ICD-10-CM

## 2022-11-21 ENCOUNTER — Other Ambulatory Visit: Payer: Self-pay | Admitting: Cardiovascular Disease

## 2022-11-21 DIAGNOSIS — I480 Paroxysmal atrial fibrillation: Secondary | ICD-10-CM

## 2022-11-23 NOTE — Telephone Encounter (Signed)
Prescription refill request for Eliquis received. Indication:afib Last office visit:1/24 Scr:0.92  2/24 Age: 77 Weight:126.6  kg  Prescription refilled

## 2022-12-21 ENCOUNTER — Telehealth: Payer: Self-pay | Admitting: Cardiovascular Disease

## 2022-12-21 NOTE — Telephone Encounter (Signed)
Incoming stat call. Spoke with Pt. Pt is not having current chest pain. Pt states she was sick and had a fever in April. Pt was put on Augmentin and symptoms went away. Pt states she has been on augmentin again since that time. (Totaling 2 times on augmentin) Pt states her PCP has run multiple tests on her for stomach pain. Pt states her stomach hurts when she does not eat. She has gained 12 pounds since she first became sick because she feels the need to snack. Pt states she has pain in upper right shoulder area off and on but that it is normally received by a burp. Pt states her BP has been good. Pt states she has not had dizziness or papulations.  Pt often gets nauseous after eating. Pt was put on anti-acid medication by PCP but read the side effects and does not wish to continue taking it.  Pt was scheduled for a appointment on 01/29/23 with Dr. Elease Hashimoto before being transferred to me.  Advised pt that this seemed to be a GI issue and I felt she should contact her PCP. Told the pt I would write up our conversion and send it over for review by Dr.Nahser and if he had any other recommendations or felt her appointment needed to be moved up that someone would reach out and reschedule her.

## 2022-12-21 NOTE — Telephone Encounter (Signed)
Pt c/o of Chest Pain: STAT if CP now or developed within 24 hours  1. Are you having CP right now? Yes   2. Are you experiencing any other symptoms (ex. SOB, nausea, vomiting, sweating)? nausea  3. How long have you been experiencing CP? April  4. Is your CP continuous or coming and going? Coming and going  5. Have you taken Nitroglycerin? No ?

## 2022-12-23 NOTE — Telephone Encounter (Signed)
Nahser, Robin Ping, MD  Lars Mage, RN2 days ago   Her symptoms sound GI to me  She should address these with her primary MD or GI  PN  Spoke with patient and advised of recommendations.  She is calling GI today to get scheduled.    She asked me to cancel the appointment with Dr. Elease Hashimoto on 01/29/23 since she is going to follow w GI.

## 2023-01-07 ENCOUNTER — Other Ambulatory Visit (HOSPITAL_BASED_OUTPATIENT_CLINIC_OR_DEPARTMENT_OTHER): Payer: Self-pay

## 2023-01-29 ENCOUNTER — Ambulatory Visit: Payer: Medicare PPO | Admitting: Cardiovascular Disease

## 2023-02-16 ENCOUNTER — Other Ambulatory Visit (HOSPITAL_BASED_OUTPATIENT_CLINIC_OR_DEPARTMENT_OTHER): Payer: Self-pay

## 2023-02-16 MED ORDER — COMIRNATY 30 MCG/0.3ML IM SUSY
0.3000 mL | PREFILLED_SYRINGE | Freq: Once | INTRAMUSCULAR | 0 refills | Status: AC
  Filled 2023-02-16: qty 0.3, 1d supply, fill #0

## 2023-05-26 ENCOUNTER — Other Ambulatory Visit: Payer: Self-pay | Admitting: Cardiovascular Disease

## 2023-05-26 DIAGNOSIS — I480 Paroxysmal atrial fibrillation: Secondary | ICD-10-CM

## 2023-05-26 NOTE — Telephone Encounter (Signed)
Prescription refill request for Eliquis received. Indication: PAF Last office visit: 07/06/22  P Nahser MD Scr: 0.92 on 07/28/22  Epic Age: 77 Weight: 129.5kg  Based on above findings Eliquis 5mg  twice daily is the appropriate dose.  Refill approved.

## 2023-06-10 ENCOUNTER — Telehealth: Payer: Self-pay | Admitting: Cardiovascular Disease

## 2023-06-10 DIAGNOSIS — I10 Essential (primary) hypertension: Secondary | ICD-10-CM

## 2023-06-10 DIAGNOSIS — R739 Hyperglycemia, unspecified: Secondary | ICD-10-CM

## 2023-06-10 DIAGNOSIS — E782 Mixed hyperlipidemia: Secondary | ICD-10-CM

## 2023-06-10 DIAGNOSIS — I480 Paroxysmal atrial fibrillation: Secondary | ICD-10-CM

## 2023-06-10 NOTE — Telephone Encounter (Signed)
 Patient stated she has a visit scheduled on 06/22/23 and she is diabetic and will not be able to fast for any length of time.  Patient wants to know if she can have blood work done prior to that visit.  Patient stated she will need orders for her lab work.

## 2023-06-10 NOTE — Telephone Encounter (Signed)
 Orders placed and pt made aware. Informed of [facility]'s protocol. Released so that she can have drawn at her convenience.

## 2023-06-14 ENCOUNTER — Telehealth: Payer: Self-pay

## 2023-06-14 DIAGNOSIS — M79661 Pain in right lower leg: Secondary | ICD-10-CM

## 2023-06-14 NOTE — Telephone Encounter (Signed)
 Caller: Patient  Concern: Upon waking, pt went downstairs to make breakfast without compression stockings on. She noticed that her leg felt funny and heavy, with a pain behind her knee. She took her food back upstairs to watch TV while eating and put her compressions on.   She denies any warmth or redness and today it feels improved. She denies any other symptoms and states that leg has felt funny ever since her bypass surgery. She has been compliant with her Eliquis  and ASA, but has had a small DVT prior while taking them.  Location: right leg  Description:  06/13/23 AM  Treatments:  Encouraged elevation, compressions, and waling as normal  Resolution: Appointment scheduled for DVT US   Next Appt: Appointment scheduled for 06/15/23 @ 1530. Will schedule MD/PA appt based on US  results

## 2023-06-15 ENCOUNTER — Ambulatory Visit (HOSPITAL_COMMUNITY)
Admission: RE | Admit: 2023-06-15 | Discharge: 2023-06-15 | Disposition: A | Discharge status: Home / Self Care | Payer: Medicare PPO | Source: Ambulatory Visit | Attending: Vascular Surgery | Admitting: Vascular Surgery

## 2023-06-15 DIAGNOSIS — M79661 Pain in right lower leg: Secondary | ICD-10-CM | POA: Diagnosis present

## 2023-06-18 LAB — LIPID PANEL
Chol/HDL Ratio: 2.6 {ratio} (ref 0.0–4.4)
Cholesterol, Total: 132 mg/dL (ref 100–199)
HDL: 50 mg/dL (ref >39)
LDL Chol Calc (NIH): 63 mg/dL (ref 0–99)
Triglycerides: 100 mg/dL (ref 0–149)
VLDL Cholesterol Cal: 19 mg/dL (ref 5–40)

## 2023-06-18 LAB — HEMOGLOBIN A1C
Est. average glucose Bld gHb Est-mCnc: 154 mg/dL
Hgb A1c MFr Bld: 7 % — ABNORMAL HIGH (ref 4.8–5.6)

## 2023-06-18 LAB — CBC
Hematocrit: 50.8 % — ABNORMAL HIGH (ref 34.0–46.6)
Hemoglobin: 16.2 g/dL — ABNORMAL HIGH (ref 11.1–15.9)
MCH: 30.2 pg (ref 26.6–33.0)
MCHC: 31.9 g/dL (ref 31.5–35.7)
MCV: 95 fL (ref 79–97)
Platelets: 244 10*3/uL (ref 150–450)
RBC: 5.37 x10E6/uL — ABNORMAL HIGH (ref 3.77–5.28)
RDW: 12.9 % (ref 11.7–15.4)
WBC: 9.5 10*3/uL (ref 3.4–10.8)

## 2023-06-18 LAB — BASIC METABOLIC PANEL
BUN/Creatinine Ratio: 25 (ref 12–28)
BUN: 23 mg/dL (ref 8–27)
CO2: 28 mmol/L (ref 20–29)
Calcium: 10.1 mg/dL (ref 8.7–10.3)
Chloride: 101 mmol/L (ref 96–106)
Creatinine, Ser: 0.91 mg/dL (ref 0.57–1.00)
Glucose: 141 mg/dL — ABNORMAL HIGH (ref 70–99)
Potassium: 4 mmol/L (ref 3.5–5.2)
Sodium: 142 mmol/L (ref 134–144)
eGFR: 65 mL/min/{1.73_m2} (ref >59)

## 2023-06-18 LAB — ALT: ALT: 28 [IU]/L (ref 0–32)

## 2023-06-19 NOTE — Progress Notes (Signed)
 Cardiology Office Note   Date:  06/22/2023   ID:  Robin, Anthony 07/26/1945, MRN [id number]  PCP:  [name], MD  Cardiologist:   [name], MD   Chief Complaint  Patient presents with   Hypertension        Atrial Fibrillation          [name] is a 78 y.o. female who presents for follow up of her paroxysmal atrial fib  1. Hypertension 2. History of atrial fibrillation 3. Hyperlipidemia 4.  Knee replacement 5.  melanoma       [name] is doing well.  She has decreased her dose of Maxzide  and her BP has been elevated.  She has not been exercising on a regular basis but has been trying to exercise more since New's Years Day.  She has lost 6 pounds.  She has not had any chest pain or dyspnea.   She did feel a little bit of extra salt recently.   January 02, 2013:  [name] has done well.  She had some various aches and pains (chest , back)   in Feb.  The pains improved with omeprazol but it caused excessive gas and bloating.  Her lipids have been minimally elevated.    07/05/2013:  [name] is doing well.   She has a sinus infection recently.     January 01, 2014:  [name] is doing well.  She has had some sinus infections.    Jan. 25, 2016:  [name] is doing well. No cardiac issues.  Has had a few palpitations - heart racing.  But her HR would be normal if she takes her pulse.  Rides her stationary bike 30 mninutes a day.   Jan. 24, 2017:  [name] is seen for follow up of her atrial fib.  CHADS2VASC = 3 ( female, HTN, AGE > 65)  She has PAF - was in NSR at her last visit. Is back in A-Fib today .  Asymptomatic.  No CP or dyspnea  Has occasional right sided chest pain .   Has right shoulder pain and is seeing Dr. Melita soon Had a Myoivew in 2012 - ( inf. Defect) .  Subsequent cath showed normal coronary arteries.   December 19, 2015: [name] is back in NSR today  Has PAF , has never needed cardioversion. Takes propranolol  as  needed. Has some dizziness / orthostasis  Some of her dizziness is not related to orthostasis   Jan. 29, 2018:  [name] is seen today .    Has been having problems with her hemmorhoids.   Asked about watchman  I have suggested that she get her hemorrhoids fixed and we resume anticoagulation shortly thereafter.  She has occasional episodes of brief presyncope. Typically occur if she sitting down. Has never had complete syncope   Sept. 7 , 2018:  Has had several episodes of lightneadedness. Typically when she just stands up.   Also can have lightheadedness when she turns her head Has not had true syncope Has run out of potassium 2 weeks ago   September 08, 2017:  [name] is seen today  Dislocated her Right knee,   Required vascular surgery ( popliteal artery rupture) Was in rehab for a month Developed MSRS Was then found to have melanoma  Bilateral cataract removal.   Needs to have left hip replacement but they will not do it until she has lost about 25 lbs.   March 02, 2018:  [name] is seen today  has been having issues with abdominal cramping. Had chills and sweats Sunday night.     Wt. Is 285. 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 4, 2021  [name] is seen today for folow up of her PAF , HTN , obesity 3-4 weeks ago. Had some lightheadedness,  Difficulty taking a deep breath.  Had some mild CP .   Wt is 281 lbs  Is still having intermittant CP  She had labs at her primary MD.  CBC was unremarkable.  Basic metabolic profile was normal.  Creatinine 0.82.  Glucose is 84.  Sodium is 142.  Potassium is 3.6.  Liver enzymes are normal.  Total cholesterol is 166.  Triglyceride levels 103.  HDL is 46.  LDL is 101.  TSH is 1.85.  Hemoglobin A1c is 6.0.  Troponin I is less than 0.01.  Vitamin B-12 level is 687 which is normal.  Has had some PAF .  Is in NSR today  Takes propranolol  as needed  for   Feb. 1, 2022: [name] is seen today for follow up of her HTN, PAF, obesity Clem[place] recently and cut her prox joint finger  / compound fracture  Has gained some weight . Lots of swelling in right leg.   On eliquis .    Is in NSR today  She had a DVT last year Has been riding stationary bike.   No CP   Jan. 31, 2023 [name] is seen today for follow up of her HTN, PAF, obesity  Wt is 278 lbs  Has had more surgery on her finger   L hip replacement in Aug  Needs to have a wisdom tooth taken out She will be at low risk for this procedure She may hold her Eliquis  for 2 days prior to the procedure    Jan. 29, 2024 [name] is seen for follow up of her HTN, PAF, obesity Wt is 285 lbs .   Had her shoulder replaced in Oct. 2023 Has done well   Breathing is ok Trying to get some exercise  Pulled her back  Doing some yard work .   Advised more diet / exercise/ weight loss   Rosuvastatin  has caused her glucose to go up   BP is elevated.    Starting valsartan  80 QD  Lipids, , BMP , ALT in 3 weeks    Jan. 14, 2025 [name] is seen for follow up of her PAF , obesity , hyperglycemia  Wt is 285 lbs  Has been working on her diet  Breathing is ok   Admits to eating too many carbs      Past Medical History:  Diagnosis Date   Abnormal Pap smear    Rare ASCUS-H   Arthritis    Atrial fibrillation (HCC)    Cancer (HCC)    melanoma rt leg   Chest pain    Diverticulitis    DVT (deep venous thrombosis) (HCC)    GERD (gastroesophageal reflux disease)    Headache    HTN (hypertension) 07/02/2015   Hypercholesteremia    Hyperglycemia 02/04/2011   Hyperlipidemia 02/04/2011   Hypertension    per pt no htn but needs meds for other reason   Hypokalemia 05/17/2017   Injury of right popliteal artery 05/17/2017   Knee dislocation 05/17/2017   Leg edema 02/04/2011   Leg swelling 02/02/2011   Leukocytosis  05/17/2017   MRSA (methicillin resistant Staphylococcus aureus)    R leg    Obesity    PAF (paroxysmal atrial fibrillation) (HCC) 02/02/2011   Perforated sigmoid colon (HCC)    from diverticulitis   Popliteal artery injury 05/17/2017   Pre-diabetes     Past Surgical History:  Procedure Laterality Date   ABDOMINAL AORTOGRAM W/LOWER EXTREMITY Bilateral 02/19/2020   Procedure: ABDOMINAL AORTOGRAM W/LOWER EXTREMITY;  Surgeon: Sheree Penne Bruckner, MD;  Location: [facility] INVASIVE CV LAB;  Service: Cardiovascular;  Laterality: Bilateral;  Rt.  lower  leg   ABDOMINAL HYSTERECTOMY     broken finger     BYPASS GRAFT POPLITEAL TO POPLITEAL Right 05/17/2017   Procedure: BYPASS GRAFT ABOVE KNEE POPLITEAL TO BELOW KNEE POPLITEAL USING NONREVERSED RIGHT GREATER SAPHENOUS VEIN;  Surgeon: Sheree Penne Bruckner, MD;  Location: [facility] OR;  Service: Vascular;  Laterality: Right;   CARDIAC CATHETERIZATION  07/11/2010   Est. EF of 60-65% -- Smooth and normal coronary arteries  -- Normal LV systolic function    CATARACT EXTRACTION, BILATERAL     08-12-17, 08-26-17   CHOLECYSTECTOMY     CYSTOCELE REPAIR  07/22/2010   Vault suspension, cystocele repair, graft and cystoscopy   FINGER SURGERY     MELANOMA EXCISION     right leg 3/12   NASAL SINUS SURGERY     NEUROMA SURGERY     rt foot   REVERSE SHOULDER ARTHROPLASTY Right 04/02/2022   Procedure: REVERSE SHOULDER ARTHROPLASTY;  Surgeon: Melita Drivers, MD;  Location: [facility] ORS;  Service: Orthopedics;  Laterality: Right;    TONSILLECTOMY     TOTAL HIP ARTHROPLASTY Left 01/28/2021   Procedure: TOTAL HIP ARTHROPLASTY ANTERIOR APPROACH;  Surgeon: Ernie Cough, MD;  Location: [facility] ORS;  Service: Orthopedics;  Laterality: Left;   TOTAL KNEE ARTHROPLASTY  06/20/2009   left knee   TOTAL KNEE ARTHROPLASTY  12/27/2008   right knee   TOTAL VAGINAL HYSTERECTOMY  07/22/2010   with bilateral salpingo-oophorectomy by Dr. Mandy   VEIN HARVEST Right 05/17/2017   Procedure: RIGHT GREATER SAPHENOUS VEIN HARVEST;  Surgeon: Sheree Penne Bruckner, MD;  Location: [facility] OR;  Service: Vascular;  Laterality: Right;   WISDOM TOOTH EXTRACTION Right 08/14/2021   Lamar Sabal DDS     Current Outpatient Medications  Medication Sig Dispense Refill   amoxicillin  (AMOXIL ) 500 MG capsule Take 2,000 mg by mouth See admin instructions. Take 2000 mg by mouth 1 hour prior to dental appointment     Ascorbic Acid (VITAMIN C) 1000 MG tablet Take 1,000 mg by mouth daily.     aspirin  EC 81 MG tablet Take 81 mg by mouth daily.      Biotin 5000 MCG TABS Take 5,000 mcg by mouth daily.     Calcium  Carb-Cholecalciferol (CALCIUM  + D3 PO) Take 1 tablet by mouth daily.     conjugated estrogens  (PREMARIN ) vaginal cream 1/2 gram vaginally twice weekly 30 g 1   COVID-19 mRNA bivalent vaccine, Pfizer, (PFIZER COVID-19 VAC BIVALENT) injection Inject into the muscle. 0.3 mL 0   COVID-19 mRNA vaccine 2023-2024 (COMIRNATY ) syringe Inject into the muscle. 0.3 mL 0   diltiazem  (CARDIZEM  CD) 240 MG 24 hr capsule TAKE 1 CAPSULE (240 MG TOTAL) BY MOUTH DAILY. 90 capsule 3   docusate sodium  (COLACE) 100 MG capsule Take 1 capsule (100 mg total) by mouth 2 (two) times daily. 10 capsule 0   ELIQUIS  5 MG TABS tablet TAKE 1 TABLET BY MOUTH 2 TIMES DAILY. 60 tablet  5   famotidine  (PEPCID ) 20 MG tablet Take 20 mg by mouth daily.     fluticasone  (FLONASE ) 50 MCG/ACT nasal spray Place 1-2 sprays into both nostrils at bedtime as needed for allergies.     loratadine  (CLARITIN ) 10 MG tablet Take 10 mg by mouth daily.     Multiple Vitamin (MULTIVITAMIN WITH MINERALS) TABS tablet Take 1 tablet by mouth daily.     nystatin cream (MYCOSTATIN) Apply 1 Application topically daily as needed (yeast).     PATADAY  0.2 % SOLN Place 1 drop into both eyes daily.   4   potassium chloride  SA (KLOR-CON  M) 20 MEQ tablet Take 1 tablet (20 mEq total) by mouth daily. 90 tablet 3   pravastatin  (PRAVACHOL ) 80 MG tablet Take 1 tablet (80 mg total) by mouth every evening. 90 tablet 3   propranolol   (INDERAL ) 10 MG tablet Take 1 tablet (10 mg total) by mouth 4 (four) times daily as needed (palpitations or fast heart rate). 60 tablet 6   psyllium (REGULOID) 0.52 g capsule Take 2.6 g by mouth 4 (four) times daily.     triamterene -hydrochlorothiazide  (MAXZIDE ) 75-50 MG tablet Take 1 tablet by mouth daily.     No current facility-administered medications for this visit.    Allergies:   Sulfa antibiotics, Sulfonamide derivatives, Milk-related compounds, Atenolol, Erythromycin, and Plavix [clopidogrel bisulfate]    Social History:  The patient  reports that she has never smoked. She has never used smokeless tobacco. She reports that she does not drink alcohol and does not use drugs.   Family History:  The patient's family history includes Atrial fibrillation in her mother; Diabetes in her mother; Emphysema in her father; Hypertension in her mother; Other in her brother.    ROS:  Please see the history of present illness.   Otherwise, review of systems are positive for none.   All other systems are reviewed and negative.     Physical Exam: Blood pressure 128/88, pulse 76, height 5' 8.5 (1.74 m), weight 284 lb 9.6 oz (129.1 kg), last menstrual period 07/22/2010, SpO2 96%.       GEN:  morbidly obese , elderly female  in no acute distress HEENT: Normal NECK: No JVD; No carotid bruits LYMPHATICS: No lymphadenopathy CARDIAC: irreg. Irreg.  RESPIRATORY:  Clear to auscultation without rales, wheezing or rhonchi  ABDOMEN: Soft, non-tender, non-distended MUSCULOSKELETAL:  No edema; No deformity  SKIN: Warm and dry NEUROLOGIC:  Alert and oriented x 3   EKG:    EKG Interpretation Date/Time:  Tuesday June 22 2023 09:22:17 EST Ventricular Rate:  85 PR Interval:    QRS Duration:  138 QT Interval:  412 QTC Calculation: 490 R Axis:   110  Text Interpretation: Atrial fibrillation with a competing junctional pacemaker Right bundle branch block When compared with ECG of 17-Jun-2009  14:16, Atrial fibrillation has replaced Sinus rhythm Right bundle branch block is now Present Confirmed by Alveta Mungo ([id number]) on 06/22/2023 9:49:12 AM      Recent Labs: 06/17/2023: ALT 28; BUN 23; Creatinine, Ser 0.91; Hemoglobin 16.2; Platelets 244; Potassium 4.0; Sodium 142    Lipid Panel    Component Value Date/Time   CHOL 132 06/17/2023 0807   TRIG 100 06/17/2023 0807   HDL 50 06/17/2023 0807   CHOLHDL 2.6 06/17/2023 0807   CHOLHDL 3.4 07/02/2015 0737   VLDL 16 07/02/2015 0737   LDLCALC 63 06/17/2023 0807      Wt Readings from Last 3 Encounters:  06/22/23 284 lb  9.6 oz (129.1 kg)  09/30/22 279 lb (126.6 kg)  07/06/22 285 lb 9.6 oz (129.5 kg)      Other studies Reviewed: Additional studies/ records that were reviewed today include: . Review of the above records demonstrates:    ASSESSMENT AND PLAN:  1.  Persistent l atrial fib:        That he remains in atrial fibrillation.  We will continue with anticoagulation and rate control.  2. Hypertension:    Blood pressure is well-controlled.     3. Hyperlipidemia:    Lipids look quite a bit better.  She is on pravastatin .  I have strongly encouraged her to work on diet, exercise, weight loss.     4. Obesity:      Will refer her to the [facility] weight loss program.  She would also like a dietary nutritionist consult.     Current medicines are reviewed at length with the patient today.  The patient does not have concerns regarding medicines.  The following changes have been made:  no change  Labs/ tests ordered today include:   Orders Placed This Encounter  Procedures   EKG 12-Lead      Disposition:   1 year office visit with Dr. Sheena     Signed, [name], MD  06/22/2023 9:49 AM    [facility] [address] [place], [place], [place]  [address] Phone: [phone]; Fax: [phone]

## 2023-06-22 ENCOUNTER — Encounter: Payer: Self-pay | Admitting: Cardiovascular Disease

## 2023-06-22 ENCOUNTER — Ambulatory Visit: Payer: Medicare PPO | Attending: Cardiovascular Disease | Admitting: Cardiovascular Disease

## 2023-06-22 VITALS — BP 128/88 | HR 76 | Ht 68.5 in | Wt 284.6 lb

## 2023-06-22 DIAGNOSIS — R739 Hyperglycemia, unspecified: Secondary | ICD-10-CM | POA: Diagnosis not present

## 2023-06-22 DIAGNOSIS — I480 Paroxysmal atrial fibrillation: Secondary | ICD-10-CM | POA: Diagnosis not present

## 2023-06-22 DIAGNOSIS — Z6841 Body Mass Index (BMI) 40.0 and over, adult: Secondary | ICD-10-CM

## 2023-06-22 DIAGNOSIS — R7309 Other abnormal glucose: Secondary | ICD-10-CM | POA: Diagnosis not present

## 2023-06-22 DIAGNOSIS — E66813 Obesity, class 3: Secondary | ICD-10-CM | POA: Diagnosis not present

## 2023-06-22 NOTE — Patient Instructions (Signed)
 Testing/Procedures: Dietician consult Ambulatory referral to medical weight management  Follow-Up: At [facility], you and your health needs are our priority.  As part of our continuing mission to provide you with exceptional heart care, we have created designated Provider Care Teams.  These Care Teams include your primary Cardiologist (physician) and Advanced Practice Providers (APPs -  Physician Assistants and Nurse Practitioners) who all work together to provide you with the care you need, when you need it.  Your next appointment:   1 year(s)  Provider:   Kardie Tobb, MD

## 2023-06-29 ENCOUNTER — Other Ambulatory Visit: Payer: Self-pay | Admitting: Cardiovascular Disease

## 2023-08-11 ENCOUNTER — Encounter (INDEPENDENT_AMBULATORY_CARE_PROVIDER_SITE_OTHER): Payer: Self-pay

## 2023-08-24 ENCOUNTER — Other Ambulatory Visit: Payer: Self-pay | Admitting: Cardiovascular Disease

## 2023-09-09 ENCOUNTER — Other Ambulatory Visit: Payer: Self-pay | Admitting: Family Medicine

## 2023-09-09 DIAGNOSIS — Z13820 Encounter for screening for osteoporosis: Secondary | ICD-10-CM

## 2023-09-09 DIAGNOSIS — Z1231 Encounter for screening mammogram for malignant neoplasm of breast: Secondary | ICD-10-CM

## 2023-09-30 ENCOUNTER — Encounter: Payer: Self-pay | Admitting: Allergy & Immunology

## 2023-09-30 ENCOUNTER — Other Ambulatory Visit: Payer: Self-pay

## 2023-09-30 ENCOUNTER — Ambulatory Visit: Admitting: Allergy & Immunology

## 2023-09-30 VITALS — BP 120/90 | HR 77 | Temp 97.2°F | Resp 12 | Ht 67.32 in | Wt 286.2 lb

## 2023-09-30 DIAGNOSIS — K219 Gastro-esophageal reflux disease without esophagitis: Secondary | ICD-10-CM

## 2023-09-30 DIAGNOSIS — J4530 Mild persistent asthma, uncomplicated: Secondary | ICD-10-CM

## 2023-09-30 DIAGNOSIS — J310 Chronic rhinitis: Secondary | ICD-10-CM

## 2023-09-30 NOTE — Progress Notes (Signed)
 NEW PATIENT  Date of Service/Encounter:  09/30/23  Consult requested by: Candiss Chamorro, MD   Assessment:   Chronic rhinitis - planning for skin testing at the next visit  Mild persistent asthma, uncomplicated  Atrial fibrillation - on blood thinners  Plan/Recommendations:   1. Chronic rhinitis - Because of insurance stipulations, we cannot do skin testing on the same day as your first visit. - We are all working to fight this, but for now we need to do two separate visits.  - We will know more after we do testing at the next visit.  - The skin testing visit can be squeezed in at your convenience.  - Then we can make a more full plan to address all of your symptoms. - Be sure to stop your antihistamines for 3 days before this appointment.   2. Mild persistent asthma, uncomplicated - Lung testing looks great today. - We are not going to make any changes at this point in time.   3. Return in about 1 week (around 10/07/2023) for SKIN TESTING (1-55). You can have the follow up appointment with Dr. Idolina Anthony or a Nurse Practicioner (our Nurse Practitioners are excellent and always have Physician oversight!).    This note in its entirety was forwarded to the Provider who requested this consultation.  Subjective:   [name] is a 78 y.o. female presenting today for evaluation of  Chief Complaint  Patient presents with   Establish Care   Wheezing    Maybe due to pollen.   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   Aortic atherosclerosis (HCC) 12/31/2020   Closed fracture of base of proximal phalanx of finger 07/02/2020   Primary localized osteoarthrosis of ankle and foot 04/10/2020   Pain in left foot 04/10/2020   Colonic diverticular abscess 12/23/2018   Morbid obesity with body mass index (BMI) of 40.0 or higher (HCC) 12/11/2018   On  continuous oral anticoagulation 12/11/2018   Atrial fibrillation (HCC) 12/11/2018   Perforated bowel (HCC) 12/10/2018   Pain in right foot 02/23/2018   Pain of left hip joint 08/11/2017   Stiffness of right knee 07/28/2017   S/P total knee replacement 07/02/2017   Trochanteric bursitis of left hip 07/02/2017   Knee dislocation 05/17/2017   Popliteal artery injury 05/17/2017   Hypokalemia 05/17/2017   Leukocytosis 05/17/2017   Injury of right popliteal artery 05/17/2017   HTN (hypertension) 07/02/2015   Hyperlipidemia 02/04/2011   Hyperglycemia 02/04/2011   Leg edema 02/04/2011   PAF (paroxysmal atrial fibrillation) (HCC) 02/02/2011   Leg swelling 02/02/2011    History obtained from: chart review and patient.  Discussed the use of AI scribe software for clinical note transcription with the patient and/or guardian, who gave verbal consent to proceed.  [name] was referred by Candiss Chamorro, MD.     [name] is a 78 y.o. female presenting for an evaluation of environmental allergies .  Asthma/Respiratory Symptom History: Recently, she was prescribed Trelegy after crackles were noted in her lungs, which seemed to improve her symptoms within four days. She has used nine out of fourteen doses and takes it intermittently when symptoms worsen. She also uses Tessalon Perles at night to manage her cough and aid sleep.  She has never been diagnosed with asthma.  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 She was seen by Dr. Socorro Dunks way back when. She has been using Pataday  for itchy eyes and has a history of being sick often due to exposure to children with poor hygiene practices.  She has experienced worsening allergy symptoms this year, particularly due to pollen exposure, causing significant discomfort. Her symptoms include tightness in the  chest, a clogged head, and a nonproductive cough persisting for about three and a half weeks. She has been using Mucinex  and nasal spray at night to manage these symptoms.  GERD Symptom History: She is on famotidine  20 mg daily.  This seems to be controlling her symptoms adequately.  Infection Symptom History: In the past year, she experienced a fever and was tested for various conditions, including RSV, COVID, and flu, but all tests were negative. She was treated with Augmentin , which resolved her fever but led to a subsequent sinus infection and gastrointestinal issues.  She thinks that she tends to get sick around once per year.  Her current medications include Eliquis  and a baby aspirin  for atrial fibrillation, as well as a statin and potassium. She has a history of atrial fibrillation and is under the care of a cardiologist.   Otherwise, there is no history of other atopic diseases, including drug allergies, stinging insect allergies, or contact dermatitis. There is no significant infectious history. Vaccinations are up to date.    Past Medical History: Patient Active Problem List   Diagnosis Date Noted   S/P left total hip arthroplasty 01/28/2021   Aortic atherosclerosis (HCC) 12/31/2020   Closed fracture of base of proximal phalanx of finger 07/02/2020   Primary localized osteoarthrosis of ankle and foot 04/10/2020   Pain in left foot 04/10/2020   Colonic diverticular abscess 12/23/2018   Morbid obesity with body mass index (BMI) of 40.0 or higher (HCC) 12/11/2018   On continuous oral anticoagulation 12/11/2018   Atrial fibrillation (HCC) 12/11/2018   Perforated bowel (HCC) 12/10/2018   Pain in right foot 02/23/2018   Pain of left hip joint 08/11/2017   Stiffness of right knee 07/28/2017   S/P total knee replacement 07/02/2017   Trochanteric bursitis of left hip 07/02/2017   Knee dislocation 05/17/2017   Popliteal artery injury 05/17/2017   Hypokalemia 05/17/2017   Leukocytosis  05/17/2017   Injury of right popliteal artery 05/17/2017   HTN (hypertension) 07/02/2015   Hyperlipidemia 02/04/2011   Hyperglycemia 02/04/2011   Leg edema 02/04/2011   PAF (paroxysmal atrial fibrillation) (HCC) 02/02/2011   Leg swelling 02/02/2011    Medication List:  Allergies as of 09/30/2023       Reactions   Sulfa Antibiotics Other (See Comments)   Turned eyes yellow    Sulfonamide Derivatives Rash   Turned eyes yellow   Milk-related Compounds Diarrhea   Atenolol Cough   Erythromycin Nausea Only   Plavix [clopidogrel Bisulfate] Cough        Medication List        Accurate as of September 30, 2023  4:12 PM. If you have any questions, ask your nurse or doctor.          amoxicillin  500 MG capsule Commonly known as: AMOXIL  Take 2,000 mg by mouth See admin instructions. Take 2000 mg by mouth 1 hour prior to dental appointment   aspirin  EC 81 MG tablet Take 81 mg by mouth daily.   Biotin 5000 MCG Tabs Take 5,000 mcg by mouth  daily.   CALCIUM  + D3 PO Take 1 tablet by mouth daily.   Comirnaty  syringe Generic drug: COVID-19 mRNA vaccine (Pfizer) Inject into the muscle.   diltiazem  240 MG 24 hr capsule Commonly known as: CARDIZEM  CD TAKE 1 CAPSULE (240 MG TOTAL) BY MOUTH DAILY.   docusate sodium  100 MG capsule Commonly known as: COLACE Take 1 capsule (100 mg total) by mouth 2 (two) times daily.   Eliquis  5 MG Tabs tablet Generic drug: apixaban  TAKE 1 TABLET BY MOUTH 2 TIMES DAILY.   famotidine  20 MG tablet Commonly known as: PEPCID  Take 20 mg by mouth daily.   fluticasone  50 MCG/ACT nasal spray Commonly known as: FLONASE  Place 1-2 sprays into both nostrils at bedtime as needed for allergies.   loratadine  10 MG tablet Commonly known as: CLARITIN  Take 10 mg by mouth daily.   multivitamin with minerals Tabs tablet Take 1 tablet by mouth daily.   nystatin cream Commonly known as: MYCOSTATIN Apply 1 Application topically daily as needed (yeast).    Pataday  0.2 % Soln Generic drug: Olopatadine  HCl Place 1 drop into both eyes daily.   Pfizer COVID-19 Vac Bivalent injection Generic drug: COVID-19 mRNA bivalent vaccine Proofreader) Inject into the muscle.   potassium chloride  SA 20 MEQ tablet Commonly known as: KLOR-CON  M TAKE 1 TABLET (20 MEQ TOTAL) BY MOUTH DAILY.   pravastatin  80 MG tablet Commonly known as: PRAVACHOL  TAKE 1 TABLET (80 MG TOTAL) BY MOUTH EVERY EVENING. REPLACES CRESTOR  (ROSUVASTATIN )   Premarin  vaginal cream Generic drug: conjugated estrogens  1/2 gram vaginally twice weekly   propranolol  10 MG tablet Commonly known as: INDERAL  Take 1 tablet (10 mg total) by mouth 4 (four) times daily as needed (palpitations or fast heart rate).   psyllium 0.52 g capsule Commonly known as: REGULOID Take 2.6 g by mouth 4 (four) times daily.   triamterene -hydrochlorothiazide  75-50 MG tablet Commonly known as: MAXZIDE  Take 1 tablet by mouth daily.   vitamin C 1000 MG tablet Take 1,000 mg by mouth daily.        Birth History: non-contributory  Developmental History: non-contributory  Past Surgical History: Past Surgical History:  Procedure Laterality Date   ABDOMINAL AORTOGRAM W/LOWER EXTREMITY Bilateral 02/19/2020   Procedure: ABDOMINAL AORTOGRAM W/LOWER EXTREMITY;  Surgeon: Adine Hoof, MD;  Location: [facility] INVASIVE CV LAB;  Service: Cardiovascular;  Laterality: Bilateral;  Rt.  lower  leg   ABDOMINAL HYSTERECTOMY     ADENOIDECTOMY     broken finger     BYPASS GRAFT POPLITEAL TO POPLITEAL Right 05/17/2017   Procedure: BYPASS GRAFT ABOVE KNEE POPLITEAL TO BELOW KNEE POPLITEAL USING NONREVERSED RIGHT GREATER SAPHENOUS VEIN;  Surgeon: Adine Hoof, MD;  Location: [facility] OR;  Service: Vascular;  Laterality: Right;   CARDIAC CATHETERIZATION  07/11/2010   Est. EF of 60-65% -- Smooth and normal coronary arteries  -- Normal LV systolic function    CATARACT EXTRACTION, BILATERAL     08-12-17, 08-26-17    CHOLECYSTECTOMY     CYSTOCELE REPAIR  07/22/2010   Vault suspension, cystocele repair, graft and cystoscopy   FINGER SURGERY     MELANOMA EXCISION     right leg 3/12   NASAL SINUS SURGERY     NEUROMA SURGERY     rt foot   REVERSE SHOULDER ARTHROPLASTY Right 04/02/2022   Procedure: REVERSE SHOULDER ARTHROPLASTY;  Surgeon: Ellard Gunning, MD;  Location: [facility] ORS;  Service: Orthopedics;  Laterality: Right;    TONSILLECTOMY     TOTAL HIP  ARTHROPLASTY Left 01/28/2021   Procedure: TOTAL HIP ARTHROPLASTY ANTERIOR APPROACH;  Surgeon: Claiborne Crew, MD;  Location: [facility] ORS;  Service: Orthopedics;  Laterality: Left;   TOTAL KNEE ARTHROPLASTY  06/20/2009   left knee   TOTAL KNEE ARTHROPLASTY  12/27/2008   right knee   TOTAL VAGINAL HYSTERECTOMY  07/22/2010   with bilateral salpingo-oophorectomy by Dr. Denton Flakes   VEIN HARVEST Right 05/17/2017   Procedure: RIGHT GREATER SAPHENOUS VEIN HARVEST;  Surgeon: Adine Hoof, MD;  Location: [facility] OR;  Service: Vascular;  Laterality: Right;   WISDOM TOOTH EXTRACTION Right 08/14/2021   Kermit Ped DDS     Family History: Family History  Problem Relation Age of Onset   Atrial fibrillation Mother        has pacemaker   Diabetes Mother    Hypertension Mother    Emphysema Father    Other Brother        bulbar palsy   Asthma Paternal Aunt    Asthma Paternal Uncle    Breast cancer Neg Hx      Social History: Tabbitha lives at home with her family. She lives in a house that was built in 1948. There are hardwoods in the main living area and carpeting in the bedroom. There is gas heating and central cooling. There is a dog inside of the home as well as a cat inside of the house. There are no dust mite coverings on the bedding. There is exposure to fumes, chemical, and dust in her home. There is no HEPA filter in the home. She does not live near an interstate or industrial area.  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   Review of systems otherwise negative other than that mentioned in the HPI.    Objective:   Blood pressure (!) 120/90, pulse 77, temperature (!) 97.2 F (36.2 C), resp. rate 12, height 5' 7.32" (1.71 m), weight 286 lb 3.2 oz (129.8 kg), last menstrual period 07/22/2010, SpO2 96%. Body mass index is 44.4 kg/m.     Physical Exam Vitals reviewed.  Constitutional:      Appearance: She is well-developed.  HENT:     Head: Normocephalic and atraumatic.     Right Ear: Tympanic membrane, ear canal and external ear normal. No drainage, swelling or tenderness. Tympanic membrane is not injected, scarred, erythematous, retracted or bulging.     Left Ear: Tympanic membrane, ear canal and external ear normal. No drainage, swelling or tenderness. Tympanic membrane is not injected, scarred, erythematous, retracted or bulging.     Nose: Mucosal edema and rhinorrhea present. No nasal deformity or septal deviation.     Right Turbinates: Enlarged, swollen and pale.     Left Turbinates: Enlarged, swollen and pale.     Right Sinus: No maxillary sinus tenderness or frontal sinus tenderness.     Left Sinus: No maxillary sinus tenderness or frontal sinus tenderness.     Mouth/Throat:     Mouth: Mucous membranes are not pale and not dry.     Pharynx: Uvula midline.  Eyes:     General:        Right eye: No discharge.        Left eye: No discharge.     Conjunctiva/sclera: Conjunctivae normal.     Right eye: Right conjunctiva is not injected. No chemosis.    Left eye: Left conjunctiva is not injected. No chemosis.    Pupils: Pupils are equal, round, and  reactive to light.  Cardiovascular:     Rate and Rhythm: Normal rate and regular rhythm.     Heart sounds: Normal heart sounds.  Pulmonary:     Effort: Pulmonary effort is normal. No tachypnea, accessory muscle usage or respiratory distress.     Breath sounds: Normal breath sounds. No wheezing, rhonchi or rales.  Chest:      Chest wall: No tenderness.  Abdominal:     Tenderness: There is no abdominal tenderness. There is no guarding or rebound.  Lymphadenopathy:     Head:     Right side of head: No submandibular, tonsillar or occipital adenopathy.     Left side of head: No submandibular, tonsillar or occipital adenopathy.     Cervical: No cervical adenopathy.  Skin:    Coloration: Skin is not pale.     Findings: No abrasion, erythema, petechiae or rash. Rash is not papular, urticarial or vesicular.  Neurological:     Mental Status: She is alert.  Psychiatric:        Behavior: Behavior is cooperative.      Diagnostic studies:    Spirometry: results normal (FEV1: 2.28/103%, FVC: 2.75/94%, FEV1/FVC: 83%).   Spirometry consistent with normal pattern.   Allergy Studies: deferred due to insurance stipulations that require a separate visit for testing         Drexel Gentles, MD [facility]        /

## 2023-09-30 NOTE — Patient Instructions (Addendum)
 1. Chronic rhinitis - Because of insurance stipulations, we cannot do skin testing on the same day as your first visit. - We are all working to fight this, but for now we need to do two separate visits.  - We will know more after we do testing at the next visit.  - The skin testing visit can be squeezed in at your convenience.  - Then we can make a more full plan to address all of your symptoms. - Be sure to stop your antihistamines for 3 days before this appointment.   2. Mild persistent asthma, uncomplicated - Lung testing looks great today. - We are not going to make any changes at this point in time.   3. Return in about 1 week (around 10/07/2023) for SKIN TESTING (1-55). You can have the follow up appointment with Dr. Idolina Maker or a Nurse Practicioner (our Nurse Practitioners are excellent and always have Physician oversight!).    Please inform us  of any Emergency Department visits, hospitalizations, or changes in symptoms. Call us  before going to the ED for breathing or allergy symptoms since we might be able to fit you in for a sick visit. Feel free to contact us  anytime with any questions, problems, or concerns.  It was a pleasure to meet you today! Thank you for the washcloth!   Websites that have reliable patient information: 1. American Academy of Asthma, Allergy, and Immunology: www.aaaai.org 2. Food Allergy Research and Education (FARE): foodallergy.org 3. Mothers of Asthmatics: http://www.asthmacommunitynetwork.org 4. American College of Allergy, Asthma, and Immunology: www.acaai.org      "Like" us  on Facebook and Instagram for our latest updates!      A healthy democracy works best when [employer] participate! Make sure you are registered to vote! If you have moved or changed any of your contact information, you will need to get this updated before voting! Scan the QR codes below to learn more!

## 2023-10-12 ENCOUNTER — Encounter (INDEPENDENT_AMBULATORY_CARE_PROVIDER_SITE_OTHER): Admitting: Adult Health

## 2023-10-14 ENCOUNTER — Ambulatory Visit: Admitting: Allergy & Immunology

## 2023-10-14 DIAGNOSIS — J301 Allergic rhinitis due to pollen: Secondary | ICD-10-CM

## 2023-10-14 MED ORDER — LEVOCETIRIZINE DIHYDROCHLORIDE 5 MG PO TABS: 5.0000 mg | ORAL_TABLET | Freq: Every evening | ORAL | 3 refills | Status: AC

## 2023-10-14 NOTE — Progress Notes (Signed)
 FOLLOW UP  Date of Service/Encounter:  10/14/23   Assessment:   Seasonal allergic rhinitis due to pollen (grasses, ragweed, weeds, and trees)  Plan/Recommendations:   1. Chronic rhinitis - Testing today showed: grasses, ragweed, weeds, and trees - Copy of test results provided.  - Avoidance measures provided. - Stop taking: loratadine  - Start taking: Xyzal  (levocetirizine) 5mg  tablet once daily and Flonase  (fluticasone ) two sprays per nostril daily (AIM FOR EAR ON EACH SIDE) - You can use an extra dose of the antihistamine, if needed, for breakthrough symptoms.  - Consider nasal saline rinses 1-2 times daily to remove allergens from the nasal cavities as well as help with mucous clearance (this is especially helpful to do before the nasal sprays are given) - Consider allergy shots as a means of long-term control. - Allergy shots "re-train" and "reset" the immune system to ignore environmental allergens and decrease the resulting immune response to those allergens (sneezing, itchy watery eyes, runny nose, nasal congestion, etc).    - Allergy shots improve symptoms in 75-85% of patients.  - We can discuss more at the next appointment if the medications are not working for you.  2. Mild persistent asthma, uncomplicated - Lung testing looks great today. - We are not going to make any changes at this point in time.  - Continue with Trelegy one puff daily as needed.   3. Return in about 3 months (around 01/14/2024). You can have the follow up appointment with Dr. Idolina Maker or a Nurse Practicioner (our Nurse Practitioners are excellent and always have Physician oversight!).   Subjective:   [name] is a 78 y.o. female presenting today for follow up of  Chief Complaint  Patient presents with   Allergy Testing    Adult Airborne 1-55    [name] has a history of the following: Patient Active Problem List   Diagnosis Date Noted   S/P left total hip arthroplasty  01/28/2021   Aortic atherosclerosis (HCC) 12/31/2020   Closed fracture of base of proximal phalanx of finger 07/02/2020   Primary localized osteoarthrosis of ankle and foot 04/10/2020   Pain in left foot 04/10/2020   Colonic diverticular abscess 12/23/2018   Morbid obesity with body mass index (BMI) of 40.0 or higher (HCC) 12/11/2018   On continuous oral anticoagulation 12/11/2018   Atrial fibrillation (HCC) 12/11/2018   Perforated bowel (HCC) 12/10/2018   Pain in right foot 02/23/2018   Pain of left hip joint 08/11/2017   Stiffness of right knee 07/28/2017   S/P total knee replacement 07/02/2017   Trochanteric bursitis of left hip 07/02/2017   Knee dislocation 05/17/2017   Popliteal artery injury 05/17/2017   Hypokalemia 05/17/2017   Leukocytosis 05/17/2017   Injury of right popliteal artery 05/17/2017   HTN (hypertension) 07/02/2015   Hyperlipidemia 02/04/2011   Hyperglycemia 02/04/2011   Leg edema 02/04/2011   PAF (paroxysmal atrial fibrillation) (HCC) 02/02/2011   Leg swelling 02/02/2011    History obtained from: chart review and patient.  Discussed the use of AI scribe software for clinical note transcription with the patient and/or guardian, who gave verbal consent to proceed.  [name] is a 78 y.o. female presenting for skin testing. She was last seen on April 24th. We could not do testing because her insurance company does not cover testing on the same day as a New Patient visit. She has been off of all antihistamines 3 days in anticipation of the testing.   At that time, she was  having worsening symptoms after being on allergy shots decades ago with Dr. Socorro Dunks. This year had been particularly terrible. With her allergy symptoms. She had been using Pataday .   Otherwise, there have been no changes to her past medical history, surgical history, family history, or social history.    Review of systems otherwise negative other than that mentioned in the  HPI.    Objective:   Last menstrual period 07/22/2010. There is no height or weight on file to calculate BMI.    Physical exam deferred since this was a skin testing appointment only.   Diagnostic studies:    Spirometry:   Allergy Studies:     Airborne Adult Perc - 10/14/23 1410     Time Antigen Placed 1410    Allergen Manufacturer Floyd Hutchinson    Location Back    Number of Test 55    1. Control-Buffer 50% Glycerol Negative    2. Control-Histamine 3+    3. Bahia Negative    4. French Southern Territories Negative    5. Johnson Negative    6. [place]  Blue Negative    7. Meadow Fescue Negative    8. Perennial Rye Negative    9. Timothy Negative    10. Ragweed Mix Negative    11. Cocklebur Negative    12. Plantain,  English Negative    13. Baccharis Negative    14. Dog Fennel Negative    15. Russian Thistle Negative    16. Lamb's Quarters Negative    17. Sheep Sorrell Negative    18. Rough Pigweed Negative    19. Marsh Elder, Rough Negative    20. Mugwort, Common Negative    21. Box, Elder Negative    22. Cedar, red Negative    23. Sweet Gum Negative    24. Pecan Pollen Negative    25. Pine Mix 3+    26. Walnut, Black Pollen Negative    27. Red Mulberry Negative    28. Ash Mix Negative    29. Birch Mix Negative    30. Beech American Negative    31. Cottonwood, Guinea-Bissau Negative    32. Hickory, White Negative    33. Maple Mix Negative    34. Oak, Guinea-[place]iss[place] Mix Negative    35. Sycamore Eastern Negative    36. Alternaria Alternata Negative    37. Cladosporium Herbarum Negative    38. Aspergillus Mix Negative    39. Penicillium Mix Negative    40. Bipolaris Sorokiniana (Helminthosporium) Negative    41. Drechslera Spicifera (Curvularia) Negative    42. Mucor Plumbeus Negative    43. Fusarium Moniliforme Negative    44. Aureobasidium Pullulans (pullulara) Negative    45. Rhizopus Oryzae Negative    46. Botrytis Cinera Negative    47. Epicoccum Nigrum Negative    48. Phoma Betae  Negative    49. Dust Mite Mix Negative    50. Cat Hair 10,000 BAU/ml Negative    51.  Dog Epithelia Negative    52. Mixed Feathers Negative    53. Horse Epithelia Negative    54. Cockroach, German Negative    55. Tobacco Leaf Negative             Intradermal - 10/14/23 1424     Time Antigen Placed 1430    Allergen Manufacturer Greer    Location Arm    Number of Test 16    Control Negative    Bahia 3+    French Southern Territ[place] 3+    Lincoln Renshaw  Negative    7 Grass 2+    Ragweed Mix 2+    Weed Mix 3+    Tree Mix Negative    Mold 1 Negative    Mold 2 Negative    Mold 3 Negative    Mold 4 Negative    Mite Mix Negative    Cat Negative    Dog Negative    Cockroach Negative             Allergy testing results were read and interpreted by myself, documented by clinical staff.      Drexel Gentles, MD  [facility] 

## 2023-10-14 NOTE — Patient Instructions (Addendum)
 1. Chronic rhinitis - Testing today showed: grasses, ragweed, weeds, and trees - Copy of test results provided.  - Avoidance measures provided. - Stop taking: loratadine  - Start taking: Xyzal (levocetirizine) 5mg  tablet once daily and Flonase  (fluticasone ) two sprays per nostril daily (AIM FOR EAR ON EACH SIDE) - You can use an extra dose of the antihistamine, if needed, for breakthrough symptoms.  - Consider nasal saline rinses 1-2 times daily to remove allergens from the nasal cavities as well as help with mucous clearance (this is especially helpful to do before the nasal sprays are given) - Consider allergy shots as a means of long-term control. - Allergy shots "re-train" and "reset" the immune system to ignore environmental allergens and decrease the resulting immune response to those allergens (sneezing, itchy watery eyes, runny nose, nasal congestion, etc).    - Allergy shots improve symptoms in 75-85% of patients.  - We can discuss more at the next appointment if the medications are not working for you.  2. Mild persistent asthma, uncomplicated - Lung testing looks great today. - We are not going to make any changes at this point in time.  - Continue with Trelegy one puff daily as needed.   3. Return in about 3 months (around 01/14/2024). You can have the follow up appointment with Dr. Idolina Maker or a Nurse Practicioner (our Nurse Practitioners are excellent and always have Physician oversight!).    Please inform us  of any Emergency Department visits, hospitalizations, or changes in symptoms. Call us  before going to the ED for breathing or allergy symptoms since we might be able to fit you in for a sick visit. Feel free to contact us  anytime with any questions, problems, or concerns.  It was a pleasure to meet you today! Thank you for the washcloth!   Websites that have reliable patient information: 1. American Academy of Asthma, Allergy, and Immunology: www.aaaai.org 2. Food Allergy  Research and Education (FARE): foodallergy.org 3. Mothers of Asthmatics: http://www.asthmacommunitynetwork.org 4. American College of Allergy, Asthma, and Immunology: www.acaai.org      "Like" us  on Facebook and Instagram for our latest updates!      A healthy democracy works best when [employer] participate! Make sure you are registered to vote! If you have moved or changed any of your contact information, you will need to get this updated before voting! Scan the QR codes below to learn more!        Airborne Adult Perc - 10/14/23 1410     Time Antigen Placed 1410    Allergen Manufacturer Floyd Hutchinson    Location Back    Number of Test 55    1. Control-Buffer 50% Glycerol Negative    2. Control-Histamine 3+    3. Bahia Negative    4. [place] Negative    5. Johnson Negative    6. [place]  Blue Negative    7. Meadow Fescue Negative    8. Perennial Rye Negative    9. Timothy Negative    10. Ragweed Mix Negative    11. Cocklebur Negative    12. Plantain,  English Negative    13. Baccharis Negative    14. Dog Fennel Negative    15. Russian Thistle Negative    16. Lamb's Quarters Negative    17. Sheep Sorrell Negative    18. Rough Pigweed Negative    19. Marsh Elder, Rough Negative    20. Mugwort, Common Negative    21. Box, Elder Negative    22. Cedar, red  Negative    23. Sweet Gum Negative    24. Pecan Pollen Negative    25. Pine Mix 3+    26. Walnut, Black Pollen Negative    27. Red Mulberry Negative    28. Ash Mix Negative    29. Birch Mix Negative    30. Beech American Negative    31. Cottonwood, Guinea-Bissau Negative    32. Hickory, White Negative    33. Maple Mix Negative    34. Oak, [place] Mix Negative    35. Sycamore Eastern Negative    36. Alternaria Alternata Negative    37. Cladosporium Herbarum Negative    38. Aspergillus Mix Negative    39. Penicillium Mix Negative    40. Bipolaris Sorokiniana (Helminthosporium) Negative    41. Drechslera Spicifera  (Curvularia) Negative    42. Mucor Plumbeus Negative    43. Fusarium Moniliforme Negative    44. Aureobasidium Pullulans (pullulara) Negative    45. Rhizopus Oryzae Negative    46. Botrytis Cinera Negative    47. Epicoccum Nigrum Negative    48. Phoma Betae Negative    49. Dust Mite Mix Negative    50. Cat Hair 10,000 BAU/ml Negative    51.  Dog Epithelia Negative    52. Mixed Feathers Negative    53. Horse Epithelia Negative    54. Cockroach, German Negative    55. Tobacco Leaf Negative             Intradermal - 10/14/23 1424     Time Antigen Placed 1430    Allergen Manufacturer Floyd Hutchinson    Location Arm    Number of Test 16    Control Negative    Bahia 3+    [place] 3+    Johnson Negative    7 Grass 2+    Ragweed Mix 2+    Weed Mix 3+    Tree Mix Negative    Mold 1 Negative    Mold 2 Negative    Mold 3 Negative    Mold 4 Negative    Mite Mix Negative    Cat Negative    Dog Negative    Cockroach Negative             Reducing Pollen Exposure  The American Academy of Allergy, Asthma and Immunology suggests the following steps to reduce your exposure to pollen during allergy seasons.    Do not hang sheets or clothing out to dry; pollen may collect on these items. Do not mow lawns or spend time around freshly cut grass; mowing stirs up pollen. Keep windows closed at night.  Keep car windows closed while driving. Minimize morning activities outdoors, a time when pollen counts are usually at their highest. Stay indoors as much as possible when pollen counts or humidity is high and on windy days when pollen tends to remain in the air longer. Use air conditioning when possible.  Many air conditioners have filters that trap the pollen spores. Use a HEPA room air filter to remove pollen form the indoor air you breathe.  Allergy Shots  Allergies are the result of a chain reaction that starts in the immune system. Your immune system controls how your body defends  itself. For instance, if you have an allergy to pollen, your immune system identifies pollen as an invader or allergen. Your immune system overreacts by producing antibodies called Immunoglobulin E (IgE). These antibodies travel to cells that release chemicals, causing an allergic reaction.  The concept behind  allergy immunotherapy, whether it is received in the form of shots or tablets, is that the immune system can be desensitized to specific allergens that trigger allergy symptoms. Although it requires time and patience, the payback can be long-term relief. Allergy injections contain a dilute solution of those substances that you are allergic to based upon your skin testing and allergy history.   How Do Allergy Shots Work?  Allergy shots work much like a vaccine. Your body responds to injected amounts of a particular allergen given in increasing doses, eventually developing a resistance and tolerance to it. Allergy shots can lead to decreased, minimal or no allergy symptoms.  There generally are two phases: build-up and maintenance. Build-up often ranges from three to six months and involves receiving injections with increasing amounts of the allergens. The shots are typically given once or twice a week, though more rapid build-up schedules are sometimes used.  The maintenance phase begins when the most effective dose is reached. This dose is different for each person, depending on how allergic you are and your response to the build-up injections. Once the maintenance dose is reached, there are longer periods between injections, typically two to four weeks.  Occasionally doctors give cortisone-type shots that can temporarily reduce allergy symptoms. These types of shots are different and should not be confused with allergy immunotherapy shots.  Who Can Be Treated with Allergy Shots?  Allergy shots may be a good treatment approach for people with allergic rhinitis (hay fever), allergic asthma,  conjunctivitis (eye allergy) or stinging insect allergy.   Before deciding to begin allergy shots, you should consider:   The length of allergy season and the severity of your symptoms  Whether medications and/or changes to your environment can control your symptoms  Your desire to avoid long-term medication use  Time: allergy immunotherapy requires a major time commitment  Cost: may vary depending on your insurance coverage  Allergy shots for children age 26 and older are effective and often well tolerated. They might prevent the onset of new allergen sensitivities or the progression to asthma.  Allergy shots are not started on patients who are pregnant but can be continued on patients who become pregnant while receiving them. In some patients with other medical conditions or who take certain common medications, allergy shots may be of risk. It is important to mention other medications you talk to your allergist.   What are the two types of build-ups offered:   RUSH or Rapid Desensitization -- one day of injections lasting from 8:30-4:30pm, injections every 1 hour.  Approximately half of the build-up process is completed in that one day.  The following week, normal build-up is resumed, and this entails ~16 visits either weekly or twice weekly, until reaching your "maintenance dose" which is continued weekly until eventually getting spaced out to every month for a duration of 3 to 5 years. The regular build-up appointments are nurse visits where the injections are administered, followed by required monitoring for 30 minutes.    Traditional build-up -- weekly visits for 6 -12 months until reaching "maintenance dose", then continue weekly until eventually spacing out to every 4 weeks as above. At these appointments, the injections are administered, followed by required monitoring for 30 minutes.     Either way is acceptable, and both are equally effective. With the rush protocol, the advantage  is that less time is spent here for injections overall AND you would also reach maintenance dosing faster (which is when the clinical benefit  starts to become more apparent). Not everyone is a candidate for rapid desensitization.   IF we proceed with the RUSH protocol, there are premedications which must be taken the day before and the day after the rush only (this includes antihistamines, steroids, and Singulair).  After the rush day, no prednisone or Singulair is required, and we just recommend antihistamines taken on your injection day.  What Is An Estimate of the Costs?  If you are interested in starting allergy injections, please check with your insurance company about your coverage for both allergy vial sets and allergy injections.  Please do so prior to making the appointment to start injections.  The following are CPT codes to give to your insurance company. These are the amounts we BILL to the insurance company, but the amount YOU WILL PAY and WE RECEIVE IS SUBSTANTIALLY LESS and depends on the contracts we have with different insurance companies.   Amount Billed to Insurance One allergy vial set  CPT 95165   $ 1200     One injection   CPT 95115   $ 35  RUSH (Rapid Desensitization) CPT 95180 x 8 hours  $500/hour  Regarding the allergy injections, your co-pay may or may not apply with each injection, so please confirm this with your insurance company. When you start allergy injections, 1 or 2 sets of vials are made based on your allergies.  Not all patients can be on one set of vials. A set of vials lasts 6 months to a year depending on how quickly you can proceed with your build-up of your allergy injections. Vials are personalized for each patient depending on their specific allergens.  How often are allergy injection given during the build-up period?   Injections are given at least weekly during the build-up period until your maintenance dose is achieved. Per the doctor's discretion, you  may have the option of getting allergy injections two times per week during the build-up period. However, there must be at least 48 hours between injections. The build-up period is usually completed within 6-12 months depending on your ability to schedule injections and for adjustments for reactions. When maintenance dose is reached, your injection schedule is gradually changed to every two weeks and later to every three weeks. Injections will then continue every 4 weeks. Usually, injections are continued for a total of 3-5 years.   When Will I Feel Better?  Some may experience decreased allergy symptoms during the build-up phase. For others, it may take as long as 12 months on the maintenance dose. If there is no improvement after a year of maintenance, your allergist will discuss other treatment options with you.  If you aren't responding to allergy shots, it may be because there is not enough dose of the allergen in your vaccine or there are missing allergens that were not identified during your allergy testing. Other reasons could be that there are high levels of the allergen in your environment or major exposure to non-allergic triggers like tobacco smoke.  What Is the Length of Treatment?  Once the maintenance dose is reached, allergy shots are generally continued for three to five years. The decision to stop should be discussed with your allergist at that time. Some people may experience a permanent reduction of allergy symptoms. Others may relapse and a longer course of allergy shots can be considered.  What Are the Possible Reactions?  The two types of adverse reactions that can occur with allergy shots are local and systemic. Common  local reactions include very mild redness and swelling at the injection site, which can happen immediately or several hours after. Report a delayed reaction from your last injection. These include arm swelling or runny nose, watery eyes or cough that occurs within  12-24 hours after injection. A systemic reaction, which is less common, affects the entire body or a particular body system. They are usually mild and typically respond quickly to medications. Signs include increased allergy symptoms such as sneezing, a stuffy nose or hives.   Rarely, a serious systemic reaction called anaphylaxis can develop. Symptoms include swelling in the throat, wheezing, a feeling of tightness in the chest, nausea or dizziness. Most serious systemic reactions develop within 30 minutes of allergy shots. This is why it is strongly recommended you wait in your doctor's office for 30 minutes after your injections. Your allergist is trained to watch for reactions, and his or her staff is trained and equipped with the proper medications to identify and treat them.   Report to the nurse immediately if you experience any of the following symptoms: swelling, itching or redness of the skin, hives, watery eyes/nose, breathing difficulty, excessive sneezing, coughing, stomach pain, diarrhea, or light headedness. These symptoms may occur within 15-20 minutes after injection and may require medication.   Who Should Administer Allergy Shots?  The preferred location for receiving shots is your prescribing allergist's office. Injections can sometimes be given at another facility where the physician and staff are trained to recognize and treat reactions, and have received instructions by your prescribing allergist.  What if I am late for an injection?   Injection dose will be adjusted depending upon how many days or weeks you are late for your injection.   What if I am sick?   Please report any illness to the nurse before receiving injections. She may adjust your dose or postpone injections depending on your symptoms. If you have fever, flu, sinus infection or chest congestion it is best to postpone allergy injections until you are better. Never get an allergy injection if your asthma is causing  you problems. If your symptoms persist, seek out medical care to get your health problem under control.  What If I am or Become Pregnant:  Women that become pregnant should schedule an appointment with [facility] before receiving any further allergy injections.

## 2023-10-14 NOTE — Progress Notes (Unsigned)
  Start: *** end: *** Patient is here today *** Patient would like to learn *** Patient lives with ***.  *** shopping and cooking.  History includes:  *** Medications include:  *** Labs noted:  ***  Lab Results  Component Value Date   HGBA1C 7.0 (H) 06/17/2023   Lab Results  Component Value Date   CHOL 132 06/17/2023   HDL 50 06/17/2023   LDLCALC 63 06/17/2023   TRIG 100 06/17/2023   CHOLHDL 2.6 06/17/2023

## 2023-10-15 ENCOUNTER — Other Ambulatory Visit: Payer: Self-pay | Admitting: *Deleted

## 2023-10-15 ENCOUNTER — Encounter: Payer: Self-pay | Admitting: Allergy & Immunology

## 2023-10-15 DIAGNOSIS — S85001D Unspecified injury of popliteal artery, right leg, subsequent encounter: Secondary | ICD-10-CM

## 2023-10-19 ENCOUNTER — Ambulatory Visit
Admission: RE | Admit: 2023-10-19 | Discharge: 2023-10-19 | Disposition: A | Discharge status: Home / Self Care | Source: Ambulatory Visit | Attending: Family Medicine | Admitting: Family Medicine

## 2023-10-19 DIAGNOSIS — Z1231 Encounter for screening mammogram for malignant neoplasm of breast: Secondary | ICD-10-CM

## 2023-10-20 ENCOUNTER — Encounter: Attending: Nurse Practitioner | Admitting: Dietician

## 2023-10-20 DIAGNOSIS — E119 Type 2 diabetes mellitus without complications: Secondary | ICD-10-CM | POA: Insufficient documentation

## 2023-10-20 DIAGNOSIS — E7800 Pure hypercholesterolemia, unspecified: Secondary | ICD-10-CM | POA: Diagnosis present

## 2023-10-20 DIAGNOSIS — K219 Gastro-esophageal reflux disease without esophagitis: Secondary | ICD-10-CM | POA: Insufficient documentation

## 2023-10-20 NOTE — Patient Instructions (Signed)
 Continue physical activity participation aiming for 30-60 minutes daily as tolerated  Aim for balanced meals using the plate planner on a schedule

## 2023-10-27 ENCOUNTER — Ambulatory Visit (HOSPITAL_BASED_OUTPATIENT_CLINIC_OR_DEPARTMENT_OTHER)
Admission: RE | Admit: 2023-10-27 | Discharge: 2023-10-27 | Disposition: A | Discharge status: Home / Self Care | Payer: Medicare PPO | Source: Ambulatory Visit | Attending: Vascular Surgery | Admitting: Vascular Surgery

## 2023-10-27 ENCOUNTER — Ambulatory Visit (HOSPITAL_COMMUNITY)
Admission: RE | Admit: 2023-10-27 | Discharge: 2023-10-27 | Disposition: A | Discharge status: Home / Self Care | Payer: Medicare PPO | Source: Ambulatory Visit | Attending: Vascular Surgery | Admitting: Vascular Surgery

## 2023-10-27 ENCOUNTER — Ambulatory Visit: Payer: Medicare PPO | Admitting: Physician Assistant

## 2023-10-27 VITALS — BP 137/98 | HR 88 | Temp 98.1°F | Wt 278.9 lb

## 2023-10-27 DIAGNOSIS — S85001D Unspecified injury of popliteal artery, right leg, subsequent encounter: Secondary | ICD-10-CM | POA: Insufficient documentation

## 2023-10-27 LAB — VAS US ABI WITH/WO TBI
Left ABI: 1.15
Right ABI: 1.21

## 2023-10-27 NOTE — Progress Notes (Signed)
 Office Note   History of Present Illness   [name] is a 78 y.o. (August 28, 1945) female who presents for surveillance of PAD.  She has a history of right above-knee to below-knee popliteal artery bypass with greater saphenous vein on 05/17/2017 by Dr. Vikki Graves.  This was done for acute right lower extremity ischemia following a knee dislocation after a fall.  She returns today for follow-up.  She says that she is doing well.  She denies any claudication, rest pain, or tissue loss.  She does have chronic bilateral lower extremity swelling, right greater than left.  She wears daily thigh-high compression stockings, which controls her leg swelling.   Current Outpatient Medications  Medication Sig Dispense Refill   Ascorbic Acid (VITAMIN C) 1000 MG tablet Take 1,000 mg by mouth daily.     aspirin  EC 81 MG tablet Take 81 mg by mouth daily.      Biotin 5000 MCG TABS Take 5,000 mcg by mouth daily.     Calcium  Carb-Cholecalciferol (CALCIUM  + D3 PO) Take 1 tablet by mouth daily.     conjugated estrogens  (PREMARIN ) vaginal cream 1/2 gram vaginally twice weekly 30 g 1   COVID-19 mRNA bivalent vaccine, Pfizer, (PFIZER COVID-19 VAC BIVALENT) injection Inject into the muscle. 0.3 mL 0   diltiazem  (CARDIZEM  CD) 240 MG 24 hr capsule TAKE 1 CAPSULE (240 MG TOTAL) BY MOUTH DAILY. 90 capsule 3   docusate sodium  (COLACE) 100 MG capsule Take 1 capsule (100 mg total) by mouth 2 (two) times daily. 10 capsule 0   ELIQUIS  5 MG TABS tablet TAKE 1 TABLET BY MOUTH 2 TIMES DAILY. 60 tablet 5   famotidine  (PEPCID ) 20 MG tablet Take 20 mg by mouth daily.     fluticasone  (FLONASE ) 50 MCG/ACT nasal spray Place 1-2 sprays into both nostrils at bedtime as needed for allergies.     levocetirizine (XYZAL ) 5 MG tablet Take 1 tablet (5 mg total) by mouth every evening. 90 tablet 3   loratadine  (CLARITIN ) 10 MG tablet Take 10 mg by mouth daily.     metformin (FORTAMET) 1000 MG (OSM) 24 hr tablet Take 1,000 mg by mouth  daily with breakfast. 500 mg with dinner     Multiple Vitamin (MULTIVITAMIN WITH MINERALS) TABS tablet Take 1 tablet by mouth daily.     nystatin cream (MYCOSTATIN) Apply 1 Application topically daily as needed (yeast).     PATADAY  0.2 % SOLN Place 1 drop into both eyes daily.   4   potassium chloride  SA (KLOR-CON  M) 20 MEQ tablet TAKE 1 TABLET (20 MEQ TOTAL) BY MOUTH DAILY. 90 tablet 3   pravastatin  (PRAVACHOL ) 80 MG tablet TAKE 1 TABLET (80 MG TOTAL) BY MOUTH EVERY EVENING. REPLACES CRESTOR  (ROSUVASTATIN ) 90 tablet 3   propranolol  (INDERAL ) 10 MG tablet Take 1 tablet (10 mg total) by mouth 4 (four) times daily as needed (palpitations or fast heart rate). 60 tablet 6   psyllium (REGULOID) 0.52 g capsule Take 2.6 g by mouth 4 (four) times daily.     triamterene -hydrochlorothiazide  (MAXZIDE ) 75-50 MG tablet Take 1 tablet by mouth daily.     No current facility-administered medications for this visit.    REVIEW OF SYSTEMS (negative unless checked):   Cardiac:  []  Chest pain or chest pressure? []  Shortness of breath upon activity? []  Shortness of breath when lying flat? []  Irregular heart rhythm?  Vascular:  []  Pain in calf, thigh, or hip brought on by walking? []  Pain in feet  at night that wakes you up from your sleep? []  Blood clot in your veins? [x]  Leg swelling?  Pulmonary:  []  Oxygen at home? []  Productive cough? []  Wheezing?  Neurologic:  []  Sudden weakness in arms or legs? []  Sudden numbness in arms or legs? []  Sudden onset of difficult speaking or slurred speech? []  Temporary loss of vision in one eye? []  Problems with dizziness?  Gastrointestinal:  []  Blood in stool? []  Vomited blood?  Genitourinary:  []  Burning when urinating? []  Blood in urine?  Psychiatric:  []  Major depression  Hematologic:  []  Bleeding problems? []  Problems with blood clotting?  Dermatologic:  []  Rashes or ulcers?  Constitutional:  []  Fever or chills?  Ear/Nose/Throat:  []   Change in hearing? []  Nose bleeds? []  Sore throat?  Musculoskeletal:  []  Back pain? []  Joint pain? []  Muscle pain?   Physical Examination    Vitals:   10/27/23 1225  BP: (!) 137/98  Pulse: 88  Temp: 98.1 F (36.7 C)  TempSrc: Temporal  SpO2: 92%  Weight: 278 lb 14.4 oz (126.5 kg)   Body mass index is 43.26 kg/m.  General:  WDWN in NAD; vital signs documented above Gait: Not observed HENT: WNL, normocephalic Pulmonary: normal non-labored breathing , without rales, rhonchi,  wheezing Cardiac: regular Abdomen: soft, NT, no masses Skin: without rashes Vascular Exam/Pulses: 2+ DP/PT pulses Extremities: without ischemic changes, without gangrene , without cellulitis; without open wounds;  Musculoskeletal: no muscle wasting or atrophy  Neurologic: A&O X 3;  No focal weakness or paresthesias are detected Psychiatric:  The pt has Normal affect.  Non-Invasive Vascular Imaging ABI (10/27/2023) R:  ABI: 1.21 (1.01),  PT: tri DP: tri TBI: 1.01 L:  ABI: 1.15 (0.93),  PT: tri DP: tri TBI: 1.13  RLE Bypass Duplex (10/27/2023) Patent above-knee to below-knee popliteal artery bypass without stenosis.  Velocities less than 40 cm/s in the proximal portion of the graft with maintained triphasic flow. Lowest velocity is 37 cm/s   Medical Decision Making   [name] is a 78 y.o. female who presents for surveillance of PAD  Based on the patient's vascular studies, her ABIs are essentially unchanged from her last visit.  Her right ABI is 1.21 and left ABI is 1.15 RLE duplex demonstrates a patent above-knee to below-knee popliteal artery bypass without stenosis.  There is an area of reduced velocities in the proximal portion of the graft, at 37 cm/s.  This is essentially unchanged from her prior ultrasound last year, with lowest velocities at 35 cm/s She denies any claudication, rest pain, or tissue loss.  She has 2+ DP pulses bilaterally Given that the velocities within  her right lower extremity bypass graft have not changed, I do not believe she requires intervention at this time.  We can plan for follow-up in 1 year with repeat RLE bypass graft duplex and ABIs   [name] Finlay PA-C [facility] Office: [phone]  Call MD: Fulton Job

## 2023-11-22 ENCOUNTER — Other Ambulatory Visit: Payer: Self-pay | Admitting: Cardiovascular Disease

## 2023-11-22 DIAGNOSIS — I480 Paroxysmal atrial fibrillation: Secondary | ICD-10-CM

## 2023-11-22 NOTE — Telephone Encounter (Signed)
 Prescription refill request for Eliquis  received. Indication:afib Last office visit:1/25 Scr:0.91  1/25 Age: 78 Weight:126.5  kg  Prescription refilled

## 2023-12-22 ENCOUNTER — Ambulatory Visit: Admitting: Dietician

## 2024-01-18 ENCOUNTER — Ambulatory Visit: Admitting: Allergy & Immunology

## 2024-03-01 ENCOUNTER — Telehealth: Payer: Self-pay

## 2024-03-01 NOTE — Telephone Encounter (Signed)
 Pt called with c/o sudden onset of R calf pain. She thinks it may be mildly swollen and warm to touch; denies redness. She has called her PCP to obtain a DVT order and we will schedule once that is received.

## 2024-03-02 ENCOUNTER — Other Ambulatory Visit (HOSPITAL_COMMUNITY): Payer: Self-pay | Admitting: Vascular Surgery

## 2024-03-02 ENCOUNTER — Ambulatory Visit (HOSPITAL_COMMUNITY)
Admission: RE | Admit: 2024-03-02 | Discharge: 2024-03-02 | Disposition: A | Discharge status: Home / Self Care | Source: Ambulatory Visit | Attending: Vascular Surgery | Admitting: Vascular Surgery

## 2024-03-02 ENCOUNTER — Telehealth: Payer: Self-pay

## 2024-03-02 DIAGNOSIS — R52 Pain, unspecified: Secondary | ICD-10-CM | POA: Insufficient documentation

## 2024-03-02 NOTE — Telephone Encounter (Signed)
 Triage: -in response to pt requesting DVT ultrasound, I called the PCP office to obtain order and documents and confirmed recent fax was sent.  PCP's office stated she did not allow assessment of her leg but anxious and wanted an ultrasound. -returned call to pt who stated she had been very busy this morning, went to breakfast, went to a store, and working in the shed trying to transplant a plant and then began to feel discomfort near her knee but had her compression stockings on so does not know what her leg looks like underneath. -pt denies any trauma, discoloration, or coolness to her leg and does not know if it's swollen or red.  She explains she once had a clot in her let years ago but she had knee surgeries and other work on her leg including a graft.  -advised pt to elevate her leg; she said, above the level of my heart?, and I confirmed.  Advised pt to make sure she removes her stockings at night and take note of the current appearance and monitor for any worsening pain, discoloration or coolness and advised her to report to the ED if that occurs.   -available appts in the morning.  Will make arrangements for her study in the am

## 2024-03-28 ENCOUNTER — Other Ambulatory Visit

## 2024-05-15 ENCOUNTER — Other Ambulatory Visit: Payer: Self-pay | Admitting: Physician Assistant

## 2024-05-15 DIAGNOSIS — I480 Paroxysmal atrial fibrillation: Secondary | ICD-10-CM

## 2024-05-15 MED ORDER — APIXABAN 5 MG PO TABS: 5.0000 mg | ORAL_TABLET | Freq: Two times a day (BID) | ORAL | 5 refills | Status: AC

## 2024-05-15 NOTE — Telephone Encounter (Signed)
 Prescription refill request for Eliquis  received. Indication: PAF Last office visit: 06/22/23  P Nahser MD Scr: 0.91 on 06/16/24  Epic Age: 78 Weight: 129.1kg  Based on above findings Eliquis  5mg  twice daily is the appropriate dose.  Refill approved.

## 2024-06-05 ENCOUNTER — Other Ambulatory Visit: Payer: Self-pay

## 2024-06-06 ENCOUNTER — Ambulatory Visit (HOSPITAL_BASED_OUTPATIENT_CLINIC_OR_DEPARTMENT_OTHER)
Admission: RE | Admit: 2024-06-06 | Discharge: 2024-06-06 | Disposition: A | Discharge status: Home / Self Care | Source: Ambulatory Visit | Attending: Family Medicine | Admitting: Family Medicine

## 2024-06-06 DIAGNOSIS — M8589 Other specified disorders of bone density and structure, multiple sites: Secondary | ICD-10-CM | POA: Diagnosis not present

## 2024-06-06 DIAGNOSIS — Z780 Asymptomatic menopausal state: Secondary | ICD-10-CM | POA: Diagnosis not present

## 2024-06-06 DIAGNOSIS — Z13820 Encounter for screening for osteoporosis: Secondary | ICD-10-CM | POA: Insufficient documentation

## 2024-06-07 MED ORDER — PRAVASTATIN SODIUM 80 MG PO TABS: 80.0000 mg | ORAL_TABLET | Freq: Every day | ORAL | 0 refills | Status: AC

## 2024-06-15 ENCOUNTER — Encounter: Payer: Self-pay | Admitting: Cardiology

## 2024-06-28 ENCOUNTER — Other Ambulatory Visit: Payer: Self-pay | Admitting: Cardiology

## 2024-07-05 NOTE — Telephone Encounter (Signed)
 In accordance with refill protocols, please review and address the following requirements before this medication refill can be authorized:  Labs

## 2024-08-31 ENCOUNTER — Ambulatory Visit: Admitting: Cardiology
